# Patient Record
Sex: Female | Born: 1950
Health system: Southern US, Community
[De-identification: ages and names within clinical notes are randomized; demographics above are authoritative.]

## PROBLEM LIST (undated history)

## (undated) DIAGNOSIS — F32A Depression, unspecified: Secondary | ICD-10-CM

## (undated) DIAGNOSIS — Z87898 Personal history of other specified conditions: Secondary | ICD-10-CM

## (undated) DIAGNOSIS — T7840XA Allergy, unspecified, initial encounter: Secondary | ICD-10-CM

## (undated) DIAGNOSIS — D693 Immune thrombocytopenic purpura: Secondary | ICD-10-CM

## (undated) DIAGNOSIS — L508 Other urticaria: Secondary | ICD-10-CM

## (undated) DIAGNOSIS — I10 Essential (primary) hypertension: Secondary | ICD-10-CM

## (undated) DIAGNOSIS — F329 Major depressive disorder, single episode, unspecified: Secondary | ICD-10-CM

## (undated) DIAGNOSIS — S0990XA Unspecified injury of head, initial encounter: Secondary | ICD-10-CM

## (undated) HISTORY — DX: Immune thrombocytopenic purpura: D69.3

## (undated) HISTORY — PX: CATARACT EXTRACTION, BILATERAL: SHX1313

## (undated) HISTORY — DX: Unspecified injury of head, initial encounter: S09.90XA

## (undated) HISTORY — DX: Personal history of other specified conditions: Z87.898

## (undated) HISTORY — DX: Essential (primary) hypertension: I10

## (undated) HISTORY — DX: Allergy, unspecified, initial encounter: T78.40XA

---
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
## 1994-01-15 HISTORY — PX: CYST EXCISION: SHX5701

## 2002-01-15 DIAGNOSIS — F39 Unspecified mood [affective] disorder: Secondary | ICD-10-CM | POA: Insufficient documentation

## 2003-01-16 HISTORY — PX: CHOLECYSTECTOMY: SHX55

## 2003-05-20 ENCOUNTER — Other Ambulatory Visit: Payer: Self-pay

## 2003-11-23 ENCOUNTER — Ambulatory Visit: Payer: Self-pay | Admitting: Internal Medicine

## 2003-12-16 ENCOUNTER — Ambulatory Visit: Payer: Self-pay | Admitting: Internal Medicine

## 2004-01-16 DIAGNOSIS — I10 Essential (primary) hypertension: Secondary | ICD-10-CM | POA: Insufficient documentation

## 2004-10-16 ENCOUNTER — Ambulatory Visit: Payer: Self-pay | Admitting: Family Medicine

## 2004-10-24 DIAGNOSIS — G4733 Obstructive sleep apnea (adult) (pediatric): Secondary | ICD-10-CM | POA: Insufficient documentation

## 2005-01-15 DIAGNOSIS — Z87898 Personal history of other specified conditions: Secondary | ICD-10-CM

## 2005-01-15 HISTORY — DX: Personal history of other specified conditions: Z87.898

## 2005-02-23 ENCOUNTER — Ambulatory Visit: Payer: Self-pay | Admitting: Internal Medicine

## 2005-03-15 ENCOUNTER — Ambulatory Visit: Payer: Self-pay | Admitting: Internal Medicine

## 2005-10-08 DIAGNOSIS — D693 Immune thrombocytopenic purpura: Secondary | ICD-10-CM | POA: Insufficient documentation

## 2005-10-08 DIAGNOSIS — D696 Thrombocytopenia, unspecified: Secondary | ICD-10-CM | POA: Insufficient documentation

## 2005-10-08 DIAGNOSIS — N959 Unspecified menopausal and perimenopausal disorder: Secondary | ICD-10-CM | POA: Insufficient documentation

## 2005-10-08 DIAGNOSIS — R000 Tachycardia, unspecified: Secondary | ICD-10-CM | POA: Insufficient documentation

## 2007-01-21 DIAGNOSIS — Z8249 Family history of ischemic heart disease and other diseases of the circulatory system: Secondary | ICD-10-CM | POA: Insufficient documentation

## 2007-01-25 DIAGNOSIS — M19049 Primary osteoarthritis, unspecified hand: Secondary | ICD-10-CM | POA: Insufficient documentation

## 2007-03-14 ENCOUNTER — Ambulatory Visit: Payer: Self-pay | Admitting: Internal Medicine

## 2007-06-11 DIAGNOSIS — E785 Hyperlipidemia, unspecified: Secondary | ICD-10-CM | POA: Insufficient documentation

## 2007-06-11 DIAGNOSIS — E881 Lipodystrophy, not elsewhere classified: Secondary | ICD-10-CM | POA: Insufficient documentation

## 2007-07-08 DIAGNOSIS — K120 Recurrent oral aphthae: Secondary | ICD-10-CM | POA: Insufficient documentation

## 2007-12-26 ENCOUNTER — Ambulatory Visit: Payer: Self-pay | Admitting: Family Medicine

## 2014-06-18 ENCOUNTER — Other Ambulatory Visit: Payer: Self-pay | Admitting: Family Medicine

## 2014-06-18 ENCOUNTER — Telehealth: Payer: Self-pay | Admitting: Family Medicine

## 2014-06-19 NOTE — Telephone Encounter (Signed)
Simvastatin ordered.

## 2014-06-29 ENCOUNTER — Encounter: Payer: Self-pay | Admitting: Family Medicine

## 2014-06-29 ENCOUNTER — Ambulatory Visit (INDEPENDENT_AMBULATORY_CARE_PROVIDER_SITE_OTHER): Payer: Commercial Managed Care - PPO | Admitting: Family Medicine

## 2014-06-29 VITALS — BP 120/70 | HR 78 | Temp 98.5°F | Resp 16 | Wt 163.0 lb

## 2014-06-29 DIAGNOSIS — G4700 Insomnia, unspecified: Secondary | ICD-10-CM | POA: Insufficient documentation

## 2014-06-29 DIAGNOSIS — M26629 Arthralgia of temporomandibular joint, unspecified side: Secondary | ICD-10-CM

## 2014-06-29 DIAGNOSIS — M2662 Arthralgia of temporomandibular joint: Secondary | ICD-10-CM

## 2014-06-29 DIAGNOSIS — J309 Allergic rhinitis, unspecified: Secondary | ICD-10-CM | POA: Insufficient documentation

## 2014-06-29 DIAGNOSIS — R609 Edema, unspecified: Secondary | ICD-10-CM | POA: Diagnosis not present

## 2014-06-29 DIAGNOSIS — D696 Thrombocytopenia, unspecified: Secondary | ICD-10-CM | POA: Diagnosis not present

## 2014-06-29 DIAGNOSIS — J302 Other seasonal allergic rhinitis: Secondary | ICD-10-CM | POA: Insufficient documentation

## 2014-06-29 NOTE — Progress Notes (Signed)
Patient: Julia Garner Female    DOB: [birth year]   64 y.o.   MRN: [id number] Visit Date: 06/29/2014  Today's Provider: Mila Merry, MD   Chief Complaint  Patient presents with  . Jaw Pain    x 5 months   Subjective:    HPI    Jaw Pain  Patient comes in today for an evaluation of Left side jaw pain. Patient states it has been occuring persistently for the past 5 months. Patient has been using Naproxen 500mg   2 pills twice daily to help with pain with little relief. Patient states she was previously seen by Anola Gurney PA- C in the past for the same jaw pain. She was also prescribed muscle relaxants which she states provide minimal relief and just make her sleepy. She has been to see her dentist and had Xrays with no abnormal findings by her report.    Swelling She reports her left lower leg has been swollen and discolored for several weeks, and has been very itchy.      Previous Medications   BIOFLAVONOID PRODUCTS (VITAMIN C PLUS) 500 MG TABS    Take 1 tablet by mouth daily.   GLUCOSAMINE HCL 500 MG TABS    Take 1 tablet by mouth daily.   IRBESARTAN-HYDROCHLOROTHIAZIDE (AVALIDE) 150-12.5 MG PER TABLET    Take 1 tablet by mouth daily.   LORATADINE-PSEUDOEPHEDRINE (CLARITIN-D 24-HOUR) 10-240 MG PER 24 HR TABLET    Take 1 tablet by mouth daily.   NAPROXEN (NAPROSYN) 500 MG TABLET    Take 1 tablet by mouth 2 (two) times daily.   OMEGA 3 1200 MG CAPS    Take 2 capsules by mouth daily.   SERTRALINE (ZOLOFT) 50 MG TABLET    Take 1.5 tablets by mouth every morning.   SIMVASTATIN (ZOCOR) 40 MG TABLET    TAKE ONE TABLET AT BEDTIME   TIZANIDINE (ZANAFLEX) 4 MG TABLET    Take 1 tablet by mouth every 6 (six) hours as needed.   TRAZODONE (DESYREL) 50 MG TABLET    Take 1-2 tablets by mouth at bedtime.    Review of Systems  Constitutional: Negative for fever and chills.  Respiratory: Negative for apnea, cough and shortness of breath.   Cardiovascular: Positive for leg swelling (right  leg).  Skin: Positive for color change (redness on the lower left leg).    History  Substance Use Topics  . Smoking status: Never Smoker   . Smokeless tobacco: Not on file  . Alcohol Use: No   Objective:   BP 120/70 mmHg  Pulse 78  Temp(Src) 98.5 F (36.9 C) (Oral)  Resp 16  Wt 163 lb (73.936 kg)  SpO2 98%  Physical Exam   General Appearance:    Alert, cooperative, no distress  ENT   Tender left TMJ pre-aurical   Eyes:    PERRL, conjunctiva/corneas clear, EOM's intact       Lungs:     Clear to auscultation bilaterally, respirations unlabored  Heart:    Regular rate and rhythm  Neurologic:   Awake, alert, oriented x 3. No apparent focal neurological           defect.   Skin   About 5cm by 12cm area of left anterior left with redish blue discoloration, fairly well demarcated with 1+ edema. No red streaks. Minimal tenderness. A few excoriations.        Assessment & Plan:     1. Thrombocytopenia She reports having had more bruising than  usual the last few months.  - CBC  2. Arthralgia of temporomandibular joint No significant relief from NSAIDs and muscle relaxers. She is interested in getting injections. Will refer to ENT for further evaluation.  - Ambulatory referral to ENT  3. Edema Unclear is this is a purely cutaneous finding, or a sign of a systemic condition. Check labs. Consider steroid cream if labs normal.  - TSH - Comprehensive metabolic panel

## 2014-06-30 LAB — CBC
HEMATOCRIT: 38.8 % (ref 34.0–46.6)
HEMOGLOBIN: 12.9 g/dL (ref 11.1–15.9)
MCH: 28.2 pg (ref 26.6–33.0)
MCHC: 33.2 g/dL (ref 31.5–35.7)
MCV: 85 fL (ref 79–97)
Platelets: 47 10*3/uL — CL (ref 150–379)
RBC: 4.58 x10E6/uL (ref 3.77–5.28)
RDW: 13.2 % (ref 12.3–15.4)
WBC: 8 10*3/uL (ref 3.4–10.8)

## 2014-06-30 LAB — COMPREHENSIVE METABOLIC PANEL
ALK PHOS: 59 IU/L (ref 39–117)
ALT: 21 IU/L (ref 0–32)
AST: 22 IU/L (ref 0–40)
Albumin/Globulin Ratio: 1.2 (ref 1.1–2.5)
Albumin: 4.1 g/dL (ref 3.6–4.8)
BUN/Creatinine Ratio: 14 (ref 11–26)
BUN: 11 mg/dL (ref 8–27)
Bilirubin Total: 0.3 mg/dL (ref 0.0–1.2)
CO2: 25 mmol/L (ref 18–29)
CREATININE: 0.76 mg/dL (ref 0.57–1.00)
Calcium: 9.1 mg/dL (ref 8.7–10.3)
Chloride: 100 mmol/L (ref 97–108)
GFR, EST AFRICAN AMERICAN: 98 mL/min/{1.73_m2} (ref >59)
GFR, EST NON AFRICAN AMERICAN: 85 mL/min/{1.73_m2} (ref >59)
GLOBULIN, TOTAL: 3.5 g/dL (ref 1.5–4.5)
Glucose: 106 mg/dL — ABNORMAL HIGH (ref 65–99)
POTASSIUM: 3.9 mmol/L (ref 3.5–5.2)
SODIUM: 139 mmol/L (ref 134–144)
Total Protein: 7.6 g/dL (ref 6.0–8.5)

## 2014-06-30 LAB — TSH: TSH: 1.66 u[IU]/mL (ref 0.450–4.500)

## 2014-07-01 ENCOUNTER — Telehealth: Payer: Self-pay | Admitting: *Deleted

## 2014-07-01 MED ORDER — PREDNISONE 10 MG PO TABS: ORAL_TABLET | ORAL | Status: DC

## 2014-07-01 NOTE — Telephone Encounter (Signed)
-----   Message from Malva Limes, MD sent at 06/30/2014  7:52 AM EDT ----- Platelets are pretty low at 47. Recommend she start back on 12 day prednisone taper. (10mg  tablets, 6 for 2 days, 5 for 2 days, 4 for 2 days, 3 for 2days, 2 for 2 days, and 1 for 2 days. #42) Other labs are not yet complete.

## 2014-07-01 NOTE — Telephone Encounter (Signed)
Rx sent to pharmacy. Patient wanted to know if you could give her a cream for her leg also?

## 2014-07-04 NOTE — Telephone Encounter (Signed)
i don't know of any creams that would help her leg. It should clear up on its own in a week or two. If not then she will need referral to dermatology.

## 2014-07-05 NOTE — Telephone Encounter (Signed)
Patient notified. Patient stated that the ENT she was referred to told her that they can't see her for jaw pain. ENT would have to refer her to someone else for the jaw pain. Patient stated that she would rather [facility] refer her to someone for her jaw instead of ENT.

## 2014-07-05 NOTE — Telephone Encounter (Signed)
LMOVM for pt to return call 

## 2014-07-06 NOTE — Telephone Encounter (Signed)
i don't know anyone else to refer to. She needs to find out from ENT best course of action. They will need to arrange referral if that is what is necessary.

## 2014-07-06 NOTE — Telephone Encounter (Signed)
Pt advised as directed below.   Thanks,   -Gracey Tolle  

## 2014-12-21 ENCOUNTER — Other Ambulatory Visit: Payer: Self-pay | Admitting: Family Medicine

## 2014-12-22 ENCOUNTER — Ambulatory Visit (INDEPENDENT_AMBULATORY_CARE_PROVIDER_SITE_OTHER): Payer: Commercial Managed Care - PPO

## 2014-12-22 DIAGNOSIS — Z23 Encounter for immunization: Secondary | ICD-10-CM

## 2015-03-08 ENCOUNTER — Other Ambulatory Visit: Payer: Self-pay | Admitting: Family Medicine

## 2015-03-21 ENCOUNTER — Other Ambulatory Visit: Payer: Self-pay | Admitting: Family Medicine

## 2015-09-24 ENCOUNTER — Other Ambulatory Visit: Payer: Self-pay | Admitting: Family Medicine

## 2015-12-16 ENCOUNTER — Encounter: Payer: Commercial Managed Care - PPO | Admitting: Family Medicine

## 2015-12-21 ENCOUNTER — Encounter: Payer: Self-pay | Admitting: Family Medicine

## 2015-12-21 ENCOUNTER — Ambulatory Visit (INDEPENDENT_AMBULATORY_CARE_PROVIDER_SITE_OTHER): Payer: BLUE CROSS/BLUE SHIELD | Admitting: Family Medicine

## 2015-12-21 ENCOUNTER — Other Ambulatory Visit: Payer: Self-pay | Admitting: Family Medicine

## 2015-12-21 VITALS — BP 118/68 | HR 72 | Temp 98.4°F | Resp 16 | Ht 62.0 in | Wt 163.0 lb

## 2015-12-21 DIAGNOSIS — I10 Essential (primary) hypertension: Secondary | ICD-10-CM

## 2015-12-21 DIAGNOSIS — E785 Hyperlipidemia, unspecified: Secondary | ICD-10-CM | POA: Diagnosis not present

## 2015-12-21 DIAGNOSIS — Z0000 Encounter for general adult medical examination without abnormal findings: Secondary | ICD-10-CM

## 2015-12-21 DIAGNOSIS — Z23 Encounter for immunization: Secondary | ICD-10-CM | POA: Diagnosis not present

## 2015-12-21 DIAGNOSIS — D696 Thrombocytopenia, unspecified: Secondary | ICD-10-CM | POA: Diagnosis not present

## 2015-12-21 DIAGNOSIS — F32A Depression, unspecified: Secondary | ICD-10-CM

## 2015-12-21 DIAGNOSIS — F329 Major depressive disorder, single episode, unspecified: Secondary | ICD-10-CM

## 2015-12-21 MED ORDER — LORATADINE-PSEUDOEPHEDRINE ER 10-240 MG PO TB24: 1.0000 | ORAL_TABLET | Freq: Every day | ORAL | 5 refills | Status: DC | PRN

## 2015-12-21 NOTE — Progress Notes (Signed)
Patient: Julia Garner, Female    DOB: 09/13/1950, 65 y.o.   MRN: [id number] Visit Date: 12/21/2015  Today's Provider: Mila Merryonald Meyer Dockery, MD   Chief Complaint  Patient presents with  . Annual Exam   Subjective:    Annual physical exam Julia Garner is a 65 y.o. female who presents today for health maintenance and complete physical. She feels well. She reports exercising not regularly. She reports she is sleeping fairly well. She is a previous patient of Dr. Christen BameValen, but is due for breast exam and mammogram.   She feels well. 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     Hypertension, follow-up:  BP Readings from Last 3 Encounters:  12/21/15 118/68  06/29/14 120/70    She was last seen for hypertension 2 years ago.  BP at that visit was 120/70. Management since that visit includes no changes. She reports good compliance with treatment. She is not having side effects.  She is not exercising. She is adherent to low salt diet.   Outside blood pressures are not being checked. Patient denies chest pain, chest pressure/discomfort, irregular heart beat, lower extremity edema and tachypnea.   Cardiovascular risk factors include dyslipidemia.     Weight trend: stable Wt Readings from Last 3 Encounters:  12/21/15 163 lb (73.9 kg)  06/29/14 163 lb (73.9 kg)    Current diet: well balanced    Lipid/Cholesterol, Follow-up:   Last seen for this2 years ago.  Management changes since that visit include no changes. . Last Lipid Panel: No results found for: CHOL, TRIG, HDL, CHOLHDL, VLDL, LDLCALC, LDLDIRECT  Risk factors for vascular disease include hypertension  She reports good compliance with treatment. She is not having side effects.  Current symptoms include none and have been stable. Weight trend: stable Prior visit with dietician: no Current diet: well balanced Current exercise: none  Wt Readings from Last 3  Encounters:  12/21/15 163 lb (73.9 kg)  06/29/14 163 lb (73.9 kg)      Review of Systems  Constitutional: Negative.   HENT: Negative.   Eyes: Negative.   Respiratory: Negative.   Cardiovascular: Negative.   Gastrointestinal: Negative.   Endocrine: Negative.   Genitourinary: Negative.   Musculoskeletal: Negative.   Skin: Negative.   Allergic/Immunologic: Positive for environmental allergies.  Neurological: Negative.   Hematological: Negative.   Psychiatric/Behavioral: Negative.     Social History      She  reports that she has never smoked. She does not have any smokeless tobacco history on file. She reports that she does not drink alcohol or use drugs.       Social History   Social History  . Marital status: Married    Spouse name: N/A  . Number of children: 3  . Years of education: N/A   Occupational History  . Employed     Physical Therapist   Social History Main Topics  . Smoking status: Never Smoker  . Smokeless tobacco: None  . Alcohol use No  . Drug use: No  . Sexual activity: Not Asked   Other Topics Concern  . None   Social History Narrative  . None    No past medical history on file.   Patient Active Problem List   Diagnosis Date Noted  . Allergic rhinitis 06/29/2014  . Insomnia 06/29/2014  . Arthralgia of temporomandibular joint 06/29/2014  . Edema 06/29/2014  . Oral aphthae 07/08/2007  . Cutaneous lipodystrophy  06/11/2007  . Arthropathy of hand 01/25/2007  . Fam hx-ischem heart disease 01/21/2007  . Menopausal and postmenopausal disorder 10/08/2005  . Tachycardia 10/08/2005  . Thrombocytopenia (HCC) 10/08/2005  . Obstructive sleep apnea of adult 10/24/2004  . Essential (primary) hypertension 01/16/2004  . Depression 01/15/2002    Past Surgical History:  Procedure Laterality Date  . CESAREAN SECTION  1990  . CHOLECYSTECTOMY  2005  . CYST EXCISION  1996   Tracheal Cyst    Family History        Family Status  Relation Status   . Mother Deceased  . Father Deceased at age 63   Cause of death: MI        Her family history includes Heart attack in her father.     Allergies  Allergen Reactions  . Penicillins Itching    Hot flashes.  . Simcor  [Niacin-Simvastatin Er]     Flushing  . Tylenol  [Acetaminophen]      Current Outpatient Prescriptions:  .  Bioflavonoid Products (VITAMIN C PLUS) 500 MG TABS, Take 1 tablet by mouth daily., Disp: , Rfl:  .  Glucosamine HCl 500 MG TABS, Take 1 tablet by mouth daily., Disp: , Rfl:  .  irbesartan-hydrochlorothiazide (AVALIDE) 150-12.5 MG tablet, TAKE 1 TABLET EVERY DAY, Disp: 90 tablet, Rfl: 5 .  loratadine-pseudoephedrine (CLARITIN-D 24-HOUR) 10-240 MG per 24 hr tablet, Take 1 tablet by mouth daily., Disp: , Rfl:  .  Omega 3 1200 MG CAPS, Take 2 capsules by mouth daily., Disp: , Rfl:  .  sertraline (ZOLOFT) 50 MG tablet, TAKE ONE AND ONE-HALF TABLETS EVERY MORNING, Disp: 135 tablet, Rfl: 6 .  simvastatin (ZOCOR) 40 MG tablet, TAKE ONE TABLET AT BEDTIME, Disp: 90 tablet, Rfl: 4 .  naproxen (NAPROSYN) 500 MG tablet, Take 1 tablet by mouth 2 (two) times daily., Disp: , Rfl:  .  traZODone (DESYREL) 50 MG tablet, TAKE ONE OR TWO TABLETS AT BEDTIME, Disp: 180 tablet, Rfl: 4   Patient Care Team: Malva Limesonald E Tersea Aulds, MD as PCP - General (Family Medicine)      Objective:   Vitals: BP 118/68 (BP Location: Left Arm, Patient Position: Sitting, Cuff Size: Normal)   Pulse 72   Temp 98.4 F (36.9 C)   Resp 16   Ht 5\' 2"  (1.575 m)   Wt 163 lb (73.9 kg)   BMI 29.81 kg/m    Physical Exam   General Appearance:    Alert, cooperative, no distress, appears stated age  Head:    Normocephalic, without obvious abnormality, atraumatic  Eyes:    PERRL, conjunctiva/corneas clear, EOM's intact, fundi    benign, both eyes  Ears:    Normal TM's and external ear canals, both ears  Nose:   Nares normal, septum midline, mucosa normal, no drainage    or sinus tenderness  Throat:   Lips,  mucosa, and tongue normal; teeth and gums normal  Neck:   Supple, symmetrical, trachea midline, no adenopathy;    thyroid:  no enlargement/tenderness/nodules; no carotid   bruit or JVD  Back:     Symmetric, no curvature, ROM normal, no CVA tenderness  Lungs:     Clear to auscultation bilaterally, respirations unlabored  Chest Wall:    No tenderness or deformity   Heart:    Regular rate and rhythm, S1 and S2 normal, no murmur, rub   or gallop  Breast Exam:    refused  Abdomen:     Soft, non-tender, bowel sounds active all  four quadrants,    no masses, no organomegaly  Pelvic:    deferred  Extremities:   Extremities normal, atraumatic, no cyanosis or edema  Pulses:   2+ and symmetric all extremities  Skin:   Skin color, texture, turgor normal, no rashes or lesions  Lymph nodes:   Cervical, supraclavicular, and axillary nodes normal  Neurologic:   CNII-XII intact, normal strength, sensation and reflexes    throughout    Depression Screen No flowsheet data found.    Assessment & Plan:     Routine Health Maintenance and Physical Exam  Exercise Activities and Dietary recommendations Goals    None      Immunization History  Administered Date(s) Administered  . Influenza,inj,Quad PF,36+ Mos 12/22/2014  . Tdap 05/08/2011    Health Maintenance  Topic Date Due  . Hepatitis C Screening  1950-04-12  . HIV Screening  07/18/1967  . PAP SMEAR  07/17/1973  . MAMMOGRAM  07/18/2002  . COLONOSCOPY  07/18/2002  . ZOSTAVAX  07/17/2012  . INFLUENZA VACCINE  08/16/2015  . TETANUS/TDAP  05/07/2021     Discussed health benefits of physical activity, and encouraged her to engage in regular exercise appropriate for her age and condition.    1. Annual physical exam She is generally doing well with no complaints. Detailed discussion on benefits of routine screenings. She refused breast exam, mammogram, colonoscopy and fecal occult blood testing today.  - Comprehensive metabolic panel -  Lipid panel - CBC  2. Need for influenza vaccination  - Flu Vaccine QUAD 36+ mos PF IM (Fluarix & Fluzone Quad PF)  3. Essential (primary) hypertension Well controlled.  Continue current medications.      4. Thrombocytopenia (HCC)  - CBC  5. Depression, unspecified depression type Doing well on sertraline.   6. Hyperlipidemia, unspecified hyperlipidemia type She is tolerating simvastatin well with no adverse effects.   - Lipid panel   Mila Merry, MD  [facility]

## 2015-12-22 ENCOUNTER — Encounter: Payer: Self-pay | Admitting: Family Medicine

## 2015-12-22 LAB — COMPREHENSIVE METABOLIC PANEL
ALBUMIN: 4.1 g/dL (ref 3.6–4.8)
ALT: 28 IU/L (ref 0–32)
AST: 24 IU/L (ref 0–40)
Albumin/Globulin Ratio: 1 — ABNORMAL LOW (ref 1.2–2.2)
Alkaline Phosphatase: 77 IU/L (ref 39–117)
BUN / CREAT RATIO: 11 — AB (ref 12–28)
BUN: 10 mg/dL (ref 8–27)
Bilirubin Total: 0.4 mg/dL (ref 0.0–1.2)
CALCIUM: 9.4 mg/dL (ref 8.7–10.3)
CO2: 28 mmol/L (ref 18–29)
Chloride: 100 mmol/L (ref 96–106)
Creatinine, Ser: 0.9 mg/dL (ref 0.57–1.00)
GFR, EST AFRICAN AMERICAN: 79 mL/min/{1.73_m2} (ref >59)
GFR, EST NON AFRICAN AMERICAN: 68 mL/min/{1.73_m2} (ref >59)
Globulin, Total: 4.1 g/dL (ref 1.5–4.5)
Glucose: 82 mg/dL (ref 65–99)
POTASSIUM: 3.9 mmol/L (ref 3.5–5.2)
Sodium: 144 mmol/L (ref 134–144)
TOTAL PROTEIN: 8.2 g/dL (ref 6.0–8.5)

## 2015-12-22 LAB — CBC
Hematocrit: 41.8 % (ref 34.0–46.6)
Hemoglobin: 14.2 g/dL (ref 11.1–15.9)
MCH: 28.5 pg (ref 26.6–33.0)
MCHC: 34 g/dL (ref 31.5–35.7)
MCV: 84 fL (ref 79–97)
PLATELETS: 86 10*3/uL — AB (ref 150–379)
RBC: 4.99 x10E6/uL (ref 3.77–5.28)
RDW: 13.7 % (ref 12.3–15.4)
WBC: 9 10*3/uL (ref 3.4–10.8)

## 2015-12-22 LAB — LIPID PANEL
CHOL/HDL RATIO: 3.4 ratio (ref 0.0–4.4)
Cholesterol, Total: 131 mg/dL (ref 100–199)
HDL: 38 mg/dL — AB (ref >39)
LDL Calculated: 64 mg/dL (ref 0–99)
Triglycerides: 144 mg/dL (ref 0–149)
VLDL CHOLESTEROL CAL: 29 mg/dL (ref 5–40)

## 2016-01-17 ENCOUNTER — Telehealth: Payer: Self-pay | Admitting: Family Medicine

## 2016-03-20 ENCOUNTER — Other Ambulatory Visit: Payer: Self-pay | Admitting: Family Medicine

## 2016-04-04 ENCOUNTER — Encounter: Payer: Self-pay | Admitting: Family Medicine

## 2016-04-04 ENCOUNTER — Ambulatory Visit (INDEPENDENT_AMBULATORY_CARE_PROVIDER_SITE_OTHER): Payer: BLUE CROSS/BLUE SHIELD | Admitting: Family Medicine

## 2016-04-04 VITALS — BP 112/68 | HR 98 | Temp 98.2°F | Resp 16 | Wt 170.0 lb

## 2016-04-04 DIAGNOSIS — J301 Allergic rhinitis due to pollen: Secondary | ICD-10-CM

## 2016-04-04 MED ORDER — AZELASTINE HCL 0.1 % NA SOLN: 2.0000 | Freq: Two times a day (BID) | NASAL | 5 refills | Status: DC

## 2016-04-04 MED ORDER — MONTELUKAST SODIUM 10 MG PO TABS: 10.0000 mg | ORAL_TABLET | Freq: Every day | ORAL | 5 refills | Status: DC

## 2016-04-04 NOTE — Progress Notes (Signed)
Patient: Julia Garner Female    DOB: 1950-01-26   66 y.o.   MRN: [id number] Visit Date: 04/04/2016  Today's Provider: Mila Merry, MD   Chief Complaint  Patient presents with  . Sinus Problem   Subjective:    HPI Sinus problem: Patient comes in today reporting that her allergies have flared up. She states that her left eye has been itchy and watery. She has also had runny nose. She has been taking Claritin and Claritin -D which has helped to keep symptoms controlled. Patient states that she would like something else to help alleviate symptoms since the seasons are starting to change.   Allergies  Allergen Reactions  . Penicillins Itching    Hot flashes.  . Simcor  [Niacin-Simvastatin Er]     Flushing  . Tylenol  [Acetaminophen]      Current Outpatient Prescriptions:  .  Bioflavonoid Products (VITAMIN C PLUS) 500 MG TABS, Take 1 tablet by mouth daily., Disp: , Rfl:  .  Calcium-Magnesium-Vitamin D (CALCIUM 500 PO), Take 1 tablet by mouth daily., Disp: , Rfl:  .  irbesartan-hydrochlorothiazide (AVALIDE) 150-12.5 MG tablet, TAKE 1 TABLET EVERY DAY, Disp: 90 tablet, Rfl: 5 .  loratadine-pseudoephedrine (CLARITIN-D 24-HOUR) 10-240 MG 24 hr tablet, Take 1 tablet by mouth daily as needed for allergies., Disp: 30 tablet, Rfl: 5 .  naproxen (NAPROSYN) 500 MG tablet, Take 1 tablet by mouth 2 (two) times daily., Disp: , Rfl:  .  Omega 3 1200 MG CAPS, Take 2 capsules by mouth daily., Disp: , Rfl:  .  sertraline (ZOLOFT) 50 MG tablet, TAKE 1 AND 1/2 TABLETS BY MOUTH EVERY MORNING, Disp: 135 tablet, Rfl: 2 .  simvastatin (ZOCOR) 40 MG tablet, TAKE ONE TABLET AT BEDTIME, Disp: 90 tablet, Rfl: 4 .  traZODone (DESYREL) 50 MG tablet, TAKE ONE OR TWO TABLETS AT BEDTIME, Disp: 180 tablet, Rfl: 4 .  Glucosamine HCl 500 MG TABS, Take 1 tablet by mouth daily., Disp: , Rfl:   Review of Systems  Constitutional: Negative for appetite change, chills, fatigue and fever.  HENT: Positive for  rhinorrhea. Negative for congestion, sinus pain, sinus pressure, sneezing and sore throat.   Eyes: Positive for discharge, redness and itching. Negative for photophobia, pain and visual disturbance.  Respiratory: Negative for chest tightness and shortness of breath.   Cardiovascular: Negative for chest pain and palpitations.  Gastrointestinal: Negative for abdominal pain, nausea and vomiting.  Neurological: Negative for dizziness and weakness.    Social History  Substance Use Topics  . Smoking status: Never Smoker  . Smokeless tobacco: Never Used  . Alcohol use No   Objective:   BP 112/68 (BP Location: Left Arm, Patient Position: Sitting, Cuff Size: Large)   Pulse 98   Temp 98.2 F (36.8 C) (Oral)   Resp 16   Wt 170 lb (77.1 kg)   SpO2 95% Comment: room air  BMI 31.09 kg/m  Vitals:   04/04/16 1539  Weight: 170 lb (77.1 kg)     Physical Exam  General Appearance:    Alert, cooperative, no distress  HENT:   bilateral TM normal without fluid or infection, neck without nodes, sinuses nontender, post nasal drip noted and nasal mucosa pale and congested  Eyes:    PERRL, conjunctiva/corneas clear, EOM's intact       Lungs:     Clear to auscultation bilaterally, respirations unlabored  Heart:    Regular rate and rhythm  Neurologic:   Awake, alert, oriented  x 3. No apparent focal neurological           defect.            Assessment & Plan:     1. Acute seasonal allergic rhinitis due to pollen Add azelastine prn to Singular and OTC antihistamin.  - montelukast (SINGULAIR) 10 MG tablet; Take 1 tablet (10 mg total) by mouth at bedtime.  Dispense: 30 tablet; Refill: 5 - azelastine (ASTELIN) 0.1 % nasal spray; Place 2 sprays into both nostrils 2 (two) times daily. Use in each nostril as directed  Dispense: 30 mL; Refill: 5       Mila Merry, MD  [facility]

## 2016-05-14 ENCOUNTER — Encounter: Payer: Self-pay | Admitting: Family Medicine

## 2016-05-14 ENCOUNTER — Ambulatory Visit (INDEPENDENT_AMBULATORY_CARE_PROVIDER_SITE_OTHER): Payer: BLUE CROSS/BLUE SHIELD | Admitting: Family Medicine

## 2016-05-14 VITALS — BP 124/74 | HR 83 | Temp 98.0°F | Resp 16 | Ht 62.0 in | Wt 171.0 lb

## 2016-05-14 DIAGNOSIS — F39 Unspecified mood [affective] disorder: Secondary | ICD-10-CM | POA: Diagnosis not present

## 2016-05-14 MED ORDER — LORAZEPAM 0.5 MG PO TABS: 0.2500 mg | ORAL_TABLET | Freq: Two times a day (BID) | ORAL | 0 refills | Status: DC | PRN

## 2016-05-14 NOTE — Progress Notes (Signed)
Patient: Julia Garner Female    DOB: 23-Jul-1950   66 y.o.   MRN: [id number] Visit Date: 05/14/2016  Today's Provider: Mila Merry, MD   Chief Complaint  Patient presents with  . Medication Management   Subjective:       Patient states that over the years she will occasionally get very irritable and angry for no apparent reason. She states that she was prescribed lorazepam many years ago, but episodes went away for a long time. However she had one a few weeks ago and had a hard time getting calmed down. She would like to get  Prescription of lorazepam to have in case she has another similar episode.      Allergies  Allergen Reactions  . Penicillins Itching    Hot flashes.  . Simcor  [Niacin-Simvastatin Er]     Flushing  . Tylenol  [Acetaminophen]      Current Outpatient Prescriptions:  .  azelastine (ASTELIN) 0.1 % nasal spray, Place 2 sprays into both nostrils 2 (two) times daily. Use in each nostril as directed, Disp: 30 mL, Rfl: 5 .  Bioflavonoid Products (VITAMIN C PLUS) 500 MG TABS, Take 1 tablet by mouth daily., Disp: , Rfl:  .  Calcium-Magnesium-Vitamin D (CALCIUM 500 PO), Take 1 tablet by mouth daily., Disp: , Rfl:  .  Glucosamine HCl 500 MG TABS, Take 1 tablet by mouth daily., Disp: , Rfl:  .  irbesartan-hydrochlorothiazide (AVALIDE) 150-12.5 MG tablet, TAKE 1 TABLET EVERY DAY, Disp: 90 tablet, Rfl: 5 .  loratadine-pseudoephedrine (CLARITIN-D 24-HOUR) 10-240 MG 24 hr tablet, Take 1 tablet by mouth daily as needed for allergies., Disp: 30 tablet, Rfl: 5 .  montelukast (SINGULAIR) 10 MG tablet, Take 1 tablet (10 mg total) by mouth at bedtime., Disp: 30 tablet, Rfl: 5 .  naproxen (NAPROSYN) 500 MG tablet, Take 1 tablet by mouth 2 (two) times daily., Disp: , Rfl:  .  naproxen sodium (ANAPROX) 220 MG tablet, Take by mouth as needed., Disp: , Rfl:  .  Omega 3 1200 MG CAPS, Take 2 capsules by mouth daily., Disp: , Rfl:  .  sertraline (ZOLOFT) 50 MG tablet, TAKE 1  AND 1/2 TABLETS BY MOUTH EVERY MORNING, Disp: 135 tablet, Rfl: 2 .  simvastatin (ZOCOR) 40 MG tablet, TAKE ONE TABLET AT BEDTIME, Disp: 90 tablet, Rfl: 4 .  traZODone (DESYREL) 50 MG tablet, TAKE ONE OR TWO TABLETS AT BEDTIME, Disp: 180 tablet, Rfl: 4  Review of Systems  Constitutional: Negative for appetite change, chills, fatigue and fever.  Respiratory: Negative for chest tightness and shortness of breath.   Cardiovascular: Negative for chest pain and palpitations.  Gastrointestinal: Negative for abdominal pain, nausea and vomiting.  Neurological: Negative for dizziness and weakness.    Social History  Substance Use Topics  . Smoking status: Never Smoker  . Smokeless tobacco: Never Used  . Alcohol use No   Objective:   BP 124/74 (BP Location: Right Arm, Patient Position: Sitting, Cuff Size: Large)   Pulse 83   Temp 98 F (36.7 C) (Oral)   Resp 16   Ht  (1.575 m)   Wt 171 lb (77.6 kg)   SpO2 99%   BMI 31.28 kg/m  Vitals:   05/14/16 1614  BP: 124/74  Pulse: 83  Resp: 16  Temp: 98 F (36.7 C)  TempSrc: Oral  SpO2: 99%  Weight: 171 lb (77.6 kg)  Height:  (1.575 m)     Physical Exam  General appearance: alert, well developed, well nourished, cooperative and in no distress Head: Normocephalic, without obvious abnormality, atraumatic Respiratory: Respirations even and unlabored, normal respiratory rate Extremities: No gross deformities Skin: Skin color, texture, turgor normal. No rashes seen  Psych: Appropriate mood and affect. Neurologic: Mental status: Alert, oriented to person, place, and time, thought content appropriate.     Assessment & Plan:     1. Mood disorder (HCC) Infrequent episodes, but did well with lorazepam in the past.  - LORazepam (ATIVAN) 0.5 MG tablet; Take 0.5-1 tablets (0.25-0.5 mg total) by mouth 2 (two) times daily as needed.  Dispense: 12 tablet; Refill: 0       Mila Merry, MD  [facility]

## 2016-06-18 ENCOUNTER — Other Ambulatory Visit: Payer: Self-pay | Admitting: Family Medicine

## 2016-06-18 DIAGNOSIS — J301 Allergic rhinitis due to pollen: Secondary | ICD-10-CM

## 2016-06-18 MED ORDER — MONTELUKAST SODIUM 10 MG PO TABS: 10.0000 mg | ORAL_TABLET | Freq: Every day | ORAL | 3 refills | Status: DC

## 2016-06-18 NOTE — Telephone Encounter (Signed)
Misty with [facility] contacted office for refill request on the following medications: montelukast (SINGULAIR) 10 MG tablet 90 day supply Last Rx: 04/04/16 30 day supply with 3 refills Misty stated that pt would like to start getting a 90 day supply. Please advise. Thanks TNP

## 2016-06-26 ENCOUNTER — Other Ambulatory Visit: Payer: Self-pay | Admitting: Family Medicine

## 2016-08-20 ENCOUNTER — Telehealth: Payer: Self-pay | Admitting: Family Medicine

## 2016-08-20 ENCOUNTER — Other Ambulatory Visit: Payer: Self-pay | Admitting: Family Medicine

## 2016-08-20 DIAGNOSIS — D696 Thrombocytopenia, unspecified: Secondary | ICD-10-CM

## 2016-08-20 NOTE — Telephone Encounter (Signed)
Pt needs order to have her platelets checked.  please call patient when the order is ready  Her call back [phone]   Thanks teri

## 2016-08-20 NOTE — Telephone Encounter (Signed)
Pease advise.

## 2016-08-21 NOTE — Telephone Encounter (Signed)
Patient was notified lap slip is ready to pick-up.

## 2016-08-23 ENCOUNTER — Other Ambulatory Visit: Payer: Self-pay | Admitting: Family Medicine

## 2016-08-23 ENCOUNTER — Telehealth: Payer: Self-pay | Admitting: Family Medicine

## 2016-08-23 DIAGNOSIS — D696 Thrombocytopenia, unspecified: Secondary | ICD-10-CM

## 2016-08-23 DIAGNOSIS — D691 Qualitative platelet defects: Secondary | ICD-10-CM

## 2016-08-23 LAB — PLATELET COUNT: PLATELETS: 15 10*3/uL — AB (ref 150–379)

## 2016-08-23 MED ORDER — PREDNISONE 10 MG PO TABS: ORAL_TABLET | ORAL | 0 refills | Status: AC

## 2016-08-23 NOTE — Progress Notes (Unsigned)
Please schedule ASAP, she has critically low platelet count of 15 and may need platelet trasfusion

## 2016-08-23 NOTE — Telephone Encounter (Signed)
Tia wanted you to know that Ms. Julia Garner has an [facility] with Dr. Cathie HoopsYu on Aug. 22 at 2:30.

## 2016-08-23 NOTE — Telephone Encounter (Signed)
Patient needs to have CBC done on Monday to make sure platelets are responding to prednisone.

## 2016-08-23 NOTE — Telephone Encounter (Signed)
I received call from after hours nurse; platelet count of fifteen We tried patient several times; finally got through to her around 7:30 am I advised platelet count very low; could spontaneously bleed; advised her to go to ER She says this is nothing new; has seen heme; they put her on steroids for this; did not want to go to the ER I will notify Dr. Sherrie MustacheFisher when I get to the office

## 2016-08-24 NOTE — Telephone Encounter (Signed)
Patient was notified.

## 2016-08-24 NOTE — Telephone Encounter (Signed)
Yes, her platelets are much lower than usually and she can't stay on prednisone indefinites. A platelet count below 20 is considered critically low. She needs evaluation by hematologist to figure how to keep it from getting so low again.

## 2016-08-24 NOTE — Telephone Encounter (Signed)
I see note is still open I went upstairs at lunch yesterday and talked with [name] about case, explained there was a note for Dr. Sherrie MustacheFisher and asked that someone please address this issue with patient due to platelet count of 15 He agrees Staff came downstairs yesterday afternoon, told Dr. Carlynn PurlSowles that issue had been addressed I will sign off on this note and forward to Dr. Theodis AguasFisher's box

## 2016-08-24 NOTE — Telephone Encounter (Signed)
Unable to reach pt at this time, will try again later.

## 2016-08-24 NOTE — Telephone Encounter (Signed)
Patient was notified. Lab slip printed. Patient wants to know if Dr. Sherrie MustacheFisher still wants her to see Hematologist?

## 2016-08-27 ENCOUNTER — Telehealth: Payer: Self-pay | Admitting: Emergency Medicine

## 2016-08-27 ENCOUNTER — Telehealth: Payer: Self-pay | Admitting: Family Medicine

## 2016-08-27 DIAGNOSIS — D696 Thrombocytopenia, unspecified: Secondary | ICD-10-CM

## 2016-08-27 LAB — CBC
Hematocrit: 43.1 % (ref 34.0–46.6)
Hemoglobin: 14.6 g/dL (ref 11.1–15.9)
MCH: 28.7 pg (ref 26.6–33.0)
MCHC: 33.9 g/dL (ref 31.5–35.7)
MCV: 85 fL (ref 79–97)
Platelets: 120 10*3/uL — ABNORMAL LOW (ref 150–379)
RBC: 5.08 x10E6/uL (ref 3.77–5.28)
RDW: 13 % (ref 12.3–15.4)
WBC: 14.5 10*3/uL — ABNORMAL HIGH (ref 3.4–10.8)

## 2016-08-27 NOTE — Telephone Encounter (Signed)
Tried calling patient, no answer. Will try again later.  

## 2016-08-27 NOTE — Telephone Encounter (Signed)
[facility] called back with stat lab results on pt. Everything was within normal range except for WBC which was 14.5 and Platelets at 120. Thanks.

## 2016-08-27 NOTE — Telephone Encounter (Signed)
Good, please advise patient platelets are much better. Finish prednisone and be sure to follow up with hematology as scheduled.

## 2016-08-27 NOTE — Telephone Encounter (Signed)
Lab order only 

## 2016-08-28 NOTE — Telephone Encounter (Signed)
Patient was notified of results.  

## 2016-09-05 ENCOUNTER — Ambulatory Visit: Payer: Self-pay | Admitting: Oncology

## 2016-09-07 ENCOUNTER — Telehealth: Payer: Self-pay | Admitting: Oncology

## 2016-09-07 NOTE — Telephone Encounter (Signed)
Move appt time from 10:00 am to 8:30 am for 30 mins, per Dr Cathie Hoops. (verbal)  New appt time conf with pt. MF 09/07/16

## 2016-09-10 ENCOUNTER — Inpatient Hospital Stay: Payer: BLUE CROSS/BLUE SHIELD | Attending: Oncology | Admitting: Oncology

## 2016-09-10 ENCOUNTER — Inpatient Hospital Stay: Payer: BLUE CROSS/BLUE SHIELD

## 2016-09-10 ENCOUNTER — Encounter: Payer: Self-pay | Admitting: Oncology

## 2016-09-10 ENCOUNTER — Inpatient Hospital Stay: Payer: BLUE CROSS/BLUE SHIELD | Admitting: Oncology

## 2016-09-10 VITALS — BP 107/71 | HR 83 | Temp 97.8°F | Resp 16 | Ht 61.25 in | Wt 166.1 lb

## 2016-09-10 DIAGNOSIS — Z862 Personal history of diseases of the blood and blood-forming organs and certain disorders involving the immune mechanism: Secondary | ICD-10-CM | POA: Diagnosis not present

## 2016-09-10 DIAGNOSIS — D72829 Elevated white blood cell count, unspecified: Secondary | ICD-10-CM | POA: Diagnosis not present

## 2016-09-10 DIAGNOSIS — D696 Thrombocytopenia, unspecified: Secondary | ICD-10-CM | POA: Diagnosis not present

## 2016-09-10 DIAGNOSIS — Z79899 Other long term (current) drug therapy: Secondary | ICD-10-CM

## 2016-09-10 DIAGNOSIS — Z880 Allergy status to penicillin: Secondary | ICD-10-CM | POA: Diagnosis not present

## 2016-09-10 DIAGNOSIS — Z7952 Long term (current) use of systemic steroids: Secondary | ICD-10-CM | POA: Diagnosis not present

## 2016-09-10 DIAGNOSIS — I10 Essential (primary) hypertension: Secondary | ICD-10-CM | POA: Diagnosis not present

## 2016-09-10 LAB — CBC WITH DIFFERENTIAL/PLATELET
Basophils Absolute: 0.1 10*3/uL (ref 0–0.1)
Basophils Relative: 1 %
EOS ABS: 0.4 10*3/uL (ref 0–0.7)
EOS PCT: 5 %
HCT: 39.4 % (ref 35.0–47.0)
Hemoglobin: 13.6 g/dL (ref 12.0–16.0)
LYMPHS ABS: 2.8 10*3/uL (ref 1.0–3.6)
LYMPHS PCT: 32 %
MCH: 28.8 pg (ref 26.0–34.0)
MCHC: 34.5 g/dL (ref 32.0–36.0)
MCV: 83.5 fL (ref 80.0–100.0)
MONO ABS: 0.6 10*3/uL (ref 0.2–0.9)
MONOS PCT: 7 %
Neutro Abs: 4.9 10*3/uL (ref 1.4–6.5)
Neutrophils Relative %: 55 %
PLATELETS: 11 10*3/uL — AB (ref 150–400)
RBC: 4.72 MIL/uL (ref 3.80–5.20)
RDW: 13.5 % (ref 11.5–14.5)
WBC: 8.8 10*3/uL (ref 3.6–11.0)

## 2016-09-10 LAB — TECHNOLOGIST SMEAR REVIEW: TECH REVIEW: DECREASED

## 2016-09-10 LAB — COMPREHENSIVE METABOLIC PANEL
ALT: 28 U/L (ref 14–54)
ANION GAP: 8 (ref 5–15)
AST: 28 U/L (ref 15–41)
Albumin: 3.7 g/dL (ref 3.5–5.0)
Alkaline Phosphatase: 60 U/L (ref 38–126)
BUN: 13 mg/dL (ref 6–20)
CALCIUM: 8.7 mg/dL — AB (ref 8.9–10.3)
CHLORIDE: 99 mmol/L — AB (ref 101–111)
CO2: 26 mmol/L (ref 22–32)
CREATININE: 0.73 mg/dL (ref 0.44–1.00)
Glucose, Bld: 138 mg/dL — ABNORMAL HIGH (ref 65–99)
Potassium: 3.3 mmol/L — ABNORMAL LOW (ref 3.5–5.1)
SODIUM: 133 mmol/L — AB (ref 135–145)
Total Bilirubin: 0.6 mg/dL (ref 0.3–1.2)
Total Protein: 7.5 g/dL (ref 6.5–8.1)

## 2016-09-10 LAB — FOLATE: FOLATE: 22 ng/mL (ref >5.9)

## 2016-09-10 LAB — IRON AND TIBC
Iron: 50 ug/dL (ref 28–170)
Saturation Ratios: 17 % (ref 10.4–31.8)
TIBC: 299 ug/dL (ref 250–450)
UIBC: 249 ug/dL

## 2016-09-10 LAB — FERRITIN: Ferritin: 59 ng/mL (ref 11–307)

## 2016-09-10 LAB — VITAMIN B12: Vitamin B-12: 306 pg/mL (ref 180–914)

## 2016-09-10 LAB — LACTATE DEHYDROGENASE: LDH: 164 U/L (ref 98–192)

## 2016-09-10 MED ORDER — PREDNISONE 20 MG PO TABS
20.0000 mg | ORAL_TABLET | Freq: Every day | ORAL | 0 refills | Status: DC
Start: 2016-09-10 — End: 2016-09-18

## 2016-09-10 MED ORDER — PREDNISONE 50 MG PO TABS: ORAL_TABLET | ORAL | 0 refills | Status: DC

## 2016-09-10 MED ORDER — OMEPRAZOLE 20 MG PO CPDR: 20.0000 mg | DELAYED_RELEASE_CAPSULE | Freq: Every day | ORAL | 0 refills | Status: DC

## 2016-09-10 NOTE — Progress Notes (Signed)
Hematology/Oncology Consult note [facility] [phone] [phone]  CONSULT NOTE Patient Care Team: Malva Limes, MD as PCP - General (Family Medicine)  CHIEF COMPLAINTS/PURPOSE OF CONSULTATION:   I have low platelet count  HISTORY OF PRESENTING ILLNESS:  Julia Garner 66 y.o.  female with past medical history listed as below was referred by primary care provider Dr. Sherrie Mustache to me for evaluation of thrombocytopenia. Patient had regular check up with primary care provider and had labs done on 08/22/2016. Platelet counts were slow at 15,000. Patient has a history of chronic ITP. Her last ITP attack prior than this one was 7 or 8 months ago per patient. Patient was called back, and started on a course of prednisone tapering. She have another CBC done on 08/27/2016. Platelet count recovers to 120,000. Patient reports that she was seen by Dr. Sherrlyn Hock many many years ago but she stopped follow-up. She has a few ITP attacks per year. And when she noticed more bruising, she will call to primary care provider Dr. Sherrie Mustache and got prednisone treatment. Denies any prior transfusion, alcohol use.  Today she feels well. Denies shortness of breath, chest pain, abdominal pain, fatigue or weakness, night sweats. She reports having mouth sores quite often and wondering what kind of physician she should see.   ROS:  Review of Systems  Constitutional: Negative.   HENT:  Negative.   Eyes: Negative.   Respiratory: Negative.   Cardiovascular: Negative.   Gastrointestinal: Negative.   Endocrine: Negative.   Genitourinary: Negative.    Musculoskeletal: Negative.   Skin: Negative.   Neurological: Negative.   Hematological: Negative.   Psychiatric/Behavioral: Negative.     MEDICAL HISTORY:  Past Medical History:  Diagnosis Date  . Allergy   . Chronic ITP (idiopathic thrombocytopenia) (HCC)   . Head injuries   . History of seizure 2007   one seizure     . Hypertension     SURGICAL HISTORY: Past Surgical History:  Procedure Laterality Date  . CESAREAN SECTION  1990  . CHOLECYSTECTOMY  2005  . CYST EXCISION  1996   Tracheal Cyst    SOCIAL HISTORY: Social History   Social History  . Marital status: Married    Spouse name: N/A  . Number of children: 3  . Years of education: N/A   Occupational History  . retired     Adult nurse   Social History Main Topics  . Smoking status: Never Smoker  . Smokeless tobacco: Never Used  . Alcohol use No  . Drug use: No  . Sexual activity: Not on file   Other Topics Concern  . Not on file   Social History Narrative  . No narrative on file    FAMILY HISTORY: Family History  Problem Relation Age of Onset  . Heart attack Father        1st MI at age 66    ALLERGIES:  is allergic to penicillins; simcor  [niacin-simvastatin er]; and tylenol  [acetaminophen].  MEDICATIONS:  Current Outpatient Prescriptions  Medication Sig Dispense Refill  . azelastine (ASTELIN) 0.1 % nasal spray Place 2 sprays into both nostrils 2 (two) times daily. Use in each nostril as directed 30 mL 5  . Bioflavonoid Products (VITAMIN C PLUS) 500 MG TABS Take 1 tablet by mouth daily.    . Calcium-Magnesium-Vitamin D (CALCIUM 500 PO) Take 1 tablet by mouth daily.    . Glucosamine HCl 500 MG TABS Take 1 tablet by mouth daily.    Marland Kitchen  irbesartan-hydrochlorothiazide (AVALIDE) 150-12.5 MG tablet TAKE 1 TABLET EVERY DAY 90 tablet 4  . LORATADINE-D 24HR 10-240 MG 24 hr tablet TAKE 1 TABLET BY MOUTH DAILY AS NEEDED FOR ALLERGIES 30 tablet 12  . LORazepam (ATIVAN) 0.5 MG tablet Take 0.5-1 tablets (0.25-0.5 mg total) by mouth 2 (two) times daily as needed. 12 tablet 0  . montelukast (SINGULAIR) 10 MG tablet Take 1 tablet (10 mg total) by mouth at bedtime. 90 tablet 3  . naproxen (NAPROSYN) 500 MG tablet Take 1 tablet by mouth 2 (two) times daily.    . naproxen sodium (ANAPROX) 220 MG tablet Take by mouth as needed.     . Omega 3 1200 MG CAPS Take 2 capsules by mouth daily.    . sertraline (ZOLOFT) 50 MG tablet TAKE 1 AND 1/2 TABLETS BY MOUTH EVERY MORNING 135 tablet 2  . simvastatin (ZOCOR) 40 MG tablet TAKE ONE TABLET AT BEDTIME 90 tablet 4  . traZODone (DESYREL) 50 MG tablet TAKE ONE OR TWO TABLETS AT BEDTIME 180 tablet 4   No current facility-administered medications for this visit.       Marland Kitchen  PHYSICAL EXAMINATION: ECOG PERFORMANCE STATUS: 0 - Asymptomatic Vitals:   09/10/16 0850  BP: 107/71  Pulse: 83  Resp: 16  Temp: 97.8 F (36.6 C)   Filed Weights   09/10/16 0850  Weight: 166 lb 2 oz (75.4 kg)    GENERAL: Well-nourished well-developed; Alert, no distress and comfortable.  EYES: no pallor or icterus OROPHARYNX: no thrush or ulceration; good dentition  NECK: supple, no masses felt LYMPH:  no palpable lymphadenopathy in the cervical, axillary or inguinal regions LUNGS: clear to auscultation and  No wheeze or crackles HEART/CVS: regular rate & rhythm and no murmurs; No lower extremity edema ABDOMEN: abdomen soft, non-tender and normal bowel sounds Musculoskeletal:no cyanosis of digits and no clubbing  PSYCH: alert & oriented x 3  NEURO: no focal motor/sensory deficits SKIN:  no rashes or significant lesions. No bruises.   LABORATORY DATA:  I have reviewed the data as listed Lab Results  Component Value Date   WBC 14.5 (H) 08/27/2016   HGB 14.6 08/27/2016   HCT 43.1 08/27/2016   MCV 85 08/27/2016   PLT 120 (L) 08/27/2016    Recent Labs  12/21/15 1555  NA 144  K 3.9  CL 100  CO2 28  GLUCOSE 82  BUN 10  CREATININE 0.90  CALCIUM 9.4  GFRNONAA 68  GFRAA 79  PROT 8.2  ALBUMIN 4.1  AST 24  ALT 28  ALKPHOS 77  BILITOT 0.4    RADIOGRAPHIC STUDIES: I have personally reviewed the radiological images as listed and agreed with the findings in the report. No results found.  ASSESSMENT & PLAN:  1. Thrombocytopenia (HCC)   2. History of ITP   3. Leukocytosis,  unspecified type    With her clinical history, and improvement with steroids, this is likely another ITP flare. To complete work up of patient's thrombocytopenia, I recommend checking CBC;CMP, LDH; pathology smear review, folate, Vitamin B12, hepatitis, HIV and monoclonal gammopathy workup. Will also check ultrasound of the abdomen. I discussed with patient that, given her recurrent ITP history, options can be to start within the rituximab weekly for 4 doses to consolidate to reduce chance of ITP recurrence. For now let's complete basic workup and discuss cat next visit I offer the patient to come back in a week to discuss results. Patient reports that she will be out of town next  week for wedding. Therefore I will see her in about 2-4 weeks of range unless urgently needed. All questions were answered. The patient knows to call the clinic with any problems questions or concerns.  Addendum: labs came back her platelet is 11,000. Will repeat.  I sent Prednisone 70mg  daily starting today, also GI prophylaxis with omeprazole. Called patient and informed her about labs results and prescription. Repeat labs tomorrow morning.  Patient voices understanding and will pick up medication today.   Thank you for this kind referral and the opportunity to participate in the care of this patient. A copy of today's note is routed to referring provider Dr.Fisher  Lab w cbc tomorrow, see me on 09/18/2016.  Rickard Patience, MD, PhD Hematology Oncology [facility] at [facility] Pager- [phone] 09/10/2016

## 2016-09-10 NOTE — Progress Notes (Signed)
Patient here today as a new patient  

## 2016-09-11 ENCOUNTER — Inpatient Hospital Stay: Payer: BLUE CROSS/BLUE SHIELD

## 2016-09-11 DIAGNOSIS — D696 Thrombocytopenia, unspecified: Secondary | ICD-10-CM

## 2016-09-11 DIAGNOSIS — Z862 Personal history of diseases of the blood and blood-forming organs and certain disorders involving the immune mechanism: Secondary | ICD-10-CM

## 2016-09-11 DIAGNOSIS — D72829 Elevated white blood cell count, unspecified: Secondary | ICD-10-CM

## 2016-09-11 LAB — HEPATITIS PANEL, ACUTE
HEP A IGM: NEGATIVE
HEP B S AG: NEGATIVE
Hep B C IgM: NEGATIVE

## 2016-09-11 LAB — FANA STAINING PATTERNS

## 2016-09-11 LAB — CBC WITH DIFFERENTIAL/PLATELET
BASOS ABS: 0.1 10*3/uL (ref 0–0.1)
Basophils Relative: 0 %
EOS ABS: 0 10*3/uL (ref 0–0.7)
EOS PCT: 0 %
HCT: 39 % (ref 35.0–47.0)
Hemoglobin: 13.3 g/dL (ref 12.0–16.0)
Lymphocytes Relative: 22 %
Lymphs Abs: 3.7 10*3/uL — ABNORMAL HIGH (ref 1.0–3.6)
MCH: 28.6 pg (ref 26.0–34.0)
MCHC: 34.1 g/dL (ref 32.0–36.0)
MCV: 83.7 fL (ref 80.0–100.0)
MONO ABS: 1.6 10*3/uL — AB (ref 0.2–0.9)
Monocytes Relative: 10 %
Neutro Abs: 11.6 10*3/uL — ABNORMAL HIGH (ref 1.4–6.5)
Neutrophils Relative %: 68 %
PLATELETS: 25 10*3/uL — AB (ref 150–400)
RBC: 4.65 MIL/uL (ref 3.80–5.20)
RDW: 13.5 % (ref 11.5–14.5)
WBC: 17 10*3/uL — AB (ref 3.6–11.0)

## 2016-09-11 LAB — ANTINUCLEAR ANTIBODIES, IFA: ANTINUCLEAR ANTIBODIES, IFA: POSITIVE — AB

## 2016-09-11 LAB — HIV ANTIBODY (ROUTINE TESTING W REFLEX): HIV Screen 4th Generation wRfx: NONREACTIVE

## 2016-09-16 DIAGNOSIS — D693 Immune thrombocytopenic purpura: Secondary | ICD-10-CM | POA: Insufficient documentation

## 2016-09-18 ENCOUNTER — Inpatient Hospital Stay: Payer: BLUE CROSS/BLUE SHIELD | Admitting: Oncology

## 2016-09-18 ENCOUNTER — Inpatient Hospital Stay: Payer: BLUE CROSS/BLUE SHIELD | Attending: Oncology | Admitting: Oncology

## 2016-09-18 ENCOUNTER — Other Ambulatory Visit: Payer: Self-pay | Admitting: Oncology

## 2016-09-18 ENCOUNTER — Encounter: Payer: Self-pay | Admitting: Oncology

## 2016-09-18 ENCOUNTER — Ambulatory Visit
Admission: RE | Admit: 2016-09-18 | Discharge: 2016-09-18 | Disposition: A | Discharge status: Home / Self Care | Payer: BLUE CROSS/BLUE SHIELD | Source: Ambulatory Visit | Attending: Oncology | Admitting: Oncology

## 2016-09-18 VITALS — BP 126/71 | HR 82 | Temp 97.5°F | Resp 18 | Wt 165.0 lb

## 2016-09-18 DIAGNOSIS — D696 Thrombocytopenia, unspecified: Secondary | ICD-10-CM

## 2016-09-18 DIAGNOSIS — Z880 Allergy status to penicillin: Secondary | ICD-10-CM

## 2016-09-18 DIAGNOSIS — D72829 Elevated white blood cell count, unspecified: Secondary | ICD-10-CM | POA: Diagnosis not present

## 2016-09-18 DIAGNOSIS — Z7952 Long term (current) use of systemic steroids: Secondary | ICD-10-CM | POA: Insufficient documentation

## 2016-09-18 DIAGNOSIS — D693 Immune thrombocytopenic purpura: Secondary | ICD-10-CM | POA: Diagnosis not present

## 2016-09-18 DIAGNOSIS — Z79899 Other long term (current) drug therapy: Secondary | ICD-10-CM | POA: Diagnosis not present

## 2016-09-18 DIAGNOSIS — K760 Fatty (change of) liver, not elsewhere classified: Secondary | ICD-10-CM | POA: Insufficient documentation

## 2016-09-18 DIAGNOSIS — E876 Hypokalemia: Secondary | ICD-10-CM | POA: Diagnosis not present

## 2016-09-18 DIAGNOSIS — I10 Essential (primary) hypertension: Secondary | ICD-10-CM | POA: Diagnosis not present

## 2016-09-18 LAB — COMPREHENSIVE METABOLIC PANEL
ALBUMIN: 3.4 g/dL — AB (ref 3.5–5.0)
ALK PHOS: 52 U/L (ref 38–126)
ALT: 27 U/L (ref 14–54)
ANION GAP: 9 (ref 5–15)
AST: 27 U/L (ref 15–41)
BILIRUBIN TOTAL: 0.7 mg/dL (ref 0.3–1.2)
BUN: 16 mg/dL (ref 6–20)
CALCIUM: 8.5 mg/dL — AB (ref 8.9–10.3)
CO2: 30 mmol/L (ref 22–32)
Chloride: 98 mmol/L — ABNORMAL LOW (ref 101–111)
Creatinine, Ser: 0.75 mg/dL (ref 0.44–1.00)
GFR calc Af Amer: >60 mL/min (ref >60)
GLUCOSE: 105 mg/dL — AB (ref 65–99)
Potassium: 3.1 mmol/L — ABNORMAL LOW (ref 3.5–5.1)
Sodium: 137 mmol/L (ref 135–145)
TOTAL PROTEIN: 7 g/dL (ref 6.5–8.1)

## 2016-09-18 LAB — CBC WITH DIFFERENTIAL/PLATELET
BASOS PCT: 1 %
Basophils Absolute: 0.1 10*3/uL (ref 0–0.1)
Eosinophils Absolute: 0.5 10*3/uL (ref 0–0.7)
Eosinophils Relative: 4 %
HEMATOCRIT: 39.8 % (ref 35.0–47.0)
HEMOGLOBIN: 13.8 g/dL (ref 12.0–16.0)
LYMPHS PCT: 40 %
Lymphs Abs: 4.7 10*3/uL — ABNORMAL HIGH (ref 1.0–3.6)
MCH: 28.7 pg (ref 26.0–34.0)
MCHC: 34.6 g/dL (ref 32.0–36.0)
MCV: 82.8 fL (ref 80.0–100.0)
MONOS PCT: 7 %
Monocytes Absolute: 0.8 10*3/uL (ref 0.2–0.9)
NEUTROS ABS: 5.7 10*3/uL (ref 1.4–6.5)
NEUTROS PCT: 48 %
Platelets: 204 10*3/uL (ref 150–440)
RBC: 4.8 MIL/uL (ref 3.80–5.20)
RDW: 13.9 % (ref 11.5–14.5)
WBC: 11.8 10*3/uL — ABNORMAL HIGH (ref 3.6–11.0)

## 2016-09-18 MED ORDER — POTASSIUM CHLORIDE ER 20 MEQ PO TBCR: 1.0000 | EXTENDED_RELEASE_TABLET | Freq: Every day | ORAL | 0 refills | Status: DC

## 2016-09-18 MED ORDER — PREDNISONE 10 MG PO TABS: 10.0000 mg | ORAL_TABLET | Freq: Every day | ORAL | 0 refills | Status: DC

## 2016-09-18 NOTE — Progress Notes (Signed)
Here for follow up visit.

## 2016-09-18 NOTE — Progress Notes (Addendum)
Hematology/Oncology Follow up visit [facility] [phone] Fax:(336) [phone]  CONSULT NOTE Patient Care Team: Malva Limes, MD as PCP - General (Family Medicine)  CHIEF COMPLAINTS/PURPOSE OF CONSULTATION:   I have low platelet count  HISTORY OF PRESENTING ILLNESS:  Julia Garner 66 y.o.  female with past medical history listed as below was referred by primary care provider Dr. Sherrie Mustache to me for evaluation of thrombocytopenia. Patient had regular check up with primary care provider and had labs done on 08/22/2016. Platelet counts were slow at 15,000. Patient has a history of chronic ITP. Her last ITP attack prior than this one was 7 or 8 months ago per patient. Patient was called back, and started on a course of prednisone tapering. She have another CBC done on 08/27/2016. Platelet count recovers to 120,000. Patient reports that she was seen by Dr. Sherrlyn Hock many many years ago but she stopped follow-up. She has a few ITP attacks per year. And when she noticed more bruising, she will call to primary care provider Dr. Sherrie Mustache and got prednisone treatment. Denies any prior transfusion, alcohol use.  Today she feels well. Denies shortness of breath, chest pain, abdominal pain, fatigue or weakness, night sweats. She reports having mouth sores quite often and wondering what kind of physician she should see.   INTERVAL HISTORY Patient presents to discuss about the results. No new complaints. She is taking 70mg  prednisone daily as instructed.   ROS:  Review of Systems  Constitutional: Negative.   HENT:  Negative.   Eyes: Negative.   Respiratory: Negative.   Cardiovascular: Negative.   Gastrointestinal: Negative.   Endocrine: Negative.   Genitourinary: Negative.    Musculoskeletal: Negative.   Skin: Negative.   Neurological: Negative.   Hematological: Negative.   Psychiatric/Behavioral: Negative.     MEDICAL HISTORY:  Past Medical History:    Diagnosis Date  . Allergy   . Chronic ITP (idiopathic thrombocytopenia) (HCC)   . Head injuries   . History of seizure 2007   one seizure   . Hypertension     SURGICAL HISTORY: Past Surgical History:  Procedure Laterality Date  . CESAREAN SECTION  1990  . CHOLECYSTECTOMY  2005  . CYST EXCISION  1996   Tracheal Cyst    SOCIAL HISTORY: Social History   Social History  . Marital status: Married    Spouse name: N/A  . Number of children: 3  . Years of education: N/A   Occupational History  . retired     Adult nurse   Social History Main Topics  . Smoking status: Never Smoker  . Smokeless tobacco: Never Used  . Alcohol use No  . Drug use: No  . Sexual activity: Not on file   Other Topics Concern  . Not on file   Social History Narrative  . No narrative on file    FAMILY HISTORY: Family History  Problem Relation Age of Onset  . Heart attack Father        1st MI at age 41    ALLERGIES:  is allergic to penicillins; simcor  [niacin-simvastatin er]; and tylenol  [acetaminophen].  MEDICATIONS:  Current Outpatient Prescriptions  Medication Sig Dispense Refill  . irbesartan-hydrochlorothiazide (AVALIDE) 150-12.5 MG tablet TAKE 1 TABLET EVERY DAY 90 tablet 4  . LORATADINE-D 24HR 10-240 MG 24 hr tablet TAKE 1 TABLET BY MOUTH DAILY AS NEEDED FOR ALLERGIES 30 tablet 12  . naproxen (NAPROSYN) 500 MG tablet Take 1 tablet by mouth 2 (two) times  daily.    . omeprazole (PRILOSEC) 20 MG capsule Take 1 capsule (20 mg total) by mouth daily. 30 capsule 0  . sertraline (ZOLOFT) 50 MG tablet TAKE 1 AND 1/2 TABLETS BY MOUTH EVERY MORNING 135 tablet 2  . simvastatin (ZOCOR) 40 MG tablet TAKE ONE TABLET AT BEDTIME 90 tablet 4  . traZODone (DESYREL) 50 MG tablet TAKE ONE OR TWO TABLETS AT BEDTIME 180 tablet 4  . azelastine (ASTELIN) 0.1 % nasal spray Place 2 sprays into both nostrils 2 (two) times daily. Use in each nostril as directed (Patient not taking: Reported on  09/10/2016) 30 mL 5  . Bioflavonoid Products (VITAMIN C PLUS) 500 MG TABS Take 1 tablet by mouth daily.    . Calcium-Magnesium-Vitamin D (CALCIUM 500 PO) Take 1 tablet by mouth daily.    . Glucosamine HCl 500 MG TABS Take 1 tablet by mouth daily.    Marland Kitchen. LORazepam (ATIVAN) 0.5 MG tablet Take 0.5-1 tablets (0.25-0.5 mg total) by mouth 2 (two) times daily as needed. (Patient not taking: Reported on 09/18/2016) 12 tablet 0  . montelukast (SINGULAIR) 10 MG tablet Take 1 tablet (10 mg total) by mouth at bedtime. (Patient not taking: Reported on 09/18/2016) 90 tablet 3  . Omega 3 1200 MG CAPS Take 2 capsules by mouth daily.    . predniSONE (DELTASONE) 10 MG tablet Take 1 tablet (10 mg total) by mouth daily with breakfast. Following the instruction.  Take 60mg  daily from today x 7 days.  Then 50mg  daily x 7 days.  Then 40 mg daily x7 days.  Then 30mg  daily x 7 days.  Then 20mg  daily x 7 days Then 10mg  daily x 7 days. Continue on 10mg  daily until instructed by me. 200 tablet 0   No current facility-administered medications for this visit.       Marland Kitchen.  PHYSICAL EXAMINATION: ECOG PERFORMANCE STATUS: 0 - Asymptomatic Vitals:   09/18/16 0921  BP: 126/71  Pulse: 82  Resp: 18  Temp: (!) 97.5 F (36.4 C)   Filed Weights   09/18/16 0921  Weight: 165 lb (74.8 kg)    GENERAL: Well-nourished well-developed; Alert, no distress and comfortable.  EYES: no pallor or icterus OROPHARYNX: no thrush or ulceration; good dentition  NECK: supple, no masses felt LYMPH:  no palpable lymphadenopathy in the cervical, axillary or inguinal regions LUNGS: clear to auscultation and  No wheeze or crackles HEART/CVS: regular rate & rhythm and no murmurs; No lower extremity edema ABDOMEN: abdomen soft, non-tender and normal bowel sounds Musculoskeletal:no cyanosis of digits and no clubbing  PSYCH: alert & oriented x 3  NEURO: no focal motor/sensory deficits SKIN:  no rashes or significant lesions. No bruises.    LABORATORY DATA:  I have reviewed the data as listed Lab Results  Component Value Date   WBC 11.8 (H) 09/18/2016   HGB 13.8 09/18/2016   HCT 39.8 09/18/2016   MCV 82.8 09/18/2016   PLT 204 09/18/2016    Recent Labs  12/21/15 1555 09/10/16 0908 09/18/16 0902  NA 144 133* 137  K 3.9 3.3* 3.1*  CL 100 99* 98*  CO2 28 26 30   GLUCOSE 82 138* 105*  BUN 10 13 16   CREATININE 0.90 0.73 0.75  CALCIUM 9.4 8.7* 8.5*  GFRNONAA 68 >60 >60  GFRAA 79 >60 >60  PROT 8.2 7.5 7.0  ALBUMIN 4.1 3.7 3.4*  AST 24 28 27   ALT 28 28 27   ALKPHOS 77 60 52  BILITOT 0.4 0.6 0.7  RADIOGRAPHIC STUDIES: I have personally reviewed the radiological images as listed and agreed with the findings in the report. US Abdomen Complete  Result Date: 09/18/2016 CLINICAL DATA:  Thrombocytopenia EXAM: ABDOMEN ULTRASOUND COMPLETE COMPARISON:  None. FINDINGS: Gallbladder: Status post cholecystectomy. Common bile duct: Diameter: 7.4 mm Liver: Increased parenchymal echogenicity compatible with hepatic steatosis. No focal lesion. Portal vein is patent on color Doppler imaging with normal direction of blood flow towards the liver. IVC: No abnormality visualized. Pancreas: Visualized portion unremarkable. Spleen: Size and appearance within normal limits. Right Kidney: Length: 11.0 cm. Echogenicity within normal limits. No mass or hydronephrosis visualized. Left Kidney: Length: 10.5 cm. Echogenicity within normal limits. No mass or hydronephrosis visualized. Abdominal aorta: No aneurysm visualized. Other findings: None. IMPRESSION: 1. No acute findings. 2. Hepatic steatosis. Electronically Signed   By: Signa Kell M.D.   On: 09/18/2016 09:06    ASSESSMENT & PLAN:  1. Acute ITP (HCC)   2. Thrombocytopenia (HCC)   3. Leukocytosis, unspecified type   4. Hypokalemia     # Work up shows positive ANA. Refer to Rheumatology. Patient tells me that she has a friend who is Rheumatologist who she will be seeking advice form.   #  ultrasound of the abdomen shoed no spleenmegaly. Fatty liver disease.   # I discussed with patient that the steroids need to be slowly tapered down. Plan 60mg  prednisone daily starting today and decrease 10mg  every week.  I ordered 10mg  tablets of prednisone Rx sent to her pharmacy.  Once she is tapered down to 10mg  daily, will further go down to 5mg  and then to 2.5mg  and finally tapered off. If her ITP is prednisone refractory, will explore other options at that time.  GI prophylaxis with omeprazole # leukocytosis is due to steroid.   # hypokalemia, send potassium supplement daily for 5 days. Add magnesium level.  All questions were answered. The patient knows to call the clinic with any problems questions or concerns. .Thank you for this kind referral and the opportunity to participate in the care of this patient. A copy of today's note is routed to referring provider Dr.Fisher  Lab w cbc tomorrow, see me in 4 weeks.    Rickard Patience, MD, PhD Hematology Oncology [facility] at [facility] Pager- [phone] 09/18/2016

## 2016-09-19 ENCOUNTER — Other Ambulatory Visit: Payer: Self-pay

## 2016-09-19 DIAGNOSIS — D72829 Elevated white blood cell count, unspecified: Secondary | ICD-10-CM

## 2016-09-19 DIAGNOSIS — D693 Immune thrombocytopenic purpura: Secondary | ICD-10-CM

## 2016-09-19 DIAGNOSIS — D696 Thrombocytopenia, unspecified: Secondary | ICD-10-CM

## 2016-09-19 LAB — MAGNESIUM: Magnesium: 2 mg/dL (ref 1.7–2.4)

## 2016-10-08 ENCOUNTER — Ambulatory Visit: Payer: BLUE CROSS/BLUE SHIELD | Admitting: Oncology

## 2016-10-08 ENCOUNTER — Other Ambulatory Visit: Payer: BLUE CROSS/BLUE SHIELD

## 2016-10-10 LAB — HM DEXA SCAN: HM Dexa Scan: NORMAL

## 2016-10-16 ENCOUNTER — Encounter: Payer: Self-pay | Admitting: Oncology

## 2016-10-16 ENCOUNTER — Inpatient Hospital Stay: Payer: BLUE CROSS/BLUE SHIELD | Attending: Oncology | Admitting: Oncology

## 2016-10-16 ENCOUNTER — Inpatient Hospital Stay: Payer: BLUE CROSS/BLUE SHIELD

## 2016-10-16 VITALS — BP 98/65 | HR 80 | Temp 98.1°F | Wt 167.4 lb

## 2016-10-16 DIAGNOSIS — E876 Hypokalemia: Secondary | ICD-10-CM

## 2016-10-16 DIAGNOSIS — D696 Thrombocytopenia, unspecified: Secondary | ICD-10-CM

## 2016-10-16 DIAGNOSIS — I10 Essential (primary) hypertension: Secondary | ICD-10-CM | POA: Diagnosis not present

## 2016-10-16 DIAGNOSIS — Z79899 Other long term (current) drug therapy: Secondary | ICD-10-CM | POA: Insufficient documentation

## 2016-10-16 DIAGNOSIS — D72829 Elevated white blood cell count, unspecified: Secondary | ICD-10-CM

## 2016-10-16 DIAGNOSIS — D72828 Other elevated white blood cell count: Secondary | ICD-10-CM | POA: Diagnosis not present

## 2016-10-16 DIAGNOSIS — Z7952 Long term (current) use of systemic steroids: Secondary | ICD-10-CM

## 2016-10-16 DIAGNOSIS — D693 Immune thrombocytopenic purpura: Secondary | ICD-10-CM | POA: Insufficient documentation

## 2016-10-16 DIAGNOSIS — R768 Other specified abnormal immunological findings in serum: Secondary | ICD-10-CM

## 2016-10-16 DIAGNOSIS — R760 Raised antibody titer: Secondary | ICD-10-CM | POA: Diagnosis not present

## 2016-10-16 DIAGNOSIS — Z880 Allergy status to penicillin: Secondary | ICD-10-CM | POA: Insufficient documentation

## 2016-10-16 LAB — CBC WITH DIFFERENTIAL/PLATELET
BASOS ABS: 0 10*3/uL (ref 0–0.1)
BASOS PCT: 0 %
EOS ABS: 0.2 10*3/uL (ref 0–0.7)
EOS PCT: 2 %
HEMATOCRIT: 39.9 % (ref 35.0–47.0)
Hemoglobin: 13.7 g/dL (ref 12.0–16.0)
Lymphocytes Relative: 44 %
Lymphs Abs: 5.2 10*3/uL — ABNORMAL HIGH (ref 1.0–3.6)
MCH: 28.7 pg (ref 26.0–34.0)
MCHC: 34.3 g/dL (ref 32.0–36.0)
MCV: 83.6 fL (ref 80.0–100.0)
MONO ABS: 1.2 10*3/uL — AB (ref 0.2–0.9)
Monocytes Relative: 10 %
NEUTROS ABS: 5.2 10*3/uL (ref 1.4–6.5)
Neutrophils Relative %: 44 %
PLATELETS: 183 10*3/uL (ref 150–440)
RBC: 4.77 MIL/uL (ref 3.80–5.20)
RDW: 14.5 % (ref 11.5–14.5)
WBC: 11.8 10*3/uL — ABNORMAL HIGH (ref 3.6–11.0)

## 2016-10-16 LAB — BASIC METABOLIC PANEL
ANION GAP: 10 (ref 5–15)
BUN: 17 mg/dL (ref 6–20)
CO2: 26 mmol/L (ref 22–32)
Calcium: 9 mg/dL (ref 8.9–10.3)
Chloride: 97 mmol/L — ABNORMAL LOW (ref 101–111)
Creatinine, Ser: 0.72 mg/dL (ref 0.44–1.00)
Glucose, Bld: 127 mg/dL — ABNORMAL HIGH (ref 65–99)
POTASSIUM: 3.3 mmol/L — AB (ref 3.5–5.1)
SODIUM: 133 mmol/L — AB (ref 135–145)

## 2016-10-16 MED ORDER — POTASSIUM CHLORIDE ER 20 MEQ PO TBCR: 1.0000 | EXTENDED_RELEASE_TABLET | Freq: Every day | ORAL | 0 refills | Status: DC

## 2016-10-16 MED ORDER — PREDNISONE 1 MG PO TABS: 1.0000 mg | ORAL_TABLET | ORAL | 0 refills | Status: DC

## 2016-10-16 NOTE — Progress Notes (Signed)
Hematology/Oncology Follow up visit [facility] [phone] [phone] Patient Care Team: Malva Limes, MD as PCP - General (Family Medicine)  REASON FOR VISIT Follow up for treatment of ITP  PERTINENT HEMATOLOGY HISTORY Julia Garner 66 y.o.  female with past medical history listed as below was referred by primary care provider Dr. Sherrie Mustache to me for evaluation of thrombocytopenia. Patient had regular check up with primary care provider and had labs done on 08/22/2016. Platelet counts were slow at 15,000. Patient has a history of chronic ITP. Her last ITP attack prior than this one was 7 or 8 months ago per patient. Patient was called back, and started on a course of prednisone tapering. She have another CBC done on 08/27/2016. Platelet count recovers to 120,000. Patient reports that she was seen by Dr. Sherrlyn Hock many many years ago but she stopped follow-up. She has a few ITP attacks per year. And when she noticed more bruising, she will call to primary care provider Dr. Sherrie Mustache and got prednisone treatment. Denies any prior transfusion, alcohol use. She has another episode of ITP which was treated by PCP with a short course of prednisone. Platelet improved but decreased again. She was restarted on prednisone and platelet counts improved.  INTERVAL HISTORY Julia Garner is a 66 y.o. female who has above hematology history reviewed and edited by me today presents for follow up visit for management of ITP.  Problems and complaints are listed below: ITP: she is on tapering course of her steroids, going to start on  daily of prednisone today.  Today she feels well. Denies shortness of breath, chest pain, abdominal pain, fatigue or weakness, night sweats. She reports having mouth sores quite often and wondering what kind of physician she should see.    ROS:  Review of Systems  Constitutional: Negative.   HENT:  Negative.   Eyes: Negative.     Respiratory: Negative.   Cardiovascular: Negative.   Gastrointestinal: Negative.   Endocrine: Negative.   Genitourinary: Negative.    Musculoskeletal: Negative.   Skin: Negative.   Neurological: Negative.   Hematological: Negative.   Psychiatric/Behavioral: Negative.     MEDICAL HISTORY:  Past Medical History:  Diagnosis Date  . Allergy   . Chronic ITP (idiopathic thrombocytopenia) (HCC)   . Head injuries   . History of seizure 2007   one seizure   . Hypertension     SURGICAL HISTORY: Past Surgical History:  Procedure Laterality Date  . CESAREAN SECTION  1990  . CHOLECYSTECTOMY  2005  . CYST EXCISION  1996   Tracheal Cyst    SOCIAL HISTORY: Social History   Social History  . Marital status: Married    Spouse name: N/A  . Number of children: 3  . Years of education: N/A   Occupational History  . retired     Adult nurse   Social History Main Topics  . Smoking status: Never Smoker  . Smokeless tobacco: Never Used  . Alcohol use No  . Drug use: No  . Sexual activity: Not on file   Other Topics Concern  . Not on file   Social History Narrative  . No narrative on file    FAMILY HISTORY: Family History  Problem Relation Age of Onset  . Heart attack Father        1st MI at age 59    ALLERGIES:  is allergic to penicillins; simcor  [niacin-simvastatin er]; and tylenol  [acetaminophen].  MEDICATIONS:  Current Outpatient  Prescriptions  Medication Sig Dispense Refill  . azelastine (ASTELIN) 0.1 % nasal spray Place 2 sprays into both nostrils 2 (two) times daily. Use in each nostril as directed (Patient not taking: Reported on 09/10/2016) 30 mL 5  . Bioflavonoid Products (VITAMIN C PLUS) 500 MG TABS Take 1 tablet by mouth daily.    . Calcium-Magnesium-Vitamin D (CALCIUM 500 PO) Take 1 tablet by mouth daily.    . Glucosamine HCl 500 MG TABS Take 1 tablet by mouth daily.    . irbesartan-hydrochlorothiazide (AVALIDE) 150-12.5 MG tablet TAKE 1 TABLET  EVERY DAY 90 tablet 4  . LORATADINE-D 24HR 10-240 MG 24 hr tablet TAKE 1 TABLET BY MOUTH DAILY AS NEEDED FOR ALLERGIES 30 tablet 12  . LORazepam (ATIVAN) 0.5 MG tablet Take 0.5-1 tablets (0.25-0.5 mg total) by mouth 2 (two) times daily as needed. (Patient not taking: Reported on 09/18/2016) 12 tablet 0  . montelukast (SINGULAIR) 10 MG tablet Take 1 tablet (10 mg total) by mouth at bedtime. (Patient not taking: Reported on 09/18/2016) 90 tablet 3  . naproxen (NAPROSYN) 500 MG tablet Take 1 tablet by mouth 2 (two) times daily.    . Omega 3 1200 MG CAPS Take 2 capsules by mouth daily.    Marland Kitchen omeprazole (PRILOSEC) 20 MG capsule Take 1 capsule (20 mg total) by mouth daily. 30 capsule 0  . Potassium Chloride ER 20 MEQ TBCR Take 1 tablet by mouth daily. 10 tablet 0  . predniSONE (DELTASONE) 1 MG tablet Take 1 tablet (1 mg total) by mouth See admin instructions. Take  daily for 7 days, Then  daily for 7 days Then  daily for 7 days.  Take  every other day for 7 days.  Then stop. 73 tablet 0  . predniSONE (DELTASONE) 10 MG tablet Take 1 tablet (10 mg total) by mouth daily with breakfast. Following the instruction.  Take  daily from today x 7 days.  Then  daily x 7 days.  Then 40 mg daily x7 days.  Then  daily x 7 days.  Then  daily x 7 days Then  daily x 7 days. Continue on  daily until instructed by me. 200 tablet 0  . sertraline (ZOLOFT) 50 MG tablet TAKE 1 AND 1/2 TABLETS BY MOUTH EVERY MORNING 135 tablet 2  . simvastatin (ZOCOR) 40 MG tablet TAKE ONE TABLET AT BEDTIME 90 tablet 4  . traZODone (DESYREL) 50 MG tablet TAKE ONE OR TWO TABLETS AT BEDTIME 180 tablet 4   No current facility-administered medications for this visit.       Marland Kitchen  PHYSICAL EXAMINATION: ECOG PERFORMANCE STATUS: 0 - Asymptomatic Vitals:   10/16/16 1113  BP: 98/65  Pulse: 80  Temp: 98.1 F (36.7 C)   Filed Weights   10/16/16 1113  Weight: 167 lb 6 oz (75.9 kg)    GENERAL:  Well-nourished well-developed; Alert, no distress and comfortable.  EYES: no pallor or icterus OROPHARYNX: no thrush or ulceration; good dentition  NECK: supple, no masses felt LYMPH:  no palpable lymphadenopathy in the cervical, axillary or inguinal regions LUNGS: clear to auscultation and  No wheeze or crackles HEART/CVS: regular rate & rhythm and no murmurs; No lower extremity edema ABDOMEN: abdomen soft, non-tender and normal bowel sounds Musculoskeletal:no cyanosis of digits and no clubbing  PSYCH: alert & oriented x 3  NEURO: no focal motor/sensory deficits SKIN:  no rashes or significant lesions. No bruises.   LABORATORY DATA:  I have reviewed the data as listed Lab  Results  Component Value Date   WBC 11.8 (H) 10/16/2016   HGB 13.7 10/16/2016   HCT 39.9 10/16/2016   MCV 83.6 10/16/2016   PLT 183 10/16/2016    Recent Labs  12/21/15 1555 09/10/16 0908 09/18/16 0902 10/16/16 1057  NA 144 133* 137 133*  K 3.9 3.3* 3.1* 3.3*  CL 100 99* 98* 97*  CO2 GLUCOSE 82 138* 105* 127*  BUN CREATININE 0.90 0.73 0.75 0.72  CALCIUM 9.4 8.7* 8.5* 9.0  GFRNONAA 68 >60 >60 >60  GFRAA 79 >60 >60 >60  PROT 8.2 7.5 7.0  --   ALBUMIN 4.1 3.7 3.4*  --   AST --   ALT --   ALKPHOS 77 60 52  --   BILITOT 0.4 0.6 0.7  --    positive ANA  RADIOGRAPHIC STUDIES: I have personally reviewed the radiological images as listed and agreed with the findings in the report. US Abdomen Complete  Result Date: 09/18/2016 CLINICAL DATA:  Thrombocytopenia EXAM: ABDOMEN ULTRASOUND COMPLETE COMPARISON:  None. FINDINGS: Gallbladder: Status post cholecystectomy. Common bile duct: Diameter: 7.4 mm Liver: Increased parenchymal echogenicity compatible with hepatic steatosis. No focal lesion. Portal vein is patent on color Doppler imaging with normal direction of blood flow towards the liver. IVC: No abnormality visualized. Pancreas: Visualized portion unremarkable.  Spleen: Size and appearance within normal limits. Right Kidney: Length: 11.0 cm. Echogenicity within normal limits. No mass or hydronephrosis visualized. Left Kidney: Length: 10.5 cm. Echogenicity within normal limits. No mass or hydronephrosis visualized. Abdominal aorta: No aneurysm visualized. Other findings: None. IMPRESSION: 1. No acute findings. 2. Hepatic steatosis. Electronically Signed   By: Signa Kell M.D.   On: 09/18/2016 09:06  #  ultrasound of the abdomen shoed no spleenmegaly. Fatty liver disease.   ASSESSMENT & PLAN:  1. Acute ITP (HCC)   2. Hypokalemia   3. Positive ANA (antinuclear antibody)    1 continue finish her tapering course. Continue PPI.  Detailed instruction was discussed.  She will start  daily x 7 days, followed by  daily x 7 days, followed by  daily for 7 days, followed by  daily for 7 days, followed by  daily x 7 days and then  every other day for 7 days and then stop.  Today cbc was reviewed and platelet counts are stable.  Patient reports that she plans to travel to [place] at the end of this months and plan to return during the beginning of December.  She will likely finish her tapering course during her trip. Advise her to recheck CBC in 2 weeks, if stable, she can go for her trip while continuing her tapering course. When she finishes her course, she needs to recheck her platelet counts in [place]. If platelet counts drops below 75,000, she should resume taking  prednisone until she sees me in the clinic and we will need to discuss about Rituximab.   2 Hypokalemia: better than last CMP. Prescribed another course of potassium oral supplements x 10 days.  3 Work up shows positive ANA. Refer to Rheumatology. Patient tells me that she has a friend who is Rheumatologist who she will be seeking advice form.    # I discussed with patient that the steroids need to be slowly tapered down. Plan  prednisone daily starting today and  decrease  every week.  I ordered  tablets of prednisone Rx sent to  her pharmacy.  Once she is tapered down to  daily, will further go down to  and then to 2.5mg  and finally tapered off. If her ITP is prednisone refractory, will explore other options at that time.  GI prophylaxis with omeprazole # leukocytosis is due to steroid.     All questions were answered. The patient knows to call the clinic with any problems questions or concerns. Lab w cbc tomorrow, see me in December after she comes back.   Rickard Patience, MD, PhD Hematology Oncology [facility] Pager- [phone] 10/16/2016

## 2016-10-16 NOTE — Progress Notes (Signed)
Patient here today for follow up.  Patient states no new concerns today  

## 2016-10-25 ENCOUNTER — Encounter: Payer: Self-pay | Admitting: Family Medicine

## 2016-10-25 ENCOUNTER — Ambulatory Visit
Admission: RE | Admit: 2016-10-25 | Discharge: 2016-10-25 | Disposition: A | Discharge status: Home / Self Care | Payer: BLUE CROSS/BLUE SHIELD | Source: Ambulatory Visit | Attending: Family Medicine | Admitting: Family Medicine

## 2016-10-25 ENCOUNTER — Ambulatory Visit (INDEPENDENT_AMBULATORY_CARE_PROVIDER_SITE_OTHER): Payer: BLUE CROSS/BLUE SHIELD | Admitting: Family Medicine

## 2016-10-25 ENCOUNTER — Telehealth: Payer: Self-pay

## 2016-10-25 VITALS — BP 124/72 | HR 99 | Temp 98.7°F | Resp 20 | Wt 168.0 lb

## 2016-10-25 DIAGNOSIS — R05 Cough: Secondary | ICD-10-CM | POA: Diagnosis not present

## 2016-10-25 DIAGNOSIS — R059 Cough, unspecified: Secondary | ICD-10-CM

## 2016-10-25 DIAGNOSIS — R062 Wheezing: Secondary | ICD-10-CM | POA: Diagnosis not present

## 2016-10-25 MED ORDER — ALBUTEROL SULFATE HFA 108 (90 BASE) MCG/ACT IN AERS: 2.0000 | INHALATION_SPRAY | Freq: Four times a day (QID) | RESPIRATORY_TRACT | 0 refills | Status: DC | PRN

## 2016-10-25 NOTE — Telephone Encounter (Signed)
-----   Message from Erasmo Downer, MD sent at 10/25/2016  2:30 PM EDT ----- No pneumonia. Likely viral bronchitis. Continue prednisone as prescribed by hematology. Use the albuterol inhaler as needed. Continue to use Mucinex or other cough medicines as needed.  Erasmo Downer, MD, MPH [facility] 10/25/2016 2:30 PM

## 2016-10-25 NOTE — Progress Notes (Signed)
Patient: Julia Garner Female    DOB: 1950-05-18   66 y.o.   MRN: [id number] Visit Date: 10/25/2016  Today's Provider: Shirlee Latch, MD   Chief Complaint  Patient presents with  . Cough   Subjective:    Cough  This is a new problem. Episode onset: x 1 week. The problem has been gradually worsening. The cough is productive of sputum. Associated symptoms include headaches, shortness of breath and wheezing (started yesterday). Pertinent negatives include no chest pain, chills, ear congestion, ear pain, fever, heartburn, hemoptysis, myalgias, nasal congestion, postnasal drip, rhinorrhea, sore throat, sweats or weight loss. Nothing aggravates the symptoms. Treatments tried: Mucinex, Claritin D, Delsym. The treatment provided no relief. Her past medical history is significant for environmental allergies. There is no history of asthma, COPD or pneumonia.   On chronic prednisone for >2 months.  Has worked down to  daily.  Followed by Hematology for ITP.  No known sick contacts.  States that she has prompted to come in today because her symptoms have not been getting better over the last week and she wants to be well before she leaves the country on 11/14/16. Currently taking clindamycin for a dental infection    Allergies  Allergen Reactions  . Penicillins Itching    Hot flashes.  . Simcor  [Niacin-Simvastatin Er]     Flushing  . Tylenol  [Acetaminophen]      Current Outpatient Prescriptions:  .  Bioflavonoid Products (VITAMIN C PLUS) 500 MG TABS, Take 1 tablet by mouth daily., Disp: , Rfl:  .  Calcium-Magnesium-Vitamin D (CALCIUM 500 PO), Take 1 tablet by mouth daily., Disp: , Rfl:  .  clindamycin (CLEOCIN) 150 MG capsule, Take 1 capsule by mouth daily., Disp: , Rfl: 0 .  Glucosamine HCl 500 MG TABS, Take 1 tablet by mouth daily., Disp: , Rfl:  .  irbesartan-hydrochlorothiazide (AVALIDE) 150-12.5 MG tablet, TAKE 1 TABLET EVERY DAY, Disp: 90 tablet, Rfl: 4 .   LORATADINE-D 24HR 10-240 MG 24 hr tablet, TAKE 1 TABLET BY MOUTH DAILY AS NEEDED FOR ALLERGIES, Disp: 30 tablet, Rfl: 12 .  naproxen (NAPROSYN) 500 MG tablet, Take 1 tablet by mouth 2 (two) times daily., Disp: , Rfl:  .  Omega 3 1200 MG CAPS, Take 2 capsules by mouth daily., Disp: , Rfl:  .  omeprazole (PRILOSEC) 20 MG capsule, Take 1 capsule (20 mg total) by mouth daily., Disp: 30 capsule, Rfl: 0 .  Potassium Chloride ER 20 MEQ TBCR, Take 1 tablet by mouth daily., Disp: 10 tablet, Rfl: 0 .  predniSONE (DELTASONE) 10 MG tablet, Take 1 tablet by mouth 2 (two) times daily., Disp: , Rfl: 0 .  sertraline (ZOLOFT) 50 MG tablet, TAKE 1 AND 1/2 TABLETS BY MOUTH EVERY MORNING, Disp: 135 tablet, Rfl: 2 .  simvastatin (ZOCOR) 40 MG tablet, TAKE ONE TABLET AT BEDTIME, Disp: 90 tablet, Rfl: 4 .  traZODone (DESYREL) 50 MG tablet, TAKE ONE OR TWO TABLETS AT BEDTIME, Disp: 180 tablet, Rfl: 4 .  azelastine (ASTELIN) 0.1 % nasal spray, Place 2 sprays into both nostrils 2 (two) times daily. Use in each nostril as directed (Patient not taking: Reported on 09/10/2016), Disp: 30 mL, Rfl: 5 .  LORazepam (ATIVAN) 0.5 MG tablet, Take 0.5-1 tablets (0.25-0.5 mg total) by mouth 2 (two) times daily as needed. (Patient not taking: Reported on 09/18/2016), Disp: 12 tablet, Rfl: 0 .  montelukast (SINGULAIR) 10 MG tablet, Take 1 tablet (10 mg total) by mouth  at bedtime. (Patient not taking: Reported on 09/18/2016), Disp: 90 tablet, Rfl: 3  Review of Systems  Constitutional: Negative for activity change, appetite change, chills, fever and weight loss.  HENT: Negative for congestion, ear pain, postnasal drip, rhinorrhea and sore throat.   Respiratory: Positive for cough, shortness of breath and wheezing (started yesterday). Negative for apnea, hemoptysis and chest tightness.   Cardiovascular: Negative for chest pain, palpitations and leg swelling.  Gastrointestinal: Negative for heartburn.  Musculoskeletal: Negative for myalgias.    Skin: Negative.   Allergic/Immunologic: Positive for environmental allergies.  Neurological: Positive for headaches. Negative for syncope and light-headedness.  Psychiatric/Behavioral: Negative.     Social History  Substance Use Topics  . Smoking status: Never Smoker  . Smokeless tobacco: Never Used  . Alcohol use No   Objective:   BP 124/72 (BP Location: Left Arm, Patient Position: Sitting, Cuff Size: Large)   Pulse 99   Temp 98.7 F (37.1 C) (Oral)   Resp 20   Wt 168 lb (76.2 kg)   SpO2 95%   BMI 31.48 kg/m  Vitals:   10/25/16 1101  BP: 124/72  Pulse: 99  Resp: 20  Temp: 98.7 F (37.1 C)  TempSrc: Oral  SpO2: 95%  Weight: 168 lb (76.2 kg)     Physical Exam  Constitutional: She is oriented to person, place, and time. She appears well-developed and well-nourished. No distress.  HENT:  Head: Normocephalic and atraumatic.  Right Ear: External ear normal.  Left Ear: External ear normal.  Nose: Nose normal.  Mouth/Throat: Oropharynx is clear and moist.  Eyes: Pupils are equal, round, and reactive to light. Conjunctivae are normal. No scleral icterus.  Neck: Neck supple.  Cardiovascular: Normal rate, regular rhythm, normal heart sounds and intact distal pulses.   No murmur heard. Pulmonary/Chest: Effort normal. No accessory muscle usage. No tachypnea. No respiratory distress. She has decreased breath sounds in the right lower field. She has wheezes in the left lower field. She has no rhonchi. She has no rales.  Abdominal: Soft. Bowel sounds are normal. She exhibits no distension. There is no tenderness. There is no rebound and no guarding.  Musculoskeletal: She exhibits no edema or deformity.  Lymphadenopathy:    She has no cervical adenopathy.  Neurological: She is alert and oriented to person, place, and time.  Skin: Skin is warm and dry. No rash noted.  Psychiatric: She has a normal mood and affect. Her behavior is normal.  Vitals reviewed.      Assessment &  Plan:     1. Cough - Persistent cough for greater than a week, could be bronchitis versus pneumonia -Even abnormal breath sounds on examination today, we will get chest x-ray to rule out pneumonia -If this is bronchitis, and is likely viral -Rx for albuterol inhaler given -Patient is already taking chronic prednisone -If there is pneumonia present on chest x-ray, we will switch clindamycin to an antibiotic that provides better pulmonary coverage - DG Chest 2 View; Future  2. Wheeze - See plan above for cough including chest x-ray, albuterol, possible change in antibiotics - DG Chest 2 View; Future      The entirety of the information documented in the History of Present Illness, Review of Systems and Physical Exam were personally obtained by me. Portions of this information were initially documented by Irving Burton Ratchford, CMA and reviewed by me for thoroughness and accuracy.     Shirlee Latch, MD  [facility]

## 2016-10-25 NOTE — Telephone Encounter (Signed)
Pt advised and agrees with treatment plan. 

## 2016-10-25 NOTE — Patient Instructions (Signed)
Cough, Adult Coughing is a reflex that clears your throat and your airways. Coughing helps to heal and protect your lungs. It is normal to cough occasionally, but a cough that happens with other symptoms or lasts a long time may be a sign of a condition that needs treatment. A cough may last only 2-3 weeks (acute), or it may last longer than 8 weeks (chronic). What are the causes? Coughing is commonly caused by:  Breathing in substances that irritate your lungs.  A viral or bacterial respiratory infection.  Allergies.  Asthma.  Postnasal drip.  Smoking.  Acid backing up from the stomach into the esophagus (gastroesophageal reflux).  Certain medicines.  Chronic lung problems, including COPD (or rarely, lung cancer).  Other medical conditions such as heart failure.  Follow these instructions at home: Pay attention to any changes in your symptoms. Take these actions to help with your discomfort:  Take medicines only as told by your health care provider. ? If you were prescribed an antibiotic medicine, take it as told by your health care provider. Do not stop taking the antibiotic even if you start to feel better. ? Talk with your health care provider before you take a cough suppressant medicine.  Drink enough fluid to keep your urine clear or pale yellow.  If the air is dry, use a cold steam vaporizer or humidifier in your bedroom or your home to help loosen secretions.  Avoid anything that causes you to cough at work or at home.  If your cough is worse at night, try sleeping in a semi-upright position.  Avoid cigarette smoke. If you smoke, quit smoking. If you need help quitting, ask your health care provider.  Avoid caffeine.  Avoid alcohol.  Rest as needed.  Contact a health care provider if:  You have new symptoms.  You cough up pus.  Your cough does not get better after 2-3 weeks, or your cough gets worse.  You cannot control your cough with suppressant  medicines and you are losing sleep.  You develop pain that is getting worse or pain that is not controlled with pain medicines.  You have a fever.  You have unexplained weight loss.  You have night sweats. Get help right away if:  You cough up blood.  You have difficulty breathing.  Your heartbeat is very fast. This information is not intended to replace advice given to you by your health care provider. Make sure you discuss any questions you have with your health care provider. Document Released: 06/30/2010 Document Revised: 06/09/2015 Document Reviewed: 03/10/2014 Elsevier Interactive Patient Education  2017 [employer]  

## 2016-10-30 ENCOUNTER — Inpatient Hospital Stay: Payer: BLUE CROSS/BLUE SHIELD

## 2016-11-01 ENCOUNTER — Inpatient Hospital Stay: Payer: BLUE CROSS/BLUE SHIELD

## 2016-11-01 ENCOUNTER — Other Ambulatory Visit: Payer: Self-pay

## 2016-11-01 DIAGNOSIS — D693 Immune thrombocytopenic purpura: Secondary | ICD-10-CM

## 2016-11-01 LAB — CBC
HEMATOCRIT: 43.2 % (ref 35.0–47.0)
Hemoglobin: 14.5 g/dL (ref 12.0–16.0)
MCH: 28.3 pg (ref 26.0–34.0)
MCHC: 33.5 g/dL (ref 32.0–36.0)
MCV: 84.6 fL (ref 80.0–100.0)
Platelets: 266 10*3/uL (ref 150–440)
RBC: 5.11 MIL/uL (ref 3.80–5.20)
RDW: 14.4 % (ref 11.5–14.5)
WBC: 15.6 10*3/uL — ABNORMAL HIGH (ref 3.6–11.0)

## 2016-11-02 ENCOUNTER — Telehealth: Payer: Self-pay | Admitting: *Deleted

## 2016-11-02 MED ORDER — PREDNISONE 1 MG PO TABS: ORAL_TABLET | ORAL | 0 refills | Status: DC

## 2016-11-02 NOTE — Telephone Encounter (Signed)
Patient called and wants to ask about her prednisone. She had labs yesterday.   Dx:  Acute ITP (HCC)   Ref Range & Units 1d ago  WBC 3.6 - 11.0 K/uL 15.6    RBC 3.80 - 5.20 MIL/uL 5.11   Hemoglobin 12.0 - 16.0 g/dL 81.114.5   HCT 91.435.0 - 78.247.0 % 43.2   MCV 80.0 - 100.0 fL 84.6   MCH 26.0 - 34.0 pg 28.3   MCHC 32.0 - 36.0 g/dL 95.633.5   RDW 21.311.5 - 08.614.5 % 14.4   Platelets 150 - 440 K/uL 266

## 2016-11-02 NOTE — Telephone Encounter (Signed)
Patient called back and states she needs a refill on her Prednisone

## 2016-12-20 ENCOUNTER — Ambulatory Visit (INDEPENDENT_AMBULATORY_CARE_PROVIDER_SITE_OTHER): Payer: BLUE CROSS/BLUE SHIELD | Admitting: Family Medicine

## 2016-12-20 ENCOUNTER — Encounter: Payer: Self-pay | Admitting: Family Medicine

## 2016-12-20 VITALS — BP 130/84 | HR 80 | Temp 98.6°F | Resp 16 | Wt 169.0 lb

## 2016-12-20 DIAGNOSIS — D848 Other specified immunodeficiencies: Secondary | ICD-10-CM

## 2016-12-20 DIAGNOSIS — R059 Cough, unspecified: Secondary | ICD-10-CM

## 2016-12-20 DIAGNOSIS — T380X5A Adverse effect of glucocorticoids and synthetic analogues, initial encounter: Secondary | ICD-10-CM | POA: Diagnosis not present

## 2016-12-20 DIAGNOSIS — Z7952 Long term (current) use of systemic steroids: Secondary | ICD-10-CM | POA: Insufficient documentation

## 2016-12-20 DIAGNOSIS — J069 Acute upper respiratory infection, unspecified: Secondary | ICD-10-CM

## 2016-12-20 DIAGNOSIS — D84821 Immunodeficiency due to drugs: Secondary | ICD-10-CM

## 2016-12-20 DIAGNOSIS — R05 Cough: Secondary | ICD-10-CM | POA: Diagnosis not present

## 2016-12-20 MED ORDER — AZITHROMYCIN 250 MG PO TABS: ORAL_TABLET | ORAL | 0 refills | Status: AC

## 2016-12-20 NOTE — Progress Notes (Signed)
Patient: Julia Garner Female    DOB: 02/29/1950   66 y.o.   MRN: [id number] Visit Date: 12/20/2016  Today's Provider: Mila Merryonald Fisher, MD   Chief Complaint  Patient presents with  . Cough   Subjective:    Patient has had a cough for 6 weeks. Cough is non-productive. Patient saw Dr. Beryle FlockBacigalupo 10/25/2016 for cough and was given inhaler and chest x-ray was done showing normal. Patient states cough is no better. She has been in [place] for 5 weeks. Patient has some sob and wheezing. She has also been taking Symbicort and Claritin D with no relief. Also on prednisone.    Cough  This is a new problem. The current episode started more than 1 month ago (6 weeks). The problem has been unchanged. The cough is non-productive. Associated symptoms include headaches, myalgias and a sore throat. Pertinent negatives include no chest pain, chills, ear congestion, ear pain, fever, heartburn, hemoptysis, nasal congestion, postnasal drip, rash, rhinorrhea, shortness of breath, sweats, weight loss or wheezing.   States improved for about a week after last visit. States starting losing voice 5 days ago, followed by cough. Is on Claritin D and mucinex. Had two rounds of antibiotic (one was levofloxacin) and increased prednisone since then prescribed another MD, but symptoms only improved a few days. She is on chronic steroids for thrombocytopenia.    Patient Active Problem List   Diagnosis Date Noted  . Acute ITP (HCC) 09/16/2016  . Allergic rhinitis 06/29/2014  . Insomnia 06/29/2014  . Arthralgia of temporomandibular joint 06/29/2014  . Edema 06/29/2014  . Oral aphthae 07/08/2007  . 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 Arthropathy of hand 01/25/2007  . Fam hx-ischem heart disease 01/21/2007  . Menopausal and postmenopausal disorder 10/08/2005  . Tachycardia 10/08/2005  . Thrombocytopenia (HCC) 10/08/2005  . Obstructive sleep apnea of adult 10/24/2004  . Essential (primary) hypertension  01/16/2004  . Depression 01/15/2002       Allergies  Allergen Reactions  . Penicillins Itching    Hot flashes.  . Simcor  [Niacin-Simvastatin Er]     Flushing  . Tylenol  [Acetaminophen]      Current Outpatient Medications:  .  albuterol (PROVENTIL HFA;VENTOLIN HFA) 108 (90 Base) MCG/ACT inhaler, Inhale 2 puffs into the lungs every 6 (six) hours as needed for wheezing or shortness of breath., Disp: 1 Inhaler, Rfl: 0 .  azelastine (ASTELIN) 0.1 % nasal spray, Place 2 sprays into both nostrils 2 (two) times daily. Use in each nostril as directed, Disp: 30 mL, Rfl: 5 .  Bioflavonoid Products (VITAMIN C PLUS) 500 MG TABS, Take 1 tablet by mouth daily., Disp: , Rfl:  .  Calcium-Magnesium-Vitamin D (CALCIUM 500 PO), Take 1 tablet by mouth daily., Disp: , Rfl:  .  irbesartan-hydrochlorothiazide (AVALIDE) 150-12.5 MG tablet, TAKE 1 TABLET EVERY DAY, Disp: 90 tablet, Rfl: 4 .  LORATADINE-D 24HR 10-240 MG 24 hr tablet, TAKE 1 TABLET BY MOUTH DAILY AS NEEDED FOR ALLERGIES, Disp: 30 tablet, Rfl: 12 .  LORazepam (ATIVAN) 0.5 MG tablet, Take 0.5-1 tablets (0.25-0.5 mg total) by mouth 2 (two) times daily as needed., Disp: 12 tablet, Rfl: 0 .  montelukast (SINGULAIR) 10 MG tablet, Take 1 tablet (10 mg total) by mouth at bedtime., Disp: 90 tablet, Rfl: 3 .  naproxen (NAPROSYN) 500 MG tablet, Take 1 tablet by mouth 2 (two) times daily., Disp: , Rfl:  .  Omega 3 1200 MG CAPS, Take 2 capsules by mouth daily., Disp: ,  Rfl:  .  omeprazole (PRILOSEC) 20 MG capsule, Take 1 capsule (20 mg total) by mouth daily., Disp: 30 capsule, Rfl: 0 .  Potassium Chloride ER 20 MEQ TBCR, Take 1 tablet by mouth daily., Disp: 10 tablet, Rfl: 0 .  predniSONE (DELTASONE) 1 MG tablet, Take 10 tablets a day for 7 days then 5 tablets a day times 7 days, then 2 tabs a day times 7 days, then 1 tablet daily times 7 days then 0.5 tablet every other day times 7 days, then stop., Disp: 130 tablet, Rfl: 0 .  sertraline (ZOLOFT) 50 MG  tablet, TAKE 1 AND 1/2 TABLETS BY MOUTH EVERY MORNING, Disp: 135 tablet, Rfl: 2 .  simvastatin (ZOCOR) 40 MG tablet, TAKE ONE TABLET AT BEDTIME, Disp: 90 tablet, Rfl: 4 .  traZODone (DESYREL) 50 MG tablet, TAKE ONE OR TWO TABLETS AT BEDTIME, Disp: 180 tablet, Rfl: 4 .  clindamycin (CLEOCIN) 150 MG capsule, Take 1 capsule by mouth daily., Disp: , Rfl: 0 .  Glucosamine HCl 500 MG TABS, Take 1 tablet by mouth daily., Disp: , Rfl:   Review of Systems  Constitutional: Negative for appetite change, chills, fatigue, fever and weight loss.  HENT: Positive for congestion and sore throat. Negative for ear pain, postnasal drip and rhinorrhea.   Respiratory: Positive for cough and chest tightness. Negative for hemoptysis, shortness of breath and wheezing.   Cardiovascular: Negative for chest pain and palpitations.  Gastrointestinal: Negative for abdominal pain, heartburn, nausea and vomiting.  Musculoskeletal: Positive for myalgias.  Skin: Negative for rash.  Neurological: Positive for headaches. Negative for dizziness and weakness.    Social History   Tobacco Use  . Smoking status: Never Smoker  . Smokeless tobacco: Never Used  Substance Use Topics  . Alcohol use: No    Alcohol/week: 0.0 oz   Objective:   BP 130/84 (BP Location: Left Arm, Patient Position: Sitting, Cuff Size: Large)   Pulse 80   Temp 98.6 F (37 C) (Oral)   Resp 16   Wt 169 lb (76.7 kg)   SpO2 98%   BMI 31.67 kg/m  Vitals:   12/20/16 1127  BP: 130/84  Pulse: 80  Resp: 16  Temp: 98.6 F (37 C)  TempSrc: Oral  SpO2: 98%  Weight: 169 lb (76.7 kg)     Physical Exam  General Appearance:    Alert, cooperative, no distress  HENT:   bilateral TM normal without fluid or infection, neck without nodes, throat normal without erythema or exudate, sinuses nontender and nasal mucosa pale and congested  Eyes:    PERRL, conjunctiva/corneas clear, EOM's intact       Lungs:     Clear to auscultation bilaterally, respirations  unlabored  Heart:    Regular rate and rhythm  Neurologic:   Awake, alert, oriented x 3. No apparent focal neurological           defect.           Assessment & Plan:     1. Cough  - azithromycin (ZITHROMAX) 250 MG tablet; 2 by mouth today, then 1 daily for 4 days  Dispense: 6 tablet; Refill: 0  2. Upper respiratory tract infection, unspecified type   3. Immunocompromised due to corticosteroids (HCC)  - azithromycin (ZITHROMAX) 250 MG tablet; 2 by mouth today, then 1 daily for 4 days  Dispense: 6 tablet; Refill: 0        Mila Merryonald Fisher, MD  [facility]

## 2016-12-25 NOTE — Progress Notes (Signed)
Hematology/Oncology Follow up visit [facility] Telephone:(336) [phone] Fax:(336) [phone] Patient Care Team: Malva LimesFisher, Donald E, MD as PCP - General (Family Medicine)  REASON FOR VISIT Follow up for treatment of ITP  PERTINENT HEMATOLOGY HISTORY Julia Garner 66 y.o.  female with past medical history listed as below was referred by primary care provider Dr. Sherrie MustacheFisher to me for evaluation of thrombocytopenia. Patient had regular check up with primary care provider and had labs done on 08/22/2016. Platelet counts were slow at 15,000. Patient has a history of chronic ITP. Her last ITP attack prior than this one was 7 or 8 months ago per patient. Patient was called back, and started on a course of prednisone tapering. She have another CBC done on 08/27/2016. Platelet count recovers to 120,000. Patient reports that she was seen by Dr. Sherrlyn Hockpandit many many years ago but she stopped follow-up. She has a few ITP attacks per year. And when she noticed more bruising, she will call to primary care provider Dr. Sherrie MustacheFisher and got prednisone treatment. Denies any prior transfusion, alcohol use. She has another episode of ITP which was treated by PCP with a short course of prednisone. Platelet improved but decreased again. She was restarted on prednisone and platelet counts improved.  INTERVAL HISTORY Julia Garner is a 66 y.o. female who has above hematology history reviewed and edited by me today presents for follow up visit for management of ITP.  Problems and complaints are listed below: ITP: she finished steroid taper while in [place] and as instructed she took a blood test around November 7 and platelet count was 151,000. She then had a URI there and finished a short course of antibiotics and steroids. [place] to [place] on 12[place]5 and had another URI and just finished a course of Z pak.  Denies any bleeding events. Reports feeling tired and congested.   ROS:  Review of Systems    Constitutional: Positive for fatigue. Negative for appetite change.  HENT:   Negative for hearing loss.        Congested  Eyes: Negative for eye problems.  Respiratory: Negative for chest tightness.   Cardiovascular: Negative for chest pain.  Genitourinary: Negative for difficulty urinating and dysuria.   Musculoskeletal: Negative for arthralgias.  Skin: Negative for itching.  Neurological: Negative for dizziness.  Hematological: Negative for adenopathy.  Psychiatric/Behavioral: The patient is not nervous/anxious.     MEDICAL HISTORY:  Past Medical History:  Diagnosis Date  . Allergy   . Chronic ITP (idiopathic thrombocytopenia) (HCC)   . Head injuries   . History of seizure 2007   one seizure   . Hypertension     SURGICAL HISTORY: Past Surgical History:  Procedure Laterality Date  . CESAREAN SECTION  1990  . CHOLECYSTECTOMY  2005  . CYST EXCISION  1996   Tracheal Cyst    SOCIAL HISTORY: Social History   Socioeconomic History  . Marital status: Married    Spouse name: Not on file  . Number of children: 3  . Years of education: Not on file  . Highest education level: Not on file  Social Needs  . Financial resource strain: Not on file  . Food insecurity - worry: Not on file  . Food insecurity - inability: Not on file  . Transportation needs - medical: Not on file  . Transportation needs - non-medical: Not on file  Occupational History  . Occupation: retired    Comment: Adult nursehysical Therapist  Tobacco Use  . Smoking status: Never  Smoker  . Smokeless tobacco: Never Used  Substance and Sexual Activity  . Alcohol use: No    Alcohol/week: 0.0 oz  . Drug use: No  . Sexual activity: Not on file  Other Topics Concern  . Not on file  Social History Narrative  . Not on file    FAMILY HISTORY: Family History  Problem Relation Age of Onset  . Heart attack Father        1st MI at age 151    ALLERGIES:  is allergic to penicillins; simcor  [niacin-simvastatin er];  and tylenol  [acetaminophen].  MEDICATIONS:  Current Outpatient Medications  Medication Sig Dispense Refill  . albuterol (PROVENTIL HFA;VENTOLIN HFA) 108 (90 Base) MCG/ACT inhaler Inhale 2 puffs into the lungs every 6 (six) hours as needed for wheezing or shortness of breath. 1 Inhaler 0  . azelastine (ASTELIN) 0.1 % nasal spray Place 2 sprays into both nostrils 2 (two) times daily. Use in each nostril as directed 30 mL 5  . azithromycin (ZITHROMAX) 250 MG tablet 2 by mouth today, then 1 daily for 4 days 6 tablet 0  . Bioflavonoid Products (VITAMIN C PLUS) 500 MG TABS Take 1 tablet by mouth daily.    . Calcium-Magnesium-Vitamin D (CALCIUM 500 PO) Take 1 tablet by mouth daily.    . clindamycin (CLEOCIN) 150 MG capsule Take 1 capsule by mouth daily.  0  . Glucosamine HCl 500 MG TABS Take 1 tablet by mouth daily.    . irbesartan-hydrochlorothiazide (AVALIDE) 150-12.5 MG tablet TAKE 1 TABLET EVERY DAY 90 tablet 4  . LORATADINE-D 24HR 10-240 MG 24 hr tablet TAKE 1 TABLET BY MOUTH DAILY AS NEEDED FOR ALLERGIES 30 tablet 12  . LORazepam (ATIVAN) 0.5 MG tablet Take 0.5-1 tablets (0.25-0.5 mg total) by mouth 2 (two) times daily as needed. 12 tablet 0  . montelukast (SINGULAIR) 10 MG tablet Take 1 tablet (10 mg total) by mouth at bedtime. 90 tablet 3  . naproxen (NAPROSYN) 500 MG tablet Take 1 tablet by mouth 2 (two) times daily.    . Omega 3 1200 MG CAPS Take 2 capsules by mouth daily.    Marland Kitchen. omeprazole (PRILOSEC) 20 MG capsule Take 1 capsule (20 mg total) by mouth daily. 30 capsule 0  . Potassium Chloride ER 20 MEQ TBCR Take 1 tablet by mouth daily. 10 tablet 0  . predniSONE (DELTASONE) 1 MG tablet Take 10 tablets a day for 7 days then 5 tablets a day times 7 days, then 2 tabs a day times 7 days, then 1 tablet daily times 7 days then 0.5 tablet every other day times 7 days, then stop. 130 tablet 0  . sertraline (ZOLOFT) 50 MG tablet TAKE 1 AND 1/2 TABLETS BY MOUTH EVERY MORNING 135 tablet 2  .  simvastatin (ZOCOR) 40 MG tablet TAKE ONE TABLET AT BEDTIME 90 tablet 4  . traZODone (DESYREL) 50 MG tablet TAKE ONE OR TWO TABLETS AT BEDTIME 180 tablet 4   No current facility-administered medications for this visit.       Marland Kitchen.  PHYSICAL EXAMINATION: ECOG PERFORMANCE STATUS: 1 - Symptomatic but completely ambulatory Vitals:   12/26/16 1015  BP: 113/81  Pulse: (!) 109  Resp: 16  Temp: 98.5 F (36.9 C)   Filed Weights   12/26/16 1015 12/26/16 1019  Weight: 168 lb (76.2 kg) 166 lb (75.3 kg)    GENERAL: No distress, well nourished.  SKIN:  No rashes or significant lesions  HEAD: Normocephalic, No masses, lesions, tenderness  or abnormalities  EYES: Conjunctiva are pink, non icteric ENT: External ears normal ,lips , buccal mucosa, and tongue normal and mucous membranes are moist  LYMPH: No palpable cervical and axillary lymphadenopathy  LUNGS: Clear to auscultation, no crackles or wheezes HEART: Regular rate & rhythm, no murmurs, no gallops, S1 normal and S2 normal  ABDOMEN: Abdomen soft, non-tender, normal bowel sounds, I did not appreciate any  masses or organomegaly  MUSCULOSKELETAL: No CVA tenderness and no tenderness on percussion of the back or rib cage.  EXTREMITIES: No edema, no skin discoloration or tenderness NEURO: Alert & oriented, no focal motor/sensory deficits.    LABORATORY DATA:  I have reviewed the data as listed Lab Results  Component Value Date   WBC 15.6 (H) 11/01/2016   HGB 14.5 11/01/2016   HCT 43.2 11/01/2016   MCV 84.6 11/01/2016   PLT 266 11/01/2016   Recent Labs    09/10/16 0908 09/18/16 0902 10/16/16 1057  NA 133* 137 133*  K 3.3* 3.1* 3.3*  CL 99* 98* 97*  CO2 26 30 26   GLUCOSE 138* 105* 127*  BUN 13 16 17   CREATININE 0.73 0.75 0.72  CALCIUM 8.7* 8.5* 9.0  GFRNONAA >60 >60 >60  GFRAA >60 >60 >60  PROT 7.5 7.0  --   ALBUMIN 3.7 3.4*  --   AST 28 27  --   ALT 28 27  --   ALKPHOS 60 52  --   BILITOT 0.6 0.7  --    positive  ANA  RADIOGRAPHIC STUDIES: I have personally reviewed the radiological images as listed and agreed with the findings in the report.   ultrasound of the abdomen shoed no spleenmegaly. Fatty liver disease.   ASSESSMENT & PLAN:  1. Thrombocytopenia (HCC)    1 Recurrent ITP, likely secondary to autoimmune disorder. ANA was positive. Will need to contact her rheumatologist and see if possible treatment of her underlying autoimmune disease.  Platelet less than 30,000, will give Dexamethasone 40mg  x 4 day.  May need to consider Rituximab for consolidation.   2 Hypokalemia 3  positive ANA. Awaiting records from Rheumatology.    All questions were answered. The patient knows to call the clinic with any problems questions or concerns. Follow up in 1 week.Rickard Patience, MD, PhD Hematology Oncology [facility] at [facility] Pager- [phone] 12/25/2016

## 2016-12-26 ENCOUNTER — Inpatient Hospital Stay (HOSPITAL_BASED_OUTPATIENT_CLINIC_OR_DEPARTMENT_OTHER): Payer: BLUE CROSS/BLUE SHIELD | Admitting: Oncology

## 2016-12-26 ENCOUNTER — Encounter: Payer: Self-pay | Admitting: Oncology

## 2016-12-26 ENCOUNTER — Inpatient Hospital Stay: Payer: BLUE CROSS/BLUE SHIELD | Attending: Oncology

## 2016-12-26 VITALS — BP 113/81 | HR 109 | Temp 98.5°F | Resp 16 | Wt 166.0 lb

## 2016-12-26 DIAGNOSIS — D693 Immune thrombocytopenic purpura: Secondary | ICD-10-CM

## 2016-12-26 DIAGNOSIS — E876 Hypokalemia: Secondary | ICD-10-CM | POA: Insufficient documentation

## 2016-12-26 DIAGNOSIS — Z880 Allergy status to penicillin: Secondary | ICD-10-CM | POA: Diagnosis not present

## 2016-12-26 DIAGNOSIS — Z7952 Long term (current) use of systemic steroids: Secondary | ICD-10-CM | POA: Insufficient documentation

## 2016-12-26 DIAGNOSIS — I10 Essential (primary) hypertension: Secondary | ICD-10-CM

## 2016-12-26 DIAGNOSIS — R5383 Other fatigue: Secondary | ICD-10-CM | POA: Diagnosis not present

## 2016-12-26 DIAGNOSIS — R760 Raised antibody titer: Secondary | ICD-10-CM

## 2016-12-26 DIAGNOSIS — Z79899 Other long term (current) drug therapy: Secondary | ICD-10-CM | POA: Insufficient documentation

## 2016-12-26 DIAGNOSIS — K760 Fatty (change of) liver, not elsewhere classified: Secondary | ICD-10-CM | POA: Insufficient documentation

## 2016-12-26 DIAGNOSIS — R509 Fever, unspecified: Secondary | ICD-10-CM | POA: Insufficient documentation

## 2016-12-26 DIAGNOSIS — D696 Thrombocytopenia, unspecified: Secondary | ICD-10-CM

## 2016-12-26 LAB — COMPREHENSIVE METABOLIC PANEL
ALK PHOS: 67 U/L (ref 38–126)
ALT: 34 U/L (ref 14–54)
AST: 30 U/L (ref 15–41)
Albumin: 3.7 g/dL (ref 3.5–5.0)
Anion gap: 9 (ref 5–15)
BUN: 17 mg/dL (ref 6–20)
CALCIUM: 9.1 mg/dL (ref 8.9–10.3)
CO2: 28 mmol/L (ref 22–32)
CREATININE: 0.72 mg/dL (ref 0.44–1.00)
Chloride: 100 mmol/L — ABNORMAL LOW (ref 101–111)
Glucose, Bld: 118 mg/dL — ABNORMAL HIGH (ref 65–99)
Potassium: 3.3 mmol/L — ABNORMAL LOW (ref 3.5–5.1)
Sodium: 137 mmol/L (ref 135–145)
Total Bilirubin: 0.6 mg/dL (ref 0.3–1.2)
Total Protein: 8.4 g/dL — ABNORMAL HIGH (ref 6.5–8.1)

## 2016-12-26 LAB — LACTATE DEHYDROGENASE: LDH: 171 U/L (ref 98–192)

## 2016-12-26 LAB — DIFFERENTIAL
BASOS ABS: 0 10*3/uL (ref 0.0–0.1)
BASOS PCT: 1 %
EOS ABS: 0.5 10*3/uL (ref 0.0–0.7)
EOS PCT: 6 %
Lymphocytes Relative: 32 %
Lymphs Abs: 2.8 10*3/uL (ref 0.7–4.0)
MONOS PCT: 8 %
Monocytes Absolute: 0.7 10*3/uL (ref 0.1–1.0)
NEUTROS PCT: 53 %
Neutro Abs: 4.5 10*3/uL (ref 1.7–7.7)

## 2016-12-26 LAB — CBC
HEMATOCRIT: 40.7 % (ref 35.0–47.0)
Hemoglobin: 13.7 g/dL (ref 12.0–16.0)
MCH: 28.7 pg (ref 26.0–34.0)
MCHC: 33.8 g/dL (ref 32.0–36.0)
MCV: 84.9 fL (ref 80.0–100.0)
Platelets: 16 10*3/uL — CL (ref 150–440)
RBC: 4.8 MIL/uL (ref 3.80–5.20)
RDW: 13.6 % (ref 11.5–14.5)
WBC: 8.2 10*3/uL (ref 3.6–11.0)

## 2016-12-26 MED ORDER — DEXAMETHASONE 4 MG PO TABS: 4.0000 mg | ORAL_TABLET | Freq: Every day | ORAL | 0 refills | Status: DC

## 2016-12-26 NOTE — Progress Notes (Signed)
Patient here for follow up with labs today. She states that she just finished her third round of antibiotics for URI. She continues to have cough and congestion, and intermittent laryngitis.

## 2016-12-27 ENCOUNTER — Ambulatory Visit (INDEPENDENT_AMBULATORY_CARE_PROVIDER_SITE_OTHER): Payer: BLUE CROSS/BLUE SHIELD | Admitting: Family Medicine

## 2016-12-27 DIAGNOSIS — Z23 Encounter for immunization: Secondary | ICD-10-CM | POA: Diagnosis not present

## 2016-12-27 LAB — HEPATITIS B SURFACE ANTIGEN: Hepatitis B Surface Ag: NEGATIVE

## 2016-12-28 ENCOUNTER — Ambulatory Visit: Payer: Self-pay | Admitting: Family Medicine

## 2016-12-31 LAB — COMP PANEL: LEUKEMIA/LYMPHOMA

## 2016-12-31 LAB — IMMUNOGLOBULINS A/E/G/M, SERUM
IGA: 394 mg/dL — AB (ref 87–352)
IGM (IMMUNOGLOBULIN M), SRM: 84 mg/dL (ref 26–217)
IgE (Immunoglobulin E), Serum: 15 IU/mL (ref 0–100)
IgG (Immunoglobin G), Serum: 2163 mg/dL — ABNORMAL HIGH (ref 700–1600)

## 2017-01-01 NOTE — Progress Notes (Signed)
Hematology/Oncology Follow up visit Julia [facility]ner Telephone:(336) [phone] Fax:(336) [phone] Patient Care Team: Birdie Sons, MD as PCP - General (Family Medicine)  REASON FOR VISIT Follow up for treatment of thrombocytopenia  PERTINENT HEMATOLOGY HISTORY Julia Garner 66 y.o.  female with past medical history listed as below was referred by primary care provider Dr. Caryn Section to me for evaluation of thrombocytopenia. Patient had regular check up with primary care provider and had labs done on 08/22/2016. Platelet counts were slow at 15,000. Patient has a history of chronic ITP. Her last ITP attack prior than this one was 7 or 8 months ago per patient. Patient was called back, and started on a course of prednisone tapering. She have another CBC done on 08/27/2016. Platelet count recovers to 120,000. Patient reports that she was seen by Dr. Ma Hillock many many years ago but she stopped follow-up. She has a few ITP attacks per year. And when she noticed more bruising, she will call to primary care provider Dr. Caryn Section and got prednisone treatment. Denies any prior transfusion, alcohol use. She has another episode of ITP which was treated by PCP with a short course of prednisone. Platelet improved but decreased again. She was restarted on prednisone and platelet counts improved.   INTERVAL HISTORY Julia Garner is a 66 y.o. female who has above hematology history reviewed and edited by me today presents for follow up visit for management of ITP.  She finished a course of Z pak recently for upper respiratory infection. She also finished a course of high dose Dexamethasone '40mg'$  daily x 4.  She reports not feeling well recently however, today feels better. She has low grade fever in the clinic, 100.7. Denies any dyuria, cough, congestion. He son accompanied her to clinic today.   ROS:  Review of Systems  Constitutional: Positive for fatigue. Negative for appetite change.    HENT:   Negative for hearing loss and lump/mass.   Eyes: Negative for eye problems and icterus.  Respiratory: Negative for chest tightness.   Cardiovascular: Negative for chest pain and leg swelling.  Gastrointestinal: Negative for abdominal distention.  Genitourinary: Negative for difficulty urinating, dysuria and frequency.   Musculoskeletal: Negative for arthralgias.  Skin: Negative for itching.  Neurological: Negative for dizziness and headaches.  Hematological: Negative for adenopathy.  Psychiatric/Behavioral: The patient is not nervous/anxious.     MEDICAL HISTORY:  Past Medical History:  Diagnosis Date  . Allergy   . Chronic ITP (idiopathic thrombocytopenia) (HCC)   . Head injuries   . History of seizure 2007   one seizure   . Hypertension     SURGICAL HISTORY: Past Surgical History:  Procedure Laterality Date  . CESAREAN SECTION  1990  . CHOLECYSTECTOMY  2005  . CYST EXCISION  1996   Tracheal Cyst    SOCIAL HISTORY: Social History   Socioeconomic History  . Marital status: Married    Spouse name: Not on file  . Number of children: 3  . Years of education: Not on file  . Highest education level: Not on file  Social Needs  . Financial resource strain: Not on file  . Food insecurity - worry: Not on file  . Food insecurity - inability: Not on file  . Transportation needs - medical: Not on file  . Transportation needs - non-medical: Not on file  Occupational History  . Occupation: retired    Comment: Community education officer  Tobacco Use  . Smoking status: Never Smoker  . Smokeless tobacco: Never  Used  Substance and Sexual Activity  . Alcohol use: No    Alcohol/week: 0.0 oz  . Drug use: No  . Sexual activity: Not on file  Other Topics Concern  . Not on file  Social History Narrative  . Not on file    FAMILY HISTORY: Family History  Problem Relation Age of Onset  . Heart attack Father        1st MI at age 4    ALLERGIES:  is allergic to  penicillins; simcor  [niacin-simvastatin er]; and tylenol  [acetaminophen].  MEDICATIONS:  Current Outpatient Medications  Medication Sig Dispense Refill  . albuterol (PROVENTIL HFA;VENTOLIN HFA) 108 (90 Base) MCG/ACT inhaler Inhale 2 puffs into the lungs every 6 (six) hours as needed for wheezing or shortness of breath. 1 Inhaler 0  . azelastine (ASTELIN) 0.1 % nasal spray Place 2 sprays into both nostrils 2 (two) times daily. Use in each nostril as directed (Patient not taking: Reported on 12/26/2016) 30 mL 5  . Bioflavonoid Products (VITAMIN C PLUS) 500 MG TABS Take 1 tablet by mouth daily.    . budesonide-formoterol (SYMBICORT) 80-4.5 MCG/ACT inhaler Inhale 2 puffs into the lungs 2 (two) times daily.    . Calcium Carbonate-Vit D-Min (CALCIUM 1200 PO) Take by mouth.    . dexamethasone (DECADRON) 4 MG tablet Take 1 tablet (4 mg total) by mouth daily. 40 tablet 0  . irbesartan-hydrochlorothiazide (AVALIDE) 150-12.5 MG tablet TAKE 1 TABLET EVERY DAY 90 tablet 4  . LORATADINE-D 24HR 10-240 MG 24 hr tablet TAKE 1 TABLET BY MOUTH DAILY AS NEEDED FOR ALLERGIES 30 tablet 12  . LORazepam (ATIVAN) 0.5 MG tablet Take 0.5-1 tablets (0.25-0.5 mg total) by mouth 2 (two) times daily as needed. 12 tablet 0  . naproxen (NAPROSYN) 500 MG tablet Take 1 tablet by mouth 2 (two) times daily.    . Omega 3 1200 MG CAPS Take 2 capsules by mouth daily.    . sertraline (ZOLOFT) 50 MG tablet TAKE 1 AND 1/2 TABLETS BY MOUTH EVERY MORNING 135 tablet 2  . simvastatin (ZOCOR) 40 MG tablet TAKE ONE TABLET AT BEDTIME 90 tablet 4  . traZODone (DESYREL) 50 MG tablet TAKE ONE OR TWO TABLETS AT BEDTIME 180 tablet 4   No current facility-administered medications for this visit.       Marland Kitchen  PHYSICAL EXAMINATION: ECOG PERFORMANCE STATUS: 1 - Symptomatic but completely ambulatory Vitals:   01/02/17 1438  BP: 118/82  Pulse: (!) 104  Resp: 20  Temp: (!) 100.7 F (38.2 C)  SpO2: 96%   Filed Weights   01/02/17 1438    Weight: 164 lb (74.4 kg)    GENERAL: No distress, well nourished.  SKIN:  No rashes or significant lesions  HEAD: Normocephalic, No masses, lesions, tenderness or abnormalities  EYES: Conjunctiva are pink, non icteric ENT: External ears normal ,lips , buccal mucosa, and tongue normal and mucous membranes are moist  LYMPH: No palpable cervical and axillary lymphadenopathy  LUNGS: Clear to auscultation, no crackles or wheezes HEART: Regular rate & rhythm, no murmurs, no gallops, S1 normal and S2 normal  ABDOMEN: Abdomen soft, non-tender, normal bowel sounds, I did not appreciate any  masses or organomegaly  MUSCULOSKELETAL: No CVA tenderness and no tenderness on percussion of the back or rib cage.  EXTREMITIES: No edema, no skin discoloration or tenderness NEURO: Alert & oriented, no focal motor/sensory deficits.      LABORATORY DATA:  I have reviewed the data as listed Lab Results  Component Value Date   WBC 8.2 12/26/2016   HGB 13.7 12/26/2016   HCT 40.7 12/26/2016   MCV 84.9 12/26/2016   PLT 16 (LL) 12/26/2016   Recent Labs    09/10/16 0908 09/18/16 0902 10/16/16 1057 12/26/16 1129  NA 133* 137 133* 137  K 3.3* 3.1* 3.3* 3.3*  CL 99* 98* 97* 100*  CO2 _0 GLUCOSE 138* 105* 127* 118*  BUN _1 CREATININE 0.73 0.75 0.72 0.72  CALCIUM 8.7* 8.5* 9.0 9.1  GFRNONAA >60 >60 >60 >60  GFRAA >60 >60 >60 >60  PROT 7.5 7.0  --  8.4*  ALBUMIN 3.7 3.4*  --  3.7  AST 28 27  --  30  ALT 28 27  --  34  ALKPHOS 60 52  --  67  BILITOT 0.6 0.7  --  0.6   positive ANA Flowcytometry showed 1% clonal B cell population   RADIOGRAPHIC STUDIES: I have personally reviewed the radiological images as listed and agreed with the findings in the report.   ultrasound of the abdomen shoed no spleenmegaly. Fatty liver disease.   ASSESSMENT & PLAN:  1. Thrombocytopenia (HCC)    1 Recurrent thrombocytopenia. Responded to dexamethasone.  Flowcytometry results discussed  with patient, she may have underlying lymphoproliferative disease . Will order CT chest abdomen pelvis to evaluate if any lymphadenopathy. .  As she just finish a course of steroids, will hold on bone marrow now. Plan Bone marrow biopsy in 3 weeks  Check cbc weekly.  2 Febrile illness, etiology unclear. Just finish a course of antibiotics. Infectious (viral,etc) vs manifestation of lymphoma symptoms?   Tylenol PRN.    All questions were answered. The patient knows to call the clinic with any problems questions or concerns. Follow up in 2 week.Earlie Server, MD, PhD Hematology Oncology [facility] at [facility] Pager- [phone] 01/02/2017

## 2017-01-02 ENCOUNTER — Inpatient Hospital Stay (HOSPITAL_BASED_OUTPATIENT_CLINIC_OR_DEPARTMENT_OTHER): Payer: BLUE CROSS/BLUE SHIELD | Admitting: Oncology

## 2017-01-02 ENCOUNTER — Encounter: Payer: Self-pay | Admitting: Oncology

## 2017-01-02 ENCOUNTER — Inpatient Hospital Stay: Payer: BLUE CROSS/BLUE SHIELD

## 2017-01-02 VITALS — BP 118/82 | HR 104 | Temp 100.7°F | Resp 20 | Wt 164.0 lb

## 2017-01-02 DIAGNOSIS — R5383 Other fatigue: Secondary | ICD-10-CM | POA: Diagnosis not present

## 2017-01-02 DIAGNOSIS — D696 Thrombocytopenia, unspecified: Secondary | ICD-10-CM

## 2017-01-02 DIAGNOSIS — Z7952 Long term (current) use of systemic steroids: Secondary | ICD-10-CM | POA: Diagnosis not present

## 2017-01-02 DIAGNOSIS — R509 Fever, unspecified: Secondary | ICD-10-CM | POA: Diagnosis not present

## 2017-01-02 DIAGNOSIS — I10 Essential (primary) hypertension: Secondary | ICD-10-CM | POA: Diagnosis not present

## 2017-01-02 DIAGNOSIS — K760 Fatty (change of) liver, not elsewhere classified: Secondary | ICD-10-CM | POA: Diagnosis not present

## 2017-01-02 DIAGNOSIS — Z880 Allergy status to penicillin: Secondary | ICD-10-CM

## 2017-01-02 DIAGNOSIS — Z79899 Other long term (current) drug therapy: Secondary | ICD-10-CM

## 2017-01-02 DIAGNOSIS — D693 Immune thrombocytopenic purpura: Secondary | ICD-10-CM

## 2017-01-02 LAB — CBC WITH DIFFERENTIAL/PLATELET
Basophils Absolute: 0.1 10*3/uL (ref 0–0.1)
Basophils Relative: 0 %
EOS PCT: 3 %
Eosinophils Absolute: 0.4 10*3/uL (ref 0–0.7)
HCT: 44.3 % (ref 35.0–47.0)
HEMOGLOBIN: 14.9 g/dL (ref 12.0–16.0)
LYMPHS ABS: 3.8 10*3/uL — AB (ref 1.0–3.6)
Lymphocytes Relative: 29 %
MCH: 28.5 pg (ref 26.0–34.0)
MCHC: 33.5 g/dL (ref 32.0–36.0)
MCV: 84.9 fL (ref 80.0–100.0)
MONOS PCT: 9 %
Monocytes Absolute: 1.2 10*3/uL — ABNORMAL HIGH (ref 0.2–0.9)
NEUTROS PCT: 59 %
Neutro Abs: 7.5 10*3/uL — ABNORMAL HIGH (ref 1.4–6.5)
Platelets: 286 10*3/uL (ref 150–440)
RBC: 5.22 MIL/uL — ABNORMAL HIGH (ref 3.80–5.20)
RDW: 13.7 % (ref 11.5–14.5)
WBC: 13 10*3/uL — ABNORMAL HIGH (ref 3.6–11.0)

## 2017-01-04 ENCOUNTER — Other Ambulatory Visit: Payer: Self-pay | Admitting: Family Medicine

## 2017-01-09 ENCOUNTER — Inpatient Hospital Stay: Payer: BLUE CROSS/BLUE SHIELD

## 2017-01-09 ENCOUNTER — Other Ambulatory Visit: Payer: Self-pay | Admitting: Oncology

## 2017-01-09 DIAGNOSIS — D696 Thrombocytopenia, unspecified: Secondary | ICD-10-CM

## 2017-01-09 LAB — CBC WITH DIFFERENTIAL/PLATELET
BASOS PCT: 1 %
Basophils Absolute: 0.1 10*3/uL (ref 0–0.1)
Eosinophils Absolute: 0.4 10*3/uL (ref 0–0.7)
Eosinophils Relative: 5 %
HEMATOCRIT: 38.7 % (ref 35.0–47.0)
HEMOGLOBIN: 13 g/dL (ref 12.0–16.0)
LYMPHS ABS: 2.2 10*3/uL (ref 1.0–3.6)
LYMPHS PCT: 30 %
MCH: 28.7 pg (ref 26.0–34.0)
MCHC: 33.5 g/dL (ref 32.0–36.0)
MCV: 85.5 fL (ref 80.0–100.0)
MONOS PCT: 8 %
Monocytes Absolute: 0.6 10*3/uL (ref 0.2–0.9)
NEUTROS ABS: 4.1 10*3/uL (ref 1.4–6.5)
NEUTROS PCT: 56 %
Platelets: 28 10*3/uL — CL (ref 150–400)
RBC: 4.53 MIL/uL (ref 3.80–5.20)
RDW: 13.9 % (ref 11.5–14.5)
WBC: 7.4 10*3/uL (ref 3.6–11.0)

## 2017-01-10 ENCOUNTER — Other Ambulatory Visit: Payer: BLUE CROSS/BLUE SHIELD

## 2017-01-10 LAB — PROTEIN ELECTROPHORESIS, SERUM
A/G RATIO SPE: 0.9 (ref 0.7–1.7)
ALBUMIN ELP: 3.1 g/dL (ref 2.9–4.4)
ALPHA-1-GLOBULIN: 0.2 g/dL (ref 0.0–0.4)
ALPHA-2-GLOBULIN: 0.7 g/dL (ref 0.4–1.0)
Beta Globulin: 1.1 g/dL (ref 0.7–1.3)
GLOBULIN, TOTAL: 3.4 g/dL (ref 2.2–3.9)
Gamma Globulin: 1.4 g/dL (ref 0.4–1.8)
Total Protein ELP: 6.5 g/dL (ref 6.0–8.5)

## 2017-01-10 LAB — KAPPA/LAMBDA LIGHT CHAINS
KAPPA FREE LGHT CHN: 19.6 mg/L — AB (ref 3.3–19.4)
KAPPA, LAMDA LIGHT CHAIN RATIO: 1.63 (ref 0.26–1.65)
LAMDA FREE LIGHT CHAINS: 12 mg/L (ref 5.7–26.3)

## 2017-01-10 LAB — BETA 2 MICROGLOBULIN, SERUM: BETA 2 MICROGLOBULIN: 1.8 mg/L (ref 0.6–2.4)

## 2017-01-14 ENCOUNTER — Ambulatory Visit: Payer: BLUE CROSS/BLUE SHIELD

## 2017-01-16 ENCOUNTER — Inpatient Hospital Stay: Payer: BLUE CROSS/BLUE SHIELD

## 2017-01-16 ENCOUNTER — Inpatient Hospital Stay: Payer: BLUE CROSS/BLUE SHIELD | Admitting: Oncology

## 2017-01-17 ENCOUNTER — Ambulatory Visit: Payer: BLUE CROSS/BLUE SHIELD

## 2017-01-17 ENCOUNTER — Ambulatory Visit
Admission: RE | Admit: 2017-01-17 | Discharge: 2017-01-17 | Disposition: A | Discharge status: Home / Self Care | Payer: BLUE CROSS/BLUE SHIELD | Source: Ambulatory Visit | Attending: Oncology | Admitting: Oncology

## 2017-01-17 DIAGNOSIS — I700 Atherosclerosis of aorta: Secondary | ICD-10-CM | POA: Diagnosis not present

## 2017-01-17 DIAGNOSIS — N8189 Other female genital prolapse: Secondary | ICD-10-CM | POA: Insufficient documentation

## 2017-01-17 DIAGNOSIS — I2584 Coronary atherosclerosis due to calcified coronary lesion: Secondary | ICD-10-CM | POA: Insufficient documentation

## 2017-01-17 DIAGNOSIS — K5641 Fecal impaction: Secondary | ICD-10-CM | POA: Diagnosis not present

## 2017-01-17 DIAGNOSIS — Z9049 Acquired absence of other specified parts of digestive tract: Secondary | ICD-10-CM | POA: Diagnosis not present

## 2017-01-17 DIAGNOSIS — D696 Thrombocytopenia, unspecified: Secondary | ICD-10-CM | POA: Diagnosis present

## 2017-01-17 DIAGNOSIS — N8110 Cystocele, unspecified: Secondary | ICD-10-CM | POA: Insufficient documentation

## 2017-01-17 MED ORDER — IOPAMIDOL (ISOVUE-300) INJECTION 61%
100.0000 mL | Freq: Once | INTRAVENOUS | Status: AC | PRN
  Administered 2017-01-17: 100 mL via INTRAVENOUS

## 2017-01-17 NOTE — Progress Notes (Signed)
Hematology/Oncology Follow up visit [facility] [phone] [phone] Patient Care Team: Birdie Sons, MD as PCP - General (Family Medicine)  REASON FOR VISIT Follow up for treatment of thrombocytopenia  PERTINENT HEMATOLOGY HISTORY Julia Garner 67 y.o.  female with past medical history listed as below was referred by primary care provider Dr. Caryn Section to me for evaluation of thrombocytopenia. Patient had regular check up with primary care provider and had labs done on 08/22/2016. Platelet counts were slow at 15,000. Patient has a history of chronic ITP. Her last ITP attack prior than this one was 7 or 8 months ago per patient. Patient was called back, and started on a course of prednisone tapering. She have another CBC done on 08/27/2016. Platelet count recovers to 120,000. Patient reports that she was seen by Dr. Ma Hillock many many years ago but she stopped follow-up. She has a few ITP attacks per year. And when she noticed more bruising, she will call to primary care provider Dr. Caryn Section and got prednisone treatment. Denies any prior transfusion, alcohol use. She has another episode of ITP which was treated by PCP with a short course of prednisone. Platelet improved but decreased again. She was restarted on prednisone and platelet counts improved.   INTERVAL HISTORY Julia Garner is a 67 y.o. female who has above hematology history reviewed and edited by me today presents for follow up visit for management of ITP. Patient reports feeling better today, less fatigued. She feels that she is recovering from her most recent upper respiratory infection. Denies any active bleeding or easy bruising.   ROS:  Review of Systems  Constitutional: Negative for appetite change, chills and fatigue.  HENT:   Negative for hearing loss, lump/mass and nosebleeds.   Eyes: Negative for eye problems and icterus.  Respiratory: Negative for chest tightness and  cough.   Cardiovascular: Negative for chest pain and leg swelling.  Gastrointestinal: Negative for abdominal distention.  Genitourinary: Negative for difficulty urinating, dyspareunia, dysuria and frequency.   Musculoskeletal: Negative for arthralgias and gait problem.  Skin: Negative for itching.  Neurological: Negative for dizziness, gait problem and headaches.  Hematological: Negative for adenopathy. Does not bruise/bleed easily.  Psychiatric/Behavioral: Negative for confusion. The patient is not nervous/anxious.     MEDICAL HISTORY:  Past Medical History:  Diagnosis Date  . Allergy   . Chronic ITP (idiopathic thrombocytopenia) (HCC)   . Head injuries   . History of seizure 2007   one seizure   . Hypertension     SURGICAL HISTORY: Past Surgical History:  Procedure Laterality Date  . CESAREAN SECTION  1990  . CHOLECYSTECTOMY  2005  . CYST EXCISION  1996   Tracheal Cyst    SOCIAL HISTORY: Social History   Socioeconomic History  . Marital status: Married    Spouse name: Not on file  . Number of children: 3  . Years of education: Not on file  . Highest education level: Not on file  Social Needs  . Financial resource strain: Not on file  . Food insecurity - worry: Not on file  . Food insecurity - inability: Not on file  . Transportation needs - medical: Not on file  . Transportation needs - non-medical: Not on file  Occupational History  . Occupation: retired    Comment: Community education officer  Tobacco Use  . Smoking status: Never Smoker  . Smokeless tobacco: Never Used  Substance and Sexual Activity  . Alcohol use: No    Alcohol/week: 0.0  oz  . Drug use: No  . Sexual activity: Not on file  Other Topics Concern  . Not on file  Social History Narrative  . Not on file    FAMILY HISTORY: Family History  Problem Relation Age of Onset  . Heart attack Father        1st MI at age 65    ALLERGIES:  is allergic to penicillins; simcor  [niacin-simvastatin er];  and tylenol  [acetaminophen].  MEDICATIONS:  Current Outpatient Medications  Medication Sig Dispense Refill  . albuterol (PROVENTIL HFA;VENTOLIN HFA) 108 (90 Base) MCG/ACT inhaler Inhale 2 puffs into the lungs every 6 (six) hours as needed for wheezing or shortness of breath. 1 Inhaler 0  . Bioflavonoid Products (VITAMIN C PLUS) 500 MG TABS Take 2 tablets by mouth daily.     . budesonide-formoterol (SYMBICORT) 80-4.5 MCG/ACT inhaler Inhale 2 puffs into the lungs 2 (two) times daily.    . Calcium Carbonate-Vit D-Min (CALCIUM 1200 PO) Take by mouth.    . irbesartan-hydrochlorothiazide (AVALIDE) 150-12.5 MG tablet TAKE 1 TABLET EVERY DAY 90 tablet 4  . LORATADINE-D 24HR 10-240 MG 24 hr tablet TAKE 1 TABLET BY MOUTH DAILY AS NEEDED FOR ALLERGIES 30 tablet 12  . Naproxen Sodium (ALEVE) 220 MG CAPS Take by mouth daily as needed.    . Omega 3 1200 MG CAPS Take 2 capsules by mouth daily.    . sertraline (ZOLOFT) 50 MG tablet TAKE 1 AND 1/2 TABLETS BY MOUTH EVERY MORNING 135 tablet 2  . simvastatin (ZOCOR) 40 MG tablet TAKE 1 TABLET BY MOUTH AT BEDTIME 90 tablet 4  . traZODone (DESYREL) 50 MG tablet TAKE ONE OR TWO TABLETS AT BEDTIME 180 tablet 4   No current facility-administered medications for this visit.       Marland Kitchen  PHYSICAL EXAMINATION: ECOG PERFORMANCE STATUS: 1 - Symptomatic but completely ambulatory Vitals:   01/18/17 1436  BP: 116/76  Pulse: 95  Resp: 12  Temp: (!) 97.2 F (36.2 C)   Filed Weights   01/18/17 1436  Weight: 166 lb 8 oz (75.5 kg)  GENERAL: No distress, well nourished.  SKIN:  No rashes or significant lesions  HEAD: Normocephalic, No masses, lesions, tenderness or abnormalities  EYES: Conjunctiva are pink, non icteric ENT: External ears normal ,lips , buccal mucosa, and tongue normal and mucous membranes are moist  LYMPH: No palpable cervical and axillary lymphadenopathy  LUNGS: Clear to auscultation, no crackles or wheezes HEART: Regular rate & rhythm, no  murmurs, no gallops, S1 normal and S2 normal  ABDOMEN: Abdomen soft, non-tender, normal bowel sounds, MUSCULOSKELETAL: No CVA tenderness and no tenderness on percussion of the back or rib cage.  EXTREMITIES: No edema, no skin discoloration or tenderness NEURO: Alert & oriented, no focal motor/sensory deficits.       LABORATORY DATA:  I have reviewed the data as listed Lab Results  Component Value Date   WBC 7.4 01/18/2017   HGB 13.2 01/18/2017   HCT 39.2 01/18/2017   MCV 84.6 01/18/2017   PLT 74 (L) 01/18/2017   Recent Labs    09/10/16 0908 09/18/16 0902 10/16/16 1057 12/26/16 1129  NA 133* 137 133* 137  K 3.3* 3.1* 3.3* 3.3*  CL 99* 98* 97* 100*  CO2 26 30 26 28   GLUCOSE 138* 105* 127* 118*  BUN 13 16 17 17   CREATININE 0.73 0.75 0.72 0.72  CALCIUM 8.7* 8.5* 9.0 9.1  GFRNONAA >60 >60 >60 >60  GFRAA >60 >60 >60 >60  PROT 7.5 7.0  --  8.4*  ALBUMIN 3.7 3.4*  --  3.7  AST 28 27  --  30  ALT 28 27  --  34  ALKPHOS 60 52  --  67  BILITOT 0.6 0.7  --  0.6   positive ANA Flowcytometry showed 1% clonal B cell population   RADIOGRAPHIC STUDIES: I have personally reviewed the radiological images as listed and agreed with the findings in the report.   ultrasound of the abdomen shoed no spleenmegaly. Fatty liver disease.  01/17/2017 CT chest abdomen pelvis with contrast showed no pathological adenopathy was identified, aortic atherosclerosis, three-vessel coronary artery atherosclerotic calcification. Cholecystectomy, small bladder cystocele due to pelvic floor laxity. Prominent stool throughout the colon favors constipation.   ASSESSMENT & PLAN:  1. Thrombocytopenia ([place])   Patient has refractory thrombocytopenia, responded to steroids however recurrent episodes. Also flow cytometry results showed 1% monoclonal B cells. CT scan results discussed with patient which did not show any adenopathy. She has bone marrow biopsy scheduled next week. Her platelet has improved to  74,000, she is not on any steroids for now. Likely after she recovers from upper respiratory illness, platelet improves. No need for steroids or transfusion at this moment. We'll see what the bone marrow shows. Will see her in 2 weeks with repeat CBC and discussed about bone marrow biopsy results.  Rituximab can be considered in the future if she continued to have refractory thrombocytopenia/ITP.    All questions were answered. The patient knows to call the clinic with any problems questions or concerns. Follow up in 2 week.Earlie Server, MD, PhD Hematology Oncology [facility] Pager- [phone] 01/18/2017

## 2017-01-18 ENCOUNTER — Other Ambulatory Visit: Payer: Self-pay

## 2017-01-18 ENCOUNTER — Encounter: Payer: Self-pay | Admitting: Oncology

## 2017-01-18 ENCOUNTER — Inpatient Hospital Stay: Payer: BLUE CROSS/BLUE SHIELD | Attending: Oncology

## 2017-01-18 ENCOUNTER — Inpatient Hospital Stay (HOSPITAL_BASED_OUTPATIENT_CLINIC_OR_DEPARTMENT_OTHER): Payer: BLUE CROSS/BLUE SHIELD | Admitting: Oncology

## 2017-01-18 VITALS — BP 116/76 | HR 95 | Temp 97.2°F | Resp 12 | Ht 61.5 in | Wt 166.5 lb

## 2017-01-18 DIAGNOSIS — Z886 Allergy status to analgesic agent status: Secondary | ICD-10-CM | POA: Diagnosis not present

## 2017-01-18 DIAGNOSIS — R5383 Other fatigue: Secondary | ICD-10-CM | POA: Insufficient documentation

## 2017-01-18 DIAGNOSIS — D696 Thrombocytopenia, unspecified: Secondary | ICD-10-CM | POA: Insufficient documentation

## 2017-01-18 DIAGNOSIS — Z79899 Other long term (current) drug therapy: Secondary | ICD-10-CM | POA: Insufficient documentation

## 2017-01-18 DIAGNOSIS — D693 Immune thrombocytopenic purpura: Secondary | ICD-10-CM | POA: Diagnosis not present

## 2017-01-18 DIAGNOSIS — I10 Essential (primary) hypertension: Secondary | ICD-10-CM | POA: Insufficient documentation

## 2017-01-18 DIAGNOSIS — Z5112 Encounter for antineoplastic immunotherapy: Secondary | ICD-10-CM | POA: Insufficient documentation

## 2017-01-18 DIAGNOSIS — K760 Fatty (change of) liver, not elsewhere classified: Secondary | ICD-10-CM | POA: Diagnosis not present

## 2017-01-18 DIAGNOSIS — Z880 Allergy status to penicillin: Secondary | ICD-10-CM

## 2017-01-18 LAB — CBC WITH DIFFERENTIAL/PLATELET
Basophils Absolute: 0 10*3/uL (ref 0–0.1)
Basophils Relative: 0 %
EOS PCT: 7 %
Eosinophils Absolute: 0.5 10*3/uL (ref 0–0.7)
HCT: 39.2 % (ref 35.0–47.0)
Hemoglobin: 13.2 g/dL (ref 12.0–16.0)
LYMPHS ABS: 2.5 10*3/uL (ref 1.0–3.6)
LYMPHS PCT: 34 %
MCH: 28.5 pg (ref 26.0–34.0)
MCHC: 33.7 g/dL (ref 32.0–36.0)
MCV: 84.6 fL (ref 80.0–100.0)
Monocytes Absolute: 0.6 10*3/uL (ref 0.2–0.9)
Monocytes Relative: 9 %
Neutro Abs: 3.7 10*3/uL (ref 1.4–6.5)
Neutrophils Relative %: 50 %
PLATELETS: 74 10*3/uL — AB (ref 150–440)
RBC: 4.64 MIL/uL (ref 3.80–5.20)
RDW: 13.7 % (ref 11.5–14.5)
WBC: 7.4 10*3/uL (ref 3.6–11.0)

## 2017-01-18 NOTE — Progress Notes (Signed)
Patient here for follow up no changes since last apt. 

## 2017-01-22 ENCOUNTER — Other Ambulatory Visit: Payer: Self-pay | Admitting: General Surgery

## 2017-01-22 ENCOUNTER — Other Ambulatory Visit: Payer: Self-pay | Admitting: Family Medicine

## 2017-01-23 ENCOUNTER — Ambulatory Visit
Admission: RE | Admit: 2017-01-23 | Discharge: 2017-01-23 | Disposition: A | Discharge status: Home / Self Care | Payer: BLUE CROSS/BLUE SHIELD | Source: Ambulatory Visit | Attending: Oncology | Admitting: Oncology

## 2017-01-23 ENCOUNTER — Other Ambulatory Visit (HOSPITAL_COMMUNITY)
Admission: RE | Admit: 2017-01-23 | Disposition: A | Discharge status: Home / Self Care | Payer: BLUE CROSS/BLUE SHIELD | Source: Ambulatory Visit | Attending: Oncology | Admitting: Oncology

## 2017-01-23 DIAGNOSIS — Z888 Allergy status to other drugs, medicaments and biological substances status: Secondary | ICD-10-CM | POA: Insufficient documentation

## 2017-01-23 DIAGNOSIS — Z8249 Family history of ischemic heart disease and other diseases of the circulatory system: Secondary | ICD-10-CM | POA: Diagnosis not present

## 2017-01-23 DIAGNOSIS — D693 Immune thrombocytopenic purpura: Secondary | ICD-10-CM | POA: Insufficient documentation

## 2017-01-23 DIAGNOSIS — D696 Thrombocytopenia, unspecified: Secondary | ICD-10-CM

## 2017-01-23 DIAGNOSIS — Z79899 Other long term (current) drug therapy: Secondary | ICD-10-CM | POA: Insufficient documentation

## 2017-01-23 DIAGNOSIS — Z9889 Other specified postprocedural states: Secondary | ICD-10-CM | POA: Insufficient documentation

## 2017-01-23 DIAGNOSIS — Z9049 Acquired absence of other specified parts of digestive tract: Secondary | ICD-10-CM | POA: Insufficient documentation

## 2017-01-23 DIAGNOSIS — Z880 Allergy status to penicillin: Secondary | ICD-10-CM | POA: Insufficient documentation

## 2017-01-23 DIAGNOSIS — N8110 Cystocele, unspecified: Secondary | ICD-10-CM | POA: Insufficient documentation

## 2017-01-23 DIAGNOSIS — I700 Atherosclerosis of aorta: Secondary | ICD-10-CM | POA: Diagnosis not present

## 2017-01-23 DIAGNOSIS — Z791 Long term (current) use of non-steroidal anti-inflammatories (NSAID): Secondary | ICD-10-CM | POA: Insufficient documentation

## 2017-01-23 DIAGNOSIS — I10 Essential (primary) hypertension: Secondary | ICD-10-CM | POA: Insufficient documentation

## 2017-01-23 DIAGNOSIS — D7589 Other specified diseases of blood and blood-forming organs: Secondary | ICD-10-CM | POA: Diagnosis not present

## 2017-01-23 DIAGNOSIS — K5900 Constipation, unspecified: Secondary | ICD-10-CM | POA: Diagnosis not present

## 2017-01-23 DIAGNOSIS — F329 Major depressive disorder, single episode, unspecified: Secondary | ICD-10-CM | POA: Insufficient documentation

## 2017-01-23 HISTORY — DX: Major depressive disorder, single episode, unspecified: F32.9

## 2017-01-23 HISTORY — DX: Depression, unspecified: F32.A

## 2017-01-23 LAB — CBC WITH DIFFERENTIAL/PLATELET
BASOS ABS: 0 10*3/uL (ref 0–0.1)
BASOS PCT: 1 %
Eosinophils Absolute: 0.6 10*3/uL (ref 0–0.7)
Eosinophils Relative: 9 %
HEMATOCRIT: 41.1 % (ref 35.0–47.0)
HEMOGLOBIN: 13.8 g/dL (ref 12.0–16.0)
Lymphocytes Relative: 40 %
Lymphs Abs: 2.5 10*3/uL (ref 1.0–3.6)
MCH: 28.5 pg (ref 26.0–34.0)
MCHC: 33.6 g/dL (ref 32.0–36.0)
MCV: 84.7 fL (ref 80.0–100.0)
Monocytes Absolute: 0.6 10*3/uL (ref 0.2–0.9)
Monocytes Relative: 9 %
NEUTROS ABS: 2.6 10*3/uL (ref 1.4–6.5)
Neutrophils Relative %: 41 %
Platelets: 106 10*3/uL — ABNORMAL LOW (ref 150–440)
RBC: 4.85 MIL/uL (ref 3.80–5.20)
RDW: 14 % (ref 11.5–14.5)
WBC: 6.3 10*3/uL (ref 3.6–11.0)

## 2017-01-23 LAB — PROTIME-INR
INR: 0.94
PROTHROMBIN TIME: 12.5 s (ref 11.4–15.2)

## 2017-01-23 MED ORDER — LIDOCAINE HCL (PF) 1 % IJ SOLN
INTRAMUSCULAR | Status: AC | PRN
  Administered 2017-01-23: 9 mL

## 2017-01-23 MED ORDER — FENTANYL CITRATE (PF) 100 MCG/2ML IJ SOLN
INTRAMUSCULAR | Status: AC | PRN
  Administered 2017-01-23: 50 ug via INTRAVENOUS
  Administered 2017-01-23: 25 ug via INTRAVENOUS

## 2017-01-23 MED ORDER — SODIUM CHLORIDE 0.9 % IV SOLN
INTRAVENOUS | Status: DC
  Administered 2017-01-23: 08:00:00 via INTRAVENOUS

## 2017-01-23 MED ORDER — MIDAZOLAM HCL 5 MG/5ML IJ SOLN
INTRAMUSCULAR | Status: AC | PRN
  Administered 2017-01-23 (×2): 1 mg via INTRAVENOUS

## 2017-01-23 MED ORDER — FENTANYL CITRATE (PF) 100 MCG/2ML IJ SOLN
INTRAMUSCULAR | Status: AC
  Filled 2017-01-23: qty 4

## 2017-01-23 MED ORDER — HEPARIN SOD (PORK) LOCK FLUSH 100 UNIT/ML IV SOLN
INTRAVENOUS | Status: AC
  Filled 2017-01-23: qty 5

## 2017-01-23 MED ORDER — MIDAZOLAM HCL 5 MG/5ML IJ SOLN
INTRAMUSCULAR | Status: AC
  Filled 2017-01-23: qty 5

## 2017-01-23 NOTE — Progress Notes (Signed)
Patient remains clinically stable post procedure,denies complaints at this time. Sinus rhythm per monitor,husband at bedside. Vitals stable. Dressing to biopsy site dry/intact. Discharge instructions given earlier per Leonidas RombergJuanita Deitz RN with questions answered.

## 2017-01-23 NOTE — H&P (Signed)
Chief Complaint: Patient was seen in consultation today for bone marrow biopsy at the request of Yu,Zhou  Referring Physician(s): Yu,Zhou  Patient Status: [facility] - Out-pt  History of Present Illness: Julia Garner is a 67 y.o. female with a history of thrombocytopenia and ITP. She has had some refractory thrombocytopenia and requires bone marrow biopsy for further work up.  Past Medical History:  Diagnosis Date  . Allergy   . Chronic ITP (idiopathic thrombocytopenia) (HCC)   . Depression   . Head injuries   . History of seizure 2007   one seizure   . Hypertension     Past Surgical History:  Procedure Laterality Date  . CESAREAN SECTION  1990  . CHOLECYSTECTOMY  2005  . CYST EXCISION  1996   Tracheal Cyst    Allergies: Penicillins; Simcor  [niacin-simvastatin er]; and Tylenol  [acetaminophen]  Medications: Prior to Admission medications   Medication Sig Start Date End Date Taking? Authorizing Provider  Bioflavonoid Products (VITAMIN C PLUS) 500 MG TABS Take 2 tablets by mouth daily.    Yes [provider]  Calcium Carbonate-Vit D-Min (CALCIUM 1200 PO) Take by mouth.   Yes [provider]  irbesartan-hydrochlorothiazide (AVALIDE) 150-12.5 MG tablet TAKE 1 TABLET EVERY DAY 06/27/16  Yes Birdie Sons, MD  LORATADINE-D 24HR 10-240 MG 24 hr tablet TAKE 1 TABLET BY MOUTH DAILY AS NEEDED FOR ALLERGIES 08/20/16  Yes Birdie Sons, MD  Naproxen Sodium (ALEVE) 220 MG CAPS Take by mouth daily as needed.   Yes [provider]  Omega 3 1200 MG CAPS Take 2 capsules by mouth daily. 11/17/10  Yes [provider]  sertraline (ZOLOFT) 50 MG tablet TAKE 1 AND 1/2 TABLETS BY MOUTH EVERY MORNING 01/22/17  Yes Birdie Sons, MD  simvastatin (ZOCOR) 40 MG tablet TAKE 1 TABLET BY MOUTH AT BEDTIME 01/04/17  Yes Birdie Sons, MD  traZODone (DESYREL) 50 MG tablet TAKE ONE OR TWO TABLETS AT BEDTIME 01/04/17  Yes Birdie Sons, MD  albuterol  (PROVENTIL HFA;VENTOLIN HFA) 108 (90 Base) MCG/ACT inhaler Inhale 2 puffs into the lungs every 6 (six) hours as needed for wheezing or shortness of breath. Patient not taking: Reported on 01/23/2017 10/25/16   Virginia Crews, MD  budesonide-formoterol [facility]) 80-4.5 MCG/ACT inhaler Inhale 2 puffs into the lungs 2 (two) times daily.    [provider]     Family History  Problem Relation Age of Onset  . Heart attack Father        1st MI at age 57    Social History   Socioeconomic History  . Marital status: Married    Spouse name: None  . Number of children: 3  . Years of education: None  . Highest education level: None  Social Needs  . Financial resource strain: None  . Food insecurity - worry: None  . Food insecurity - inability: None  . Transportation needs - medical: None  . Transportation needs - non-medical: None  Occupational History  . Occupation: retired    Comment: Community education officer  Tobacco Use  . Smoking status: Never Smoker  . Smokeless tobacco: Never Used  Substance and Sexual Activity  . Alcohol use: No    Alcohol/week: 0.0 oz  . Drug use: No  . Sexual activity: None  Other Topics Concern  . None  Social History Narrative  . None     Review of Systems: A 12 point ROS discussed and pertinent positives are indicated in  the HPI above.  All other systems are negative.  Review of Systems  Constitutional: Negative.   HENT: Negative.   Respiratory: Negative.   Cardiovascular: Negative.   Gastrointestinal: Negative.   Genitourinary: Negative.   Musculoskeletal: Negative.   Neurological: Negative.   Hematological: Negative for adenopathy. Bruises/bleeds easily.    Vital Signs: BP (!) 105/93   Pulse 94   Temp 98 F (36.7 C) (Oral)   Resp 13   SpO2 99%   Physical Exam  Constitutional: She is oriented to person, place, and time. She appears well-developed and well-nourished. No distress.  HENT:  Head: Normocephalic and atraumatic.   Neck: Neck supple. No JVD present.  Cardiovascular: Normal rate, regular rhythm and normal heart sounds. Exam reveals no gallop and no friction rub.  No murmur heard. Pulmonary/Chest: Effort normal and breath sounds normal. No stridor. No respiratory distress. She has no wheezes. She has no rales.  Abdominal: Soft. Bowel sounds are normal. She exhibits no distension and no mass. There is no tenderness. There is no rebound and no guarding.  Musculoskeletal: She exhibits no edema.  Lymphadenopathy:    She has no cervical adenopathy.  Neurological: She is alert and oriented to person, place, and time.  Skin: Skin is warm and dry. No rash noted. She is not diaphoretic.  Vitals reviewed.   Imaging: Ct Chest W Contrast  Result Date: 01/18/2017 CLINICAL DATA:  Thrombocytopenia.  Fever and malaise. EXAM: CT CHEST, ABDOMEN, AND PELVIS WITH CONTRAST TECHNIQUE: Multidetector CT imaging of the chest, abdomen and pelvis was performed following the standard protocol during bolus administration of intravenous contrast. CONTRAST:  159m ISOVUE-300 IOPAMIDOL (ISOVUE-300) INJECTION 61% COMPARISON:  Multiple exams, including 10/25/2016 FINDINGS: CT CHEST FINDINGS Cardiovascular: Notable three-vessel coronary artery atherosclerotic calcification. Atherosclerotic calcification in the aortic arch. Mediastinum/Nodes: Scattered small axillary and mediastinal lymph nodes without overt pathologic enlargement. Lungs/Pleura: Mild biapical pleuroparenchymal scarring. Musculoskeletal: Mild thoracic spondylosis. Suspected partial interbody fusion at C6-7. CT ABDOMEN PELVIS FINDINGS Hepatobiliary: Cholecystectomy. Common bile duct measures up to 8 mm in diameter. No intrahepatic biliary dilatation. Pancreas: Unremarkable Spleen: Unremarkable Adrenals/Urinary Tract: Small cystocele due to pelvic floor laxity. The kidneys and adrenal glands appear normal. Stomach/Bowel: Prominent stool throughout the colon favors constipation.  Vascular/Lymphatic: Aortoiliac atherosclerotic vascular disease. Reproductive: Unremarkable Other: No supplemental non-categorized findings. Musculoskeletal: Disc bulge at the L4-5 level. IMPRESSION: 1. No pathologic adenopathy is identified. 2. Aortic Atherosclerosis (ICD10-I70.0). Three-vessel coronary artery atherosclerotic calcification. 3. Cholecystectomy. Mild extrahepatic biliary prominence may be a physiologic response to cholecystectomy. 4. Small bladder cystocele due to pelvic floor laxity. 5.  Prominent stool throughout the colon favors constipation. Electronically Signed   By: WVan ClinesM.D.   On: 01/18/2017 08:30   Ct Abdomen Pelvis W Contrast  Result Date: 01/18/2017 CLINICAL DATA:  Thrombocytopenia.  Fever and malaise. EXAM: CT CHEST, ABDOMEN, AND PELVIS WITH CONTRAST TECHNIQUE: Multidetector CT imaging of the chest, abdomen and pelvis was performed following the standard protocol during bolus administration of intravenous contrast. CONTRAST:  1031mISOVUE-300 IOPAMIDOL (ISOVUE-300) INJECTION 61% COMPARISON:  Multiple exams, including 10/25/2016 FINDINGS: CT CHEST FINDINGS Cardiovascular: Notable three-vessel coronary artery atherosclerotic calcification. Atherosclerotic calcification in the aortic arch. Mediastinum/Nodes: Scattered small axillary and mediastinal lymph nodes without overt pathologic enlargement. Lungs/Pleura: Mild biapical pleuroparenchymal scarring. Musculoskeletal: Mild thoracic spondylosis. Suspected partial interbody fusion at C6-7. CT ABDOMEN PELVIS FINDINGS Hepatobiliary: Cholecystectomy. Common bile duct measures up to 8 mm in diameter. No intrahepatic biliary dilatation. Pancreas: Unremarkable Spleen: Unremarkable Adrenals/Urinary Tract: Small cystocele due  to pelvic floor laxity. The kidneys and adrenal glands appear normal. Stomach/Bowel: Prominent stool throughout the colon favors constipation. Vascular/Lymphatic: Aortoiliac atherosclerotic vascular disease.  Reproductive: Unremarkable Other: No supplemental non-categorized findings. Musculoskeletal: Disc bulge at the L4-5 level. IMPRESSION: 1. No pathologic adenopathy is identified. 2. Aortic Atherosclerosis (ICD10-I70.0). Three-vessel coronary artery atherosclerotic calcification. 3. Cholecystectomy. Mild extrahepatic biliary prominence may be a physiologic response to cholecystectomy. 4. Small bladder cystocele due to pelvic floor laxity. 5.  Prominent stool throughout the colon favors constipation. Electronically Signed   By: Van Clines M.D.   On: 01/18/2017 08:30    Labs:  CBC: Recent Labs    01/02/17 1348 01/09/17 1150 01/18/17 1422 01/23/17 0725  WBC 13.0* 7.4 7.4 6.3  HGB 14.9 13.0 13.2 13.8  HCT 44.3 38.7 39.2 41.1  PLT 286 28* 74* 106*    COAGS: Recent Labs    01/23/17 0725  INR 0.94    BMP: Recent Labs    09/10/16 0908 09/18/16 0902 10/16/16 1057 12/26/16 1129  NA 133* 137 133* 137  K 3.3* 3.1* 3.3* 3.3*  CL 99* 98* 97* 100*  CO2 26 30 26 28   GLUCOSE 138* 105* 127* 118*  BUN 13 16 17 17   CALCIUM 8.7* 8.5* 9.0 9.1  CREATININE 0.73 0.75 0.72 0.72  GFRNONAA >60 >60 >60 >60  GFRAA >60 >60 >60 >60    LIVER FUNCTION TESTS: Recent Labs    09/10/16 0908 09/18/16 0902 12/26/16 1129  BILITOT 0.6 0.7 0.6  AST 28 27 30   ALT 28 27 34  ALKPHOS 60 52 67  PROT 7.5 7.0 8.4*  ALBUMIN 3.7 3.4* 3.7     Assessment and Plan:  For CT guided bone marrow aspiration and biopsy today. Risks and benefits discussed with the patient including, but not limited to bleeding, infection, damage to adjacent structures or low yield requiring additional tests. All of the patient's questions were answered, patient is agreeable to proceed. Consent signed and in chart.  Thank you for this interesting consult.  I greatly enjoyed meeting Kenedi Cilia and look forward to participating in their care.  A copy of this report was sent to the requesting provider on this  date.  Electronically Signed: Azzie Roup, MD 01/23/2017, 9:00 AM     I spent a total of 30 Minutes  in face to face in clinical consultation, greater than 50% of which was counseling/coordinating care for bone marrow biopsy.

## 2017-01-23 NOTE — Procedures (Signed)
Interventional Radiology Procedure Note  Procedure: CT guided bone marrow aspiration and biopsy  Complications: None  EBL: < 10 mL  Findings: Aspirate and core biopsy performed of bone marrow in right iliac bone.  Plan: Bedrest supine x 1 hrs  Mattias Walmsley T. Tawona Filsinger, M.D Pager:  [phone]   

## 2017-01-24 ENCOUNTER — Other Ambulatory Visit: Payer: Self-pay | Admitting: Oncology

## 2017-01-31 NOTE — Progress Notes (Signed)
Hematology/Oncology Follow up visit [facility] Telephone:([phone] [phone] Patient Care Team: Malva LimesFisher, Donald E, MD as PCP - General (Family Medicine)  REASON FOR VISIT Follow up for treatment of thrombocytopenia  PERTINENT HEMATOLOGY HISTORY Julia Garner 67 y.o.  female with past medical history listed as below was referred by primary care provider Dr. Sherrie MustacheFisher to me for evaluation of thrombocytopenia. Patient had regular check up with primary care provider and had labs done on 08/22/2016. Platelet counts were slow at 15,000. Patient has a history of chronic ITP. Her last ITP attack prior than this one was 7 or 8 months ago per patient. Patient was called back, and started on a course of prednisone tapering. She have another CBC done on 08/27/2016. Platelet count recovers to 120,000. Patient reports that she was seen by Dr. Sherrlyn Hockpandit many many years ago but she stopped follow-up. She has a Garner ITP attacks per year. And when she noticed more bruising, she will call to primary care provider Dr. Sherrie MustacheFisher and got prednisone treatment. Denies any prior transfusion, alcohol use. She has another episode of ITP which was treated by PCP with a short course of prednisone. Platelet improved but decreased again. She was restarted on prednisone and platelet counts improved.   INTERVAL HISTORY Julia Garner is a 67 y.o. female who has above hematology history reviewed and edited by me today presents for follow up visit for management of ITP. During the interval and had bone marrow biopsythe patient presents to discuss about results. He denies any new symptoms. Denies any bleeding events.   ROS:  Review of Systems  Constitutional: Negative for appetite change, chills and fatigue.  HENT:   Negative for hearing loss, lump/mass and nosebleeds.   Eyes: Negative for eye problems and icterus.  Respiratory: Negative for chest tightness and hemoptysis.   Cardiovascular:  Negative for chest pain and palpitations.  Gastrointestinal: Negative for abdominal distention and blood in stool.  Genitourinary: Negative for bladder incontinence, difficulty urinating and frequency.   Musculoskeletal: Negative for arthralgias and back pain.  Skin: Negative for itching.  Neurological: Negative for dizziness and headaches.  Hematological: Negative for adenopathy. Does not bruise/bleed easily.  Psychiatric/Behavioral: Negative for decreased concentration. The patient is not nervous/anxious.     MEDICAL HISTORY:  Past Medical History:  Diagnosis Date  . Allergy   . Chronic ITP (idiopathic thrombocytopenia) (HCC)   . Depression   . Head injuries   . History of seizure 2007   one seizure   . Hypertension     SURGICAL HISTORY: Past Surgical History:  Procedure Laterality Date  . CESAREAN SECTION  1990  . CHOLECYSTECTOMY  2005  . CYST EXCISION  1996   Tracheal Cyst    SOCIAL HISTORY: Social History   Socioeconomic History  . Marital status: Married    Spouse name: Not on file  . Number of children: 3  . Years of education: Not on file  . Highest education level: Not on file  Social Needs  . Financial resource strain: Not on file  . Food insecurity - worry: Not on file  . Food insecurity - inability: Not on file  . Transportation needs - medical: Not on file  . Transportation needs - non-medical: Not on file  Occupational History  . Occupation: retired    Comment: Adult nursehysical Therapist  Tobacco Use  . Smoking status: Never Smoker  . Smokeless tobacco: Never Used  Substance and Sexual Activity  . Alcohol use: No  Alcohol/week: 0.0 oz  . Drug use: No  . Sexual activity: Not on file  Other Topics Concern  . Not on file  Social History Narrative  . Not on file    FAMILY HISTORY: Family History  Problem Relation Age of Onset  . Heart attack Father        1st MI at age 52    ALLERGIES:  is allergic to penicillins; simcor  [niacin-simvastatin  er]; and tylenol  [acetaminophen].  MEDICATIONS:  Current Outpatient Medications  Medication Sig Dispense Refill  . albuterol (PROVENTIL HFA;VENTOLIN HFA) 108 (90 Base) MCG/ACT inhaler Inhale 2 puffs into the lungs every 6 (six) hours as needed for wheezing or shortness of breath. 1 Inhaler 0  . Bioflavonoid Products (VITAMIN C PLUS) 500 MG TABS Take 2 tablets by mouth daily.     . budesonide-formoterol (SYMBICORT) 80-4.5 MCG/ACT inhaler Inhale 2 puffs into the lungs 2 (two) times daily.    . Calcium Carbonate-Vit D-Min (CALCIUM 1200 PO) Take by mouth.    . irbesartan-hydrochlorothiazide (AVALIDE) 150-12.5 MG tablet TAKE 1 TABLET EVERY DAY 90 tablet 4  . LORATADINE-D 24HR 10-240 MG 24 hr tablet TAKE 1 TABLET BY MOUTH DAILY AS NEEDED FOR ALLERGIES 30 tablet 12  . Naproxen Sodium (ALEVE) 220 MG CAPS Take by mouth daily as needed.    . Omega 3 1200 MG CAPS Take 2 capsules by mouth daily.    . sertraline (ZOLOFT) 50 MG tablet TAKE 1 AND 1/2 TABLETS BY MOUTH EVERY MORNING 135 tablet 4  . simvastatin (ZOCOR) 40 MG tablet TAKE 1 TABLET BY MOUTH AT BEDTIME 90 tablet 4  . traZODone (DESYREL) 50 MG tablet TAKE ONE OR TWO TABLETS AT BEDTIME 180 tablet 4   No current facility-administered medications for this visit.       Marland Kitchen  PHYSICAL EXAMINATION: ECOG PERFORMANCE STATUS: 1 - Symptomatic but completely ambulatory Vitals:   02/01/17 1508 02/01/17 1514  BP:  116/82  Pulse:  (!) 101  Resp: 16   Temp:  98.4 F (36.9 C)   Filed Weights   02/01/17 1508  Weight: 167 lb 3.2 oz (75.8 kg)  GENERAL: No distress, well nourished.  SKIN:  No rashes or significant lesions  HEAD: Normocephalic, No masses, lesions, tenderness or abnormalities  EYES: Conjunctiva are pink, non icteric ENT: External ears normal ,lips , buccal mucosa, and tongue normal and mucous membranes are moist  LYMPH: No palpable cervical and axillary lymphadenopathy  LUNGS: Clear to auscultation, no crackles or wheezes HEART: Regular  rate & rhythm, no murmurs, no gallops, S1 normal and S2 normal  ABDOMEN: Abdomen soft, non-tender, normal bowel sounds, I did not appreciate any  masses or organomegaly  MUSCULOSKELETAL: No CVA tenderness and no tenderness on percussion of the back or rib cage.  EXTREMITIES: No edema, no skin discoloration or tenderness NEURO: Alert & oriented, no focal motor/sensory deficits.         LABORATORY DATA:  I have reviewed the data as listed Lab Results  Component Value Date   WBC 6.3 01/23/2017   HGB 13.8 01/23/2017   HCT 41.1 01/23/2017   MCV 84.7 01/23/2017   PLT 106 (L) 01/23/2017   Recent Labs    09/10/16 0908 09/18/16 0902 10/16/16 1057 12/26/16 1129  NA 133* 137 133* 137  K 3.3* 3.1* 3.3* 3.3*  CL 99* 98* 97* 100*  CO2 26 30 26 28   GLUCOSE 138* 105* 127* 118*  BUN 13 16 17 17   CREATININE 0.73 0.75  0.72 0.72  CALCIUM 8.7* 8.5* 9.0 9.1  GFRNONAA >60 >60 >60 >60  GFRAA >60 >60 >60 >60  PROT 7.5 7.0  --  8.4*  ALBUMIN 3.7 3.4*  --  3.7  AST 28 27  --  30  ALT 28 27  --  34  ALKPHOS 60 52  --  67  BILITOT 0.6 0.7  --  0.6   positive ANA Flowcytometry showed 1% clonal B cell population   RADIOGRAPHIC STUDIES: I have personally reviewed the radiological images as listed and agreed with the findings in the report.   ultrasound of the abdomen shoed no spleenmegaly. Fatty liver disease.  01/17/2017 CT chest abdomen pelvis with contrast showed no pathological adenopathy was identified, aortic atherosclerosis, three-vessel coronary artery atherosclerotic calcification. Cholecystectomy, small bladder cystocele due to pelvic floor laxity. Prominent stool throughout the colon favors constipation.  Bone marrow evaluation results # Bone Marrow Flow Cytometry - NO MONOCLONAL B-CELL POPULATION OR ABNORMAL T-CELL PHENOTYPE IDENTIFIED  #Bone Marrow, Aspirate,Biopsy, and Clot, right iliac - HYPERCELLULAR BONE MARROW WITH TRILINEAGE HEMATOPOIESIS INCLUDING INCREASED NUMBER  OF MEGAKARYOCYTES. The bone marrow is slightly hypercellular for age with trilineage hematopoiesis including increased number of megakaryocytes, most of which display normal morphology. In the presence of thrombocytopenia, the findings support peripheral consumption, destruction or sequestration of platelets. There is no evidence of a lymphoproliferative process in this material. Correlation with cytogenetic studies is recommended.  ASSESSMENT & PLAN:  1. Thrombocytopenia (HCC)   Patient has refractory thrombocytopenia, responded to steroids however recurrent episodes. Patient has had extensive workup so far. She does not have any splenomegaly or lymphadenopathy on CAT scan. Peripheral blood flowcytometry showed 1% monoclonal B cells.bone marrow evaluation revealed hypercellular marrow with trilineage hematopoiesis including increased number of megakaryocytes. This is consistent with peripheral platelet consumption, destruction or sequestration. No evidence of lymphoproliferative process of bone marrow.  Discussed with patient that given that her refractory thrombocytopenia responding to steroid however recurrent episodes with steroids being tapered off, I would recommend being treated with weekly rituximab for 4 weeks. Other options including splenomegaly, IVIG discussed with patient. The peripheral blood flow cytometry 1% monoclonal cells can reflect a very low grade lymphoma which has no detectable lymphadenopathy on CT scan and does not have bone marrow involvement. If that's the case rituximab most likely helpful in treating this condition as well. Side effects of rituximab including severe reactions progressive multifocal leukoencephalopathy, lymphocytopenia and neutropenia etc. Discussed with patient patient is in agreement. She would like to recall her son-in-law Dr.Kamron Tami Ribas was a nephrologist. I have called in and discussed with him with above information. Dr.Kamron agrees with the plan  and he is going to relay information to patient. Patient has been tested negative for hepatitis surface antigen.  All questions were answered. The patient knows to call the clinic with any problems questions or concerns. Once her rituximab is approved by insurance, she can proceed was first brought to the treatment. I will see her prior to her second rituximab treatment..  Total face to face encounter time for this patient visit was 40 min. >50% of the time was  spent in counseling and coordination of care.   Rickard Patience, MD, PhD Hematology Oncology [facility] Pager- [phone] 02/01/2017

## 2017-02-01 ENCOUNTER — Other Ambulatory Visit: Payer: Self-pay

## 2017-02-01 ENCOUNTER — Inpatient Hospital Stay: Payer: BLUE CROSS/BLUE SHIELD

## 2017-02-01 ENCOUNTER — Encounter: Payer: Self-pay | Admitting: Oncology

## 2017-02-01 ENCOUNTER — Inpatient Hospital Stay (HOSPITAL_BASED_OUTPATIENT_CLINIC_OR_DEPARTMENT_OTHER): Payer: BLUE CROSS/BLUE SHIELD | Admitting: Oncology

## 2017-02-01 VITALS — BP 116/82 | HR 101 | Temp 98.4°F | Resp 16 | Ht 61.5 in | Wt 167.2 lb

## 2017-02-01 DIAGNOSIS — D693 Immune thrombocytopenic purpura: Secondary | ICD-10-CM

## 2017-02-01 DIAGNOSIS — I10 Essential (primary) hypertension: Secondary | ICD-10-CM

## 2017-02-01 DIAGNOSIS — D696 Thrombocytopenia, unspecified: Secondary | ICD-10-CM

## 2017-02-01 DIAGNOSIS — Z880 Allergy status to penicillin: Secondary | ICD-10-CM

## 2017-02-01 DIAGNOSIS — Z79899 Other long term (current) drug therapy: Secondary | ICD-10-CM | POA: Diagnosis not present

## 2017-02-01 DIAGNOSIS — R5383 Other fatigue: Secondary | ICD-10-CM

## 2017-02-01 DIAGNOSIS — K760 Fatty (change of) liver, not elsewhere classified: Secondary | ICD-10-CM

## 2017-02-01 LAB — CBC WITH DIFFERENTIAL/PLATELET
Basophils Absolute: 0 10*3/uL (ref 0–0.1)
Basophils Relative: 1 %
Eosinophils Absolute: 0.3 10*3/uL (ref 0–0.7)
Eosinophils Relative: 4 %
HCT: 40 % (ref 35.0–47.0)
Hemoglobin: 13.4 g/dL (ref 12.0–16.0)
LYMPHS PCT: 39 %
Lymphs Abs: 3.1 10*3/uL (ref 1.0–3.6)
MCH: 28.3 pg (ref 26.0–34.0)
MCHC: 33.5 g/dL (ref 32.0–36.0)
MCV: 84.7 fL (ref 80.0–100.0)
MONO ABS: 0.7 10*3/uL (ref 0.2–0.9)
MONOS PCT: 9 %
NEUTROS ABS: 3.8 10*3/uL (ref 1.4–6.5)
Neutrophils Relative %: 47 %
Platelets: 55 10*3/uL — ABNORMAL LOW (ref 150–440)
RBC: 4.73 MIL/uL (ref 3.80–5.20)
RDW: 14.1 % (ref 11.5–14.5)
WBC: 7.9 10*3/uL (ref 3.6–11.0)

## 2017-02-01 NOTE — Progress Notes (Signed)
Patient here for results. She has had her bone marrow biopsy.

## 2017-02-01 NOTE — Progress Notes (Signed)
START OFF PATHWAY REGIMEN - Elnita Maxwell[Other Dx]   [id number]:Rituximab (Weekly):   Administer weekly:     Rituximab   **Always confirm dose/schedule in your pharmacy ordering system**    Administration Notes: Rituximab for ITP treatment.  Patient Characteristics: Intent of Therapy: Curative Intent, Discussed with Patient

## 2017-02-04 ENCOUNTER — Ambulatory Visit: Payer: BLUE CROSS/BLUE SHIELD

## 2017-02-05 ENCOUNTER — Inpatient Hospital Stay: Payer: BLUE CROSS/BLUE SHIELD

## 2017-02-05 VITALS — BP 119/72 | HR 110 | Resp 20

## 2017-02-05 DIAGNOSIS — D696 Thrombocytopenia, unspecified: Secondary | ICD-10-CM | POA: Diagnosis not present

## 2017-02-05 DIAGNOSIS — D693 Immune thrombocytopenic purpura: Secondary | ICD-10-CM

## 2017-02-05 MED ORDER — SODIUM CHLORIDE 0.9 % IV SOLN
Freq: Once | INTRAVENOUS | Status: AC
  Administered 2017-02-05: 09:00:00 via INTRAVENOUS
  Filled 2017-02-05: qty 1000

## 2017-02-05 MED ORDER — DIPHENHYDRAMINE HCL 25 MG PO CAPS
50.0000 mg | ORAL_CAPSULE | Freq: Once | ORAL | Status: AC
  Administered 2017-02-05: 50 mg via ORAL
  Filled 2017-02-05: qty 2

## 2017-02-05 MED ORDER — SODIUM CHLORIDE 0.9 % IV SOLN
375.0000 mg/m2 | Freq: Once | INTRAVENOUS | Status: AC
  Administered 2017-02-05: 700 mg via INTRAVENOUS
  Filled 2017-02-05: qty 20

## 2017-02-05 MED ORDER — DEXAMETHASONE SODIUM PHOSPHATE 10 MG/ML IJ SOLN
10.0000 mg | Freq: Once | INTRAMUSCULAR | Status: AC
  Administered 2017-02-05: 10 mg via INTRAVENOUS
  Filled 2017-02-05: qty 1

## 2017-02-05 MED ORDER — SODIUM CHLORIDE 0.9 % IV SOLN: 10.0000 mg | Freq: Once | INTRAVENOUS | Status: DC

## 2017-02-06 ENCOUNTER — Other Ambulatory Visit: Payer: BLUE CROSS/BLUE SHIELD

## 2017-02-11 ENCOUNTER — Encounter (HOSPITAL_COMMUNITY): Payer: Self-pay

## 2017-02-12 ENCOUNTER — Other Ambulatory Visit: Payer: Self-pay

## 2017-02-12 ENCOUNTER — Inpatient Hospital Stay: Payer: BLUE CROSS/BLUE SHIELD

## 2017-02-12 ENCOUNTER — Inpatient Hospital Stay (HOSPITAL_BASED_OUTPATIENT_CLINIC_OR_DEPARTMENT_OTHER): Payer: BLUE CROSS/BLUE SHIELD | Admitting: Oncology

## 2017-02-12 ENCOUNTER — Encounter: Payer: Self-pay | Admitting: Oncology

## 2017-02-12 VITALS — BP 109/75 | HR 102 | Temp 99.1°F | Wt 167.0 lb

## 2017-02-12 DIAGNOSIS — D696 Thrombocytopenia, unspecified: Secondary | ICD-10-CM

## 2017-02-12 DIAGNOSIS — K760 Fatty (change of) liver, not elsewhere classified: Secondary | ICD-10-CM | POA: Diagnosis not present

## 2017-02-12 DIAGNOSIS — Z880 Allergy status to penicillin: Secondary | ICD-10-CM | POA: Diagnosis not present

## 2017-02-12 DIAGNOSIS — I10 Essential (primary) hypertension: Secondary | ICD-10-CM

## 2017-02-12 DIAGNOSIS — D693 Immune thrombocytopenic purpura: Secondary | ICD-10-CM | POA: Diagnosis not present

## 2017-02-12 DIAGNOSIS — Z886 Allergy status to analgesic agent status: Secondary | ICD-10-CM | POA: Diagnosis not present

## 2017-02-12 DIAGNOSIS — Z79899 Other long term (current) drug therapy: Secondary | ICD-10-CM | POA: Diagnosis not present

## 2017-02-12 LAB — CBC WITH DIFFERENTIAL/PLATELET
Basophils Absolute: 0.1 10*3/uL (ref 0–0.1)
Basophils Relative: 1 %
EOS ABS: 0.6 10*3/uL (ref 0–0.7)
Eosinophils Relative: 8 %
HCT: 42 % (ref 35.0–47.0)
HEMOGLOBIN: 14.1 g/dL (ref 12.0–16.0)
LYMPHS ABS: 3 10*3/uL (ref 1.0–3.6)
LYMPHS PCT: 36 %
MCH: 28.6 pg (ref 26.0–34.0)
MCHC: 33.5 g/dL (ref 32.0–36.0)
MCV: 85.2 fL (ref 80.0–100.0)
Monocytes Absolute: 0.7 10*3/uL (ref 0.2–0.9)
Monocytes Relative: 8 %
NEUTROS PCT: 47 %
Neutro Abs: 4 10*3/uL (ref 1.4–6.5)
Platelets: 132 10*3/uL — ABNORMAL LOW (ref 150–440)
RBC: 4.93 MIL/uL (ref 3.80–5.20)
RDW: 14 % (ref 11.5–14.5)
WBC: 8.3 10*3/uL (ref 3.6–11.0)

## 2017-02-12 MED ORDER — SODIUM CHLORIDE 0.9 % IV SOLN
375.0000 mg/m2 | Freq: Once | INTRAVENOUS | Status: AC
  Administered 2017-02-12: 700 mg via INTRAVENOUS
  Filled 2017-02-12: qty 50

## 2017-02-12 MED ORDER — DEXAMETHASONE SODIUM PHOSPHATE 10 MG/ML IJ SOLN
10.0000 mg | Freq: Once | INTRAMUSCULAR | Status: AC
  Administered 2017-02-12: 10 mg via INTRAVENOUS
  Filled 2017-02-12: qty 1

## 2017-02-12 MED ORDER — SODIUM CHLORIDE 0.9 % IV SOLN: 375.0000 mg/m2 | Freq: Once | INTRAVENOUS | Status: DC

## 2017-02-12 MED ORDER — DIPHENHYDRAMINE HCL 25 MG PO CAPS
50.0000 mg | ORAL_CAPSULE | Freq: Once | ORAL | Status: AC
  Administered 2017-02-12: 50 mg via ORAL
  Filled 2017-02-12: qty 2

## 2017-02-12 MED ORDER — SODIUM CHLORIDE 0.9 % IV SOLN: 10.0000 mg | Freq: Once | INTRAVENOUS | Status: DC

## 2017-02-12 MED ORDER — SODIUM CHLORIDE 0.9 % IV SOLN
Freq: Once | INTRAVENOUS | Status: AC
  Administered 2017-02-12: 10:00:00 via INTRAVENOUS
  Filled 2017-02-12: qty 1000

## 2017-02-12 NOTE — Progress Notes (Signed)
Hematology/Oncology Follow up visit [facility] Telephone:(336) [phone] Fax:(336) [phone] Patient Care Team: Birdie Sons, MD as PCP - General (Family Medicine)  REASON FOR VISIT Follow up for treatment of thrombocytopenia  PERTINENT HEMATOLOGY HISTORY Julia Garner 67 y.o.  female who has a history of recurrent ITP. Before she was referred to me, she has a long history of ITP which were treated with sterids by primary care physician and she used to respond to steroids. She was seen by anther Hematologist Dr.Pandit many years ago. Dr.Pandit has left [place].  She had labs doneon 08/22/2016. Platelet counts were slow at 15,000. She was treated with a short course (5 days) of Steroids by Dr.Fisher and platelet recovered to 120,000, however later dropped again to 11,000 on 09/10/2016.  I treated her with a longer course of prednisone with slow tapering and her counts again responded to treatment. During the tapering the course,she visited home country [place] and had blood work done there after she finished the tapering course of Prednisone there. She reports the platelet counts were normal in [place].   Patient has had lab work up including negative hepatitis, HIV, normal LDH, normal B12 and folate level. ANA was positive and she was evaluated by rheumatologist. Based on my phone discussion with Rheumatologist, patient has had rheumatology work up and was not considered to have any autoimmune problems. She does not have hepatosplenomegaly.   Patient returned to clinic in December and repeat blood work on 12/26/2016 showed platelet count of 16,000. Treated with a course of Dexamethasone 70m x 4 days, platelet again responded and dropped again.  And she reports easy bruising, feeling tired.  Peripheral blood flowcytometry 1% of analyzed cells containing clonal B cell population which are CD5- and CD 10-.  There is mildly increased eosinophils 7%.   Patient does  not have any palpable lymphadenopathy on physical examination and we obtained CT which showed no pathological lymphadenopathy. Bone marrow biopsy showed hypercellular marrow with trilineage hematopoiesis including increased megakaryocytes. No evidence of lymphoproliferative process involvement. Normal cytogenetics.  At this point patient has had extensive work up for her thrombocytopenia, and we discussed in lengthy that she most likely have ITP, there maybe a low grade lymphoma too. Given that she has recurrent ITP and platelet counts cannot be maintained without steroids, I suggest Rituximab weekly x 4 for consolidation. Patient has requested me to talk to her son in law Dr.Kamron who is a [place]rolog[place] in [place] I have talked to Dr.Kamron about the rationale of using Rituximab and also the side effects profile. Dr.Kamron agrees the plan and has relayed information to patient. I have also discussed the side effects of Rituximab with patient. She agrees with treatment.   INTERVAL HISTORY QTemitope Flammeris a 67y.o. female who has above hematology history reviewed and edited by me today presents for follow up visit for management of ITP.  She tolerates 1 treatment of Rituximab last week. She also got 178mDexamethasone as premedication prior to Rituximab. She does not have any infusion reaction. She reports doing well today without any new complaints.  No easy bruising.  Today's platelet counts are 132,000.   ROS:  Review of Systems  Constitutional: Negative for appetite change, chills and fatigue.  HENT:   Negative for hearing loss, lump/mass and nosebleeds.   Eyes: Negative for eye problems and icterus.  Respiratory: Negative for chest tightness and cough.   Cardiovascular: Negative for chest pain and leg swelling.  Gastrointestinal: Negative for  abdominal distention.  Genitourinary: Negative for difficulty urinating and dysuria.   Musculoskeletal: Negative for gait problem.       Had joint pain  lately.   Skin: Negative for itching.  Neurological: Negative for dizziness, gait problem and headaches.  Hematological: Negative for adenopathy. Does not bruise/bleed easily.  Psychiatric/Behavioral: The patient is not nervous/anxious.     MEDICAL HISTORY:  Past Medical History:  Diagnosis Date  . Allergy   . Chronic ITP (idiopathic thrombocytopenia) (HCC)   . Depression   . Head injuries   . History of seizure 2007   one seizure   . Hypertension     SURGICAL HISTORY: Past Surgical History:  Procedure Laterality Date  . CESAREAN SECTION  1990  . CHOLECYSTECTOMY  2005  . CYST EXCISION  1996   Tracheal Cyst    SOCIAL HISTORY: Social History   Socioeconomic History  . Marital status: Married    Spouse name: Not on file  . Number of children: 3  . Years of education: Not on file  . Highest education level: Not on file  Social Needs  . Financial resource strain: Not on file  . Food insecurity - worry: Not on file  . Food insecurity - inability: Not on file  . Transportation needs - medical: Not on file  . Transportation needs - non-medical: Not on file  Occupational History  . Occupation: retired    Comment: Community education officer  Tobacco Use  . Smoking status: Never Smoker  . Smokeless tobacco: Never Used  Substance and Sexual Activity  . Alcohol use: No    Alcohol/week: 0.0 oz  . Drug use: No  . Sexual activity: Not on file  Other Topics Concern  . Not on file  Social History Narrative  . Not on file    FAMILY HISTORY: Family History  Problem Relation Age of Onset  . Heart attack Father        1st MI at age 37    ALLERGIES:  is allergic to penicillins; simcor  [niacin-simvastatin er]; and tylenol  [acetaminophen].  MEDICATIONS:  Current Outpatient Medications  Medication Sig Dispense Refill  . albuterol (PROVENTIL HFA;VENTOLIN HFA) 108 (90 Base) MCG/ACT inhaler Inhale 2 puffs into the lungs every 6 (six) hours as needed for wheezing or shortness of  breath. 1 Inhaler 0  . Bioflavonoid Products (VITAMIN C PLUS) 500 MG TABS Take 2 tablets by mouth daily.     . Calcium Carbonate-Vit D-Min (CALCIUM 1200 PO) Take by mouth.    . irbesartan-hydrochlorothiazide (AVALIDE) 150-12.5 MG tablet TAKE 1 TABLET EVERY DAY 90 tablet 4  . LORATADINE-D 24HR 10-240 MG 24 hr tablet TAKE 1 TABLET BY MOUTH DAILY AS NEEDED FOR ALLERGIES 30 tablet 12  . Naproxen Sodium (ALEVE) 220 MG CAPS Take by mouth daily as needed.    . Omega 3 1200 MG CAPS Take 2 capsules by mouth daily.    . Pseudoephedrine-Guaifenesin (MUCINEX D PO) Take by mouth.    . sertraline (ZOLOFT) 50 MG tablet TAKE 1 AND 1/2 TABLETS BY MOUTH EVERY MORNING 135 tablet 4  . simvastatin (ZOCOR) 40 MG tablet TAKE 1 TABLET BY MOUTH AT BEDTIME 90 tablet 4  . traZODone (DESYREL) 50 MG tablet TAKE ONE OR TWO TABLETS AT BEDTIME 180 tablet 4   No current facility-administered medications for this visit.       Marland Kitchen  PHYSICAL EXAMINATION: ECOG PERFORMANCE STATUS: 0 - Asymptomatic Vitals:   02/12/17 0919  BP: 109/75  Pulse: (!) 102  Temp: 99.1 F (37.3 C)   Filed Weights   02/12/17 0919  Weight: 167 lb (75.8 kg)  Physical Exam  Constitutional: She is oriented to person, place, and time and well-developed, well-nourished, and in no distress. No distress.  HENT:  Head: Normocephalic and atraumatic.  Mouth/Throat: No oropharyngeal exudate.  Eyes: Conjunctivae and EOM are normal. Pupils are equal, round, and reactive to light.  Neck: Normal range of motion. Neck supple.  Cardiovascular: Normal rate and regular rhythm.  No murmur heard. Pulmonary/Chest: Effort normal and breath sounds normal. No respiratory distress.  Abdominal: Soft. Bowel sounds are normal. She exhibits no distension.  Musculoskeletal: Normal range of motion. She exhibits no edema.  Lymphadenopathy:    She has no cervical adenopathy.  Neurological: She is alert and oriented to person, place, and time. No cranial nerve deficit.    Skin: Skin is warm and dry. No erythema.  Psychiatric: Affect and judgment normal.         LABORATORY DATA:  I have reviewed the data as listed Lab Results  Component Value Date   WBC 8.3 02/12/2017   HGB 14.1 02/12/2017   HCT 42.0 02/12/2017   MCV 85.2 02/12/2017   PLT 132 (L) 02/12/2017   Recent Labs    09/10/16 0908 09/18/16 0902 10/16/16 1057 12/26/16 1129  NA 133* 137 133* 137  K 3.3* 3.1* 3.3* 3.3*  CL 99* 98* 97* 100*  CO2 26 30 26 28   GLUCOSE 138* 105* 127* 118*  BUN 13 16 17 17   CREATININE 0.73 0.75 0.72 0.72  CALCIUM 8.7* 8.5* 9.0 9.1  GFRNONAA >60 >60 >60 >60  GFRAA >60 >60 >60 >60  PROT 7.5 7.0  --  8.4*  ALBUMIN 3.7 3.4*  --  3.7  AST 28 27  --  30  ALT 28 27  --  34  ALKPHOS 60 52  --  67  BILITOT 0.6 0.7  --  0.6     RADIOGRAPHIC STUDIES: I have personally reviewed the radiological images as listed and agreed with the findings in the report.   ultrasound of the abdomen shoed no spleenmegaly. Fatty liver disease.  01/17/2017 CT chest abdomen pelvis with contrast showed no pathological adenopathy was identified, aortic atherosclerosis, three-vessel coronary artery atherosclerotic calcification. Cholecystectomy, small bladder cystocele due to pelvic floor laxity. Prominent stool throughout the colon favors constipation.   ASSESSMENT & PLAN:  1. Thrombocytopenia ([place])    Patient has recurrent thrombocytopenia, responded to steroids however recurrent episodes. We have previously discussed about rituximab treatment and she agrees with treatment. Tolerates one cycle of treatment well.  Will proceed weekly treatment x 3 more session, total of 4 treatments.   Patient tells me that she feels comfortable about her diagnosis. She tells me that her son prefers patient to obtain a second opinion for reassurance.  Patient wants to be referred to [facility] to see Dr.Deventer. Will make the referral.   All questions were answered. The patient knows to call the  clinic with any problems questions or concerns. Follow up in 3 weeks.Earlie Server, MD, PhD Hematology Oncology [facility] Pager- [phone] 02/12/2017

## 2017-02-12 NOTE — Progress Notes (Signed)
Patient here today for follow up.   

## 2017-02-14 LAB — CHROMOSOME ANALYSIS, BONE MARROW

## 2017-02-19 ENCOUNTER — Inpatient Hospital Stay: Payer: BLUE CROSS/BLUE SHIELD | Attending: Oncology

## 2017-02-19 ENCOUNTER — Other Ambulatory Visit: Payer: Self-pay | Admitting: Oncology

## 2017-02-19 VITALS — BP 100/65 | HR 98 | Temp 95.9°F | Resp 20 | Wt 168.0 lb

## 2017-02-19 DIAGNOSIS — D693 Immune thrombocytopenic purpura: Secondary | ICD-10-CM | POA: Diagnosis not present

## 2017-02-19 MED ORDER — SODIUM CHLORIDE 0.9 % IV SOLN
375.0000 mg/m2 | Freq: Once | INTRAVENOUS | Status: AC
  Administered 2017-02-19: 700 mg via INTRAVENOUS
  Filled 2017-02-19: qty 50

## 2017-02-19 MED ORDER — SODIUM CHLORIDE 0.9 % IV SOLN
Freq: Once | INTRAVENOUS | Status: AC
  Administered 2017-02-19: 10:00:00 via INTRAVENOUS
  Filled 2017-02-19: qty 1000

## 2017-02-19 MED ORDER — DIPHENHYDRAMINE HCL 25 MG PO CAPS
50.0000 mg | ORAL_CAPSULE | Freq: Once | ORAL | Status: AC
  Administered 2017-02-19: 50 mg via ORAL
  Filled 2017-02-19: qty 2

## 2017-02-19 MED ORDER — SODIUM CHLORIDE 0.9 % IV SOLN: 375.0000 mg/m2 | Freq: Once | INTRAVENOUS | Status: DC

## 2017-02-19 NOTE — Progress Notes (Signed)
Pt reports intermittent right chest pain that has been ongoing for "a while, It is more than aching but only last for about ten minutes". Pt denies any chest pain at thsi time and denies any other symptoms. Per Dr. Cathie HoopsYu pt to follow up with PCP, per verbalizes understanding. Per Dr. Cathie HoopsYu okay to proceed with treatment with 02/12/17 labs.

## 2017-02-22 NOTE — Progress Notes (Signed)
Error

## 2017-02-26 ENCOUNTER — Inpatient Hospital Stay: Payer: BLUE CROSS/BLUE SHIELD

## 2017-02-26 VITALS — BP 112/75 | HR 87 | Temp 98.3°F | Resp 20

## 2017-02-26 DIAGNOSIS — D693 Immune thrombocytopenic purpura: Secondary | ICD-10-CM | POA: Diagnosis not present

## 2017-02-26 MED ORDER — SODIUM CHLORIDE 0.9 % IV SOLN
375.0000 mg/m2 | Freq: Once | INTRAVENOUS | Status: AC
  Administered 2017-02-26: 700 mg via INTRAVENOUS
  Filled 2017-02-26: qty 50

## 2017-02-26 MED ORDER — SODIUM CHLORIDE 0.9 % IV SOLN: 375.0000 mg/m2 | Freq: Once | INTRAVENOUS | Status: DC

## 2017-02-26 MED ORDER — SODIUM CHLORIDE 0.9 % IV SOLN
Freq: Once | INTRAVENOUS | Status: AC
  Administered 2017-02-26: 10:00:00 via INTRAVENOUS
  Filled 2017-02-26: qty 1000

## 2017-02-26 MED ORDER — DIPHENHYDRAMINE HCL 25 MG PO CAPS
50.0000 mg | ORAL_CAPSULE | Freq: Once | ORAL | Status: AC
  Administered 2017-02-26: 50 mg via ORAL
  Filled 2017-02-26: qty 2

## 2017-02-26 NOTE — Progress Notes (Signed)
Per Dr. Cathie HoopsYu.  No labs are needed for today.  Proceed with treatment.

## 2017-02-26 NOTE — Progress Notes (Signed)
Proceed with treatment today per Dr. Cathie HoopsYu

## 2017-03-04 NOTE — Progress Notes (Signed)
Hematology/Oncology Follow up visit [facility] [phone] [phone] Patient Care Team: Birdie Sons, MD as PCP - General (Family Medicine)  REASON FOR VISIT Follow up for treatment of thrombocytopenia  PERTINENT HEMATOLOGY HISTORY Julia Garner 67 y.o.  female who has a history of recurrent ITP. Before she was referred to me, she has a long history of ITP which were treated with sterids by primary care physician and she used to respond to steroids. She was seen by anther Hematologist Dr.Pandit many years ago. Dr.Pandit has left [place].  She had labs doneon 08/22/2016. Platelet counts were slow at 15,000. She was treated with a short course (5 days) of Steroids by Dr.Fisher and platelet recovered to 120,000, however later dropped again to 11,000 on 09/10/2016.  I treated her with a longer course of prednisone with slow tapering and her counts again responded to treatment. During the tapering the course,she visited home country [place] and had blood work done there after she finished the tapering course of Prednisone there. She reports the platelet counts were normal in [place].   Patient has had lab work up including negative hepatitis, HIV, normal LDH, normal B12 and folate level. ANA was positive and she was evaluated by rheumatologist. Based on my phone discussion with Rheumatologist, patient has had rheumatology work up and was not considered to have any autoimmune problems. She does not have hepatosplenomegaly.   Patient returned to clinic in December and repeat blood work on 12/26/2016 showed platelet count of 16,000. Treated with a course of Dexamethasone 74m x 4 days, platelet again responded and dropped again.  And she reports easy bruising, feeling tired.  Peripheral blood flowcytometry 1% of analyzed cells containing clonal B cell population which are CD5- and CD 10-.  There is mildly increased eosinophils 7%.   Patient does  not have any palpable lymphadenopathy on physical examination and we obtained CT which showed no pathological lymphadenopathy. Bone marrow biopsy showed hypercellular marrow with trilineage hematopoiesis including increased megakaryocytes. No evidence of lymphoproliferative process involvement. Normal cytogenetics.  At this point patient has had extensive work up for her thrombocytopenia, and we discussed in lengthy that she most likely have ITP, there maybe a low grade lymphoma too. Given that she has recurrent ITP and platelet counts cannot be maintained without steroids, I suggest Rituximab weekly x 4 for consolidation. Patient has requested me to talk to her son in law Dr.Kamron who is a nephrologist in [place] I have talked to Dr.Kamron about the rationale of using Rituximab and also the side effects profile. Dr.Kamron agrees the plan and has relayed information to patient. I have also discussed the side effects of Rituximab with patient. She agrees with treatment.   INTERVAL HISTORY QAustin Pongratzis a 67y.o. female who has above hematology history reviewed and edited by me today presents for follow up visit for management of thrombocytopenia.   She tolerates 4 treatments of Rituximab last week. She also got 16mDexamethasone as premedication prior to first Rituximab treatment. As she did well and she did not receive any further steroids as premedication for the remaining 3 treatments.  She reports feeling well. No bleeding or easy bruising.     Today's platelet counts are 70,000.   ROS:  Review of Systems  Constitutional: Negative for appetite change, chills and fatigue.  HENT:   Negative for hearing loss, lump/mass and nosebleeds.   Eyes: Negative for eye problems and icterus.  Respiratory: Negative for chest tightness and  cough.   Cardiovascular: Negative for chest pain and leg swelling.  Gastrointestinal: Negative for abdominal distention.  Genitourinary: Negative for difficulty  urinating and dysuria.   Musculoskeletal: Negative for gait problem and neck pain.       Had joint pain lately.   Skin: Negative for itching.  Neurological: Negative for dizziness, gait problem and headaches.  Hematological: Negative for adenopathy. Does not bruise/bleed easily.  Psychiatric/Behavioral: The patient is not nervous/anxious.     MEDICAL HISTORY:  Past Medical History:  Diagnosis Date  . Allergy   . Chronic ITP (idiopathic thrombocytopenia) (HCC)   . Depression   . Head injuries   . History of seizure 2007   one seizure   . Hypertension     SURGICAL HISTORY: Past Surgical History:  Procedure Laterality Date  . CESAREAN SECTION  1990  . CHOLECYSTECTOMY  2005  . CYST EXCISION  1996   Tracheal Cyst    SOCIAL HISTORY: Social History   Socioeconomic History  . Marital status: Married    Spouse name: Not on file  . Number of children: 3  . Years of education: Not on file  . Highest education level: Not on file  Social Needs  . Financial resource strain: Not on file  . Food insecurity - worry: Not on file  . Food insecurity - inability: Not on file  . Transportation needs - medical: Not on file  . Transportation needs - non-medical: Not on file  Occupational History  . Occupation: retired    Comment: Community education officer  Tobacco Use  . Smoking status: Never Smoker  . Smokeless tobacco: Never Used  Substance and Sexual Activity  . Alcohol use: No    Alcohol/week: 0.0 oz  . Drug use: No  . Sexual activity: Not on file  Other Topics Concern  . Not on file  Social History Narrative  . Not on file    FAMILY HISTORY: Family History  Problem Relation Age of Onset  . Heart attack Father        1st MI at age 28    ALLERGIES:  is allergic to penicillins; simcor  [niacin-simvastatin er]; and tylenol  [acetaminophen].  MEDICATIONS:  Current Outpatient Medications  Medication Sig Dispense Refill  . albuterol (PROVENTIL HFA;VENTOLIN HFA) 108 (90 Base)  MCG/ACT inhaler Inhale 2 puffs into the lungs every 6 (six) hours as needed for wheezing or shortness of breath. 1 Inhaler 0  . Bioflavonoid Products (VITAMIN C PLUS) 500 MG TABS Take 2 tablets by mouth daily.     . Calcium Carbonate-Vit D-Min (CALCIUM 1200 PO) Take by mouth.    . irbesartan-hydrochlorothiazide (AVALIDE) 150-12.5 MG tablet TAKE 1 TABLET EVERY DAY 90 tablet 4  . LORATADINE-D 24HR 10-240 MG 24 hr tablet TAKE 1 TABLET BY MOUTH DAILY AS NEEDED FOR ALLERGIES 30 tablet 12  . Naproxen Sodium (ALEVE) 220 MG CAPS Take by mouth daily as needed.    . Omega 3 1200 MG CAPS Take 2 capsules by mouth daily.    . Pseudoephedrine-Guaifenesin (MUCINEX D PO) Take by mouth.    . sertraline (ZOLOFT) 50 MG tablet TAKE 1 AND 1/2 TABLETS BY MOUTH EVERY MORNING 135 tablet 4  . simvastatin (ZOCOR) 40 MG tablet TAKE 1 TABLET BY MOUTH AT BEDTIME 90 tablet 4  . traZODone (DESYREL) 50 MG tablet TAKE ONE OR TWO TABLETS AT BEDTIME 180 tablet 4   No current facility-administered medications for this visit.       Marland Kitchen  PHYSICAL EXAMINATION:  ECOG PERFORMANCE STATUS: 0 - Asymptomatic Vitals:   03/05/17 1002  BP: 108/72  Pulse: 90  Resp: 18  Temp: 98.2 F (36.8 C)  SpO2: 97%   Filed Weights   03/05/17 1002  Weight: 170 lb 4.8 oz (77.2 kg)  Physical Exam  Constitutional: She is oriented to person, place, and time and well-developed, well-nourished, and in no distress. No distress.  HENT:  Head: Normocephalic and atraumatic.  Mouth/Throat: No oropharyngeal exudate.  Eyes: Conjunctivae and EOM are normal. Pupils are equal, round, and reactive to light.  Neck: Normal range of motion. Neck supple.  Cardiovascular: Normal rate and regular rhythm.  No murmur heard. Pulmonary/Chest: Effort normal and breath sounds normal. No respiratory distress.  Abdominal: Soft. Bowel sounds are normal. She exhibits no distension.  Musculoskeletal: Normal range of motion. She exhibits no edema.  Lymphadenopathy:     She has no cervical adenopathy.  Neurological: She is alert and oriented to person, place, and time. No cranial nerve deficit.  Skin: Skin is warm and dry. No erythema.  Psychiatric: Affect and judgment normal.         LABORATORY DATA:  I have reviewed the data as listed Lab Results  Component Value Date   WBC 7.1 03/05/2017   HGB 14.0 03/05/2017   HCT 41.4 03/05/2017   MCV 84.0 03/05/2017   PLT 70 (L) 03/05/2017   Recent Labs    09/10/16 0908 09/18/16 0902 10/16/16 1057 12/26/16 1129  NA 133* 137 133* 137  K 3.3* 3.1* 3.3* 3.3*  CL 99* 98* 97* 100*  CO2 26 30 26 28   GLUCOSE 138* 105* 127* 118*  BUN 13 16 17 17   CREATININE 0.73 0.75 0.72 0.72  CALCIUM 8.7* 8.5* 9.0 9.1  GFRNONAA >60 >60 >60 >60  GFRAA >60 >60 >60 >60  PROT 7.5 7.0  --  8.4*  ALBUMIN 3.7 3.4*  --  3.7  AST 28 27  --  30  ALT 28 27  --  34  ALKPHOS 60 52  --  67  BILITOT 0.6 0.7  --  0.6     RADIOGRAPHIC STUDIES: I have personally reviewed the radiological images as listed and agreed with the findings in the report.   ultrasound of the abdomen shoed no spleenmegaly. Fatty liver disease.  01/17/2017 CT chest abdomen pelvis with contrast showed no pathological adenopathy was identified, aortic atherosclerosis, three-vessel coronary artery atherosclerotic calcification. Cholecystectomy, small bladder cystocele due to pelvic floor laxity. Prominent stool throughout the colon favors constipation.   ASSESSMENT & PLAN:  1. Thrombocytopenia ([place])   2. Acute ITP ([place])    Patient has recurrent thrombocytopenia, responded to steroids however recurrent episodes.  The etiology of thrombocytopenia likely secondary to ITP +/- combination of very low-grade lymphoma. S/p 4 weekly treatment of Rituximab.  Today's platelet count was 70,000, which has decreased from the 132000 which was done after first rituximab treatment/dexamethasone combination. I explained to patient that rituximab takes time to exert for  effect.  70,000 still above the 55,000 baseline before she starts rituximab.  The previous platelet counts is secondary to the effect of dexamethasone. Given that white count is above 20,000, without any active bleeding or easy bruising, I will hold giving her steroids at this point. We are going to recheck her CBC count in 2 weeks and she will follow-up to be evaluated by me with repeat labs in 4 weeks.  All questions were answered. The patient knows to call the clinic with any problems  questions or concerns. Repeat CBC in 2 weeks, Follow up in 4 weeks.    Earlie Server, MD, PhD Hematology Oncology [facility] Pager- [phone] 03/05/2017

## 2017-03-05 ENCOUNTER — Inpatient Hospital Stay (HOSPITAL_BASED_OUTPATIENT_CLINIC_OR_DEPARTMENT_OTHER): Payer: BLUE CROSS/BLUE SHIELD | Admitting: Oncology

## 2017-03-05 ENCOUNTER — Inpatient Hospital Stay: Payer: BLUE CROSS/BLUE SHIELD

## 2017-03-05 ENCOUNTER — Encounter: Payer: Self-pay | Admitting: Oncology

## 2017-03-05 ENCOUNTER — Ambulatory Visit: Payer: BLUE CROSS/BLUE SHIELD

## 2017-03-05 VITALS — BP 108/72 | HR 90 | Temp 98.2°F | Resp 18 | Ht 61.5 in | Wt 170.3 lb

## 2017-03-05 DIAGNOSIS — D696 Thrombocytopenia, unspecified: Secondary | ICD-10-CM

## 2017-03-05 DIAGNOSIS — D693 Immune thrombocytopenic purpura: Secondary | ICD-10-CM

## 2017-03-05 LAB — CBC WITH DIFFERENTIAL/PLATELET
BASOS ABS: 0 10*3/uL (ref 0–0.1)
Basophils Relative: 1 %
EOS PCT: 8 %
Eosinophils Absolute: 0.6 10*3/uL (ref 0–0.7)
HCT: 41.4 % (ref 35.0–47.0)
HEMOGLOBIN: 14 g/dL (ref 12.0–16.0)
LYMPHS ABS: 2.6 10*3/uL (ref 1.0–3.6)
LYMPHS PCT: 36 %
MCH: 28.4 pg (ref 26.0–34.0)
MCHC: 33.8 g/dL (ref 32.0–36.0)
MCV: 84 fL (ref 80.0–100.0)
Monocytes Absolute: 0.7 10*3/uL (ref 0.2–0.9)
Monocytes Relative: 10 %
NEUTROS ABS: 3.2 10*3/uL (ref 1.4–6.5)
NEUTROS PCT: 45 %
Platelets: 70 10*3/uL — ABNORMAL LOW (ref 150–440)
RBC: 4.93 MIL/uL (ref 3.80–5.20)
RDW: 13.8 % (ref 11.5–14.5)
WBC: 7.1 10*3/uL (ref 3.6–11.0)

## 2017-03-05 NOTE — Progress Notes (Signed)
No new changes noted today 

## 2017-03-19 ENCOUNTER — Inpatient Hospital Stay: Payer: BLUE CROSS/BLUE SHIELD | Attending: Oncology

## 2017-03-19 DIAGNOSIS — I10 Essential (primary) hypertension: Secondary | ICD-10-CM | POA: Diagnosis not present

## 2017-03-19 DIAGNOSIS — K760 Fatty (change of) liver, not elsewhere classified: Secondary | ICD-10-CM | POA: Diagnosis not present

## 2017-03-19 DIAGNOSIS — Z886 Allergy status to analgesic agent status: Secondary | ICD-10-CM | POA: Diagnosis not present

## 2017-03-19 DIAGNOSIS — Z79899 Other long term (current) drug therapy: Secondary | ICD-10-CM | POA: Insufficient documentation

## 2017-03-19 DIAGNOSIS — D696 Thrombocytopenia, unspecified: Secondary | ICD-10-CM

## 2017-03-19 DIAGNOSIS — D693 Immune thrombocytopenic purpura: Secondary | ICD-10-CM | POA: Insufficient documentation

## 2017-03-19 DIAGNOSIS — Z880 Allergy status to penicillin: Secondary | ICD-10-CM | POA: Insufficient documentation

## 2017-03-19 LAB — CBC WITH DIFFERENTIAL/PLATELET
Basophils Absolute: 0.1 10*3/uL (ref 0–0.1)
Basophils Relative: 1 %
EOS PCT: 6 %
Eosinophils Absolute: 0.5 10*3/uL (ref 0–0.7)
HCT: 42.3 % (ref 35.0–47.0)
HEMOGLOBIN: 14.4 g/dL (ref 12.0–16.0)
LYMPHS ABS: 2.4 10*3/uL (ref 1.0–3.6)
LYMPHS PCT: 31 %
MCH: 28.8 pg (ref 26.0–34.0)
MCHC: 34.2 g/dL (ref 32.0–36.0)
MCV: 84.3 fL (ref 80.0–100.0)
MONOS PCT: 9 %
Monocytes Absolute: 0.7 10*3/uL (ref 0.2–0.9)
NEUTROS PCT: 53 %
Neutro Abs: 4.1 10*3/uL (ref 1.4–6.5)
Platelets: 98 10*3/uL — ABNORMAL LOW (ref 150–440)
RBC: 5.01 MIL/uL (ref 3.80–5.20)
RDW: 13.3 % (ref 11.5–14.5)
WBC: 7.7 10*3/uL (ref 3.6–11.0)

## 2017-04-02 ENCOUNTER — Telehealth: Payer: Self-pay | Admitting: Family Medicine

## 2017-04-02 ENCOUNTER — Other Ambulatory Visit: Payer: Self-pay

## 2017-04-02 ENCOUNTER — Inpatient Hospital Stay: Payer: BLUE CROSS/BLUE SHIELD

## 2017-04-02 ENCOUNTER — Encounter: Payer: Self-pay | Admitting: Oncology

## 2017-04-02 ENCOUNTER — Inpatient Hospital Stay (HOSPITAL_BASED_OUTPATIENT_CLINIC_OR_DEPARTMENT_OTHER): Payer: BLUE CROSS/BLUE SHIELD | Admitting: Oncology

## 2017-04-02 VITALS — BP 113/75 | HR 90 | Temp 97.9°F | Resp 18 | Wt 170.5 lb

## 2017-04-02 DIAGNOSIS — Z886 Allergy status to analgesic agent status: Secondary | ICD-10-CM | POA: Diagnosis not present

## 2017-04-02 DIAGNOSIS — Z79899 Other long term (current) drug therapy: Secondary | ICD-10-CM | POA: Diagnosis not present

## 2017-04-02 DIAGNOSIS — D693 Immune thrombocytopenic purpura: Secondary | ICD-10-CM | POA: Diagnosis not present

## 2017-04-02 DIAGNOSIS — K760 Fatty (change of) liver, not elsewhere classified: Secondary | ICD-10-CM | POA: Diagnosis not present

## 2017-04-02 DIAGNOSIS — D696 Thrombocytopenia, unspecified: Secondary | ICD-10-CM

## 2017-04-02 DIAGNOSIS — I10 Essential (primary) hypertension: Secondary | ICD-10-CM | POA: Diagnosis not present

## 2017-04-02 DIAGNOSIS — Z880 Allergy status to penicillin: Secondary | ICD-10-CM | POA: Diagnosis not present

## 2017-04-02 LAB — CBC WITH DIFFERENTIAL/PLATELET
BASOS PCT: 1 %
Basophils Absolute: 0 10*3/uL (ref 0–0.1)
EOS ABS: 0.6 10*3/uL (ref 0–0.7)
Eosinophils Relative: 9 %
HCT: 41.2 % (ref 35.0–47.0)
HEMOGLOBIN: 14 g/dL (ref 12.0–16.0)
LYMPHS ABS: 2.6 10*3/uL (ref 1.0–3.6)
Lymphocytes Relative: 36 %
MCH: 28.6 pg (ref 26.0–34.0)
MCHC: 34.1 g/dL (ref 32.0–36.0)
MCV: 84 fL (ref 80.0–100.0)
Monocytes Absolute: 0.7 10*3/uL (ref 0.2–0.9)
Monocytes Relative: 10 %
NEUTROS PCT: 44 %
Neutro Abs: 3.2 10*3/uL (ref 1.4–6.5)
Platelets: 97 10*3/uL — ABNORMAL LOW (ref 150–440)
RBC: 4.91 MIL/uL (ref 3.80–5.20)
RDW: 13.6 % (ref 11.5–14.5)
WBC: 7.1 10*3/uL (ref 3.6–11.0)

## 2017-04-02 NOTE — Progress Notes (Signed)
Here for follow up. Doing well per pt  

## 2017-04-02 NOTE — Telephone Encounter (Signed)
Patient is wanting to know if you do steroid shots in the wrist for carpal tunnel.  She states that she has gotten these at a doctor in [place] in the past but would like to come to you for this if possible.

## 2017-04-02 NOTE — Telephone Encounter (Signed)
Please advise 

## 2017-04-02 NOTE — Progress Notes (Signed)
Hematology/Oncology Follow up visit [facility] Telephone:(336) [phone] [phone]) [phone] Patient Care Team: Birdie Sons, MD as PCP - General (Family Medicine)  REASON FOR VISIT Follow up for treatment of thrombocytopenia  PERTINENT HEMATOLOGY HISTORY Tiziana Cislo 67 y.o.  female who has a history of recurrent ITP. Before she was referred to me, she has a long history of ITP which were treated with sterids by primary care physician and she used to respond to steroids. She was seen by anther Hematologist Dr.Pandit many years ago. Dr.Pandit has left [place].  She had labs doneon 08/22/2016. Platelet counts were slow at 15,000. She was treated with a short course (5 days) of Steroids by Dr.Fisher and platelet recovered to 120,000, however later dropped again to 11,000 on 09/10/2016.  I treated her with a longer course of prednisone with slow tapering and her counts again responded to treatment. During the tapering the course,she visited home country [place] and had blood work done there after she finished the tapering course of Prednisone there. She reports the platelet counts were normal in [place].   Patient has had lab work up including negative hepatitis, HIV, normal LDH, normal B12 and folate level. ANA was positive and she was evaluated by rheumatologist. Based on my phone discussion with Rheumatologist, patient has had rheumatology work up and was not considered to have any autoimmune problems. She does not have hepatosplenomegaly.   Patient returned to clinic in December and repeat blood work on 12/26/2016 showed platelet count of 16,000. Treated with a course of Dexamethasone 65m x 4 days, platelet again responded and dropped again.  And she reports easy bruising, feeling tired.  Peripheral blood flowcytometry 1% of analyzed cells containing clonal B cell population which are CD5- and CD 10-.  There is mildly increased eosinophils 7%.   Patient does  not have any palpable lymphadenopathy on physical examination and we obtained CT which showed no pathological lymphadenopathy. Bone marrow biopsy showed hypercellular marrow with trilineage hematopoiesis including increased megakaryocytes. No evidence of lymphoproliferative process involvement. Normal cytogenetics.  At this point patient has had extensive work up for her thrombocytopenia, and we discussed in lengthy that she most likely have ITP, there maybe a low grade lymphoma too. Given that she has recurrent ITP and platelet counts cannot be maintained without steroids, I suggest Rituximab weekly x 4 for consolidation. Patient has requested me to talk to her son in law Dr.Kamron who is a nephrologist in [place] I have talked to Dr.Kamron about the rationale of using Rituximab and also the side effects profile. Dr.Kamron agrees the plan and has relayed information to patient. I have also discussed the side effects of Rituximab with patient. She agrees with treatment.   INTERVAL HISTORY QRuberta Holckis a 67y.o. female who has above hematology history reviewed and edited by me today presents for follow up visit for management of thrombocytopenia.  Patient reports doing well.  Denies any acute bleeding or easy bruising.  She is status post for treatment of rituximab. She tells me that she started to use Aleve for her trigeminal neuritis as well as chronic, internal disease. Today's platelet counts are 97,000.   ROS:  Review of Systems  Constitutional: Negative for appetite change, chills and fatigue.  HENT:   Negative for hearing loss, lump/mass and nosebleeds.   Eyes: Negative for eye problems and icterus.  Respiratory: Negative for chest tightness and cough.   Cardiovascular: Negative for chest pain and leg swelling.  Gastrointestinal: Negative  for abdominal distention.  Genitourinary: Negative for difficulty urinating and dysuria.   Musculoskeletal: Negative for gait problem and neck pain.        Had joint pain lately.  Carpal tunnel syndrome.  Skin: Negative for itching.  Neurological: Negative for dizziness, gait problem and headaches.  Hematological: Negative for adenopathy. Does not bruise/bleed easily.  Psychiatric/Behavioral: The patient is not nervous/anxious.     MEDICAL HISTORY:  Past Medical History:  Diagnosis Date  . Allergy   . Chronic ITP (idiopathic thrombocytopenia) (HCC)   . Depression   . Head injuries   . History of seizure 2007   one seizure   . Hypertension     SURGICAL HISTORY: Past Surgical History:  Procedure Laterality Date  . CESAREAN SECTION  1990  . CHOLECYSTECTOMY  2005  . CYST EXCISION  1996   Tracheal Cyst    SOCIAL HISTORY: Social History   Socioeconomic History  . Marital status: Married    Spouse name: Not on file  . Number of children: 3  . Years of education: Not on file  . Highest education level: Not on file  Social Needs  . Financial resource strain: Not on file  . Food insecurity - worry: Not on file  . Food insecurity - inability: Not on file  . Transportation needs - medical: Not on file  . Transportation needs - non-medical: Not on file  Occupational History  . Occupation: retired    Comment: Community education officer  Tobacco Use  . Smoking status: Never Smoker  . Smokeless tobacco: Never Used  Substance and Sexual Activity  . Alcohol use: No    Alcohol/week: 0.0 oz  . Drug use: No  . Sexual activity: Not on file  Other Topics Concern  . Not on file  Social History Narrative  . Not on file    FAMILY HISTORY: Family History  Problem Relation Age of Onset  . Heart attack Father        1st MI at age 44    ALLERGIES:  is allergic to penicillins; simcor  [niacin-simvastatin er]; and tylenol  [acetaminophen].  MEDICATIONS:  Current Outpatient Medications  Medication Sig Dispense Refill  . b complex vitamins capsule Take 1 capsule by mouth daily.    Marland Kitchen Bioflavonoid Products (VITAMIN C PLUS) 500 MG TABS  Take 2 tablets by mouth daily.     . Calcium Carbonate-Vit D-Min (CALCIUM 1200 PO) Take by mouth.    Marland Kitchen glucosamine-chondroitin 500-400 MG tablet Take 1 tablet by mouth 3 (three) times daily.    . irbesartan-hydrochlorothiazide (AVALIDE) 150-12.5 MG tablet TAKE 1 TABLET EVERY DAY 90 tablet 4  . LORATADINE-D 24HR 10-240 MG 24 hr tablet TAKE 1 TABLET BY MOUTH DAILY AS NEEDED FOR ALLERGIES 30 tablet 12  . Naproxen Sodium (ALEVE) 220 MG CAPS Take by mouth daily as needed.    . Omega 3 1200 MG CAPS Take 2 capsules by mouth daily.    . Pseudoephedrine-Guaifenesin (MUCINEX D PO) Take by mouth.    . sertraline (ZOLOFT) 50 MG tablet TAKE 1 AND 1/2 TABLETS BY MOUTH EVERY MORNING 135 tablet 4  . simvastatin (ZOCOR) 40 MG tablet TAKE 1 TABLET BY MOUTH AT BEDTIME 90 tablet 4  . traZODone (DESYREL) 50 MG tablet TAKE ONE OR TWO TABLETS AT BEDTIME 180 tablet 4  . albuterol (PROVENTIL HFA;VENTOLIN HFA) 108 (90 Base) MCG/ACT inhaler Inhale 2 puffs into the lungs every 6 (six) hours as needed for wheezing or shortness of breath. (Patient not  taking: Reported on 03/05/2017) 1 Inhaler 0   No current facility-administered medications for this visit.       Marland Kitchen  PHYSICAL EXAMINATION: ECOG PERFORMANCE STATUS: 0 - Asymptomatic Vitals:   04/02/17 1006  BP: 113/75  Pulse: 90  Resp: 18  Temp: 97.9 F (36.6 C)   Filed Weights   04/02/17 1006  Weight: 170 lb 8 oz (77.3 kg)  Physical Exam  Constitutional: She is oriented to person, place, and time and well-developed, well-nourished, and in no distress. No distress.  HENT:  Head: Normocephalic and atraumatic.  Mouth/Throat: Oropharynx is clear and moist. No oropharyngeal exudate.  Eyes: Conjunctivae and EOM are normal. Pupils are equal, round, and reactive to light. No scleral icterus.  Neck: Normal range of motion. Neck supple. No JVD present.  Cardiovascular: Normal rate and regular rhythm.  No murmur heard. Pulmonary/Chest: Effort normal and breath sounds  normal. No respiratory distress. She has no rales.  Abdominal: Soft. Bowel sounds are normal. She exhibits no distension.  Musculoskeletal: Normal range of motion. She exhibits no edema.  Lymphadenopathy:    She has no cervical adenopathy.  Neurological: She is alert and oriented to person, place, and time. No cranial nerve deficit.  Skin: Skin is warm and dry. No rash noted. No erythema.  Psychiatric: Memory, affect and judgment normal.         LABORATORY DATA:  I have reviewed the data as listed Lab Results  Component Value Date   WBC 7.1 04/02/2017   HGB 14.0 04/02/2017   HCT 41.2 04/02/2017   MCV 84.0 04/02/2017   PLT 97 (L) 04/02/2017   Recent Labs    09/10/16 0908 09/18/16 0902 10/16/16 1057 12/26/16 1129  NA 133* 137 133* 137  K 3.3* 3.1* 3.3* 3.3*  CL 99* 98* 97* 100*  CO2 26 30 26 28   GLUCOSE 138* 105* 127* 118*  BUN 13 16 17 17   CREATININE 0.73 0.75 0.72 0.72  CALCIUM 8.7* 8.5* 9.0 9.1  GFRNONAA >60 >60 >60 >60  GFRAA >60 >60 >60 >60  PROT 7.5 7.0  --  8.4*  ALBUMIN 3.7 3.4*  --  3.7  AST 28 27  --  30  ALT 28 27  --  34  ALKPHOS 60 52  --  67  BILITOT 0.6 0.7  --  0.6     RADIOGRAPHIC STUDIES: I have personally reviewed the radiological images as listed and agreed with the findings in the report.   ultrasound of the abdomen shoed no spleenmegaly. Fatty liver disease.  01/17/2017 CT chest abdomen pelvis with contrast showed no pathological adenopathy was identified, aortic atherosclerosis, three-vessel coronary artery atherosclerotic calcification. Cholecystectomy, small bladder cystocele due to pelvic floor laxity. Prominent stool throughout the colon favors constipation.   ASSESSMENT & PLAN:  1. Thrombocytopenia ([place])   2. Acute ITP ([place])    Patient has recurrent thrombocytopenia, responded to steroids however recurrent episodes.  The etiology of thrombocytopenia likely secondary to ITP +/- combination of very low-grade lymphoma. S/p 4 weekly  treatment of Rituximab.  Today's platelet count was 97,000, which has been stable for the past few weeks.   We will continue monitor.  Anticipate  good duration of therapeutic effects from rituximab. Advised patient to avoid taking NSAIDs which may further decrease her platelet function.  I encouraged her to seek if there is any other options for treating her carpal tunnel syndrome, such as steroid injection.  She voices understanding All questions were answered. The patient knows  to call the clinic with any problems questions or concerns. Repeat CBC in 2 months.  Earlie Server, MD, PhD Hematology Oncology [facility] Pager- [phone] 04/02/2017

## 2017-04-02 NOTE — Telephone Encounter (Signed)
No answer. Will try again later. 

## 2017-04-02 NOTE — Telephone Encounter (Signed)
No, she would need to see an orthopedist for that.

## 2017-04-03 NOTE — Telephone Encounter (Signed)
Patient was advised. Patient stated that she saw an orthopedist in the past that she would like to see again. Patient stated she will contact their office and let us know if she needs a referral.

## 2017-04-08 ENCOUNTER — Encounter: Payer: Self-pay | Admitting: Family Medicine

## 2017-04-08 ENCOUNTER — Ambulatory Visit (INDEPENDENT_AMBULATORY_CARE_PROVIDER_SITE_OTHER): Payer: BLUE CROSS/BLUE SHIELD | Admitting: Family Medicine

## 2017-04-08 VITALS — BP 116/72 | HR 88 | Temp 98.4°F | Resp 16 | Wt 168.0 lb

## 2017-04-08 DIAGNOSIS — M7552 Bursitis of left shoulder: Secondary | ICD-10-CM | POA: Diagnosis not present

## 2017-04-08 DIAGNOSIS — D696 Thrombocytopenia, unspecified: Secondary | ICD-10-CM | POA: Diagnosis not present

## 2017-04-08 DIAGNOSIS — M7551 Bursitis of right shoulder: Secondary | ICD-10-CM | POA: Diagnosis not present

## 2017-04-08 MED ORDER — METHYLPREDNISOLONE ACETATE 80 MG/ML IJ SUSP: 80.0000 mg | Freq: Once | INTRAMUSCULAR | Status: DC

## 2017-04-08 NOTE — Progress Notes (Signed)
Patient: Julia Garner Female    DOB: [birth year]   67 y.o.   MRN: [id number] Visit Date: 04/08/2017  Today's Provider: Mila Merry, MD   Chief Complaint  Patient presents with  . Shoulder Pain   Subjective:    Shoulder Pain   The pain is present in the left shoulder and right shoulder. This is a new problem. Episode onset: 1 week ago. The problem occurs constantly. The problem has been gradually worsening. The quality of the pain is described as aching and sharp. Associated symptoms include a limited range of motion. Pertinent negatives include no fever. She has tried nothing for the symptoms.  Patient reports the pain in her left shoulder is worse than the right shoulder. Patient believes the pain is coming from bursitis. Has had steroid injections in the past which she reports responded well to steroid injections.  Sees orthopedics in [place] next week for carpal tunnel syndrome.  Previously on chronic prednisone for ITP, but d/c 2 months ago. Advised not to take Aleve by her hematologist      Allergies  Allergen Reactions  . Penicillins Itching    Hot flashes.  . Simcor  [Niacin-Simvastatin Er]     Flushing  . Tylenol  [Acetaminophen]      Current Outpatient Medications:  .  albuterol (PROVENTIL HFA;VENTOLIN HFA) 108 (90 Base) MCG/ACT inhaler, Inhale 2 puffs into the lungs every 6 (six) hours as needed for wheezing or shortness of breath., Disp: 1 Inhaler, Rfl: 0 .  b complex vitamins capsule, Take 1 capsule by mouth daily., Disp: , Rfl:  .  Bioflavonoid Products (VITAMIN C PLUS) 500 MG TABS, Take 2 tablets by mouth daily. , Disp: , Rfl:  .  Biotin 1000 MCG tablet, Take 1,000 mcg by mouth daily., Disp: , Rfl:  .  Calcium Carbonate-Vit D-Min (CALCIUM 1200 PO), Take by mouth., Disp: , Rfl:  .  glucosamine-chondroitin 500-400 MG tablet, Take 1 tablet by mouth 3 (three) times daily., Disp: , Rfl:  .  irbesartan-hydrochlorothiazide (AVALIDE) 150-12.5 MG tablet,  TAKE 1 TABLET EVERY DAY, Disp: 90 tablet, Rfl: 4 .  LORATADINE-D 24HR 10-240 MG 24 hr tablet, TAKE 1 TABLET BY MOUTH DAILY AS NEEDED FOR ALLERGIES, Disp: 30 tablet, Rfl: 12 .  Naproxen Sodium (ALEVE) 220 MG CAPS, Take by mouth daily as needed., Disp: , Rfl:  .  Omega 3 1200 MG CAPS, Take 2 capsules by mouth daily., Disp: , Rfl:  .  Pseudoephedrine-Guaifenesin (MUCINEX D PO), Take by mouth., Disp: , Rfl:  .  sertraline (ZOLOFT) 50 MG tablet, TAKE 1 AND 1/2 TABLETS BY MOUTH EVERY MORNING, Disp: 135 tablet, Rfl: 4 .  simvastatin (ZOCOR) 40 MG tablet, TAKE 1 TABLET BY MOUTH AT BEDTIME, Disp: 90 tablet, Rfl: 4 .  traZODone (DESYREL) 50 MG tablet, TAKE ONE OR TWO TABLETS AT BEDTIME, Disp: 180 tablet, Rfl: 4  Review of Systems  Constitutional: Negative for appetite change, chills, fatigue and fever.  Respiratory: Negative for chest tightness and shortness of breath.   Cardiovascular: Negative for chest pain and palpitations.  Gastrointestinal: Negative for abdominal pain, nausea and vomiting.  Musculoskeletal: Positive for arthralgias (bilateral shoulder pain).  Neurological: Negative for dizziness and weakness.    Social History   Tobacco Use  . Smoking status: Never Smoker  . Smokeless tobacco: Never Used  Substance Use Topics  . Alcohol use: No    Alcohol/week: 0.0 oz   Objective:   BP 116/72 (BP Location: Left  Arm, Patient Position: Sitting, Cuff Size: Large)   Pulse 88   Temp 98.4 F (36.9 C) (Oral)   Resp 16   Wt 168 lb (76.2 kg)   SpO2 97% Comment: room air  BMI 31.23 kg/m  Vitals:   04/08/17 1131  BP: 116/72  Pulse: 88  Resp: 16  Temp: 98.4 F (36.9 C)  TempSrc: Oral  SpO2: 97%  Weight: 168 lb (76.2 kg)     Physical Exam  General appearance: alert, well developed, well nourished, cooperative and in no distress Head: Normocephalic, without obvious abnormality, atraumatic Respiratory: Respirations even and unlabored, normal respiratory rate MS: Pain around  bilateral acromium, L>R. No swelling or other gross deformity.      Assessment & Plan:     1. Bursitis of both shoulders Prepped with isopropyl alcohol and iodine.  - methylPREDNISolone acetate (DEPO-MEDROL) injection 80 mg into subacromial space of left shoulder. Patient tolerated well.   2. Thrombocytopenia (HCC) Stable, currently off of prednisone and advised not to take NSAIDs per patient report.       Mila Merryonald Nicola Quesnell, MD  [facility]

## 2017-05-28 ENCOUNTER — Inpatient Hospital Stay: Payer: BLUE CROSS/BLUE SHIELD | Admitting: Oncology

## 2017-05-28 ENCOUNTER — Inpatient Hospital Stay: Payer: BLUE CROSS/BLUE SHIELD

## 2017-06-04 ENCOUNTER — Other Ambulatory Visit: Payer: BLUE CROSS/BLUE SHIELD

## 2017-06-04 ENCOUNTER — Encounter: Payer: Self-pay | Admitting: Oncology

## 2017-06-04 ENCOUNTER — Inpatient Hospital Stay (HOSPITAL_BASED_OUTPATIENT_CLINIC_OR_DEPARTMENT_OTHER): Payer: BLUE CROSS/BLUE SHIELD | Admitting: Oncology

## 2017-06-04 ENCOUNTER — Inpatient Hospital Stay: Payer: BLUE CROSS/BLUE SHIELD | Attending: Oncology

## 2017-06-04 ENCOUNTER — Ambulatory Visit: Payer: BLUE CROSS/BLUE SHIELD | Admitting: Oncology

## 2017-06-04 VITALS — BP 113/76 | HR 80 | Temp 98.0°F | Resp 18 | Ht 61.5 in | Wt 169.5 lb

## 2017-06-04 DIAGNOSIS — Z79899 Other long term (current) drug therapy: Secondary | ICD-10-CM

## 2017-06-04 DIAGNOSIS — D696 Thrombocytopenia, unspecified: Secondary | ICD-10-CM

## 2017-06-04 DIAGNOSIS — I2510 Atherosclerotic heart disease of native coronary artery without angina pectoris: Secondary | ICD-10-CM | POA: Diagnosis not present

## 2017-06-04 DIAGNOSIS — I10 Essential (primary) hypertension: Secondary | ICD-10-CM | POA: Diagnosis not present

## 2017-06-04 DIAGNOSIS — R5383 Other fatigue: Secondary | ICD-10-CM | POA: Insufficient documentation

## 2017-06-04 LAB — CBC WITH DIFFERENTIAL/PLATELET
BASOS PCT: 1 %
Basophils Absolute: 0 10*3/uL (ref 0–0.1)
EOS ABS: 0.5 10*3/uL (ref 0–0.7)
Eosinophils Relative: 7 %
HCT: 39.6 % (ref 35.0–47.0)
Hemoglobin: 13.6 g/dL (ref 12.0–16.0)
Lymphocytes Relative: 29 %
Lymphs Abs: 2.1 10*3/uL (ref 1.0–3.6)
MCH: 28.7 pg (ref 26.0–34.0)
MCHC: 34.4 g/dL (ref 32.0–36.0)
MCV: 83.5 fL (ref 80.0–100.0)
MONO ABS: 0.6 10*3/uL (ref 0.2–0.9)
MONOS PCT: 8 %
NEUTROS PCT: 55 %
Neutro Abs: 3.9 10*3/uL (ref 1.4–6.5)
PLATELETS: 121 10*3/uL — AB (ref 150–440)
RBC: 4.74 MIL/uL (ref 3.80–5.20)
RDW: 14 % (ref 11.5–14.5)
WBC: 7.1 10*3/uL (ref 3.6–11.0)

## 2017-06-04 NOTE — Progress Notes (Signed)
Hematology/Oncology Follow up visit [facility] [phone] [phone] Patient Care Team: Birdie Sons, MD as PCP - General (Family Medicine)  REASON FOR VISIT Follow up for treatment of thrombocytopenia  PERTINENT HEMATOLOGY HISTORY Julia Garner 67 y.o.  female who has a history of recurrent ITP. Before she was referred to me, she has a long history of ITP which were treated with sterids by primary care physician and she used to respond to steroids. She was seen by anther Hematologist Dr.Pandit many years ago. Dr.Pandit has left [place].  She had labs doneon 08/22/2016. Platelet counts were slow at 15,000. She was treated with a short course (5 days) of Steroids by Dr.Fisher and platelet recovered to 120,000, however later dropped again to 11,000 on 09/10/2016.  I treated her with a longer course of prednisone with slow tapering and her counts again responded to treatment. During the tapering the course,she visited home country [place] and had blood work done there after she finished the tapering course of Prednisone there. She reports the platelet counts were normal in [place].   Patient has had lab work up including negative hepatitis, HIV, normal LDH, normal B12 and folate level. ANA was positive and she was evaluated by rheumatologist. Based on my phone discussion with Rheumatologist, patient has had rheumatology work up and was not considered to have any autoimmune problems. She does not have hepatosplenomegaly.   Patient returned to clinic in December and repeat blood work on 12/26/2016 showed platelet count of 16,000. Treated with a course of Dexamethasone 86m x 4 days, platelet again responded and dropped again.  And she reports easy bruising, feeling tired.  Peripheral blood flowcytometry 1% of analyzed cells containing clonal B cell population which are CD5- and CD 10-.  There is mildly increased eosinophils 7%.   Patient does  not have any palpable lymphadenopathy on physical examination and we obtained CT which showed no pathological lymphadenopathy. Bone marrow biopsy showed hypercellular marrow with trilineage hematopoiesis including increased megakaryocytes. No evidence of lymphoproliferative process involvement. Normal cytogenetics.  At this point patient has had extensive work up for her thrombocytopenia, and we discussed in lengthy that she most likely have ITP, there maybe a low grade lymphoma too. Given that she has recurrent ITP and platelet counts cannot be maintained without steroids, I suggest Rituximab weekly x 4 for consolidation. Patient has requested me to talk to her son in law Dr.Kamron who is a nephrologist in [place] I have talked to Dr.Kamron about the rationale of using Rituximab and also the side effects profile. Dr.Kamron agrees the plan and has relayed information to patient. I have also discussed the side effects of Rituximab with patient. She agrees with treatment.   INTERVAL HISTORY Julia Jokerstis a 67y.o. female who has above hematology history reviewed and edited by me today presents for follow up visit for management of thrombocytopenia. S/p Rituximab weekly x 4.  Report doing well.  Denies hematochezia, hematuria, hematemesis, epistaxis, black tarry stool or easy bruising.    ROS:  Review of Systems  Constitutional: Negative for appetite change, chills and fatigue.  HENT:   Negative for hearing loss, lump/mass and nosebleeds.   Eyes: Negative for eye problems and icterus.  Respiratory: Negative for chest tightness and cough.   Cardiovascular: Negative for chest pain and leg swelling.  Gastrointestinal: Negative for abdominal distention.  Genitourinary: Negative for difficulty urinating and dysuria.   Musculoskeletal: Negative for gait problem and neck pain.  Had joint pain lately.  Carpal tunnel syndrome.  Skin: Negative for itching.  Neurological: Negative for dizziness, gait  problem and headaches.  Hematological: Negative for adenopathy. Does not bruise/bleed easily.  Psychiatric/Behavioral: The patient is not nervous/anxious.     MEDICAL HISTORY:  Past Medical History:  Diagnosis Date  . Allergy   . Chronic ITP (idiopathic thrombocytopenia) (HCC)   . Depression   . Head injuries   . History of seizure 2007   one seizure   . Hypertension     SURGICAL HISTORY: Past Surgical History:  Procedure Laterality Date  . CESAREAN SECTION  1990  . CHOLECYSTECTOMY  2005  . CYST EXCISION  1996   Tracheal Cyst    SOCIAL HISTORY: Social History   Socioeconomic History  . Marital status: Married    Spouse name: Not on file  . Number of children: 3  . Years of education: Not on file  . Highest education level: Not on file  Occupational History  . Occupation: retired    Comment: Community education officer  Social Needs  . Financial resource strain: Not on file  . Food insecurity:    Worry: Not on file    Inability: Not on file  . Transportation needs:    Medical: Not on file    Non-medical: Not on file  Tobacco Use  . Smoking status: Never Smoker  . Smokeless tobacco: Never Used  Substance and Sexual Activity  . Alcohol use: No    Alcohol/week: 0.0 oz  . Drug use: No  . Sexual activity: Not on file  Lifestyle  . Physical activity:    Days per week: Not on file    Minutes per session: Not on file  . Stress: Not on file  Relationships  . Social connections:    Talks on phone: Not on file    Gets together: Not on file    Attends religious service: Not on file    Active member of club or organization: Not on file    Attends meetings of clubs or organizations: Not on file    Relationship status: Not on file  . Intimate partner violence:    Fear of current or ex partner: Not on file    Emotionally abused: Not on file    Physically abused: Not on file    Forced sexual activity: Not on file  Other Topics Concern  . Not on file  Social History  Narrative  . Not on file    FAMILY HISTORY: Family History  Problem Relation Age of Onset  . Heart attack Father        1st MI at age 9    ALLERGIES:  is allergic to penicillins; simcor  [niacin-simvastatin er]; and tylenol  [acetaminophen].  MEDICATIONS:  Current Outpatient Medications  Medication Sig Dispense Refill  . b complex vitamins capsule Take 1 capsule by mouth daily.    Marland Kitchen Bioflavonoid Products (VITAMIN C PLUS) 500 MG TABS Take 2 tablets by mouth daily.     . Biotin 1000 MCG tablet Take 1,000 mcg by mouth daily.    . Calcium Carbonate-Vit D-Min (CALCIUM 1200 PO) Take by mouth.    Marland Kitchen glucosamine-chondroitin 500-400 MG tablet Take 1 tablet by mouth 3 (three) times daily.    . irbesartan-hydrochlorothiazide (AVALIDE) 150-12.5 MG tablet TAKE 1 TABLET EVERY DAY 90 tablet 4  . Omega 3 1200 MG CAPS Take 2 capsules by mouth daily.    . sertraline (ZOLOFT) 50 MG tablet TAKE 1 AND  1/2 TABLETS BY MOUTH EVERY MORNING 135 tablet 4  . simvastatin (ZOCOR) 40 MG tablet TAKE 1 TABLET BY MOUTH AT BEDTIME 90 tablet 4  . traZODone (DESYREL) 50 MG tablet TAKE ONE OR TWO TABLETS AT BEDTIME 180 tablet 4  . albuterol (PROVENTIL HFA;VENTOLIN HFA) 108 (90 Base) MCG/ACT inhaler Inhale 2 puffs into the lungs every 6 (six) hours as needed for wheezing or shortness of breath. (Patient not taking: Reported on 06/04/2017) 1 Inhaler 0  . LORATADINE-D 24HR 10-240 MG 24 hr tablet TAKE 1 TABLET BY MOUTH DAILY AS NEEDED FOR ALLERGIES (Patient not taking: Reported on 06/04/2017) 30 tablet 12  . Naproxen Sodium (ALEVE) 220 MG CAPS Take by mouth daily as needed.    . Pseudoephedrine-Guaifenesin (MUCINEX D PO) Take by mouth.     Current Facility-Administered Medications  Medication Dose Route Frequency Provider Last Rate Last Dose  . methylPREDNISolone acetate (DEPO-MEDROL) injection 80 mg  80 mg Intra-articular Once Birdie Sons, MD          .  PHYSICAL EXAMINATION: ECOG PERFORMANCE STATUS: 0 -  Asymptomatic Vitals:   06/04/17 0947  BP: 113/76  Pulse: 80  Resp: 18  Temp: 98 F (36.7 C)  SpO2: 98%   Filed Weights   06/04/17 0947  Weight: 169 lb 8 oz (76.9 kg)  Physical Exam  Constitutional: She is oriented to person, place, and time and well-developed, well-nourished, and in no distress. No distress.  HENT:  Head: Normocephalic and atraumatic.  Nose: Nose normal.  Mouth/Throat: Oropharynx is clear and moist. No oropharyngeal exudate.  Eyes: Pupils are equal, round, and reactive to light. Conjunctivae and EOM are normal. Left eye exhibits no discharge. No scleral icterus.  Neck: Normal range of motion. Neck supple. No JVD present.  Cardiovascular: Normal rate, regular rhythm and normal heart sounds.  No murmur heard. Pulmonary/Chest: Effort normal and breath sounds normal. No respiratory distress. She has no wheezes. She has no rales. She exhibits no tenderness.  Abdominal: Soft. Bowel sounds are normal. She exhibits no distension and no mass. There is no tenderness. There is no rebound.  Musculoskeletal: Normal range of motion. She exhibits no edema or tenderness.  Lymphadenopathy:    She has no cervical adenopathy.  Neurological: She is alert and oriented to person, place, and time. No cranial nerve deficit. She exhibits normal muscle tone. Coordination normal.  Skin: Skin is warm and dry. No rash noted. She is not diaphoretic. No erythema.  Psychiatric: Memory, affect and judgment normal.         LABORATORY DATA:  I have reviewed the data as listed Lab Results  Component Value Date   WBC 7.1 06/04/2017   HGB 13.6 06/04/2017   HCT 39.6 06/04/2017   MCV 83.5 06/04/2017   PLT 121 (L) 06/04/2017   Recent Labs    09/10/16 0908 09/18/16 0902 10/16/16 1057 12/26/16 1129  NA 133* 137 133* 137  K 3.3* 3.1* 3.3* 3.3*  CL 99* 98* 97* 100*  CO2 26 30 26 28   GLUCOSE 138* 105* 127* 118*  BUN 13 16 17 17   CREATININE 0.73 0.75 0.72 0.72  CALCIUM 8.7* 8.5* 9.0 9.1   GFRNONAA >60 >60 >60 >60  GFRAA >60 >60 >60 >60  PROT 7.5 7.0  --  8.4*  ALBUMIN 3.7 3.4*  --  3.7  AST 28 27  --  30  ALT 28 27  --  34  ALKPHOS 60 52  --  67  BILITOT 0.6 0.7  --  0.6     RADIOGRAPHIC STUDIES: I have personally reviewed the radiological images as listed and agreed with the findings in the report.   ultrasound of the abdomen shoed no spleenmegaly. Fatty liver disease.  01/17/2017 CT chest abdomen pelvis with contrast showed no pathological adenopathy was identified, aortic atherosclerosis, three-vessel coronary artery atherosclerotic calcification. Cholecystectomy, small bladder cystocele due to pelvic floor laxity. Prominent stool throughout the colon favors constipation.   ASSESSMENT & PLAN:  1. Thrombocytopenia ([place])    # Platelet count improved. Continue monitor counts every 3 months. The etiology of thrombocytopenia likely secondary to ITP +/- combination of very low-grade lymphoma. Anticipate  good duration of therapeutic effects from rituximab  All questions were answered. The patient knows to call the clinic with any problems questions or concerns. Follow up in 3 months and repeat labs.   Earlie Server, MD, PhD Hematology Oncology [facility] Pager- [phone] 06/04/2017

## 2017-06-04 NOTE — Progress Notes (Signed)
No new changes noted today 

## 2017-06-28 ENCOUNTER — Other Ambulatory Visit: Payer: Self-pay | Admitting: Family Medicine

## 2017-06-28 MED ORDER — LISINOPRIL-HYDROCHLOROTHIAZIDE 10-12.5 MG PO TABS: 1.0000 | ORAL_TABLET | Freq: Every day | ORAL | 3 refills | Status: DC

## 2017-08-28 DIAGNOSIS — H1045 Other chronic allergic conjunctivitis: Secondary | ICD-10-CM | POA: Diagnosis not present

## 2017-08-28 DIAGNOSIS — H2513 Age-related nuclear cataract, bilateral: Secondary | ICD-10-CM | POA: Diagnosis not present

## 2017-09-03 ENCOUNTER — Inpatient Hospital Stay (HOSPITAL_BASED_OUTPATIENT_CLINIC_OR_DEPARTMENT_OTHER): Payer: Medicare Other | Admitting: Oncology

## 2017-09-03 ENCOUNTER — Inpatient Hospital Stay: Payer: Medicare Other | Attending: Oncology

## 2017-09-03 ENCOUNTER — Encounter: Payer: Self-pay | Admitting: Oncology

## 2017-09-03 ENCOUNTER — Other Ambulatory Visit: Payer: Self-pay

## 2017-09-03 VITALS — BP 121/68 | HR 89 | Temp 97.2°F | Resp 18 | Wt 170.7 lb

## 2017-09-03 DIAGNOSIS — I700 Atherosclerosis of aorta: Secondary | ICD-10-CM

## 2017-09-03 DIAGNOSIS — Z79899 Other long term (current) drug therapy: Secondary | ICD-10-CM

## 2017-09-03 DIAGNOSIS — D693 Immune thrombocytopenic purpura: Secondary | ICD-10-CM

## 2017-09-03 DIAGNOSIS — K760 Fatty (change of) liver, not elsewhere classified: Secondary | ICD-10-CM | POA: Insufficient documentation

## 2017-09-03 DIAGNOSIS — Z9225 Personal history of immunosupression therapy: Secondary | ICD-10-CM | POA: Insufficient documentation

## 2017-09-03 DIAGNOSIS — I10 Essential (primary) hypertension: Secondary | ICD-10-CM | POA: Diagnosis not present

## 2017-09-03 DIAGNOSIS — D696 Thrombocytopenia, unspecified: Secondary | ICD-10-CM

## 2017-09-03 LAB — CBC WITH DIFFERENTIAL/PLATELET
Basophils Absolute: 0 10*3/uL (ref 0–0.1)
Basophils Relative: 1 %
EOS ABS: 0.6 10*3/uL (ref 0–0.7)
Eosinophils Relative: 8 %
HCT: 42.1 % (ref 35.0–47.0)
HEMOGLOBIN: 14.3 g/dL (ref 12.0–16.0)
LYMPHS ABS: 2.6 10*3/uL (ref 1.0–3.6)
Lymphocytes Relative: 35 %
MCH: 29 pg (ref 26.0–34.0)
MCHC: 33.9 g/dL (ref 32.0–36.0)
MCV: 85.6 fL (ref 80.0–100.0)
Monocytes Absolute: 0.7 10*3/uL (ref 0.2–0.9)
Monocytes Relative: 10 %
NEUTROS ABS: 3.5 10*3/uL (ref 1.4–6.5)
NEUTROS PCT: 46 %
Platelets: 133 10*3/uL — ABNORMAL LOW (ref 150–440)
RBC: 4.92 MIL/uL (ref 3.80–5.20)
RDW: 13.1 % (ref 11.5–14.5)
WBC: 7.4 10*3/uL (ref 3.6–11.0)

## 2017-09-03 NOTE — Progress Notes (Signed)
Patient here for follow up. No changes since last visit.   

## 2017-09-03 NOTE — Progress Notes (Signed)
Hematology/Oncology Follow up visit [facility] [phone] [phone] Patient Care Team: Birdie Sons, MD as PCP - General (Family Medicine)  REASON FOR VISIT Follow up for treatment of thrombocytopenia  PERTINENT HEMATOLOGY HISTORY Elisavet Buehrer 67 y.o.  female who has a history of recurrent ITP. Before she was referred to me, she has a long history of ITP which were treated with sterids by primary care physician and she used to respond to steroids. She was seen by anther Hematologist Dr.Pandit many years ago. Dr.Pandit has left [place].  She had labs doneon 08/22/2016. Platelet counts were slow at 15,000. She was treated with a short course (5 days) of Steroids by Dr.Fisher and platelet recovered to 120,000, however later dropped again to 11,000 on 09/10/2016.  I treated her with a longer course of prednisone with slow tapering and her counts again responded to treatment. During the tapering the course,she visited home country [place] and had blood work done there after she finished the tapering course of Prednisone there. She reports the platelet counts were normal in [place].   Patient has had lab work up including negative hepatitis, HIV, normal LDH, normal B12 and folate level. ANA was positive and she was evaluated by rheumatologist. Based on my phone discussion with Rheumatologist, patient has had rheumatology work up and was not considered to have any autoimmune problems. She does not have hepatosplenomegaly.   Patient returned to clinic in December and repeat blood work on 12/26/2016 showed platelet count of 16,000. Treated with a course of Dexamethasone 32m x 4 days, platelet again responded and dropped again.  And she reports easy bruising, feeling tired.  Peripheral blood flowcytometry 1% of analyzed cells containing clonal B cell population which are CD5- and CD 10-.  There is mildly increased eosinophils 7%.   Patient does  not have any palpable lymphadenopathy on physical examination and we obtained CT which showed no pathological lymphadenopathy. Bone marrow biopsy showed hypercellular marrow with trilineage hematopoiesis including increased megakaryocytes. No evidence of lymphoproliferative process involvement. Normal cytogenetics.  At this point patient has had extensive work up for her thrombocytopenia, and we discussed in lengthy that she most likely have ITP, there maybe a low grade lymphoma too. Given that she has recurrent ITP and platelet counts cannot be maintained without steroids, I suggest Rituximab weekly x 4 for consolidation. Patient has requested me to talk to her son in law Dr.Kamron who is a nephrologist in [place] I have talked to Dr.Kamron about the rationale of using Rituximab and also the side effects profile. Dr.Kamron agrees the plan and has relayed information to patient. I have also discussed the side effects of Rituximab with patient. She agrees with treatment.   INTERVAL HISTORY QPamalee Marcoeis a 67y.o. female who has above hematology history reviewed by me today present for follow-up visit for management of thrombocytopenia. In the past she has received rituximab weekly x4.  She reports doing well.  Denies any fatigue, weight loss, night sweating.  Denies any fever or chills.  Denies any easy bruisability or active bleeding events.    ROS:  Review of Systems  Constitutional: Negative for appetite change, chills and fatigue.  HENT:   Negative for hearing loss, lump/mass and nosebleeds.   Eyes: Negative for eye problems and icterus.  Respiratory: Negative for chest tightness and cough.   Cardiovascular: Negative for chest pain and leg swelling.  Gastrointestinal: Negative for abdominal distention.  Genitourinary: Negative for difficulty urinating and dysuria.  Musculoskeletal: Negative for gait problem and neck pain.  Skin: Negative for itching.  Neurological: Negative for dizziness,  gait problem and headaches.  Hematological: Negative for adenopathy. Does not bruise/bleed easily.  Psychiatric/Behavioral: The patient is not nervous/anxious.     MEDICAL HISTORY:  Past Medical History:  Diagnosis Date  . Allergy   . Chronic ITP (idiopathic thrombocytopenia) (HCC)   . Depression   . Head injuries   . History of seizure 2007   one seizure   . Hypertension     SURGICAL HISTORY: Past Surgical History:  Procedure Laterality Date  . CESAREAN SECTION  1990  . CHOLECYSTECTOMY  2005  . CYST EXCISION  1996   Tracheal Cyst    SOCIAL HISTORY: Social History   Socioeconomic History  . Marital status: Married    Spouse name: Not on file  . Number of children: 3  . Years of education: Not on file  . Highest education level: Not on file  Occupational History  . Occupation: retired    Comment: Community education officer  Social Needs  . Financial resource strain: Not on file  . Food insecurity:    Worry: Not on file    Inability: Not on file  . Transportation needs:    Medical: Not on file    Non-medical: Not on file  Tobacco Use  . Smoking status: Never Smoker  . Smokeless tobacco: Never Used  Substance and Sexual Activity  . Alcohol use: No    Alcohol/week: 0.0 standard drinks  . Drug use: No  . Sexual activity: Not on file  Lifestyle  . Physical activity:    Days per week: Not on file    Minutes per session: Not on file  . Stress: Not on file  Relationships  . Social connections:    Talks on phone: Not on file    Gets together: Not on file    Attends religious service: Not on file    Active member of club or organization: Not on file    Attends meetings of clubs or organizations: Not on file    Relationship status: Not on file  . Intimate partner violence:    Fear of current or ex partner: Not on file    Emotionally abused: Not on file    Physically abused: Not on file    Forced sexual activity: Not on file  Other Topics Concern  . Not on file    Social History Narrative  . Not on file    FAMILY HISTORY: Family History  Problem Relation Age of Onset  . Heart attack Father        1st MI at age 37    ALLERGIES:  is allergic to penicillins; simcor  [niacin-simvastatin er]; and tylenol  [acetaminophen].  MEDICATIONS:  Current Outpatient Medications  Medication Sig Dispense Refill  . b complex vitamins capsule Take 1 capsule by mouth daily.    Marland Kitchen Bioflavonoid Products (VITAMIN C PLUS) 500 MG TABS Take 2 tablets by mouth daily.     . Biotin 1000 MCG tablet Take 1,000 mcg by mouth daily.    . Calcium Carbonate-Vit D-Min (CALCIUM 1200 PO) Take by mouth.    Marland Kitchen glucosamine-chondroitin 500-400 MG tablet Take 1 tablet by mouth 3 (three) times daily.    . irbesartan-hydrochlorothiazide (AVALIDE) 150-12.5 MG tablet Take 1 tab by mouth daily    . LORATADINE-D 24HR 10-240 MG 24 hr tablet TAKE 1 TABLET BY MOUTH DAILY AS NEEDED FOR ALLERGIES 30 tablet 12  .  Naproxen Sodium (ALEVE) 220 MG CAPS Take by mouth daily as needed.    . Omega 3 1200 MG CAPS Take 2 capsules by mouth daily.    . Pseudoephedrine-Guaifenesin (MUCINEX D PO) Take by mouth.    . sertraline (ZOLOFT) 50 MG tablet TAKE 1 AND 1/2 TABLETS BY MOUTH EVERY MORNING 135 tablet 4  . simvastatin (ZOCOR) 40 MG tablet TAKE 1 TABLET BY MOUTH AT BEDTIME 90 tablet 4  . traZODone (DESYREL) 50 MG tablet TAKE ONE OR TWO TABLETS AT BEDTIME 180 tablet 4  . albuterol (PROVENTIL HFA;VENTOLIN HFA) 108 (90 Base) MCG/ACT inhaler Inhale 2 puffs into the lungs every 6 (six) hours as needed for wheezing or shortness of breath. (Patient not taking: Reported on 06/04/2017) 1 Inhaler 0  . lisinopril-hydrochlorothiazide (PRINZIDE,ZESTORETIC) 10-12.5 MG tablet Take 1 tablet by mouth daily. (Patient not taking: Reported on 09/03/2017) 90 tablet 3   Current Facility-Administered Medications  Medication Dose Route Frequency Provider Last Rate Last Dose  . methylPREDNISolone acetate (DEPO-MEDROL) injection 80 mg   80 mg Intra-articular Once Birdie Sons, MD          .  PHYSICAL EXAMINATION: ECOG PERFORMANCE STATUS: 0 - Asymptomatic Vitals:   09/03/17 0950  BP: 121/68  Pulse: 89  Resp: 18  Temp: (!) 97.2 F (36.2 C)   Filed Weights   09/03/17 0950  Weight: 170 lb 11.2 oz (77.4 kg)  Physical Exam  Constitutional: She is oriented to person, place, and time and well-developed, well-nourished, and in no distress. No distress.  HENT:  Head: Normocephalic and atraumatic.  Nose: Nose normal.  Mouth/Throat: Oropharynx is clear and moist. No oropharyngeal exudate.  Eyes: Pupils are equal, round, and reactive to light. Conjunctivae and EOM are normal. Left eye exhibits no discharge. No scleral icterus.  Neck: Normal range of motion. Neck supple. No JVD present.  Cardiovascular: Normal rate, regular rhythm and normal heart sounds.  No murmur heard. Pulmonary/Chest: Effort normal and breath sounds normal. No respiratory distress. She has no wheezes. She has no rales. She exhibits no tenderness.  Abdominal: Soft. Bowel sounds are normal. She exhibits no distension and no mass. There is no tenderness. There is no rebound.  Musculoskeletal: Normal range of motion. She exhibits no edema or tenderness.  Lymphadenopathy:    She has no cervical adenopathy.  Neurological: She is alert and oriented to person, place, and time. No cranial nerve deficit. She exhibits normal muscle tone. Coordination normal.  Skin: Skin is warm and dry. No rash noted. She is not diaphoretic. No erythema.  Psychiatric: Memory, affect and judgment normal.         LABORATORY DATA:  I have reviewed the data as listed Lab Results  Component Value Date   WBC 7.4 09/03/2017   HGB 14.3 09/03/2017   HCT 42.1 09/03/2017   MCV 85.6 09/03/2017   PLT 133 (L) 09/03/2017   Recent Labs    09/10/16 0908 09/18/16 0902 10/16/16 1057 12/26/16 1129  NA 133* 137 133* 137  K 3.3* 3.1* 3.3* 3.3*  CL 99* 98* 97* 100*  CO2 _0 GLUCOSE 138* 105* 127* 118*  BUN _1 CREATININE 0.73 0.75 0.72 0.72  CALCIUM 8.7* 8.5* 9.0 9.1  GFRNONAA >60 >60 >60 >60  GFRAA >60 >60 >60 >60  PROT 7.5 7.0  --  8.4*  ALBUMIN 3.7 3.4*  --  3.7  AST 28 27  --  30  ALT 28 27  --  34  ALKPHOS 60 52  --  67  BILITOT 0.6 0.7  --  0.6     RADIOGRAPHIC STUDIES: I have personally reviewed the radiological images as listed and agreed with the findings in the report.   ultrasound of the abdomen showed  no spleenmegaly. Fatty liver disease.  01/17/2017 CT chest abdomen pelvis with contrast showed no pathological adenopathy was identified, aortic atherosclerosis, three-vessel coronary artery atherosclerotic calcification. Cholecystectomy, small bladder cystocele due to pelvic floor laxity. Prominent stool throughout the colon favors constipation.   ASSESSMENT & PLAN:  1. Chronic ITP (idiopathic thrombocytopenia) (HCC)   2. Fatty liver disease, nonalcoholic   3. Aortic atherosclerosis ([place])    #Labs reviewed and discussed with patient.  Platelet counts have further improved to 133. Continue monitor every 3 months.  Discussed with patient.  # Fatty liver disease and aorta atherosclerosis, advise patient to follow up with primary care physician and also discussed life style modification including diet, exercise, etc. She voices understanding.   All questions were answered. The patient knows to call the clinic with any problems questions or concerns. Follow up in 3 months and repeat labs.  Total face to face encounter time for this patient visit was 15 min. >50% of the time was  spent in counseling and coordination of care.  Earlie Server, MD, PhD Hematology Oncology [facility] Pager- [phone] 09/03/2017

## 2017-09-11 DIAGNOSIS — H25813 Combined forms of age-related cataract, bilateral: Secondary | ICD-10-CM | POA: Diagnosis not present

## 2017-09-11 DIAGNOSIS — H43813 Vitreous degeneration, bilateral: Secondary | ICD-10-CM | POA: Diagnosis not present

## 2017-09-13 ENCOUNTER — Ambulatory Visit (INDEPENDENT_AMBULATORY_CARE_PROVIDER_SITE_OTHER): Payer: Medicare Other | Admitting: Family Medicine

## 2017-09-13 ENCOUNTER — Encounter: Payer: Self-pay | Admitting: Family Medicine

## 2017-09-13 VITALS — BP 116/74 | HR 87 | Temp 97.9°F | Resp 16 | Wt 169.0 lb

## 2017-09-13 DIAGNOSIS — E785 Hyperlipidemia, unspecified: Secondary | ICD-10-CM

## 2017-09-13 DIAGNOSIS — R55 Syncope and collapse: Secondary | ICD-10-CM | POA: Diagnosis not present

## 2017-09-13 DIAGNOSIS — I10 Essential (primary) hypertension: Secondary | ICD-10-CM | POA: Diagnosis not present

## 2017-09-13 DIAGNOSIS — K760 Fatty (change of) liver, not elsewhere classified: Secondary | ICD-10-CM | POA: Diagnosis not present

## 2017-09-13 DIAGNOSIS — R739 Hyperglycemia, unspecified: Secondary | ICD-10-CM

## 2017-09-13 NOTE — Progress Notes (Signed)
Patient: Julia Garner Female    DOB: 07/15/1950   67 y.o.   MRN: [id number] Visit Date: 09/13/2017  Today's Provider: Mila Merryonald Fisher, MD   No chief complaint on file.  Subjective:    HPI  Patient is here to discuss abdominal CT scan findings, ordered by Dr. Cathie HoopsYu. CT scan showed fatty liver disease and coronary artery atherosclerosis.   She also reports she has been having dizzy, light headed spells which had been occurring rarely for a few year, but lately have been occurring just about every day. Sometimes feels heart racing during episodes. No chest pains. No other neurological sx.   Allergies  Allergen Reactions  . Penicillins Itching    Hot flashes.  . Simcor  [Niacin-Simvastatin Er]     Flushing  . Tylenol  [Acetaminophen]      Current Outpatient Medications:  .  albuterol (PROVENTIL HFA;VENTOLIN HFA) 108 (90 Base) MCG/ACT inhaler, Inhale 2 puffs into the lungs every 6 (six) hours as needed for wheezing or shortness of breath., Disp: 1 Inhaler, Rfl: 0 .  b complex vitamins capsule, Take 1 capsule by mouth daily., Disp: , Rfl:  .  Bioflavonoid Products (VITAMIN C PLUS) 500 MG TABS, Take 2 tablets by mouth daily. , Disp: , Rfl:  .  Biotin 1000 MCG tablet, Take 1,000 mcg by mouth daily., Disp: , Rfl:  .  Calcium Carbonate-Vit D-Min (CALCIUM 1200 PO), Take by mouth., Disp: , Rfl:  .  glucosamine-chondroitin 500-400 MG tablet, Take 1 tablet by mouth 3 (three) times daily., Disp: , Rfl:  .  irbesartan-hydrochlorothiazide (AVALIDE) 150-12.5 MG tablet, Take 1 tab by mouth daily, Disp: , Rfl:  .  LORATADINE-D 24HR 10-240 MG 24 hr tablet, TAKE 1 TABLET BY MOUTH DAILY AS NEEDED FOR ALLERGIES, Disp: 30 tablet, Rfl: 12 .  Naproxen Sodium (ALEVE) 220 MG CAPS, Take by mouth daily as needed., Disp: , Rfl:  .  Omega 3 1200 MG CAPS, Take 2 capsules by mouth daily., Disp: , Rfl:  .  sertraline (ZOLOFT) 50 MG tablet, TAKE 1 AND 1/2 TABLETS BY MOUTH EVERY MORNING, Disp: 135 tablet, Rfl:  4 .  simvastatin (ZOCOR) 40 MG tablet, TAKE 1 TABLET BY MOUTH AT BEDTIME, Disp: 90 tablet, Rfl: 4 .  traZODone (DESYREL) 50 MG tablet, TAKE ONE OR TWO TABLETS AT BEDTIME, Disp: 180 tablet, Rfl: 4 .  lisinopril-hydrochlorothiazide (PRINZIDE,ZESTORETIC) 10-12.5 MG tablet, Take 1 tablet by mouth daily. (Patient not taking: Reported on 09/13/2017), Disp: 90 tablet, Rfl: 3 .  Pseudoephedrine-Guaifenesin (MUCINEX D PO), Take by mouth., Disp: , Rfl:   Current Facility-Administered Medications:  .  methylPREDNISolone acetate (DEPO-MEDROL) injection 80 mg, 80 mg, Intra-articular, Once, Fisher, Demetrios Isaacsonald E, MD  Review of Systems  Constitutional: Negative for appetite change, chills, fatigue and fever.  Respiratory: Negative for chest tightness and shortness of breath.   Cardiovascular: Negative for chest pain and palpitations.  Gastrointestinal: Negative for abdominal pain, nausea and vomiting.  Neurological: Negative for dizziness and weakness.    Social History   Tobacco Use  . Smoking status: Never Smoker  . Smokeless tobacco: Never Used  Substance Use Topics  . Alcohol use: No    Alcohol/week: 0.0 standard drinks   Objective:   BP 116/74 (BP Location: Right Arm, Patient Position: Sitting, Cuff Size: Large)   Pulse 87   Temp 97.9 F (36.6 C) (Oral)   Resp 16   Wt 169 lb (76.7 kg)   SpO2 98%   BMI  31.42 kg/m  Vitals:   09/13/17 1356  BP: 116/74  Pulse: 87  Resp: 16  Temp: 97.9 F (36.6 C)  TempSrc: Oral  SpO2: 98%  Weight: 169 lb (76.7 kg)     Physical Exam  General Appearance:    Alert, cooperative, no distress, mildly obese  Eyes:    PERRL, conjunctiva/corneas clear, EOM's intact       Lungs:     Clear to auscultation bilaterally, respirations unlabored  Heart:    Regular rate and rhythm  Neurologic:   Awake, alert, oriented x 3. No apparent focal neurological           defect.      EKG: Old inferior infarct, unchanged compared to EKG of 06-10-2013     Assessment &  Plan:     1. Fatty liver Counseled on avoiding sweets and starchy foods. Counseled on benefits of hepatitis vaccines. 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 Check titers for hep a and b.  - Hepatitis B surface antibody,qualitative - Hepatitis A Ab, Total  2. Hyperglycemia  - Hemoglobin A1c  3. Hyperlipidemia, unspecified hyperlipidemia type She is tolerating simvastatin well with no adverse effects.   - Lipid panel - Comprehensive metabolic panel - TSH  4. Syncope, unspecified syncope type  - Holter monitor - 48 hour; Future  5. Essential (primary) hypertension Well controlled.  Continue current medications.   - EKG 12-Lead       Mila Merry, MD  [facility]

## 2017-09-14 LAB — COMPREHENSIVE METABOLIC PANEL
ALBUMIN: 3.9 g/dL (ref 3.6–4.8)
ALK PHOS: 73 IU/L (ref 39–117)
ALT: 20 IU/L (ref 0–32)
AST: 17 IU/L (ref 0–40)
Albumin/Globulin Ratio: 1.2 (ref 1.2–2.2)
BUN/Creatinine Ratio: 17 (ref 12–28)
BUN: 12 mg/dL (ref 8–27)
Bilirubin Total: 0.3 mg/dL (ref 0.0–1.2)
CO2: 25 mmol/L (ref 20–29)
CREATININE: 0.71 mg/dL (ref 0.57–1.00)
Calcium: 9.5 mg/dL (ref 8.7–10.3)
Chloride: 100 mmol/L (ref 96–106)
GFR calc Af Amer: 103 mL/min/{1.73_m2} (ref >59)
GFR calc non Af Amer: 90 mL/min/{1.73_m2} (ref >59)
GLOBULIN, TOTAL: 3.3 g/dL (ref 1.5–4.5)
Glucose: 106 mg/dL — ABNORMAL HIGH (ref 65–99)
Potassium: 3.5 mmol/L (ref 3.5–5.2)
Sodium: 140 mmol/L (ref 134–144)
Total Protein: 7.2 g/dL (ref 6.0–8.5)

## 2017-09-14 LAB — LIPID PANEL
CHOL/HDL RATIO: 3.9 ratio (ref 0.0–4.4)
Cholesterol, Total: 120 mg/dL (ref 100–199)
HDL: 31 mg/dL — AB (ref >39)
LDL Calculated: 40 mg/dL (ref 0–99)
TRIGLYCERIDES: 247 mg/dL — AB (ref 0–149)
VLDL Cholesterol Cal: 49 mg/dL — ABNORMAL HIGH (ref 5–40)

## 2017-09-14 LAB — HEPATITIS B SURFACE ANTIBODY,QUALITATIVE: Hep B Surface Ab, Qual: NONREACTIVE

## 2017-09-14 LAB — HEMOGLOBIN A1C
Est. average glucose Bld gHb Est-mCnc: 140 mg/dL
HEMOGLOBIN A1C: 6.5 % — AB (ref 4.8–5.6)

## 2017-09-14 LAB — TSH: TSH: 2.87 u[IU]/mL (ref 0.450–4.500)

## 2017-09-14 LAB — HEPATITIS A ANTIBODY, TOTAL: Hep A Total Ab: POSITIVE — AB

## 2017-09-15 ENCOUNTER — Encounter: Payer: Self-pay | Admitting: Family Medicine

## 2017-09-15 DIAGNOSIS — E119 Type 2 diabetes mellitus without complications: Secondary | ICD-10-CM | POA: Insufficient documentation

## 2017-09-20 ENCOUNTER — Telehealth: Payer: Self-pay | Admitting: Family Medicine

## 2017-09-20 NOTE — Telephone Encounter (Signed)
Please advise. Thanks.  

## 2017-09-20 NOTE — Telephone Encounter (Signed)
The cardiovascular should call her. They are closed now. We need to call first thing Monday and find out what the delay is.

## 2017-09-20 NOTE — Telephone Encounter (Signed)
Called asking if we have set her up for a holter monitor. She said some one called her this week.   I do not see a phone message regarding this.  Pt's call back [phone]  Thanks teri

## 2017-09-24 ENCOUNTER — Ambulatory Visit (INDEPENDENT_AMBULATORY_CARE_PROVIDER_SITE_OTHER): Payer: Medicare Other | Admitting: Family Medicine

## 2017-09-24 DIAGNOSIS — Z23 Encounter for immunization: Secondary | ICD-10-CM

## 2017-09-24 NOTE — Telephone Encounter (Signed)
Per Maye Hides, patient is scheduled for Holter Monitor on 09/27/2017 @11 :00 am at [facility]. Maralyn Sago stated she will contact patient with information.

## 2017-09-26 ENCOUNTER — Other Ambulatory Visit: Payer: Self-pay | Admitting: Family Medicine

## 2017-09-27 ENCOUNTER — Ambulatory Visit
Admission: RE | Admit: 2017-09-27 | Discharge: 2017-09-27 | Disposition: A | Discharge status: Home / Self Care | Payer: Medicare Other | Source: Ambulatory Visit | Attending: Family Medicine | Admitting: Family Medicine

## 2017-09-27 ENCOUNTER — Other Ambulatory Visit: Payer: Self-pay | Admitting: Family Medicine

## 2017-09-27 DIAGNOSIS — R55 Syncope and collapse: Secondary | ICD-10-CM | POA: Diagnosis not present

## 2017-09-27 MED ORDER — LORATADINE-PSEUDOEPHEDRINE ER 10-240 MG PO TB24: 1.0000 | ORAL_TABLET | Freq: Every day | ORAL | 2 refills | Status: DC | PRN

## 2017-09-27 NOTE — Telephone Encounter (Signed)
Pt  Needs refill on   Loratadine d 10 mg  90 day supple  [facility] [address]  Thanks teri

## 2017-09-30 ENCOUNTER — Other Ambulatory Visit: Payer: Self-pay | Admitting: Family Medicine

## 2017-09-30 NOTE — Telephone Encounter (Signed)
[facility] faxed a refill request for the following medication. Thanks CC ° °loratadine-pseudoephedrine (LORATADINE-D 24HR) 10-240 MG 24 hr tablet  ° °

## 2017-10-02 ENCOUNTER — Ambulatory Visit
Admission: RE | Admit: 2017-10-02 | Discharge: 2017-10-02 | Disposition: A | Discharge status: Home / Self Care | Payer: Medicare Other | Source: Ambulatory Visit | Attending: Family Medicine | Admitting: Family Medicine

## 2017-10-02 ENCOUNTER — Other Ambulatory Visit: Payer: Self-pay | Admitting: Family Medicine

## 2017-10-02 DIAGNOSIS — R55 Syncope and collapse: Secondary | ICD-10-CM | POA: Diagnosis not present

## 2017-10-02 NOTE — Telephone Encounter (Signed)
[facility] faxed a refill request for the following medication. Thanks CC  loratadine-pseudoephedrine (LORATADINE-D 24HR) 10-240 MG 24 hr tablet

## 2017-10-04 MED ORDER — LORATADINE-PSEUDOEPHEDRINE ER 10-240 MG PO TB24: 1.0000 | ORAL_TABLET | Freq: Every day | ORAL | 2 refills | Status: DC | PRN

## 2017-10-07 ENCOUNTER — Telehealth: Payer: Self-pay

## 2017-10-07 DIAGNOSIS — H524 Presbyopia: Secondary | ICD-10-CM | POA: Diagnosis not present

## 2017-10-07 DIAGNOSIS — H25811 Combined forms of age-related cataract, right eye: Secondary | ICD-10-CM | POA: Diagnosis not present

## 2017-10-07 NOTE — Telephone Encounter (Signed)
Patient advised as below. Patient reports she will call back when she is ready for referral.

## 2017-10-07 NOTE — Telephone Encounter (Signed)
-----   Message from Malva Limesonald E Fisher, MD sent at 10/04/2017  2:26 PM EDT ----- Holter monitor is normal. No sign of any arrhthymias. If still having dizzy spells then recommend referral to neurology.

## 2017-10-10 ENCOUNTER — Other Ambulatory Visit: Payer: Self-pay | Admitting: Family Medicine

## 2017-10-18 ENCOUNTER — Other Ambulatory Visit: Payer: Self-pay | Admitting: Family Medicine

## 2017-10-18 MED ORDER — TRAZODONE HCL 50 MG PO TABS: ORAL_TABLET | ORAL | 4 refills | Status: DC

## 2017-10-18 MED ORDER — IRBESARTAN-HYDROCHLOROTHIAZIDE 150-12.5 MG PO TABS: 1.0000 | ORAL_TABLET | Freq: Every day | ORAL | 4 refills | Status: DC

## 2017-10-18 MED ORDER — SERTRALINE HCL 50 MG PO TABS: ORAL_TABLET | ORAL | 4 refills | Status: DC

## 2017-10-18 MED ORDER — SIMVASTATIN 40 MG PO TABS: 40.0000 mg | ORAL_TABLET | Freq: Every day | ORAL | 4 refills | Status: DC

## 2017-10-18 NOTE — Telephone Encounter (Signed)
[facility] faxed refill request for the following medications:  irbesartan-hydrochlorothiazide (AVALIDE) 150-12.5 MG tablet  Please advise.

## 2017-10-18 NOTE — Telephone Encounter (Signed)
[facility] faxed refill request for the following medications:  simvastatin (ZOCOR) 40 MG tablet  traZODone (DESYREL) 50 MG tablet  sertraline (ZOLOFT) 50 MG tablet  Please advise.

## 2017-10-22 ENCOUNTER — Other Ambulatory Visit: Payer: Self-pay | Admitting: Family Medicine

## 2017-10-24 DIAGNOSIS — H524 Presbyopia: Secondary | ICD-10-CM | POA: Diagnosis not present

## 2017-10-24 DIAGNOSIS — H25812 Combined forms of age-related cataract, left eye: Secondary | ICD-10-CM | POA: Diagnosis not present

## 2017-10-28 ENCOUNTER — Ambulatory Visit: Payer: Self-pay | Admitting: Family Medicine

## 2017-10-30 ENCOUNTER — Ambulatory Visit (INDEPENDENT_AMBULATORY_CARE_PROVIDER_SITE_OTHER): Payer: Medicare Other | Admitting: Family Medicine

## 2017-10-30 DIAGNOSIS — K760 Fatty (change of) liver, not elsewhere classified: Secondary | ICD-10-CM | POA: Diagnosis not present

## 2017-10-30 DIAGNOSIS — Z23 Encounter for immunization: Secondary | ICD-10-CM | POA: Diagnosis not present

## 2017-10-30 NOTE — Progress Notes (Signed)
Vaccine administration only. No E&M service today.   

## 2017-11-19 DIAGNOSIS — H2512 Age-related nuclear cataract, left eye: Secondary | ICD-10-CM | POA: Diagnosis not present

## 2017-11-19 DIAGNOSIS — H2511 Age-related nuclear cataract, right eye: Secondary | ICD-10-CM | POA: Diagnosis not present

## 2017-11-22 DIAGNOSIS — H26491 Other secondary cataract, right eye: Secondary | ICD-10-CM | POA: Diagnosis not present

## 2017-12-03 ENCOUNTER — Inpatient Hospital Stay: Payer: Medicare Other | Attending: Oncology

## 2017-12-03 ENCOUNTER — Other Ambulatory Visit: Payer: Self-pay

## 2017-12-03 ENCOUNTER — Encounter: Payer: Self-pay | Admitting: Oncology

## 2017-12-03 ENCOUNTER — Inpatient Hospital Stay (HOSPITAL_BASED_OUTPATIENT_CLINIC_OR_DEPARTMENT_OTHER): Payer: Medicare Other | Admitting: Oncology

## 2017-12-03 VITALS — BP 118/80 | HR 85 | Temp 99.0°F | Resp 18 | Wt 168.1 lb

## 2017-12-03 DIAGNOSIS — I10 Essential (primary) hypertension: Secondary | ICD-10-CM | POA: Insufficient documentation

## 2017-12-03 DIAGNOSIS — K760 Fatty (change of) liver, not elsewhere classified: Secondary | ICD-10-CM | POA: Insufficient documentation

## 2017-12-03 DIAGNOSIS — Z79899 Other long term (current) drug therapy: Secondary | ICD-10-CM

## 2017-12-03 DIAGNOSIS — R5383 Other fatigue: Secondary | ICD-10-CM

## 2017-12-03 DIAGNOSIS — D693 Immune thrombocytopenic purpura: Secondary | ICD-10-CM | POA: Diagnosis not present

## 2017-12-03 LAB — CBC WITH DIFFERENTIAL/PLATELET
Abs Immature Granulocytes: 0.02 10*3/uL (ref 0.00–0.07)
BASOS ABS: 0 10*3/uL (ref 0.0–0.1)
Basophils Relative: 0 %
EOS ABS: 0.5 10*3/uL (ref 0.0–0.5)
EOS PCT: 6 %
HEMATOCRIT: 43.2 % (ref 36.0–46.0)
Hemoglobin: 14 g/dL (ref 12.0–15.0)
Immature Granulocytes: 0 %
LYMPHS ABS: 2.7 10*3/uL (ref 0.7–4.0)
Lymphocytes Relative: 33 %
MCH: 28.1 pg (ref 26.0–34.0)
MCHC: 32.4 g/dL (ref 30.0–36.0)
MCV: 86.7 fL (ref 80.0–100.0)
Monocytes Absolute: 0.7 10*3/uL (ref 0.1–1.0)
Monocytes Relative: 8 %
NRBC: 0 % (ref 0.0–0.2)
Neutro Abs: 4.3 10*3/uL (ref 1.7–7.7)
Neutrophils Relative %: 53 %
Platelets: 159 10*3/uL (ref 150–400)
RBC: 4.98 MIL/uL (ref 3.87–5.11)
RDW: 13 % (ref 11.5–15.5)
WBC: 8.2 10*3/uL (ref 4.0–10.5)

## 2017-12-03 NOTE — Progress Notes (Signed)
Hematology/Oncology Follow up visit [facility] [phone] [phone] Patient Care Team: Birdie Sons, MD as PCP - General (Family Medicine)  REASON FOR VISIT Follow up for treatment of thrombocytopenia  PERTINENT HEMATOLOGY HISTORY Julia Garner 67 y.o.  female who has a history of recurrent ITP. Before she was referred to me, she has a long history of ITP which were treated with sterids by primary care physician and she used to respond to steroids. She was seen by anther Hematologist Dr.Pandit many years ago. Dr.Pandit has left [place].  She had labs doneon 08/22/2016. Platelet counts were slow at 15,000. She was treated with a short course (5 days) of Steroids by Dr.Fisher and platelet recovered to 120,000, however later dropped again to 11,000 on 09/10/2016.  I treated her with a longer course of prednisone with slow tapering and her counts again responded to treatment. During the tapering the course,she visited home country [place] and had blood work done there after she finished the tapering course of Prednisone there. She reports the platelet counts were normal in [place].   Patient has had lab work up including negative hepatitis, HIV, normal LDH, normal B12 and folate level. ANA was positive and she was evaluated by rheumatologist. Based on my phone discussion with Rheumatologist, patient has had rheumatology work up and was not considered to have any autoimmune problems. She does not have hepatosplenomegaly.   Patient returned to clinic in December and repeat blood work on 12/26/2016 showed platelet count of 16,000. Treated with a course of Dexamethasone 63m x 4 days, platelet again responded and dropped again.  And she reports easy bruising, feeling tired.  Peripheral blood flowcytometry 1% of analyzed cells containing clonal B cell population which are CD5- and CD 10-.  There is mildly increased eosinophils 7%.   Patient does  not have any palpable lymphadenopathy on physical examination and we obtained CT which showed no pathological lymphadenopathy. Bone marrow biopsy showed hypercellular marrow with trilineage hematopoiesis including increased megakaryocytes. No evidence of lymphoproliferative process involvement. Normal cytogenetics.  At this point patient has had extensive work up for her thrombocytopenia, and we discussed in lengthy that she most likely have ITP, there maybe a low grade lymphoma too. Given that she has recurrent ITP and platelet counts cannot be maintained without steroids, I suggest Rituximab weekly x 4 for consolidation. Patient has requested me to talk to her son in law Dr.Kamron who is a [place] in [place] I have talked to Dr.Kamron about the rationale of using Rituximab and also the side effects profile. Dr.Kamron agrees the plan and has relayed information to patient. I have also discussed the side effects of Rituximab with patient. She agrees with treatment.   INTERVAL HISTORY Julia Wintertonis a 67y.o. female who has above hematology history reviewed by me today presents for follow up visit for ITP.  S/p Rituximab weekly x 4.  Off steroids.  Doing well. Denies hematochezia, hematuria, hematemesis, epistaxis, black tarry stool or easy bruising.    ROS:  Review of Systems  Constitutional: Negative for appetite change, chills and fatigue.  HENT:   Negative for hearing loss, lump/mass and nosebleeds.   Eyes: Negative for eye problems and icterus.  Respiratory: Negative for chest tightness and cough.   Cardiovascular: Negative for chest pain and leg swelling.  Gastrointestinal: Negative for abdominal distention.  Genitourinary: Negative for difficulty urinating and dysuria.   Musculoskeletal: Negative for gait problem and neck pain.  Skin: Negative for itching.  Neurological: Negative for dizziness, gait problem and headaches.  Hematological: Negative for adenopathy. Does not  bruise/bleed easily.  Psychiatric/Behavioral: The patient is not nervous/anxious.     MEDICAL HISTORY:  Past Medical History:  Diagnosis Date  . Allergy   . Chronic ITP (idiopathic thrombocytopenia) (HCC)   . Depression   . Head injuries   . History of seizure 2007   one seizure   . Hypertension     SURGICAL HISTORY: Past Surgical History:  Procedure Laterality Date  . CESAREAN SECTION  1990  . CHOLECYSTECTOMY  2005  . CYST EXCISION  1996   Tracheal Cyst    SOCIAL HISTORY: Social History   Socioeconomic History  . Marital status: Married    Spouse name: Not on file  . Number of children: 3  . Years of education: Not on file  . Highest education level: Not on file  Occupational History  . Occupation: retired    Comment: Community education officer  Social Needs  . Financial resource strain: Not on file  . Food insecurity:    Worry: Not on file    Inability: Not on file  . Transportation needs:    Medical: Not on file    Non-medical: Not on file  Tobacco Use  . Smoking status: Never Smoker  . Smokeless tobacco: Never Used  Substance and Sexual Activity  . Alcohol use: No    Alcohol/week: 0.0 standard drinks  . Drug use: No  . Sexual activity: Not on file  Lifestyle  . Physical activity:    Days per week: Not on file    Minutes per session: Not on file  . Stress: Not on file  Relationships  . Social connections:    Talks on phone: Not on file    Gets together: Not on file    Attends religious service: Not on file    Active member of club or organization: Not on file    Attends meetings of clubs or organizations: Not on file    Relationship status: Not on file  . Intimate partner violence:    Fear of current or ex partner: Not on file    Emotionally abused: Not on file    Physically abused: Not on file    Forced sexual activity: Not on file  Other Topics Concern  . Not on file  Social History Narrative  . Not on file    FAMILY HISTORY: Family History    Problem Relation Age of Onset  . Heart attack Father        1st MI at age 28    ALLERGIES:  is allergic to penicillins; simcor  [niacin-simvastatin er]; and tylenol  [acetaminophen].  MEDICATIONS:  Current Outpatient Medications  Medication Sig Dispense Refill  . b complex vitamins capsule Take 1 capsule by mouth daily.    Marland Kitchen Bioflavonoid Products (VITAMIN C PLUS) 500 MG TABS Take 2 tablets by mouth daily.     . Biotin 1000 MCG tablet Take 1,000 mcg by mouth daily.    . Calcium Carbonate-Vit D-Min (CALCIUM 1200 PO) Take by mouth.    Marland Kitchen glucosamine-chondroitin 500-400 MG tablet Take 1 tablet by mouth 3 (three) times daily.    . irbesartan-hydrochlorothiazide (AVALIDE) 150-12.5 MG tablet Take 1 tablet by mouth daily. 90 tablet 4  . loratadine-pseudoephedrine (CLARITIN-D 24-HOUR) 10-240 MG 24 hr tablet TAKE 1 TABLET BY MOUTH ONCE DAILY AS NEEDED FOR ALLERGIES. 90 tablet 3  . Naproxen Sodium (ALEVE) 220 MG CAPS Take by mouth daily  as needed.    . Omega 3 1200 MG CAPS Take 2 capsules by mouth daily.    . prednisoLONE acetate (PRED FORTE) 1 % ophthalmic suspension INSTILL 1 DROP INTO RIGHT EYE 4 TIMES DAILY  0  . Pseudoephedrine-Guaifenesin (MUCINEX D PO) Take by mouth.    . sertraline (ZOLOFT) 50 MG tablet TAKE 1 AND 1/2 TABLETS BY MOUTH EVERY MORNING 135 tablet 4  . simvastatin (ZOCOR) 40 MG tablet Take 1 tablet (40 mg total) by mouth at bedtime. 90 tablet 4  . traZODone (DESYREL) 50 MG tablet TAKE ONE OR TWO TABLETS AT BEDTIME 180 tablet 4  . albuterol (PROVENTIL HFA;VENTOLIN HFA) 108 (90 Base) MCG/ACT inhaler Inhale 2 puffs into the lungs every 6 (six) hours as needed for wheezing or shortness of breath. (Patient not taking: Reported on 12/03/2017) 1 Inhaler 0   Current Facility-Administered Medications  Medication Dose Route Frequency Provider Last Rate Last Dose  . methylPREDNISolone acetate (DEPO-MEDROL) injection 80 mg  80 mg Intra-articular Once Birdie Sons, MD           .  PHYSICAL EXAMINATION: ECOG PERFORMANCE STATUS: 0 - Asymptomatic Vitals:   12/03/17 1033  BP: 118/80  Pulse: 85  Resp: 18  Temp: 99 F (37.2 C)   Filed Weights   12/03/17 1033  Weight: 168 lb 1.6 oz (76.2 kg)  Physical Exam  Constitutional: She is oriented to person, place, and time. No distress.  HENT:  Head: Normocephalic and atraumatic.  Nose: Nose normal.  Mouth/Throat: Oropharynx is clear and moist. No oropharyngeal exudate.  Eyes: Pupils are equal, round, and reactive to light. Conjunctivae and EOM are normal. Left eye exhibits no discharge. No scleral icterus.  Neck: Normal range of motion. Neck supple. No JVD present.  Cardiovascular: Normal rate, regular rhythm and normal heart sounds.  No murmur heard. Pulmonary/Chest: Effort normal and breath sounds normal. No respiratory distress. She has no wheezes. She has no rales. She exhibits no tenderness.  Abdominal: Soft. Bowel sounds are normal. She exhibits no distension and no mass. There is no tenderness. There is no rebound.  Musculoskeletal: Normal range of motion. She exhibits no edema or tenderness.  Lymphadenopathy:    She has no cervical adenopathy.  Neurological: She is alert and oriented to person, place, and time. No cranial nerve deficit. She exhibits normal muscle tone. Coordination normal.  Skin: Skin is warm and dry. No rash noted. She is not diaphoretic. No erythema.  Psychiatric: Memory, affect and judgment normal.         LABORATORY DATA:  I have reviewed the data as listed Lab Results  Component Value Date   WBC 8.2 12/03/2017   HGB 14.0 12/03/2017   HCT 43.2 12/03/2017   MCV 86.7 12/03/2017   PLT 159 12/03/2017   Recent Labs    12/26/16 1129 09/13/17 1446  NA 137 140  K 3.3* 3.5  CL 100* 100  CO2 28 25  GLUCOSE 118* 106*  BUN 17 12  CREATININE 0.72 0.71  CALCIUM 9.1 9.5  GFRNONAA >60 90  GFRAA >60 103  PROT 8.4* 7.2  ALBUMIN 3.7 3.9  AST 30 17  ALT 34 20  ALKPHOS 67 73   BILITOT 0.6 0.3     RADIOGRAPHIC STUDIES: I have personally reviewed the radiological images as listed and agreed with the findings in the report.   ultrasound of the abdomen showed  no spleenmegaly. Fatty liver disease.  01/17/2017 CT chest abdomen pelvis with contrast showed no  pathological adenopathy was identified, aortic atherosclerosis, three-vessel coronary artery atherosclerotic calcification. Cholecystectomy, small bladder cystocele due to pelvic floor laxity. Prominent stool throughout the colon favors constipation.   ASSESSMENT & PLAN:  1. Chronic ITP (idiopathic thrombocytopenia) (HCC)    # labs reviewed and discussed with patient.  Platelet count has completely normalized.  Recommend repeat cbc in 3 months,  Follow up in clinic in 6 months.   All questions were answered. The patient knows to call the clinic with any problems questions or concerns. Follow up in 6 months.  Total face to face encounter time for this patient visit was 15 min. >50% of the time was  spent in counseling and coordination of care.    Earlie Server, MD, PhD Hematology Oncology [facility] Pager- [phone] 12/03/2017

## 2017-12-03 NOTE — Progress Notes (Signed)
Patient here for follow up. No concerns voiced.  °

## 2017-12-25 ENCOUNTER — Encounter: Payer: Self-pay | Admitting: Family Medicine

## 2017-12-25 ENCOUNTER — Ambulatory Visit
Admission: RE | Admit: 2017-12-25 | Discharge: 2017-12-25 | Disposition: A | Discharge status: Home / Self Care | Payer: Medicare Other | Source: Ambulatory Visit | Attending: Family Medicine | Admitting: Family Medicine

## 2017-12-25 ENCOUNTER — Ambulatory Visit
Admission: RE | Admit: 2017-12-25 | Discharge: 2017-12-25 | Disposition: A | Discharge status: Home / Self Care | Payer: Medicare Other | Attending: Family Medicine | Admitting: Family Medicine

## 2017-12-25 ENCOUNTER — Ambulatory Visit: Payer: Medicare Other | Admitting: Family Medicine

## 2017-12-25 ENCOUNTER — Telehealth: Payer: Self-pay | Admitting: Family Medicine

## 2017-12-25 VITALS — BP 122/80 | HR 94 | Temp 98.5°F | Wt 168.0 lb

## 2017-12-25 DIAGNOSIS — R059 Cough, unspecified: Secondary | ICD-10-CM

## 2017-12-25 DIAGNOSIS — E119 Type 2 diabetes mellitus without complications: Secondary | ICD-10-CM

## 2017-12-25 DIAGNOSIS — R0602 Shortness of breath: Secondary | ICD-10-CM | POA: Diagnosis not present

## 2017-12-25 DIAGNOSIS — R05 Cough: Secondary | ICD-10-CM

## 2017-12-25 LAB — POCT GLYCOSYLATED HEMOGLOBIN (HGB A1C)
ESTIMATED AVERAGE GLUCOSE: 148
HEMOGLOBIN A1C: 6.8 % — AB (ref 4.0–5.6)

## 2017-12-25 MED ORDER — PREDNISONE 10 MG PO TABS: ORAL_TABLET | ORAL | 0 refills | Status: DC

## 2017-12-25 MED ORDER — DOXYCYCLINE HYCLATE 100 MG PO TABS: 100.0000 mg | ORAL_TABLET | Freq: Two times a day (BID) | ORAL | 0 refills | Status: DC

## 2017-12-25 NOTE — Telephone Encounter (Signed)
Pt advised.   Thanks,   -Keiron Iodice  

## 2017-12-25 NOTE — Telephone Encounter (Signed)
No sign of pneumonia on xr. Likely just has some bronchitis. Go ahead and start prednisone, have sent prescription [facility]

## 2017-12-25 NOTE — Patient Instructions (Signed)
Go to the [facility] on [address] for chest Xray  

## 2017-12-25 NOTE — Progress Notes (Signed)
Patient: Julia Garner Female    DOB: Apr 04, 1950   67 y.o.   MRN: [id number] Visit Date: 12/25/2017  Today's Provider: Mila Merry, MD   Chief Complaint  Patient presents with  . Cough   Subjective:    Cough  This is a new problem. Episode onset: 1 week ago. The problem has been unchanged. The cough is non-productive. Associated symptoms include ear pain, headaches, myalgias, postnasal drip and wheezing. Pertinent negatives include no chest pain, chills, fever, hemoptysis, rhinorrhea, sore throat or shortness of breath. Treatments tried: Z pack, OTC Cough syrup, cough drops and Ventolin inhaler. The treatment provided no relief.  Patient states her friend who is a Librarian, academic, prescribed her a Z pack last week while she was in [place]. Patient only has 1 dose left of the Z pack, and still doesn't feel any better. Has had progressive worsening cough.    Prediabetes, Follow-up:   Lab Results  Component Value Date   HGBA1C 6.5 (H) 09/13/2017   GLUCOSE 106 (H) 09/13/2017   GLUCOSE 118 (H) 12/26/2016   GLUCOSE 127 (H) 10/16/2016    Last seen for for this4 months ago.  Management since that visit includes working on losing weight with diet and exercise.  Weight trend: stable Prior visit with dietician: no Current diet: in general, a "healthy" diet     Pertinent Labs:    Component Value Date/Time   CHOL 120 09/13/2017 1446   TRIG 247 (H) 09/13/2017 1446   CHOLHDL 3.9 09/13/2017 1446   CREATININE 0.71 09/13/2017 1446    Wt Readings from Last 3 Encounters:  12/25/17 168 lb (76.2 kg)  12/03/17 168 lb 1.6 oz (76.2 kg)  09/13/17 169 lb (76.7 kg)       Allergies  Allergen Reactions  . Penicillins Itching    Hot flashes.  . Simcor  [Niacin-Simvastatin Er]     Flushing  . Tylenol  [Acetaminophen]      Current Outpatient Medications:  .  albuterol (PROVENTIL HFA;VENTOLIN HFA) 108 (90 Base) MCG/ACT inhaler, Inhale 2 puffs into the lungs every 6 (six) hours as  needed for wheezing or shortness of breath., Disp: 1 Inhaler, Rfl: 0 .  b complex vitamins capsule, Take 1 capsule by mouth daily., Disp: , Rfl:  .  Bioflavonoid Products (VITAMIN C PLUS) 500 MG TABS, Take 2 tablets by mouth daily. , Disp: , Rfl:  .  Biotin 1000 MCG tablet, Take 1,000 mcg by mouth daily., Disp: , Rfl:  .  Calcium Carbonate-Vit D-Min (CALCIUM 1200 PO), Take by mouth., Disp: , Rfl:  .  glucosamine-chondroitin 500-400 MG tablet, Take 1 tablet by mouth 3 (three) times daily., Disp: , Rfl:  .  irbesartan-hydrochlorothiazide (AVALIDE) 150-12.5 MG tablet, Take 1 tablet by mouth daily., Disp: 90 tablet, Rfl: 4 .  loratadine-pseudoephedrine (CLARITIN-D 24-HOUR) 10-240 MG 24 hr tablet, TAKE 1 TABLET BY MOUTH ONCE DAILY AS NEEDED FOR ALLERGIES., Disp: 90 tablet, Rfl: 3 .  Naproxen Sodium (ALEVE) 220 MG CAPS, Take by mouth daily as needed., Disp: , Rfl:  .  Omega 3 1200 MG CAPS, Take 2 capsules by mouth daily., Disp: , Rfl:  .  prednisoLONE acetate (PRED FORTE) 1 % ophthalmic suspension, INSTILL 1 DROP INTO RIGHT EYE 4 TIMES DAILY, Disp: , Rfl: 0 .  sertraline (ZOLOFT) 50 MG tablet, TAKE 1 AND 1/2 TABLETS BY MOUTH EVERY MORNING, Disp: 135 tablet, Rfl: 4 .  simvastatin (ZOCOR) 40 MG tablet, Take 1 tablet (40 mg total)  by mouth at bedtime., Disp: 90 tablet, Rfl: 4 .  traZODone (DESYREL) 50 MG tablet, TAKE ONE OR TWO TABLETS AT BEDTIME, Disp: 180 tablet, Rfl: 4 .  Pseudoephedrine-Guaifenesin (MUCINEX D PO), Take by mouth., Disp: , Rfl:   Review of Systems  Constitutional: Positive for fatigue. Negative for appetite change, chills, diaphoresis and fever.  HENT: Positive for congestion, ear pain and postnasal drip. Negative for ear discharge, rhinorrhea, sinus pressure, sinus pain, sneezing and sore throat.   Eyes: Positive for discharge. Negative for itching.  Respiratory: Positive for cough (non- productive) and wheezing. Negative for hemoptysis, chest tightness and shortness of breath.     Cardiovascular: Negative for chest pain and palpitations.  Gastrointestinal: Negative for abdominal pain, nausea and vomiting.  Musculoskeletal: Positive for myalgias.  Neurological: Positive for headaches. Negative for dizziness and weakness.    Social History   Tobacco Use  . Smoking status: Never Smoker  . Smokeless tobacco: Never Used  Substance Use Topics  . Alcohol use: No    Alcohol/week: 0.0 standard drinks   Objective:   BP 122/80 (BP Location: Left Arm, Patient Position: Sitting, Cuff Size: Large)   Pulse 94   Temp 98.5 F (36.9 C) (Oral)   Wt 168 lb (76.2 kg)   SpO2 95% Comment: room air  BMI 31.23 kg/m  Vitals:   12/25/17 1044  BP: 122/80  Pulse: 94  Temp: 98.5 F (36.9 C)  TempSrc: Oral  SpO2: 95%  Weight: 168 lb (76.2 kg)     Physical Exam  General Appearance:    Alert, cooperative, no distress  HENT:   ENT exam normal, no neck nodes or sinus tenderness  Eyes:    PERRL, conjunctiva/corneas clear, EOM's intact       Lungs:     Clear to auscultation bilaterally, respirations unlabored. Poor inspiratory effort.   Heart:    Regular rate and rhythm      Results for orders placed or performed in visit on 12/25/17  POCT HgB A1C  Result Value Ref Range   Hemoglobin A1C 6.8 (A) 4.0 - 5.6 %   HbA1c POC (<> result, manual entry)     HbA1c, POC (prediabetic range)     HbA1c, POC (controlled diabetic range)     Est. average glucose Bld gHb Est-mCnc 148       Assessment & Plan:     1. Type 2 diabetes mellitus without complication, without long-term current use of insulin (HCC) Newly diagnosed.  - POCT HgB A1C - Amb ref to Medical Nutrition Therapy-MNT  2. Cough  - DG Chest 2 View; Future - predniSONE (DELTASONE) 10 MG tablet; 6 tablets for 2 days, then 5 for 2 days, then 4 for 2 days, then 3 for 2 days, then 2 for 2 days, then 1 for 2 days.  Dispense: 42 tablet; Refill: 0 - doxycycline (VIBRA-TABS) 100 MG tablet; Take 1 tablet (100 mg total) by  mouth 2 (two) times daily.  Dispense: 20 tablet; Refill: 0       Mila Merryonald Rosco Harriott, MD  [facility]

## 2017-12-26 ENCOUNTER — Ambulatory Visit: Payer: Self-pay | Admitting: Family Medicine

## 2018-01-01 DIAGNOSIS — Z961 Presence of intraocular lens: Secondary | ICD-10-CM | POA: Diagnosis not present

## 2018-01-01 DIAGNOSIS — H5213 Myopia, bilateral: Secondary | ICD-10-CM | POA: Diagnosis not present

## 2018-01-01 DIAGNOSIS — H26492 Other secondary cataract, left eye: Secondary | ICD-10-CM | POA: Diagnosis not present

## 2018-01-17 ENCOUNTER — Telehealth: Payer: Self-pay | Admitting: Family Medicine

## 2018-01-17 DIAGNOSIS — R059 Cough, unspecified: Secondary | ICD-10-CM

## 2018-01-17 DIAGNOSIS — R05 Cough: Secondary | ICD-10-CM

## 2018-01-17 MED ORDER — DOXYCYCLINE HYCLATE 100 MG PO TABS: 100.0000 mg | ORAL_TABLET | Freq: Two times a day (BID) | ORAL | 0 refills | Status: DC

## 2018-01-17 MED ORDER — PREDNISONE 10 MG PO TABS: ORAL_TABLET | ORAL | 0 refills | Status: DC

## 2018-01-17 MED ORDER — PREDNISONE 10 MG PO TABS: ORAL_TABLET | ORAL | 0 refills | Status: AC

## 2018-01-17 NOTE — Telephone Encounter (Signed)
Pt was here on 12/25/2017 for a cough.  She was prescribed prednisone and doxycycline.  She states she finished both prescriptions and was feeling better until about three days ago.  She states her cough and wheezing is back.  She denies fevers, chills.  She has tried her inhaler but it has not helped.    Please advise.  Thanks,   -Vernona Rieger

## 2018-01-17 NOTE — Telephone Encounter (Signed)
Have sent refills for both to [facility]. O.v. if not better next week. Er if any fevers, shortness of breath, or chest pains.

## 2018-01-17 NOTE — Telephone Encounter (Signed)
Pt advised as directed below.   Thanks,   -Haydn Cush  

## 2018-01-17 NOTE — Addendum Note (Signed)
Addended by: Malva Limes on: 01/17/2018 02:50 PM   Modules accepted: Orders

## 2018-01-17 NOTE — Telephone Encounter (Signed)
Pt is now having the same coughing symptoms again.  Asking what Dr. Sherrie Mustache can suggest or call in for her?  Please advise.  Thanks, [employer]

## 2018-02-14 ENCOUNTER — Encounter: Payer: Self-pay | Admitting: Family Medicine

## 2018-02-14 ENCOUNTER — Ambulatory Visit (INDEPENDENT_AMBULATORY_CARE_PROVIDER_SITE_OTHER): Payer: Medicare Other | Admitting: Family Medicine

## 2018-02-14 VITALS — BP 120/72 | HR 78 | Temp 97.7°F | Resp 16 | Wt 170.0 lb

## 2018-02-14 DIAGNOSIS — L309 Dermatitis, unspecified: Secondary | ICD-10-CM

## 2018-02-14 MED ORDER — PREDNISONE 10 MG PO TABS: ORAL_TABLET | ORAL | 0 refills | Status: AC

## 2018-02-14 NOTE — Patient Instructions (Signed)
.   Please review the attached list of medications and notify my office if there are any errors.   . Please bring all of your medications to every appointment so we can make sure that our medication list is the same as yours.   

## 2018-02-14 NOTE — Progress Notes (Signed)
Marland Kitchenassess  Patient: Julia Garner Female    DOB: 07-07-1950   68 y.o.   MRN: [id number] Visit Date: 02/14/2018  Today's Provider: Mila Merry, MD   Chief Complaint  Patient presents with  . Rash    x 2 weeks   Subjective:     Rash  This is a new problem. Episode onset: 2 weeks ago. The problem has been gradually worsening since onset. The affected locations include the torso, back, neck, chest, head, scalp, left ear, left axilla, left arm, left elbow, left hip, left lower leg, right arm, right axilla, right elbow and right hip. The rash is characterized by redness and itchiness. Pertinent negatives include no fatigue, fever, shortness of breath or vomiting. Treatments tried: Benadryl, Cortisone-10, and Vaseline. The treatment provided mild relief.    Allergies  Allergen Reactions  . Penicillins Itching    Hot flashes.  . Simcor  [Niacin-Simvastatin Er]     Flushing  . Tylenol  [Acetaminophen]      Current Outpatient Medications:  .  albuterol (PROVENTIL HFA;VENTOLIN HFA) 108 (90 Base) MCG/ACT inhaler, Inhale 2 puffs into the lungs every 6 (six) hours as needed for wheezing or shortness of breath., Disp: 1 Inhaler, Rfl: 0 .  b complex vitamins capsule, Take 1 capsule by mouth daily., Disp: , Rfl:  .  Bioflavonoid Products (VITAMIN C PLUS) 500 MG TABS, Take 2 tablets by mouth daily. , Disp: , Rfl:  .  Biotin 1000 MCG tablet, Take 1,000 mcg by mouth daily., Disp: , Rfl:  .  Calcium Carbonate-Vit D-Min (CALCIUM 1200 PO), Take by mouth., Disp: , Rfl:  .  glucosamine-chondroitin 500-400 MG tablet, Take 1 tablet by mouth 3 (three) times daily., Disp: , Rfl:  .  irbesartan-hydrochlorothiazide (AVALIDE) 150-12.5 MG tablet, Take 1 tablet by mouth daily., Disp: 90 tablet, Rfl: 4 .  loratadine-pseudoephedrine (CLARITIN-D 24-HOUR) 10-240 MG 24 hr tablet, TAKE 1 TABLET BY MOUTH ONCE DAILY AS NEEDED FOR ALLERGIES., Disp: 90 tablet, Rfl: 3 .  Omega 3 1200 MG CAPS, Take 2 capsules by  mouth daily., Disp: , Rfl:  .  sertraline (ZOLOFT) 50 MG tablet, TAKE 1 AND 1/2 TABLETS BY MOUTH EVERY MORNING, Disp: 135 tablet, Rfl: 4 .  simvastatin (ZOCOR) 40 MG tablet, Take 1 tablet (40 mg total) by mouth at bedtime., Disp: 90 tablet, Rfl: 4 .  traZODone (DESYREL) 50 MG tablet, TAKE ONE OR TWO TABLETS AT BEDTIME, Disp: 180 tablet, Rfl: 4 .  Naproxen Sodium (ALEVE) 220 MG CAPS, Take by mouth daily as needed., Disp: , Rfl:   Review of Systems  Constitutional: Negative for appetite change, chills, fatigue and fever.  Respiratory: Negative for chest tightness and shortness of breath.   Cardiovascular: Negative for chest pain and palpitations.  Gastrointestinal: Negative for abdominal pain, nausea and vomiting.  Skin: Positive for rash.  Neurological: Negative for dizziness and weakness.    Social History   Tobacco Use  . Smoking status: Never Smoker  . Smokeless tobacco: Never Used  Substance Use Topics  . Alcohol use: No    Alcohol/week: 0.0 standard drinks      Objective:   BP 120/72 (BP Location: Left Arm, Patient Position: Sitting, Cuff Size: Large)   Pulse 78   Temp 97.7 F (36.5 C) (Oral)   Resp 16   Wt 170 lb (77.1 kg)   SpO2 97% Comment: room air  BMI 31.60 kg/m  Vitals:   02/14/18 1040  BP: 120/72  Pulse: 78  Resp: 16  Temp: 97.7 F (36.5 C)  TempSrc: Oral  SpO2: 97%  Weight: 170 lb (77.1 kg)      Physical Exam  General appearance: alert, well developed, well nourished, cooperative and in no distress Head: Normocephalic, without obvious abnormality, atraumatic Respiratory: Respirations even and unlabored, normal respiratory rate Extremities: Scattered patches of dry flaky erythema neck trunk and proximal extremities.     Assessment & Plan    1. Dermatitis Appears to be allergic start- predniSONE (DELTASONE) 10 MG tablet; 6 tablets for 1 day, then 5 for 1 day, then 4 for 1 day, then 3 for 1 day, then 2 for 1 day then 1 for 1 day.  Dispense: 21  tablet; Refill: 0  She already has appointment schedule with dermatology.     Mila Merryonald Jozee Hammer, MD  [facility]

## 2018-02-19 DIAGNOSIS — R21 Rash and other nonspecific skin eruption: Secondary | ICD-10-CM | POA: Diagnosis not present

## 2018-02-19 DIAGNOSIS — L80 Vitiligo: Secondary | ICD-10-CM | POA: Diagnosis not present

## 2018-02-19 DIAGNOSIS — T7840XA Allergy, unspecified, initial encounter: Secondary | ICD-10-CM | POA: Diagnosis not present

## 2018-02-26 ENCOUNTER — Telehealth: Payer: Self-pay

## 2018-02-26 NOTE — Telephone Encounter (Signed)
Spoke to patient regarding her upcoming lab appt next week to check platelet count. She states that she has taking prednisone every month ever since sheeing Dr. Cathie Hoops. She has taken steriods due to having respiatory infections in December and January. She feels that due to that, her platelet count will not be accurate and that is why she cancelled lab appointment. I told her that we can rescchedule lab for a later date, but she states that she will be going out of the country and she will call back to reschedule missed lab appt. Pt aware that she has MD appt in May.

## 2018-03-04 ENCOUNTER — Other Ambulatory Visit: Payer: Medicare Other

## 2018-03-05 DIAGNOSIS — R21 Rash and other nonspecific skin eruption: Secondary | ICD-10-CM | POA: Diagnosis not present

## 2018-03-21 ENCOUNTER — Other Ambulatory Visit: Payer: Self-pay | Admitting: Family Medicine

## 2018-03-31 ENCOUNTER — Telehealth: Payer: Self-pay | Admitting: *Deleted

## 2018-03-31 ENCOUNTER — Encounter: Payer: Self-pay | Admitting: *Deleted

## 2018-03-31 NOTE — Telephone Encounter (Signed)
Attempted to call pt, no answer, no vm.  Sent MyChart message notifying her that per dr Cathie Hoops no note will be required by the airline.

## 2018-03-31 NOTE — Telephone Encounter (Signed)
Patient called requesting a letter from Dr. Cathie Hoops stating that she should not travel at this time. She is supposed to fly to [place] tomorrow, but she is afraid to do so at this time, and needs a letter to give the airline. Please advise. [phone]

## 2018-04-02 DIAGNOSIS — L219 Seborrheic dermatitis, unspecified: Secondary | ICD-10-CM | POA: Diagnosis not present

## 2018-04-02 DIAGNOSIS — L578 Other skin changes due to chronic exposure to nonionizing radiation: Secondary | ICD-10-CM | POA: Diagnosis not present

## 2018-04-02 DIAGNOSIS — L300 Nummular dermatitis: Secondary | ICD-10-CM | POA: Diagnosis not present

## 2018-05-05 DIAGNOSIS — L300 Nummular dermatitis: Secondary | ICD-10-CM | POA: Diagnosis not present

## 2018-05-05 DIAGNOSIS — L219 Seborrheic dermatitis, unspecified: Secondary | ICD-10-CM | POA: Diagnosis not present

## 2018-05-20 ENCOUNTER — Other Ambulatory Visit: Payer: Self-pay | Admitting: Family Medicine

## 2018-06-03 ENCOUNTER — Other Ambulatory Visit: Payer: Medicare Other

## 2018-06-03 ENCOUNTER — Ambulatory Visit: Payer: Medicare Other | Admitting: Oncology

## 2018-07-03 ENCOUNTER — Other Ambulatory Visit: Payer: Self-pay

## 2018-07-03 ENCOUNTER — Encounter: Payer: Self-pay | Admitting: Allergy & Immunology

## 2018-07-03 ENCOUNTER — Ambulatory Visit: Payer: Medicare Other | Admitting: Allergy & Immunology

## 2018-07-03 VITALS — BP 126/70 | HR 80 | Temp 98.2°F | Resp 18 | Ht 61.0 in | Wt 168.0 lb

## 2018-07-03 DIAGNOSIS — B999 Unspecified infectious disease: Secondary | ICD-10-CM

## 2018-07-03 DIAGNOSIS — L508 Other urticaria: Secondary | ICD-10-CM

## 2018-07-03 NOTE — Patient Instructions (Addendum)
1. Chronic urticaria - Your history does not have any "red flags" such as fevers, joint pains, or permanent skin changes that would be concerning for a more serious cause of hives.  - We are going to get your Dermatology records to see what the biopsy showed.  - We will get some labs to rule out serious causes of hives: alpha gal panel, complete blood count, tryptase level, chronic urticaria panel, CMP, ESR, and CRP. - We will get labs to look at the most common causes of food allergies, including alpha gal sensitivity. - We are going to get an environmental allergy panel to look for indoor and outdoor environmental allergies.  - Chronic hives are often times a self limited process and will "burn themselves out" over 6-12 months, although this is not always the case.  - In the meantime, start suppressive dosing of antihistamines:   - Morning: Allegra (fexofenadine) 359m (two tablets)  - Evening: Zyrtec (cetirizine) 242m(two tablets)  - If the above is not working, try adding: Pepcid (famotidine) 201m You can change this dosing at home, decreasing the dose as needed or increasing the dosing as needed.  - If you are not tolerating the medications or are tired of taking them every day, we can start treatment with a monthly injectable medication called Xolair.   2. Recurrent infections - We will obtain some screening labs to evaluate your immune system.  - Labs to evaluate the quantitative ([facility]pects of your immune system: IgG/IgA/IgM, CBC with differential - Labs to evaluate the qualitative (HOWTimberwood Parkspects of your immune system: CH50, Pneumococcal titers, Tetanus titers, Diphtheria titers - We may consider immunizations with Pneumovax and Tdap to challenge your immune system, and then obtain repeat titers in 4-6 weeks.   3. Return in about 4 weeks (around 07/31/2018). This can be an in-person, a virtual Webex or a telephone follow up visit.   Please inform us [place] any  Emergency Department visits, hospitalizations, or changes in symptoms. Call us [place] going to the ED for breathing or allergy symptoms since we might be able to fit you in for a sick visit. Feel free to contact us [place] with any questions, problems, or concerns.  It was a pleasure to meet you today!  Websites that have reliable patient information: 1. American Academy of Asthma, Allergy, and Immunology: www.aaaai.org 2. Food Allergy Research and Education (FARE): foodallergy.org 3. Mothers of Asthmatics: http://www.asthmacommunitynetwork.org 4. American College of Allergy, Asthma, and Immunology: www.acaai.org  "Like" us [place] Facebook and Instagram for our latest updates!      Make sure you are registered to vote! If you have moved or changed any of your contact information, you will need to get this updated before voting!    Voter ID laws are NOT going into effect for the General Election in November 2020! DO NOT let this stop you from exercising your right to vote!   Absentee voting is the SAFEST way to vote during the coronavirus pandemic! Download and print an absentee ballot request form at https://[email or web address]  More information on absentee ballots can be found here: [email or web address].[email or web address]/[email or web address]-[email or web address]

## 2018-07-03 NOTE — Progress Notes (Signed)
NEW PATIENT  Date of Service/Encounter:  07/03/18  Referring provider: Birdie Sons, MD   Assessment:   Chronic urticaria  Recurrent infections - mostly sinopulmonary in nature  Chronic ITP  Plan/Recommendations:   1. Chronic urticaria - Your history does not have any "red flags" such as fevers, joint pains, or permanent skin changes that would be concerning for a more serious cause of hives.  - We are going to get your Dermatology records to see what the biopsy showed.  - We will get some labs to rule out serious causes of hives: alpha gal panel, complete blood count, tryptase level, chronic urticaria panel, CMP, ESR, and CRP. - We will get labs to look at the most common causes of food allergies, including alpha gal sensitivity. - We are going to get an environmental allergy panel to look for indoor and outdoor environmental allergies.  - Chronic hives are often times a self limited process and will "burn themselves out" over 6-12 months, although this is not always the case.  - In the meantime, start suppressive dosing of antihistamines:   - Morning: Allegra (fexofenadine) 324m (two tablets)  - Evening: Zyrtec (cetirizine) 262m(two tablets)  - If the above is not working, try adding: Pepcid (famotidine) 2028m You can change this dosing at home, decreasing the dose as needed or increasing the dosing as needed.  - If you are not tolerating the medications or are tired of taking them every day, we can start treatment with a monthly injectable medication called Xolair.   2. Recurrent infections - We will obtain some screening labs to evaluate your immune system.  - Labs to evaluate the quantitative ([facility]s of your immune system: IgG/IgA/IgM, CBC with differential - Labs to evaluate the qualitative (HOWPort Jeffersonspects of your immune system: CH50, Pneumococcal titers, Tetanus titers, Diphtheria titers - We may consider immunizations with Pneumovax and  Tdap to challenge your immune system, and then obtain repeat titers in 4-6 weeks.   3. Return in about 4 weeks (around 07/31/2018). This can be an in-person, a virtual Webex or a telephone follow up visit.  Subjective:   Julia Garner a 67 98o. female presenting today for evaluation of  Chief Complaint  Patient presents with  . Rash    ongoing since January. She has seen 2 different dermatologists. Steroid injections temporarily relieved symptoms but the rash returned quickly.     Julia Degrootes a history of the following: Patient Active Problem List   Diagnosis Date Noted  . Diabetes mellitus without complication (HCCAddieville9/30/07/6224 Acute ITP (HCCStraff[place]9/02/2016  . Allergic rhinitis 06/29/2014  . Insomnia 06/29/2014  . Arthralgia of temporomandibular joint 06/29/2014  . Edema 06/29/2014  . Oral aphthae 07/08/2007  . 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 Arthropathy of hand 01/25/2007  . Fam hx-ischem heart disease 01/21/2007  . Menopausal and postmenopausal disorder 10/08/2005  . Tachycardia 10/08/2005  . Thrombocytopenia (HCCLuther9/24/2007  . Obstructive sleep apnea of adult 10/24/2004  . Essential (primary) hypertension 01/16/2004  . Depression 01/15/2002    History obtained from: chart review and patient.  Julia Pilons referred by FisBirdie SonsD.     QurIrving a 67 28o. female presenting for an evaluation of chronic hives. She has had hives since January 2020. She has had hives over her entire body, including her thorax as well as her arms and legs. She denies new exposures. It does not seem to  be associated with any particular food. She has noted no other symptoms with the rash, including shortness of breath, anxiety, abdominal pain, diarrhea, or other symptoms of anaphylaxis. The rash does not ever become vesicular or crusty. She denies any oozing. She has never needed to be treated with an antibiotic at all.   She has been treated for scabies  without improvement in her symptoms. She has been on prednisone which did provide relief to her rash. She has been on clobetasol as well as triamcinolone cream, which did provide some relief as well. She has been treating it with Aveeno Eczema as well, which does help with moisturizing. She as been on Allegra BID without resolution of her urticaria. She has been evaluated by Dr. Alfonso Patten at [facility]. She did have a biopsy performed there, but she tells me that this was "inconclusive". She was also seen by [facility] as well, but she dd not find them very helpful at all.   She does have some environmental allergies, but she just uses Claritin-D as needed. She has never been allergy tested in the past. She has no history of asthma at all.   Interestingly, she has a history of ITP which has required the use of recurrent courses of prednisone. She has had this problem for a number of years. At one point, it did go down to 22K. She was treated with dexamethasone at one point. She also has a history of multiple antibiotic courses. She has a history of upper respiratory infections, but denies any other infections including serious bacterial illnesses. She has never needed any IV antibiotics or hospitalizations for infections.   Otherwise, there is no history of other atopic diseases, including asthma, drug allergies, stinging insect allergies, eczema, urticaria or contact dermatitis. There is no significant infectious history. Vaccinations are up to date.    Past Medical History: Patient Active Problem List   Diagnosis Date Noted  . Diabetes mellitus without complication ([place]) 69/45/0388  . Acute ITP ([place]) 09/16/2016  . Allergic rhinitis 06/29/2014  . Insomnia 06/29/2014  . Arthralgia of temporomandibular joint 06/29/2014  . Edema 06/29/2014  . Oral aphthae 07/08/2007  . 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 Arthropathy of hand 01/25/2007  . Fam hx-ischem heart disease  01/21/2007  . Menopausal and postmenopausal disorder 10/08/2005  . Tachycardia 10/08/2005  . Thrombocytopenia ([place]) 10/08/2005  . Obstructive sleep apnea of adult 10/24/2004  . Essential (primary) hypertension 01/16/2004  . Depression 01/15/2002    Medication List:  Allergies as of 07/03/2018      Reactions   Niacin And Related    As component of simcor (flushing)   Penicillins Itching   Hot flashes.   Tylenol  [acetaminophen]       Medication List       Accurate as of July 03, 2018  8:35 PM. If you have any questions, ask your nurse or doctor.        STOP taking these medications   albuterol 108 (90 Base) MCG/ACT inhaler Commonly known as: VENTOLIN HFA Stopped by: Valentina Shaggy, MD   loratadine-pseudoephedrine 10-240 MG 24 hr tablet Commonly known as: CLARITIN-D 24-hour Stopped by: Valentina Shaggy, MD     TAKE these medications   Aleve 220 MG Caps Generic drug: Naproxen Sodium Take by mouth daily as needed.   b complex vitamins capsule Take 1 capsule by mouth daily.   Biotin 1000 MCG tablet Take 1,000 mcg by mouth daily.   CALCIUM 1200 PO  Take by mouth.   clobetasol 0.05 % external solution Commonly known as: TEMOVATE Apply 1 application topically 2 (two) times daily.   Flaxseed Oil 1000 MG Caps Take by mouth.   glucosamine-chondroitin 500-400 MG tablet Take 1 tablet by mouth 3 (three) times daily.   irbesartan-hydrochlorothiazide 150-12.5 MG tablet Commonly known as: AVALIDE Take 1 tablet by mouth daily.   multivitamin tablet Take 1 tablet by mouth daily.   Omega 3 1200 MG Caps Take 2 capsules by mouth daily.   PREVAGEN PO Take by mouth.   sertraline 50 MG tablet Commonly known as: ZOLOFT TAKE 1 1/2 TABLETS BY MOUTH EVERY MORNING   simvastatin 40 MG tablet Commonly known as: ZOCOR TAKE 1 TABLET BY MOUTH EVERY NIGHT AT BEDTIME   traZODone 50 MG tablet Commonly known as: DESYREL TAKE ONE OR TWO TABLETS AT BEDTIME    triamcinolone cream 0.1 % Commonly known as: KENALOG Apply 1 application topically 2 (two) times daily.   Ubiquinol 200 MG Caps Take by mouth.   Vitamin C Plus 500 MG Tabs Take 2 tablets by mouth daily.       Birth History: non-contributory  Developmental History: non-contributory  Past Surgical History: Past Surgical History:  Procedure Laterality Date  . CESAREAN SECTION  1990  . CHOLECYSTECTOMY  2005  . CYST EXCISION  1996   Tracheal Cyst     Family History: Family History  Problem Relation Age of Onset  . Heart attack Father        1st MI at age 45     Social History: Ashima lives at home with her husband. She lives in a house that is 75 years old. There is hardwood throughout the home. They have gas heating and central cooling. There are dust mite coverings on the bedding. There is no tobacco exposure in the home. 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     Review of Systems  Constitutional: Negative.  Negative for chills, fever, malaise/fatigue and weight loss.  HENT: Negative.  Negative for congestion, ear discharge, ear pain and sore throat.   Eyes: Negative for pain, discharge and redness.  Respiratory: Negative for cough, sputum production, shortness of breath and wheezing.   Cardiovascular: Negative.  Negative for chest pain and palpitations.  Gastrointestinal: Negative for abdominal pain, constipation, diarrhea, heartburn, nausea and vomiting.  Skin: Positive for itching and rash.  Neurological: Negative for dizziness and headaches.  Endo/Heme/Allergies: Negative for environmental allergies. Does not bruise/bleed easily.       Objective:   Blood pressure 126/70, pulse 80, temperature 98.2 F (36.8 C), temperature source Oral, resp. rate 18, height _0  (1.549 m), weight 168 lb (76.2 kg), SpO2 96 %. Body mass index is 31.74 kg/m.   Physical Exam:   Physical Exam  Constitutional: She appears well-developed.  Very  pleasant female. Clearly irritable from all of the itching.   HENT:  Head: Normocephalic and atraumatic.  Right Ear: Tympanic membrane, external ear and ear canal normal. No drainage, swelling or tenderness. Tympanic membrane is not injected, not scarred, not erythematous, not retracted and not bulging.  Left Ear: Tympanic membrane, external ear and ear canal normal. No drainage, swelling or tenderness. Tympanic membrane is not injected, not scarred, not erythematous, not retracted and not bulging.  Nose: Mucosal edema and rhinorrhea present. No nasal deformity or septal deviation. No epistaxis. Right sinus exhibits no maxillary sinus tenderness and no frontal sinus tenderness. Left sinus exhibits no maxillary sinus tenderness  and no frontal sinus tenderness.  Mouth/Throat: Uvula is midline and oropharynx is clear and moist. Mucous membranes are not pale and not dry.  Minimal cobblestoning present in the posterior oropharynx.   Eyes: Pupils are equal, round, and reactive to light. Conjunctivae and EOM are normal. Right eye exhibits no chemosis and no discharge. Left eye exhibits no chemosis and no discharge. Right conjunctiva is not injected. Left conjunctiva is not injected.  Cardiovascular: Normal rate, regular rhythm and normal heart sounds.  Respiratory: Effort normal and breath sounds normal. No accessory muscle usage. No tachypnea. No respiratory distress. She has no wheezes. She has no rhonchi. She has no rales. She exhibits no tenderness.  Moving air well in all lung fields.   GI: There is no abdominal tenderness. There is no rebound and no guarding.  Lymphadenopathy:       Head (right side): No submandibular, no tonsillar and no occipital adenopathy present.       Head (left side): No submandibular, no tonsillar and no occipital adenopathy present.    She has no cervical adenopathy.  Neurological: She is alert.  Skin: No abrasion, no petechiae and no rash noted. Rash is not papular, not  vesicular and not urticarial. No erythema. No pallor.  There are scattered urticarial lesions over the entirety of the thorax as well as the back. There are also some lesions noted on the bilateral arms. There is some crusting but no discharge.   Psychiatric: She has a normal mood and affect.     Diagnostic studies: none (deferred since she was covered in hives)         Salvatore Marvel, MD [facility] [place] [facility] [place]

## 2018-07-11 ENCOUNTER — Telehealth: Payer: Self-pay | Admitting: Allergy & Immunology

## 2018-07-11 NOTE — Telephone Encounter (Signed)
PT called for lab results.  Ain [phone]

## 2018-07-14 ENCOUNTER — Inpatient Hospital Stay: Payer: Medicare Other | Attending: Oncology

## 2018-07-14 ENCOUNTER — Other Ambulatory Visit: Payer: Self-pay | Admitting: *Deleted

## 2018-07-14 ENCOUNTER — Other Ambulatory Visit: Payer: Self-pay

## 2018-07-14 ENCOUNTER — Inpatient Hospital Stay (HOSPITAL_BASED_OUTPATIENT_CLINIC_OR_DEPARTMENT_OTHER): Payer: Medicare Other | Admitting: Oncology

## 2018-07-14 ENCOUNTER — Encounter: Payer: Self-pay | Admitting: Oncology

## 2018-07-14 VITALS — BP 144/83 | HR 71 | Temp 97.9°F | Resp 18 | Wt 167.8 lb

## 2018-07-14 DIAGNOSIS — D693 Immune thrombocytopenic purpura: Secondary | ICD-10-CM

## 2018-07-14 DIAGNOSIS — Z79899 Other long term (current) drug therapy: Secondary | ICD-10-CM | POA: Insufficient documentation

## 2018-07-14 DIAGNOSIS — F329 Major depressive disorder, single episode, unspecified: Secondary | ICD-10-CM | POA: Insufficient documentation

## 2018-07-14 DIAGNOSIS — I10 Essential (primary) hypertension: Secondary | ICD-10-CM | POA: Insufficient documentation

## 2018-07-14 DIAGNOSIS — K760 Fatty (change of) liver, not elsewhere classified: Secondary | ICD-10-CM

## 2018-07-14 DIAGNOSIS — R21 Rash and other nonspecific skin eruption: Secondary | ICD-10-CM

## 2018-07-14 LAB — CBC WITH DIFFERENTIAL/PLATELET
Abs Immature Granulocytes: 0.04 10*3/uL (ref 0.00–0.07)
Basophils Absolute: 0.1 10*3/uL (ref 0.0–0.1)
Basophils Relative: 1 %
Eosinophils Absolute: 0.4 10*3/uL (ref 0.0–0.5)
Eosinophils Relative: 5 %
HCT: 43 % (ref 36.0–46.0)
Hemoglobin: 14.1 g/dL (ref 12.0–15.0)
Immature Granulocytes: 1 %
Lymphocytes Relative: 32 %
Lymphs Abs: 2.8 10*3/uL (ref 0.7–4.0)
MCH: 28.9 pg (ref 26.0–34.0)
MCHC: 32.8 g/dL (ref 30.0–36.0)
MCV: 88.1 fL (ref 80.0–100.0)
Monocytes Absolute: 0.8 10*3/uL (ref 0.1–1.0)
Monocytes Relative: 9 %
Neutro Abs: 4.6 10*3/uL (ref 1.7–7.7)
Neutrophils Relative %: 52 %
Platelets: 131 10*3/uL — ABNORMAL LOW (ref 150–400)
RBC: 4.88 MIL/uL (ref 3.87–5.11)
RDW: 13.2 % (ref 11.5–15.5)
WBC: 8.7 10*3/uL (ref 4.0–10.5)
nRBC: 0 % (ref 0.0–0.2)

## 2018-07-14 NOTE — Telephone Encounter (Signed)
Results sent via MyChart and routed to the pool as well.   Salvatore Marvel, MD All[facility] [place] [facility] [place]

## 2018-07-14 NOTE — Progress Notes (Signed)
Patient here for follow up. Pt states she has a rash to right anterior shoulder and low midback, which she has seen PCP and dermatologist for.

## 2018-07-14 NOTE — Telephone Encounter (Signed)
Pt called regarding lab results- informed her that not all labs are back yet and we will call her back when they result. She stated she is taking 2 allegra in the morning and 2 zyrtec at night and that the zyrtec is not helping with the itching. I instructed her to take pepcid 20 mg daily since she was not currently taking that.

## 2018-07-15 MED ORDER — MONTELUKAST SODIUM 10 MG PO TABS: 10.0000 mg | ORAL_TABLET | Freq: Every day | ORAL | 5 refills | Status: DC

## 2018-07-15 NOTE — Progress Notes (Signed)
Hematology/Oncology Follow up visit [facility] [phone] [phone] Patient Care Team: Birdie Sons, MD as PCP - General (Family Medicine)  REASON FOR VISIT Follow up for treatment of thrombocytopenia  PERTINENT HEMATOLOGY HISTORY Julia Garner 69 y.o.  female who has a history of recurrent ITP. Before she was referred to me, she has a long history of ITP which were treated with sterids by primary care physician and she used to respond to steroids. She was seen by anther Hematologist Dr.Pandit many years ago. Dr.Pandit has left [place].  She had labs doneon 08/22/2016. Platelet counts were slow at 15,000. She was treated with a short course (5 days) of Steroids by Dr.Fisher and platelet recovered to 120,000, however later dropped again to 11,000 on 09/10/2016.  I treated her with a longer course of prednisone with slow tapering and her counts again responded to treatment. During the tapering the course,she visited home country [place] and had blood work done there after she finished the tapering course of Prednisone there. She reports the platelet counts were normal in [place].   Patient has had lab work up including negative hepatitis, HIV, normal LDH, normal B12 and folate level. ANA was positive and she was evaluated by rheumatologist. Based on my phone discussion with Rheumatologist, patient has had rheumatology work up and was not considered to have any autoimmune problems. She does not have hepatosplenomegaly.   Patient returned to clinic in December and repeat blood work on 12/26/2016 showed platelet count of 16,000. Treated with a course of Dexamethasone 44m x 4 days, platelet again responded and dropped again.  And she reports easy bruising, feeling tired.  Peripheral blood flowcytometry 1% of analyzed cells containing clonal B cell population which are CD5- and CD 10-.  There is mildly increased eosinophils 7%.   Patient does  not have any palpable lymphadenopathy on physical examination and we obtained CT which showed no pathological lymphadenopathy. Bone marrow biopsy showed hypercellular marrow with trilineage hematopoiesis including increased megakaryocytes. No evidence of lymphoproliferative process involvement. Normal cytogenetics.  At this point patient has had extensive work up for her thrombocytopenia, and we discussed in lengthy that she most likely have ITP, there maybe a low grade lymphoma too. Given that she has recurrent ITP and platelet counts cannot be maintained without steroids, I suggest Rituximab weekly x 4 for consolidation. Patient has requested me to talk to her son in law Dr.Kamron who is a nephrologist in [place] I have talked to Dr.Kamron about the rationale of using Rituximab and also the side effects profile. Dr.Kamron agrees the plan and has relayed information to patient. I have also discussed the side effects of Rituximab with patient. She agrees with treatment.   INTERVAL HISTORY Julia Frateris a 68y.o. female who has above hematology history reviewed by me today presents for follow up visit for ITP.  Previously status post rituximab weekly x 4.  Denies any bleeding events. He developed on chest few months ago, rash is itchy, no exacerbating or alleviating factors.  Denies any change of laundry detergent, new medication, new supplements she has seen to dermatologist and allergist and even had a biopsy.  Per patient biopsy is nonconclusive.  Patient was instructed to try different creams and allergic medication.   Chart was reviewed working diagnosis is chronic urticaria patient was recommended to take Allegra 2 tablets in the morning, as I take 2 tablets in the evening.  Also Pepcid 20 mg daily. She is upset that  rash has not completely resolved yet. Patient also had blood work-up normal tryptase level,  ESR level elevated at 50, IgG elevated 1665, ANA negative, she also had allergen profile,  strep pneumonia, chronic urticaria panel, alpha gal panel.   Denies hematochezia, hematuria, hematemesis, epistaxis, black tarry stool or easy bruising.    ROS:  Review of Systems  Constitutional: Negative for appetite change, chills and fatigue.  HENT:   Negative for hearing loss, lump/mass and nosebleeds.   Eyes: Negative for eye problems and icterus.  Respiratory: Negative for chest tightness and cough.   Cardiovascular: Negative for chest pain and leg swelling.  Gastrointestinal: Negative for abdominal distention.  Genitourinary: Negative for difficulty urinating and dysuria.   Musculoskeletal: Negative for gait problem and neck pain.  Skin: Positive for rash. Negative for itching.  Neurological: Negative for dizziness, gait problem and headaches.  Hematological: Negative for adenopathy. Does not bruise/bleed easily.  Psychiatric/Behavioral: The patient is not nervous/anxious.     MEDICAL HISTORY:  Past Medical History:  Diagnosis Date  . Allergy   . Chronic ITP (idiopathic thrombocytopenia) (HCC)   . Depression   . Head injuries   . History of seizure 2007   one seizure   . Hypertension     SURGICAL HISTORY: Past Surgical History:  Procedure Laterality Date  . CESAREAN SECTION  1990  . CHOLECYSTECTOMY  2005  . CYST EXCISION  1996   Tracheal Cyst    SOCIAL HISTORY: Social History   Socioeconomic History  . Marital status: Married    Spouse name: Not on file  . Number of children: 3  . Years of education: Not on file  . Highest education level: Not on file  Occupational History  . Occupation: retired    Comment: Community education officer  Social Needs  . Financial resource strain: Not on file  . Food insecurity    Worry: Not on file    Inability: Not on file  . Transportation needs    Medical: Not on file    Non-medical: Not on file  Tobacco Use  . Smoking status: Never Smoker  . Smokeless tobacco: Never Used  Substance and Sexual Activity  . Alcohol use:  No    Alcohol/week: 0.0 standard drinks  . Drug use: No  . Sexual activity: Not on file  Lifestyle  . Physical activity    Days per week: Not on file    Minutes per session: Not on file  . Stress: Not on file  Relationships  . Social Herbalist on phone: Not on file    Gets together: Not on file    Attends religious service: Not on file    Active member of club or organization: Not on file    Attends meetings of clubs or organizations: Not on file    Relationship status: Not on file  . Intimate partner violence    Fear of current or ex partner: Not on file    Emotionally abused: Not on file    Physically abused: Not on file    Forced sexual activity: Not on file  Other Topics Concern  . Not on file  Social History Narrative  . Not on file    FAMILY HISTORY: Family History  Problem Relation Age of Onset  . Heart attack Father        1st MI at age 72    ALLERGIES:  is allergic to niacin and related; penicillins; and tylenol  [acetaminophen].  MEDICATIONS:  Current Outpatient Medications  Medication Sig Dispense Refill  . Apoaequorin (PREVAGEN PO) Take 1 tablet by mouth daily.     Marland Kitchen b complex vitamins capsule Take 1 capsule by mouth daily.    Marland Kitchen Bioflavonoid Products (VITAMIN C PLUS) 500 MG TABS Take 2 tablets by mouth daily.     . Biotin 1000 MCG tablet Take 1,000 mcg by mouth daily.    . Calcium Carbonate-Vit D-Min (CALCIUM 1200 PO) Take by mouth.    . cetirizine (ZYRTEC) 10 MG tablet Take 20 mg by mouth at bedtime.    . clobetasol (TEMOVATE) 0.05 % external solution Apply 1 application topically 2 (two) times daily.    . famotidine (PEPCID) 40 MG tablet Take 40 mg by mouth daily.    . fexofenadine (ALLEGRA) 180 MG tablet Take 180 mg by mouth daily.    . Flaxseed, Linseed, (FLAXSEED OIL) 1000 MG CAPS Take by mouth.    Marland Kitchen glucosamine-chondroitin 500-400 MG tablet Take 1 tablet by mouth 3 (three) times daily.    . irbesartan-hydrochlorothiazide (AVALIDE)  150-12.5 MG tablet Take 1 tablet by mouth daily. 90 tablet 4  . Multiple Vitamin (MULTIVITAMIN) tablet Take 1 tablet by mouth daily.    . Naproxen Sodium (ALEVE) 220 MG CAPS Take by mouth daily as needed.    . Omega 3 1200 MG CAPS Take 2 capsules by mouth daily.    . sertraline (ZOLOFT) 50 MG tablet TAKE 1 1/2 TABLETS BY MOUTH EVERY MORNING 135 tablet 4  . simvastatin (ZOCOR) 40 MG tablet TAKE 1 TABLET BY MOUTH EVERY NIGHT AT BEDTIME 90 tablet 4  . traZODone (DESYREL) 50 MG tablet TAKE ONE OR TWO TABLETS AT BEDTIME 180 tablet 4  . triamcinolone cream (KENALOG) 0.1 % Apply 1 application topically 2 (two) times daily.    Marland Kitchen Ubiquinol 200 MG CAPS Take by mouth.     No current facility-administered medications for this visit.       Marland Kitchen  PHYSICAL EXAMINATION: ECOG PERFORMANCE STATUS: 0 - Asymptomatic Vitals:   07/14/18 1418  BP: (!) 144/83  Pulse: 71  Resp: 18  Temp: 97.9 F (36.6 C)   Filed Weights   07/14/18 1418  Weight: 167 lb 12.8 oz (76.1 kg)  Physical Exam  Constitutional: She is oriented to person, place, and time. No distress.  HENT:  Head: Normocephalic and atraumatic.  Nose: Nose normal.  Mouth/Throat: Oropharynx is clear and moist. No oropharyngeal exudate.  Eyes: Pupils are equal, round, and reactive to light. Conjunctivae and EOM are normal. Left eye exhibits no discharge. No scleral icterus.  Neck: Normal range of motion. Neck supple. No JVD present.  Cardiovascular: Normal rate, regular rhythm and normal heart sounds.  No murmur heard. Pulmonary/Chest: Effort normal and breath sounds normal. No respiratory distress. She has no wheezes. She has no rales. She exhibits no tenderness.  Abdominal: Soft. Bowel sounds are normal. She exhibits no distension and no mass. There is no abdominal tenderness. There is no rebound.  Musculoskeletal: Normal range of motion.        General: No tenderness or edema.  Lymphadenopathy:    She has no cervical adenopathy.  Neurological:  She is alert and oriented to person, place, and time. No cranial nerve deficit. She exhibits normal muscle tone. Coordination normal.  Skin: Skin is dry. Rash noted. She is not diaphoretic.  scattered urticarial lesions over the thorax as well as lower back.  Psychiatric: Memory, affect and judgment normal.  LABORATORY DATA:  I have reviewed the data as listed Lab Results  Component Value Date   WBC 8.7 07/14/2018   HGB 14.1 07/14/2018   HCT 43.0 07/14/2018   MCV 88.1 07/14/2018   PLT 131 (L) 07/14/2018   Recent Labs    09/13/17 1446 07/03/18 1602  NA 140 143  K 3.5 3.5  CL 100 104  CO2 25 25  GLUCOSE 106* 87  BUN 12 14  CREATININE 0.71 0.76  CALCIUM 9.5 9.1  GFRNONAA 90 83  GFRAA 103 95  PROT 7.2 7.1  ALBUMIN 3.9 4.1  AST 17 15  ALT 20 25  ALKPHOS 73 65  BILITOT 0.3 0.4     RADIOGRAPHIC STUDIES: I have personally reviewed the radiological images as listed and agreed with the findings in the report.   ultrasound of the abdomen showed  no spleenmegaly. Fatty liver disease.  01/17/2017 CT chest abdomen pelvis with contrast showed no pathological adenopathy was identified, aortic atherosclerosis, three-vessel coronary artery atherosclerotic calcification. Cholecystectomy, small bladder cystocele due to pelvic floor laxity. Prominent stool throughout the colon favors constipation.   ASSESSMENT & PLAN:  1. Chronic ITP (idiopathic thrombocytopenia) (HCC)   2. Rash   3. Fatty liver disease, nonalcoholic   Chronic ITP  labs are reviewed and discussed with patient. Platelet counts at 1 31,000.  Slightly lower than last visit.  Still considered to be fairly stable at her baseline.  #Skin rash, continue to follow-up with allergist.  Likely urticaria lesions. #History of fatty liver disease, I will check a CMP at the next visit. All questions were answered. The patient knows to call the clinic with any problems questions or concerns. Follow up in 6 months.   Total face to face encounter time for this patient visit was 15 min. >50% of the time was  spent in counseling and coordination of care.    Earlie Server, MD, PhD Hematology Oncology [facility] Pager- [phone] 07/15/2018

## 2018-07-15 NOTE — Telephone Encounter (Signed)
That sounds good - we can add on Singulair as well?   If that does not work, we can change the night time Zyrtec to Xyzal.  Salvatore Marvel, MD Allergy and [place] [facility]

## 2018-07-15 NOTE — Telephone Encounter (Signed)
Pt informed. Pt will continue taking pepcid first for a few more days to see if that helps it and if not then she will pick up singulair.

## 2018-07-15 NOTE — Addendum Note (Signed)
Addended by: Katherina Right D on: 07/15/2018 09:12 AM   Modules accepted: Orders

## 2018-07-15 NOTE — Addendum Note (Signed)
Addended by: Katherina Right D on: 07/15/2018 01:44 PM   Modules accepted: Orders

## 2018-07-15 NOTE — Telephone Encounter (Signed)
Called and husband answered pt was still sleeping, she will call back when she wakes up.

## 2018-07-24 LAB — STREP PNEUMONIAE 23 SEROTYPES IGG
Pneumo Ab Type 1*: 1 ug/mL — ABNORMAL LOW (ref >1.3)
Pneumo Ab Type 12 (12F)*: 0.3 ug/mL — ABNORMAL LOW (ref >1.3)
Pneumo Ab Type 14*: 4.7 ug/mL (ref >1.3)
Pneumo Ab Type 17 (17F)*: 0.1 ug/mL — ABNORMAL LOW (ref >1.3)
Pneumo Ab Type 19 (19F)*: 8.9 ug/mL (ref >1.3)
Pneumo Ab Type 2*: <0.1 ug/mL — ABNORMAL LOW (ref >1.3)
Pneumo Ab Type 20*: 0.6 ug/mL — ABNORMAL LOW (ref >1.3)
Pneumo Ab Type 22 (22F)*: 0.2 ug/mL — ABNORMAL LOW (ref >1.3)
Pneumo Ab Type 23 (23F)*: 9 ug/mL (ref >1.3)
Pneumo Ab Type 26 (6B)*: 8.4 ug/mL (ref >1.3)
Pneumo Ab Type 3*: 4.2 ug/mL (ref >1.3)
Pneumo Ab Type 34 (10A)*: 0.2 ug/mL — ABNORMAL LOW (ref >1.3)
Pneumo Ab Type 4*: 0.9 ug/mL — ABNORMAL LOW (ref >1.3)
Pneumo Ab Type 43 (11A)*: 0.8 ug/mL — ABNORMAL LOW (ref >1.3)
Pneumo Ab Type 5*: 0.1 ug/mL — ABNORMAL LOW (ref >1.3)
Pneumo Ab Type 51 (7F)*: 13.2 ug/mL (ref >1.3)
Pneumo Ab Type 54 (15B)*: 0.2 ug/mL — ABNORMAL LOW (ref >1.3)
Pneumo Ab Type 56 (18C)*: 2 ug/mL (ref >1.3)
Pneumo Ab Type 57 (19A)*: 15.9 ug/mL (ref >1.3)
Pneumo Ab Type 68 (9V)*: 3.8 ug/mL (ref >1.3)
Pneumo Ab Type 70 (33F)*: 2.2 ug/mL (ref >1.3)
Pneumo Ab Type 8*: 1.2 ug/mL — ABNORMAL LOW (ref >1.3)
Pneumo Ab Type 9 (9N)*: 2.2 ug/mL (ref >1.3)

## 2018-07-24 LAB — IGE+ALLERGENS ZONE 2(30)
Alternaria Alternata IgE: <0.1 kU/L
Amer Sycamore IgE Qn: <0.1 kU/L
Aspergillus Fumigatus IgE: <0.1 kU/L
Bahia Grass IgE: <0.1 kU/L
Bermuda Grass IgE: <0.1 kU/L
Cat Dander IgE: <0.1 kU/L
Cedar, Mountain IgE: <0.1 kU/L
Cladosporium Herbarum IgE: <0.1 kU/L
Cockroach, American IgE: <0.1 kU/L
Common Silver Birch IgE: 2.24 kU/L — AB
D Farinae IgE: <0.1 kU/L
D Pteronyssinus IgE: <0.1 kU/L
Dog Dander IgE: <0.1 kU/L
Elm, American IgE: <0.1 kU/L
Hickory, White IgE: 0.2 kU/L — AB
IgE (Immunoglobulin E), Serum: 18 IU/mL (ref 6–495)
Johnson Grass IgE: <0.1 kU/L
Maple/Box Elder IgE: <0.1 kU/L
Mucor Racemosus IgE: <0.1 kU/L
Mugwort IgE Qn: <0.1 kU/L
Nettle IgE: <0.1 kU/L
Oak, White IgE: 1.29 kU/L — AB
Penicillium Chrysogen IgE: <0.1 kU/L
Pigweed, Rough IgE: <0.1 kU/L
Plantain, English IgE: <0.1 kU/L
Ragweed, Short IgE: <0.1 kU/L
Sheep Sorrel IgE Qn: <0.1 kU/L
Stemphylium Herbarum IgE: <0.1 kU/L
Sweet gum IgE RAST Ql: 0.15 kU/L — AB
Timothy Grass IgE: <0.1 kU/L
White Mulberry IgE: <0.1 kU/L

## 2018-07-24 LAB — CHRONIC URTICARIA: cu index: <2.3 (ref <10)

## 2018-07-24 LAB — CMP14+EGFR
ALT: 25 IU/L (ref 0–32)
AST: 15 IU/L (ref 0–40)
Albumin/Globulin Ratio: 1.4 (ref 1.2–2.2)
Albumin: 4.1 g/dL (ref 3.8–4.8)
Alkaline Phosphatase: 65 IU/L (ref 39–117)
BUN/Creatinine Ratio: 18 (ref 12–28)
BUN: 14 mg/dL (ref 8–27)
Bilirubin Total: 0.4 mg/dL (ref 0.0–1.2)
CO2: 25 mmol/L (ref 20–29)
Calcium: 9.1 mg/dL (ref 8.7–10.3)
Chloride: 104 mmol/L (ref 96–106)
Creatinine, Ser: 0.76 mg/dL (ref 0.57–1.00)
GFR calc Af Amer: 95 mL/min/{1.73_m2} (ref >59)
GFR calc non Af Amer: 83 mL/min/{1.73_m2} (ref >59)
Globulin, Total: 3 g/dL (ref 1.5–4.5)
Glucose: 87 mg/dL (ref 65–99)
Potassium: 3.5 mmol/L (ref 3.5–5.2)
Sodium: 143 mmol/L (ref 134–144)
Total Protein: 7.1 g/dL (ref 6.0–8.5)

## 2018-07-24 LAB — TRYPTASE: Tryptase: 6.3 ug/L (ref 2.2–13.2)

## 2018-07-24 LAB — ALLERGEN PROFILE, BASIC FOOD
Allergen Corn, IgE: <0.1 kU/L
Beef IgE: <0.1 kU/L
Chocolate/Cacao IgE: <0.1 kU/L
Egg, Whole IgE: <0.1 kU/L
Food Mix (Seafoods) IgE: NEGATIVE
Milk IgE: <0.1 kU/L
Peanut IgE: <0.1 kU/L
Pork IgE: <0.1 kU/L
Soybean IgE: <0.1 kU/L
Wheat IgE: <0.1 kU/L

## 2018-07-24 LAB — SEDIMENTATION RATE: Sed Rate: 50 mm/hr — ABNORMAL HIGH (ref 0–40)

## 2018-07-24 LAB — DIPHTHERIA / TETANUS ANTIBODY PANEL
Diphtheria Ab: >3 IU/mL (ref <0.10)
Tetanus Ab, IgG: 1.3 IU/mL (ref <0.10)

## 2018-07-24 LAB — THYROID ANTIBODIES
Thyroglobulin Antibody: <1 IU/mL (ref 0.0–0.9)
Thyroperoxidase Ab SerPl-aCnc: 8 IU/mL (ref 0–34)

## 2018-07-24 LAB — ALPHA-GAL PANEL
Alpha Gal IgE*: <0.1 kU/L (ref <0.10)
Beef (Bos spp) IgE: <0.1 kU/L (ref <0.35)
Class Interpretation: 0
Class Interpretation: 0
Class Interpretation: 0
Lamb/Mutton (Ovis spp) IgE: <0.1 kU/L (ref <0.35)
Pork (Sus spp) IgE: <0.1 kU/L (ref <0.35)

## 2018-07-24 LAB — COMPLEMENT, TOTAL: Compl, Total (CH50): >60 U/mL (ref >41)

## 2018-07-24 LAB — IGG, IGA, IGM
IgA/Immunoglobulin A, Serum: 315 mg/dL (ref 87–352)
IgG (Immunoglobin G), Serum: 1665 mg/dL — ABNORMAL HIGH (ref 586–1602)
IgM (Immunoglobulin M), Srm: 28 mg/dL (ref 26–217)

## 2018-07-24 LAB — C-REACTIVE PROTEIN: CRP: 3 mg/L (ref 0–10)

## 2018-07-24 LAB — ANA W/REFLEX IF POSITIVE: Anti Nuclear Antibody (ANA): NEGATIVE

## 2018-08-01 ENCOUNTER — Telehealth: Payer: Self-pay

## 2018-08-01 NOTE — Telephone Encounter (Signed)
I think we should just schedule her for a follow up visit to discuss labs. Discussed with Morey Hummingbird and apparently there is a language barrier which is unlikely to be helping things. Ideally an in-person visit with an interpreter would be best.  Salvatore Marvel, MD Allergy and [place] [facility]

## 2018-08-01 NOTE — Telephone Encounter (Signed)
Pt called looking for lab results from June informed pt that Dr Ernst Bowler would look over them and call her. She does not like using the Internet as she would not understand what it is stating.

## 2018-08-01 NOTE — Telephone Encounter (Signed)
Scheduled pt for a inperson visit so she can go over lab results with dr gallahger

## 2018-08-11 ENCOUNTER — Encounter: Payer: Self-pay | Admitting: Allergy & Immunology

## 2018-08-14 ENCOUNTER — Other Ambulatory Visit: Payer: Self-pay

## 2018-08-14 ENCOUNTER — Ambulatory Visit (INDEPENDENT_AMBULATORY_CARE_PROVIDER_SITE_OTHER): Payer: Medicare Other | Admitting: Allergy & Immunology

## 2018-08-14 ENCOUNTER — Encounter: Payer: Self-pay | Admitting: Allergy & Immunology

## 2018-08-14 VITALS — BP 134/76 | HR 72 | Temp 97.2°F | Resp 18

## 2018-08-14 DIAGNOSIS — D693 Immune thrombocytopenic purpura: Secondary | ICD-10-CM

## 2018-08-14 DIAGNOSIS — B999 Unspecified infectious disease: Secondary | ICD-10-CM | POA: Diagnosis not present

## 2018-08-14 DIAGNOSIS — L508 Other urticaria: Secondary | ICD-10-CM | POA: Diagnosis not present

## 2018-08-14 NOTE — Progress Notes (Signed)
FOLLOW UP  Date of Service/Encounter:  08/14/18   Assessment:   Chronic urticaria  Recurrent infections - mostly sinopulmonary in nature  Chronic ITP - with round of rituximab two years ago (weekly x 4 doses)   Julia Garner presents to discuss her lab results. Most of her workup is rather unremarkable, which is reassuring, but we are not much closer to figuring out a cause of her symptoms. We are going to treat with continued suppressive doses of antihistamines. We discussed the use of Xolair as an immunomodulatory agent and provided information for her to take home and peruse. This would be a good option for her.   Regarding her immunodeficiency workup, she needs a Pneumovax and repeat titers in 4-6 weeks. She did have a Prevnar in 2019, but this one does not cover all 23 types of Pneumococcus. We would give her the Pneumovax here in our office, but Medicare only reimburses if it is given in a PCP office. Therefore we recommended that she talk to her PCP about receiving this. It is reassuring that her immunoglobulins are as good as they are, given the fact that she has received rituximab in the past.     Plan/Recommendations:   1. Chronic urticaria - Continue with suppressive doses of antihistamines: Allegra two tablets in the morning and Zyrtec two tablets in the evening with Pepcid two tablets in the evening. - Consider starting Xolair in the future (information provided).  2. Immunodeficiency workup - We need you to get a Pneumovax and then repeat Streptococcal titers in 4-6 weeks. - I will send a note to your PCP.  3. Return in about 3 months (around 11/14/2018). This can be an in-person, a virtual Webex or a telephone follow up visit.   Subjective:   Julia Garner is a 68 y.o. female presenting today for follow up of  Chief Complaint  Patient presents with  . Results    Review labs    Julia Garner has a history of the following: Patient Active Problem List    Diagnosis Date Noted  . Diabetes mellitus without complication ([place]) 78/58/8502  . Acute ITP ([place]) 09/16/2016  . Allergic rhinitis 06/29/2014  . Insomnia 06/29/2014  . Arthralgia of temporomandibular joint 06/29/2014  . Edema 06/29/2014  . Oral aphthae 07/08/2007  . 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 Arthropathy of hand 01/25/2007  . Fam hx-ischem heart disease 01/21/2007  . Menopausal and postmenopausal disorder 10/08/2005  . Tachycardia 10/08/2005  . Thrombocytopenia ([place]) 10/08/2005  . Obstructive sleep apnea of adult 10/24/2004  . Essential (primary) hypertension 01/16/2004  . Depression 01/15/2002    History obtained from: chart review and patient.  Julia Garner is a 68 y.o. female presenting for a follow up visit.  She was last seen in June 2020 as a new patient.  At that time, she was endorsing a history of chronic urticaria.  She had already seen dermatology and had a biopsy performed and we requested these records.  We did obtain an alpha gal panel, CBC, tryptase, chronic urticaria panel, metabolic panel, ESR, and CRP.  We did look for the most common cause of food allergies and we obtained an environmental allergy panel.  We started her on Allegra 2 tablets in the morning and Zyrtec 2 tablets at night.  She has a history of recurrent infections and this in conjunction with history of chronic ITP may be concerned with CVID.  We did obtain screening labs to look for immune deficiency.  Her alpha gal  panel was negative.  Her chronic urticaria panel was negative.  The panel to look at the most common food allergies was negative.  Her environmental allergy panel was positive only to trees.  Her ANA was negative.  Her CRP was normal, but her ESR was ever so slightly elevated.  Her antithyroid antibodies were normal as was her serum tryptase.  Regarding her work-up, her immunoglobulin levels were normal.  She was protective to tetanus and diphtheria.  Her pneumococcal titers showed  detection to only 11 out of 23 types of Streptococcus pneumonia. Complement activity was normal.  Complete blood count was normal.  However, she called back and said she did not like "looking on the Internet" (meaning MyChart) and wanted to talk about these results more one-on-one, therefore brought her in for an appointment.  She has been using her Allegra two in the morning and the Zyrtec two tablets at night. She was on the Pepcid in the morning too. Overall these are controlling her symptoms somewhat. She is definitely better than how she was doing prior to this. She is wondering if there is something else that we could consider regarding treatment, as she takes a lot of medications aside from ours and would prefer to not take more pills.      Otherwise, there have been no changes to her past medical history, surgical history, family history, or social history.    Review of Systems  Constitutional: Negative.  Negative for fever, malaise/fatigue and weight loss.  HENT: Negative.  Negative for congestion, ear discharge and ear pain.   Eyes: Negative for pain, discharge and redness.  Respiratory: Negative for cough, sputum production, shortness of breath and wheezing.   Cardiovascular: Negative.  Negative for chest pain and palpitations.  Gastrointestinal: Negative for abdominal pain and heartburn.  Skin: Positive for itching and rash.  Neurological: Negative for dizziness and headaches.  Endo/Heme/Allergies: Negative for environmental allergies. Does not bruise/bleed easily.       Objective:   Blood pressure 134/76, pulse 72, temperature (!) 97.2 F (36.2 C), temperature source Temporal, resp. rate 18, SpO2 97 %. There is no height or weight on file to calculate BMI.   Physical Exam:  Physical Exam  Constitutional: She appears well-developed.  HENT:  Head: Normocephalic and atraumatic.  Right Ear: Tympanic membrane, external ear and ear canal normal.  Left Ear: Tympanic  membrane and ear canal normal.  Nose: No mucosal edema, rhinorrhea, nasal deformity or septal deviation. No epistaxis. Right sinus exhibits no maxillary sinus tenderness and no frontal sinus tenderness. Left sinus exhibits no maxillary sinus tenderness and no frontal sinus tenderness.  Mouth/Throat: Uvula is midline and oropharynx is clear and moist. Mucous membranes are not pale and not dry.  Eyes: Pupils are equal, round, and reactive to light. Conjunctivae and EOM are normal. Right eye exhibits no chemosis and no discharge. Left eye exhibits no chemosis and no discharge. Right conjunctiva is not injected. Left conjunctiva is not injected.  Cardiovascular: Normal rate, regular rhythm and normal heart sounds.  Respiratory: Effort normal and breath sounds normal. No accessory muscle usage. No tachypnea. No respiratory distress. She has no wheezes. She has no rhonchi. She has no rales. She exhibits no tenderness.  Lymphadenopathy:    She has no cervical adenopathy.  Neurological: She is alert.  Skin: No abrasion, no petechiae and no rash noted. Rash is not papular, not vesicular and not urticarial. No erythema. No pallor.  She does have some isolated urticaria on her  neck today, but overall it appears improved compared to when we saw her last time.   Psychiatric: She has a normal mood and affect.     Diagnostic studies: none      Salvatore Marvel, MD  Allergy and [place] [facility] [place]

## 2018-08-14 NOTE — Patient Instructions (Addendum)
1. Chronic urticaria - Continue with suppressive doses of antihistamines: Allegra two tablets in the morning and Zyrtec two tablets in the evening with Pepcid two tablets in the evening. - Consider starting Xolair in the future (information provided).  2. Immunodeficiency workup - We need you to get a Pneumovax and then repeat Streptococcal titers in 4-6 weeks. - I will send a note to your PCP.  3. Return in about 3 months (around 11/14/2018). This can be an in-person, a virtual Webex or a telephone follow up visit.   Please inform us of any Emergency Department visits, hospitalizations, or changes in symptoms. Call us before going to the ED for breathing or allergy symptoms since we might be able to fit you in for a sick visit. Feel free to contact us anytime with any questions, problems, or concerns.  It was a pleasure to talk to you today today!  Websites that have reliable patient information: 1. American Academy of Asthma, Allergy, and Immunology: www.aaaai.org 2. Food Allergy Research and Education (FARE): foodallergy.org 3. Mothers of Asthmatics: http://www.asthmacommunitynetwork.org 4. American College of Allergy, Asthma, and Immunology: www.acaai.org  "Like" [place] on Facebook and Instagram for our latest updates!      Make sure you are registered to vote! If you have moved or changed any of your contact information, you will need to get this updated before voting!  In some cases, you MAY be able to register to vote online: [email or web address]    Voter ID laws are NOT going into effect for the General Election in November 2020! DO NOT let this stop you from exercising your right to vote!   Absentee voting is the SAFEST way to vote during the coronavirus pandemic!   Download and print an absentee ballot request form at [email or web address]-[email or web address]-Request or you can scan the QR code below with your smart phone:      More information on absentee ballots  can be found here: [email or web address]

## 2018-08-15 ENCOUNTER — Encounter: Payer: Self-pay | Admitting: Allergy & Immunology

## 2018-08-15 DIAGNOSIS — D693 Immune thrombocytopenic purpura: Secondary | ICD-10-CM | POA: Insufficient documentation

## 2018-08-15 DIAGNOSIS — B999 Unspecified infectious disease: Secondary | ICD-10-CM | POA: Insufficient documentation

## 2018-08-15 DIAGNOSIS — L508 Other urticaria: Secondary | ICD-10-CM

## 2018-08-15 HISTORY — DX: Other urticaria: L50.8

## 2018-08-19 ENCOUNTER — Telehealth: Payer: Self-pay | Admitting: Family Medicine

## 2018-08-19 NOTE — Telephone Encounter (Signed)
Dr. Novella Rob saying that Dr. Caryn Section needs to give pt another pneumonia shot . A lot of blood work was done on pt due to itching and believes some antibodies were missing from last year's pneumonia shot from last year.   Please call Ain back at [phone] to discuss.  Thanks, [employer]

## 2018-08-20 NOTE — Telephone Encounter (Signed)
Pt received a Prevnar 13 10/30/2017, so she is not due for her Pneumo 23 until October 2020.  Please review Dr. Bernerd Pho office note 08/14/2018   Thanks,   -Mickel Baas

## 2018-08-20 NOTE — Telephone Encounter (Signed)
She can come in for pneumovax-23. The minimum interval is 8 weeks. The only reason we wait a year is because Medicare will not cover 2 pneumonia vaccines in the same year.

## 2018-08-20 NOTE — Telephone Encounter (Signed)
Pt advised.  Apt made for 11/04/2018 at 2pm.   Thanks,   -Mickel Baas

## 2018-09-08 ENCOUNTER — Telehealth: Payer: Self-pay

## 2018-09-08 NOTE — Telephone Encounter (Signed)
Called patient and advised since she has MCR advantage plan we should go thru the [facility] will mail her application till fill out and return to me

## 2018-09-08 NOTE — Telephone Encounter (Signed)
Patient called to schedule her new start xolair appt. She did schedule but is concerned with how much the injections will be. I told her I would send a message to our biologic coordinator.  Please Advise

## 2018-09-24 ENCOUNTER — Ambulatory Visit (INDEPENDENT_AMBULATORY_CARE_PROVIDER_SITE_OTHER): Payer: Medicare Other | Admitting: *Deleted

## 2018-09-24 DIAGNOSIS — L501 Idiopathic urticaria: Secondary | ICD-10-CM | POA: Diagnosis not present

## 2018-09-24 MED ORDER — OMALIZUMAB 150 MG ~~LOC~~ SOLR
300.0000 mg | SUBCUTANEOUS | Status: DC
  Administered 2018-09-24 – 2019-09-29 (×14): 300 mg via SUBCUTANEOUS

## 2018-10-22 ENCOUNTER — Ambulatory Visit: Payer: Medicare Other

## 2018-10-28 ENCOUNTER — Other Ambulatory Visit: Payer: Self-pay

## 2018-10-28 ENCOUNTER — Ambulatory Visit (INDEPENDENT_AMBULATORY_CARE_PROVIDER_SITE_OTHER): Payer: Medicare Other | Admitting: *Deleted

## 2018-10-28 ENCOUNTER — Ambulatory Visit: Payer: Self-pay

## 2018-10-28 DIAGNOSIS — L508 Other urticaria: Secondary | ICD-10-CM

## 2018-10-28 DIAGNOSIS — L501 Idiopathic urticaria: Secondary | ICD-10-CM | POA: Diagnosis not present

## 2018-11-02 ENCOUNTER — Other Ambulatory Visit: Payer: Self-pay | Admitting: Family Medicine

## 2018-11-03 ENCOUNTER — Other Ambulatory Visit: Payer: Self-pay | Admitting: Family Medicine

## 2018-11-03 MED ORDER — SIMVASTATIN 40 MG PO TABS: 40.0000 mg | ORAL_TABLET | Freq: Every day | ORAL | 4 refills | Status: DC

## 2018-11-03 MED ORDER — IRBESARTAN-HYDROCHLOROTHIAZIDE 150-12.5 MG PO TABS: 1.0000 | ORAL_TABLET | Freq: Every day | ORAL | 4 refills | Status: DC

## 2018-11-04 ENCOUNTER — Ambulatory Visit (INDEPENDENT_AMBULATORY_CARE_PROVIDER_SITE_OTHER): Payer: Medicare Other | Admitting: Family Medicine

## 2018-11-04 ENCOUNTER — Other Ambulatory Visit: Payer: Self-pay

## 2018-11-04 ENCOUNTER — Encounter: Payer: Self-pay | Admitting: Family Medicine

## 2018-11-04 VITALS — BP 112/62 | HR 73 | Temp 96.9°F | Resp 16 | Wt 168.0 lb

## 2018-11-04 DIAGNOSIS — D696 Thrombocytopenia, unspecified: Secondary | ICD-10-CM

## 2018-11-04 DIAGNOSIS — E119 Type 2 diabetes mellitus without complications: Secondary | ICD-10-CM

## 2018-11-04 DIAGNOSIS — Z23 Encounter for immunization: Secondary | ICD-10-CM | POA: Diagnosis not present

## 2018-11-04 DIAGNOSIS — D693 Immune thrombocytopenic purpura: Secondary | ICD-10-CM

## 2018-11-04 DIAGNOSIS — Z1239 Encounter for other screening for malignant neoplasm of breast: Secondary | ICD-10-CM | POA: Diagnosis not present

## 2018-11-04 DIAGNOSIS — Z0000 Encounter for general adult medical examination without abnormal findings: Secondary | ICD-10-CM | POA: Diagnosis not present

## 2018-11-04 DIAGNOSIS — F39 Unspecified mood [affective] disorder: Secondary | ICD-10-CM

## 2018-11-04 DIAGNOSIS — I10 Essential (primary) hypertension: Secondary | ICD-10-CM

## 2018-11-04 DIAGNOSIS — G4700 Insomnia, unspecified: Secondary | ICD-10-CM | POA: Diagnosis not present

## 2018-11-04 DIAGNOSIS — I700 Atherosclerosis of aorta: Secondary | ICD-10-CM

## 2018-11-04 DIAGNOSIS — E2839 Other primary ovarian failure: Secondary | ICD-10-CM

## 2018-11-04 DIAGNOSIS — Z1211 Encounter for screening for malignant neoplasm of colon: Secondary | ICD-10-CM | POA: Diagnosis not present

## 2018-11-04 DIAGNOSIS — K760 Fatty (change of) liver, not elsewhere classified: Secondary | ICD-10-CM

## 2018-11-04 MED ORDER — SERTRALINE HCL 50 MG PO TABS: 100.0000 mg | ORAL_TABLET | Freq: Every day | ORAL | 0 refills | Status: DC

## 2018-11-04 MED ORDER — TRAZODONE HCL 50 MG PO TABS: 100.0000 mg | ORAL_TABLET | Freq: Every day | ORAL | Status: DC

## 2018-11-04 NOTE — Progress Notes (Addendum)
Patient: Doloros Garner, Female    DOB: 1950/12/20, 68 y.o.   MRN: [id number] Visit Date: 11/04/2018  Today's Provider: Lelon Huh, MD   Chief Complaint  Patient presents with  . Medicare Wellness  . Diabetes  . Hypertension  . Hyperlipidemia  . Annual Exam   Subjective:     Annual Physical Julia Garner is a 68 y.o. female. She feels fairly well. She reports exercising 3-4 times weekly. She reports she is sleeping poorly.  -----------------------------------------------------------   Diabetes Mellitus Type II, Follow-up:   Lab Results  Component Value Date   HGBA1C 6.8 (A) 12/25/2017   HGBA1C 6.5 (H) 09/13/2017    Last seen for diabetes 10 months ago.  Management since then includes referring patient to nutritionist. She reports good compliance with treatment. She is not having side effects.  Current symptoms include none and have been stable. Home blood sugar records: blood sugars are not checked regularly  Episodes of hypoglycemia? no   Current insulin regiment: Is not on insulin Most Recent Eye Exam: >1 year ago Weight trend: stable Prior visit with dietician: No Current exercise: walking Current diet habits: well balanced  Pertinent Labs:    Component Value Date/Time   CHOL 120 09/13/2017 1446   TRIG 247 (H) 09/13/2017 1446   HDL 31 (L) 09/13/2017 1446   LDLCALC 40 09/13/2017 1446   CREATININE 0.76 07/03/2018 1602    Wt Readings from Last 3 Encounters:  11/04/18 168 lb (76.2 kg)  07/14/18 167 lb 12.8 oz (76.1 kg)  07/03/18 168 lb (76.2 kg)    ------------------------------------------------------------------------  Lipid/Cholesterol, Follow-up:   Last seen for this 1 years ago.  Management changes since that visit include none. . Last Lipid Panel:    Component Value Date/Time   CHOL 120 09/13/2017 1446   TRIG 247 (H) 09/13/2017 1446   HDL 31 (L) 09/13/2017 1446   CHOLHDL 3.9 09/13/2017 1446   LDLCALC 40 09/13/2017 1446     Risk factors for vascular disease include diabetes mellitus, hypercholesterolemia and hypertension  She reports good compliance with treatment. She is not having side effects.  Current symptoms include none and have been stable. Weight trend: stable Prior visit with dietician: yes  Current diet: well balanced Current exercise: walking  Wt Readings from Last 3 Encounters:  11/04/18 168 lb (76.2 kg)  07/14/18 167 lb 12.8 oz (76.1 kg)  07/03/18 168 lb (76.2 kg)    -------------------------------------------------------------------  Hypertension, follow-up:  BP Readings from Last 3 Encounters:  11/04/18 112/62  08/14/18 134/76  07/14/18 (!) 144/83    She was last seen for hypertension 1 years ago.  BP at that visit was 116/74. Management since that visit includes no changes. She reports good compliance with treatment. She is not having side effects.  She is exercising. She is adherent to low salt diet.   Outside blood pressures are checked occasionally. She is experiencing chest pain.  Patient denies chest pressure/discomfort, claudication, dyspnea, exertional chest pressure/discomfort, fatigue, irregular heart beat, lower extremity edema, near-syncope, palpitations, paroxysmal nocturnal dyspnea, syncope and tachypnea.   Cardiovascular risk factors include advanced age (older than 62 for men, 4 for women), diabetes mellitus, dyslipidemia and hypertension.  Use of agents associated with hypertension: none.     Weight trend: stable Wt Readings from Last 3 Encounters:  11/04/18 168 lb (76.2 kg)  07/14/18 167 lb 12.8 oz (76.1 kg)  07/03/18 168 lb (76.2 kg)    Current diet: well balanced  ------------------------------------------------------------------------  Review of Systems  Constitutional: Negative for chills, fatigue and fever.  HENT: Positive for mouth sores and sinus pressure. Negative for congestion, ear pain, rhinorrhea, sneezing and sore throat.   Eyes:  Negative.  Negative for pain and redness.  Respiratory: Negative for cough, shortness of breath and wheezing.   Cardiovascular: Positive for chest pain. Negative for leg swelling.  Gastrointestinal: Negative for abdominal pain, blood in stool, constipation, diarrhea and nausea.  Endocrine: Negative for polydipsia and polyphagia.  Genitourinary: Negative.  Negative for dysuria, flank pain, hematuria, pelvic pain, vaginal bleeding and vaginal discharge.  Musculoskeletal: Positive for arthralgias. Negative for back pain, gait problem and joint swelling.  Skin: Negative for rash.  Allergic/Immunologic: Positive for environmental allergies.  Neurological: Positive for light-headedness. Negative for dizziness, tremors, seizures, weakness, numbness and headaches.  Hematological: Negative for adenopathy.  Psychiatric/Behavioral: Positive for sleep disturbance. Negative for behavioral problems, confusion and dysphoric mood. The patient is not nervous/anxious and is not hyperactive.     Social History   Socioeconomic History  . Marital status: Married    Spouse name: Not on file  . Number of children: 3  . Years of education: Not on file  . Highest education level: Not on file  Occupational History  . Occupation: retired    Comment: Adult nursehysical Therapist  Social Needs  . Financial resource strain: Not on file  . Food insecurity    Worry: Not on file    Inability: Not on file  . Transportation needs    Medical: Not on file    Non-medical: Not on file  Tobacco Use  . Smoking status: Never Smoker  . Smokeless tobacco: Never Used  Substance and Sexual Activity  . Alcohol use: No    Alcohol/week: 0.0 standard drinks  . Drug use: No  . Sexual activity: Not on file  Lifestyle  . Physical activity    Days per week: Not on file    Minutes per session: Not on file  . Stress: Not on file  Relationships  . Social Musicianconnections    Talks on phone: Not on file    Gets together: Not on file     Attends religious service: Not on file    Active member of club or organization: Not on file    Attends meetings of clubs or organizations: Not on file    Relationship status: Not on file  . Intimate partner violence    Fear of current or ex partner: Not on file    Emotionally abused: Not on file    Physically abused: Not on file    Forced sexual activity: Not on file  Other Topics Concern  . Not on file  Social History Narrative  . Not on file    Past Medical History:  Diagnosis Date  . Allergy   . Chronic ITP (idiopathic thrombocytopenia) (HCC)   . Chronic urticaria 08/15/2018  . Depression   . Head injuries   . History of seizure 2007   one seizure   . Hypertension      Patient Active Problem List   Diagnosis Date Noted  . Chronic urticaria 08/15/2018  . Chronic ITP (idiopathic thrombocytopenia) (HCC) 08/15/2018  . Recurrent infections 08/15/2018  . Diabetes mellitus without complication (HCC) 09/15/2017  . Acute ITP (HCC) 09/16/2016  . Allergic rhinitis 06/29/2014  . Insomnia 06/29/2014  . Arthralgia of temporomandibular joint 06/29/2014  . Edema 06/29/2014  . Oral aphthae 07/08/2007  . 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 Arthropathy of hand  01/25/2007  . Fam hx-ischem heart disease 01/21/2007  . Menopausal and postmenopausal disorder 10/08/2005  . Tachycardia 10/08/2005  . Thrombocytopenia (HCC) 10/08/2005  . Obstructive sleep apnea of adult 10/24/2004  . Essential (primary) hypertension 01/16/2004  . Depression 01/15/2002    Past Surgical History:  Procedure Laterality Date  . CESAREAN SECTION  1990  . CHOLECYSTECTOMY  2005  . CYST EXCISION  1996   Tracheal Cyst    Her family history includes Heart attack in her father.   Current Outpatient Medications:  .  Apoaequorin (PREVAGEN PO), Take 1 tablet by mouth daily. , Disp: , Rfl:  .  b complex vitamins capsule, Take 1 capsule by mouth daily., Disp: , Rfl:  .  Bioflavonoid Products (VITAMIN C PLUS)  500 MG TABS, Take 2 tablets by mouth daily. , Disp: , Rfl:  .  Biotin 1000 MCG tablet, Take 1,000 mcg by mouth daily., Disp: , Rfl:  .  Calcium Carbonate-Vit D-Min (CALCIUM 1200 PO), Take by mouth., Disp: , Rfl:  .  cetirizine (ZYRTEC) 10 MG tablet, Take 20 mg by mouth at bedtime., Disp: , Rfl:  .  clobetasol (TEMOVATE) 0.05 % external solution, Apply 1 application topically 2 (two) times daily., Disp: , Rfl:  .  famotidine (PEPCID) 40 MG tablet, Take 40 mg by mouth daily., Disp: , Rfl:  .  fexofenadine (ALLEGRA) 180 MG tablet, Take 180 mg by mouth daily., Disp: , Rfl:  .  glucosamine-chondroitin 500-400 MG tablet, Take 1 tablet by mouth 3 (three) times daily., Disp: , Rfl:  .  irbesartan-hydrochlorothiazide (AVALIDE) 150-12.5 MG tablet, Take 1 tablet by mouth daily., Disp: 90 tablet, Rfl: 4 .  Multiple Vitamin (MULTIVITAMIN) tablet, Take 1 tablet by mouth daily., Disp: , Rfl:  .  Naproxen Sodium (ALEVE) 220 MG CAPS, Take by mouth daily as needed., Disp: , Rfl:  .  Omega 3 1200 MG CAPS, Take 2 capsules by mouth daily., Disp: , Rfl:  .  sertraline (ZOLOFT) 50 MG tablet, TAKE 1 1/2 TABLETS BY MOUTH EVERY MORNING, Disp: 135 tablet, Rfl: 4 .  simvastatin (ZOCOR) 40 MG tablet, Take 1 tablet (40 mg total) by mouth at bedtime., Disp: 90 tablet, Rfl: 4 .  traZODone (DESYREL) 50 MG tablet, TAKE ONE OR TWO TABLETS AT BEDTIME, Disp: 180 tablet, Rfl: 4 .  Ubiquinol 200 MG CAPS, Take by mouth., Disp: , Rfl:  .  montelukast (SINGULAIR) 10 MG tablet, Take 1 tablet (10 mg total) by mouth at bedtime. (Patient not taking: Reported on 11/04/2018), Disp: 30 tablet, Rfl: 5 .  triamcinolone cream (KENALOG) 0.1 %, Apply 1 application topically 2 (two) times daily., Disp: , Rfl:   Current Facility-Administered Medications:  .  omalizumab Geoffry Paradise) injection 300 mg, 300 mg, Subcutaneous, Q28 days, Alfonse Spruce, MD, 300 mg at 10/28/18 1537  Patient Care Team: Malva Limes, MD as PCP - General (Family  Medicine)    Objective:    Vitals: BP 112/62 (BP Location: Left Arm, Patient Position: Sitting, Cuff Size: Large)   Pulse 73   Temp (!) 96.9 F (36.1 C) (Temporal)   Resp 16   Wt 168 lb (76.2 kg)   SpO2 97% Comment: room air  BMI 31.74 kg/m   Physical Exam   General Appearance:    Mildly obese female. Alert, cooperative, in no acute distress, appears stated age   Head:    Normocephalic, without obvious abnormality, atraumatic  Eyes:    PERRL, conjunctiva/corneas clear, EOM's intact, fundi  benign, both eyes  Ears:    Normal TM's and external ear canals, both ears  Nose:   Nares normal, septum midline, mucosa normal, no drainage    or sinus tenderness  Throat:   Lips, mucosa, and tongue normal; teeth and gums normal  Neck:   Supple, symmetrical, trachea midline, no adenopathy;    thyroid:  no enlargement/tenderness/nodules; no carotid   bruit or JVD  Back:     Symmetric, no curvature, ROM normal, no CVA tenderness  Lungs:     Clear to auscultation bilaterally, respirations unlabored  Chest Wall:    No tenderness or deformity   Heart:    Normal heart rate. Normal rhythm. No murmurs, rubs, or gallops.   Breast Exam:    normal appearance, no masses or tenderness  Abdomen:     Soft, non-tender, bowel sounds active all four quadrants,    no masses, no organomegaly  Pelvic:    deferred and not indicated; post-menopausal, no abnormal Pap smears in past  Extremities:   All extremities are intact. No cyanosis or edema  Pulses:   2+ and symmetric all extremities  Skin:   Skin color, texture, turgor normal, no rashes or lesions  Lymph nodes:   Cervical, supraclavicular, and axillary nodes normal  Neurologic:   CNII-XII intact, normal strength, sensation and reflexes    throughout    Activities of Daily Living In your present state of health, do you have any difficulty performing the following activities: 11/04/2018  Hearing? N  Vision? N  Difficulty concentrating or making  decisions? N  Walking or climbing stairs? N  Dressing or bathing? N  Doing errands, shopping? N  Some recent data might be hidden    Fall Risk Assessment Fall Risk  11/04/2018 02/12/2017  Falls in the past year? 0 No  Number falls in past yr: 0 -  Injury with Fall? 0 -  Follow up Falls evaluation completed -     Depression Screen PHQ 2/9 Scores 11/04/2018  PHQ - 2 Score 1  PHQ- 9 Score 4        Assessment & Plan:     1. Annual physical exam   2. Diabetes mellitus without complication (HCC)  - Hemoglobin A1c  3. Insomnia States that 2 trazodone doesn't not always work well, can try taking a third, if not effective consider belsomra or mirtazapine.  - traZODone (DESYREL) 50 MG tablet; Take 2-3 tablets (100-150 mg total) by mouth at bedtime.  4. Encounter for screening for malignant neoplasm of breast, unspecified screening modality   5. Mood disorder (HCC) Still goes through periods of feeling depression. Is now taking 75mg  sertraline daily, can try going up to 2 tablets daily.  - sertraline (ZOLOFT) 50 MG tablet; Take 2 tablets (100 mg total) by mouth daily.; Refill: 0  6. Colon cancer screening  - Cologuard  7. Estrogen deficiency  - DG Bone Density; Future  8. Need for pneumococcal vaccination  - Pneumococcal polysaccharide vaccine 23-valent greater than or equal to 2yo subcutaneous/IM  9. Aortic atherosclerosis (HCC) Asymptomatic. Compliant with medication.  Continue aggressive risk factor modification.    10.  Essential (primary) hypertension Well controlled.  Continue current medications.   - Lipid panel  11. Thrombocytopenia (HCC)  - Hepatitis B vaccine adult IM - Platelet count  12. Chronic ITP (idiopathic thrombocytopenia) (HCC)  13. Need for hepatitis B vaccination  - Hepatitis B vaccine adult IM  14. Fatty liver - Hepatitis B vaccine adult IM  15. Need for influenza vaccination  - Flu Vaccine QUAD High Dose(Fluad)  The  entirety of the information documented in the History of Present Illness, Review of Systems and Physical Exam were personally obtained by me. Portions of this information were initially documented by Awilda Bill, CMA and reviewed by me for thoroughness and accuracy.    Mila Merry, MD  [facility]

## 2018-11-04 NOTE — Progress Notes (Signed)
Patient: Julia Garner, Female    DOB: 09-26-1950, 68 y.o.   MRN: [id number] Visit Date: 11/04/2018  Today's Provider: Lelon Huh, MD   Chief Complaint  Patient presents with  . Medicare Wellness  . Diabetes  . Hypertension  . Hyperlipidemia  . Annual Exam   Subjective:     Annual wellness visit Julia Garner is a 68 y.o. female. She feels fairly well. She reports exercising rarely. She reports she is sleeping poorly.  -----------------------------------------------------------    Social History   Socioeconomic History  . Marital status: Married    Spouse name: Not on file  . Number of children: 3  . Years of education: Not on file  . Highest education level: Not on file  Occupational History  . Occupation: retired    Comment: Community education officer  Social Needs  . Financial resource strain: Not on file  . Food insecurity    Worry: Not on file    Inability: Not on file  . Transportation needs    Medical: Not on file    Non-medical: Not on file  Tobacco Use  . Smoking status: Never Smoker  . Smokeless tobacco: Never Used  Substance and Sexual Activity  . Alcohol use: No    Alcohol/week: 0.0 standard drinks  . Drug use: No  . Sexual activity: Not on file  Lifestyle  . Physical activity    Days per week: Not on file    Minutes per session: Not on file  . Stress: Not on file  Relationships  . Social Herbalist on phone: Not on file    Gets together: Not on file    Attends religious service: Not on file    Active member of club or organization: Not on file    Attends meetings of clubs or organizations: Not on file    Relationship status: Not on file  . Intimate partner violence    Fear of current or ex partner: Not on file    Emotionally abused: Not on file    Physically abused: Not on file    Forced sexual activity: Not on file  Other Topics Concern  . Not on file  Social History Narrative  . Not on file    Past Medical  History:  Diagnosis Date  . Allergy   . Chronic ITP (idiopathic thrombocytopenia) (HCC)   . Chronic urticaria 08/15/2018  . Depression   . Head injuries   . History of seizure 2007   one seizure   . Hypertension      Patient Active Problem List   Diagnosis Date Noted  . Aortic atherosclerosis ([place]) 11/04/2018  . Fatty liver 11/04/2018  . Chronic urticaria 08/15/2018  . Chronic ITP (idiopathic thrombocytopenia) (HCC) 08/15/2018  . Recurrent infections 08/15/2018  . Diabetes mellitus without complication ([place]) 40/98/1191  . Acute ITP ([place]) 09/16/2016  . Allergic rhinitis 06/29/2014  . Insomnia 06/29/2014  . Arthralgia of temporomandibular joint 06/29/2014  . Edema 06/29/2014  . Oral aphthae 07/08/2007  . 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 Arthropathy of hand 01/25/2007  . Fam hx-ischem heart disease 01/21/2007  . Menopausal and postmenopausal disorder 10/08/2005  . Tachycardia 10/08/2005  . Thrombocytopenia ([place]) 10/08/2005  . Obstructive sleep apnea of adult 10/24/2004  . Essential (primary) hypertension 01/16/2004  . Mood disorder ([place]) 01/15/2002    Past Surgical History:  Procedure Laterality Date  . CESAREAN SECTION  1990  . CHOLECYSTECTOMY  2005  . CYST EXCISION  1996   Tracheal Cyst    Her family history includes Heart attack in her father.   Current Outpatient Medications:  .  Apoaequorin (PREVAGEN PO), Take 1 tablet by mouth daily. , Disp: , Rfl:  .  b complex vitamins capsule, Take 1 capsule by mouth daily., Disp: , Rfl:  .  Bioflavonoid Products (VITAMIN C PLUS) 500 MG TABS, Take 2 tablets by mouth daily. , Disp: , Rfl:  .  Biotin 1000 MCG tablet, Take 1,000 mcg by mouth daily., Disp: , Rfl:  .  Calcium Carbonate-Vit D-Min (CALCIUM 1200 PO), Take by mouth., Disp: , Rfl:  .  cetirizine (ZYRTEC) 10 MG tablet, Take 20 mg by mouth at bedtime., Disp: , Rfl:  .  clobetasol (TEMOVATE) 0.05 % external solution, Apply 1 application topically 2 (two) times  daily., Disp: , Rfl:  .  famotidine (PEPCID) 40 MG tablet, Take 40 mg by mouth daily., Disp: , Rfl:  .  fexofenadine (ALLEGRA) 180 MG tablet, Take 180 mg by mouth daily., Disp: , Rfl:  .  glucosamine-chondroitin 500-400 MG tablet, Take 1 tablet by mouth 3 (three) times daily., Disp: , Rfl:  .  irbesartan-hydrochlorothiazide (AVALIDE) 150-12.5 MG tablet, Take 1 tablet by mouth daily., Disp: 90 tablet, Rfl: 4 .  Multiple Vitamin (MULTIVITAMIN) tablet, Take 1 tablet by mouth daily., Disp: , Rfl:  .  Naproxen Sodium (ALEVE) 220 MG CAPS, Take by mouth daily as needed., Disp: , Rfl:  .  Omega 3 1200 MG CAPS, Take 2 capsules by mouth daily., Disp: , Rfl:  .  sertraline (ZOLOFT) 50 MG tablet, Take 2 tablets (100 mg total) by mouth daily., Disp: , Rfl: 0 .  simvastatin (ZOCOR) 40 MG tablet, Take 1 tablet (40 mg total) by mouth at bedtime., Disp: 90 tablet, Rfl: 4 .  traZODone (DESYREL) 50 MG tablet, Take 2-3 tablets (100-150 mg total) by mouth at bedtime., Disp: , Rfl:  .  Ubiquinol 200 MG CAPS, Take by mouth., Disp: , Rfl:  .  montelukast (SINGULAIR) 10 MG tablet, Take 1 tablet (10 mg total) by mouth at bedtime. (Patient not taking: Reported on 11/04/2018), Disp: 30 tablet, Rfl: 5 .  triamcinolone cream (KENALOG) 0.1 %, Apply 1 application topically 2 (two) times daily., Disp: , Rfl:   Current Facility-Administered Medications:  .  omalizumab Geoffry Paradise) injection 300 mg, 300 mg, Subcutaneous, Q28 days, Alfonse Spruce, MD, 300 mg at 10/28/18 1537  Patient Care Team: Malva Limes, MD as PCP - General (Family Medicine) Isla Pence, OD (Optometry)    Objective:    Vitals: BP 112/62 (BP Location: Left Arm, Patient Position: Sitting, Cuff Size: Large)   Pulse 73   Temp (!) 96.9 F (36.1 C) (Temporal)   Resp 16   Wt 168 lb (76.2 kg)   SpO2 97% Comment: room air  BMI 31.74 kg/m   Physical Exam  Activities of Daily Living In your present state of health, do you have any difficulty  performing the following activities: 11/04/2018  Hearing? N  Vision? N  Difficulty concentrating or making decisions? N  Walking or climbing stairs? N  Dressing or bathing? N  Doing errands, shopping? N  Some recent data might be hidden    Fall Risk Assessment Fall Risk  11/04/2018 02/12/2017  Falls in the past year? 0 No  Number falls in past yr: 0 -  Injury with Fall? 0 -  Follow up Falls evaluation completed -     Depression Screen PHQ  2/9 Scores 11/04/2018  PHQ - 2 Score 1  PHQ- 9 Score 4    No flowsheet data found.    Assessment & Plan:     Annual Wellness Visit  Reviewed patient's Family Medical History Reviewed and updated list of patient's medical providers Assessment of cognitive impairment was done Assessed patient's functional ability Established a written schedule for health screening services Health Risk Assessent Completed and Reviewed  Exercise Activities and Dietary recommendations Goals   None     Immunization History  Administered Date(s) Administered  . Fluad Quad(high Dose 65+) 11/04/2018  . Hepatitis B, adult 09/24/2017, 10/30/2017, 11/04/2018  . Influenza, High Dose Seasonal PF 10/30/2017  . Influenza,inj,Quad PF,6+ Mos 12/22/2014, 12/21/2015, 12/27/2016  . Pneumococcal Conjugate-13 10/30/2017  . Pneumococcal Polysaccharide-23 11/04/2018  . Td 05/26/1993  . Tdap 05/08/2011    Health Maintenance  Topic Date Due  . FOOT EXAM  07/18/1962  . OPHTHALMOLOGY EXAM  07/18/1962  . MAMMOGRAM  07/18/2002  . COLONOSCOPY  07/18/2002  . DEXA SCAN  07/17/2017  . HEMOGLOBIN A1C  06/26/2018  . INFLUENZA VACCINE  08/16/2018  . PNA vac Low Risk Adult (2 of 2 - PPSV23) 10/31/2018  . TETANUS/TDAP  05/07/2021  . Hepatitis C Screening  Completed     Discussed health benefits of physical activity, and encouraged her to engage in regular exercise appropriate for her age and condition.     ------------------------------------------------------------------------------------------------------------    Julia Merryonald Debborah Alonge, MD  [facility]

## 2018-11-04 NOTE — Patient Instructions (Addendum)
.   Please review the attached list of medications and notify my office if there are any errors.   . Please bring all of your medications to every appointment so we can make sure that our medication list is the same as yours.    The CDC recommends two doses of Shingrix (the shingles vaccine) separated by 2 to 6 months for adults age 66 years and older. I recommend checking with your insurance plan regarding coverage for this vaccine.   . .Please contact your eyecare professional to schedule a routine eye exam    

## 2018-11-06 ENCOUNTER — Telehealth: Payer: Self-pay | Admitting: Family Medicine

## 2018-11-06 NOTE — Telephone Encounter (Signed)
Pt does not want to do bone density this year.She states she had one in 2019 in [place] and the results were normal

## 2018-11-07 ENCOUNTER — Encounter: Payer: Self-pay | Admitting: Family Medicine

## 2018-11-07 ENCOUNTER — Telehealth (INDEPENDENT_AMBULATORY_CARE_PROVIDER_SITE_OTHER): Payer: Medicare Other

## 2018-11-07 DIAGNOSIS — Z1211 Encounter for screening for malignant neoplasm of colon: Secondary | ICD-10-CM | POA: Diagnosis not present

## 2018-11-07 LAB — IFOBT (OCCULT BLOOD): IFOBT: NEGATIVE

## 2018-11-07 NOTE — Telephone Encounter (Signed)
FYI. Thanks.

## 2018-11-07 NOTE — Telephone Encounter (Signed)
Patient brought in [place] and it was negative. FYI. Results were entered.

## 2018-11-09 ENCOUNTER — Other Ambulatory Visit: Payer: Self-pay | Admitting: Family Medicine

## 2018-11-11 ENCOUNTER — Other Ambulatory Visit: Payer: Self-pay | Admitting: Family Medicine

## 2018-11-11 DIAGNOSIS — I10 Essential (primary) hypertension: Secondary | ICD-10-CM | POA: Diagnosis not present

## 2018-11-11 DIAGNOSIS — E119 Type 2 diabetes mellitus without complications: Secondary | ICD-10-CM | POA: Diagnosis not present

## 2018-11-11 DIAGNOSIS — D699 Hemorrhagic condition, unspecified: Secondary | ICD-10-CM | POA: Diagnosis not present

## 2018-11-12 LAB — LIPID PANEL
Chol/HDL Ratio: 4.5 ratio — ABNORMAL HIGH (ref 0.0–4.4)
Cholesterol, Total: 141 mg/dL (ref 100–199)
HDL: 31 mg/dL — ABNORMAL LOW (ref >39)
LDL Chol Calc (NIH): 71 mg/dL (ref 0–99)
Triglycerides: 236 mg/dL — ABNORMAL HIGH (ref 0–149)
VLDL Cholesterol Cal: 39 mg/dL (ref 5–40)

## 2018-11-12 LAB — HEMOGLOBIN A1C
Est. average glucose Bld gHb Est-mCnc: 140 mg/dL
Hgb A1c MFr Bld: 6.5 % — ABNORMAL HIGH (ref 4.8–5.6)

## 2018-11-12 LAB — PLATELET COUNT: Platelets: 138 10*3/uL — ABNORMAL LOW (ref 150–450)

## 2018-11-25 ENCOUNTER — Other Ambulatory Visit: Payer: Self-pay

## 2018-11-25 ENCOUNTER — Ambulatory Visit (INDEPENDENT_AMBULATORY_CARE_PROVIDER_SITE_OTHER): Payer: Medicare Other | Admitting: *Deleted

## 2018-11-25 DIAGNOSIS — L508 Other urticaria: Secondary | ICD-10-CM

## 2018-11-25 DIAGNOSIS — L501 Idiopathic urticaria: Secondary | ICD-10-CM

## 2018-11-28 ENCOUNTER — Telehealth: Payer: Self-pay | Admitting: *Deleted

## 2018-11-28 NOTE — Telephone Encounter (Signed)
Per Angelica at [facility]. RX was shipped on 11/22/2018. Per [employer] tracking info, package is still in transit as of today it is in [place]. Left patient a message advising that RX is in transit and she should be receiving the medication within the next 1-2 days.

## 2018-11-28 NOTE — Telephone Encounter (Signed)
Patient called concerning rx for Zoloft that was sent 11/11/2018. Patient states [facility] is holding the prescription. There is some issue with the signature or dose, the pt was not clear on which. Patient is request we contact [facility] to see what the hold up is. Please advise?

## 2018-12-02 ENCOUNTER — Other Ambulatory Visit: Payer: Medicare Other

## 2018-12-23 ENCOUNTER — Ambulatory Visit (INDEPENDENT_AMBULATORY_CARE_PROVIDER_SITE_OTHER): Payer: Medicare Other

## 2018-12-23 ENCOUNTER — Other Ambulatory Visit: Payer: Self-pay

## 2018-12-23 DIAGNOSIS — L508 Other urticaria: Secondary | ICD-10-CM

## 2018-12-23 DIAGNOSIS — Z780 Asymptomatic menopausal state: Secondary | ICD-10-CM | POA: Diagnosis not present

## 2018-12-23 DIAGNOSIS — L501 Idiopathic urticaria: Secondary | ICD-10-CM | POA: Diagnosis not present

## 2018-12-23 LAB — HM DEXA SCAN: HM Dexa Scan: NORMAL

## 2019-01-07 ENCOUNTER — Encounter: Payer: Self-pay | Admitting: Family Medicine

## 2019-01-07 ENCOUNTER — Inpatient Hospital Stay: Payer: Medicare Other | Attending: Oncology | Admitting: *Deleted

## 2019-01-07 ENCOUNTER — Other Ambulatory Visit: Payer: Self-pay

## 2019-01-07 DIAGNOSIS — I10 Essential (primary) hypertension: Secondary | ICD-10-CM | POA: Insufficient documentation

## 2019-01-07 DIAGNOSIS — Z79899 Other long term (current) drug therapy: Secondary | ICD-10-CM | POA: Diagnosis not present

## 2019-01-07 DIAGNOSIS — F329 Major depressive disorder, single episode, unspecified: Secondary | ICD-10-CM | POA: Insufficient documentation

## 2019-01-07 DIAGNOSIS — D693 Immune thrombocytopenic purpura: Secondary | ICD-10-CM

## 2019-01-07 DIAGNOSIS — R634 Abnormal weight loss: Secondary | ICD-10-CM | POA: Diagnosis not present

## 2019-01-07 DIAGNOSIS — K760 Fatty (change of) liver, not elsewhere classified: Secondary | ICD-10-CM | POA: Diagnosis not present

## 2019-01-07 DIAGNOSIS — R21 Rash and other nonspecific skin eruption: Secondary | ICD-10-CM

## 2019-01-07 DIAGNOSIS — L508 Other urticaria: Secondary | ICD-10-CM | POA: Insufficient documentation

## 2019-01-07 LAB — CBC WITH DIFFERENTIAL/PLATELET
Abs Immature Granulocytes: 0.06 10*3/uL (ref 0.00–0.07)
Basophils Absolute: 0 10*3/uL (ref 0.0–0.1)
Basophils Relative: 0 %
Eosinophils Absolute: 2.1 10*3/uL — ABNORMAL HIGH (ref 0.0–0.5)
Eosinophils Relative: 18 %
HCT: 44.8 % (ref 36.0–46.0)
Hemoglobin: 14 g/dL (ref 12.0–15.0)
Immature Granulocytes: 1 %
Lymphocytes Relative: 31 %
Lymphs Abs: 3.6 10*3/uL (ref 0.7–4.0)
MCH: 28.1 pg (ref 26.0–34.0)
MCHC: 31.3 g/dL (ref 30.0–36.0)
MCV: 90 fL (ref 80.0–100.0)
Monocytes Absolute: 0.9 10*3/uL (ref 0.1–1.0)
Monocytes Relative: 8 %
Neutro Abs: 5 10*3/uL (ref 1.7–7.7)
Neutrophils Relative %: 42 %
Platelets: 112 10*3/uL — ABNORMAL LOW (ref 150–400)
RBC: 4.98 MIL/uL (ref 3.87–5.11)
RDW: 13.3 % (ref 11.5–15.5)
WBC: 11.8 10*3/uL — ABNORMAL HIGH (ref 4.0–10.5)
nRBC: 0 % (ref 0.0–0.2)

## 2019-01-07 LAB — COMPREHENSIVE METABOLIC PANEL
ALT: 24 U/L (ref 0–44)
AST: 23 U/L (ref 15–41)
Albumin: 3.8 g/dL (ref 3.5–5.0)
Alkaline Phosphatase: 69 U/L (ref 38–126)
Anion gap: 9 (ref 5–15)
BUN: 13 mg/dL (ref 8–23)
CO2: 28 mmol/L (ref 22–32)
Calcium: 9.1 mg/dL (ref 8.9–10.3)
Chloride: 103 mmol/L (ref 98–111)
Creatinine, Ser: 0.72 mg/dL (ref 0.44–1.00)
GFR calc Af Amer: >60 mL/min (ref >60)
GFR calc non Af Amer: >60 mL/min (ref >60)
Glucose, Bld: 130 mg/dL — ABNORMAL HIGH (ref 70–99)
Potassium: 3.4 mmol/L — ABNORMAL LOW (ref 3.5–5.1)
Sodium: 140 mmol/L (ref 135–145)
Total Bilirubin: 0.5 mg/dL (ref 0.3–1.2)
Total Protein: 7.6 g/dL (ref 6.5–8.1)

## 2019-01-12 ENCOUNTER — Encounter: Payer: Self-pay | Admitting: Oncology

## 2019-01-12 ENCOUNTER — Inpatient Hospital Stay: Payer: Medicare Other

## 2019-01-12 ENCOUNTER — Inpatient Hospital Stay (HOSPITAL_BASED_OUTPATIENT_CLINIC_OR_DEPARTMENT_OTHER): Payer: Medicare Other | Admitting: Oncology

## 2019-01-12 DIAGNOSIS — D693 Immune thrombocytopenic purpura: Secondary | ICD-10-CM

## 2019-01-12 DIAGNOSIS — L508 Other urticaria: Secondary | ICD-10-CM

## 2019-01-12 DIAGNOSIS — K760 Fatty (change of) liver, not elsewhere classified: Secondary | ICD-10-CM

## 2019-01-12 DIAGNOSIS — I10 Essential (primary) hypertension: Secondary | ICD-10-CM | POA: Diagnosis not present

## 2019-01-12 DIAGNOSIS — Z79899 Other long term (current) drug therapy: Secondary | ICD-10-CM | POA: Diagnosis not present

## 2019-01-12 NOTE — Progress Notes (Signed)
HEMATOLOGY-ONCOLOGY TeleHEALTH VISIT PROGRESS NOTE  I connected with Julia RoesQurratulain Petersen on 01/12/19 at  1:15 PM EST by video enabled telemedicine visit and verified that I am speaking with the correct person using two identifiers. I discussed the limitations, risks, security and privacy concerns of performing an evaluation and management service by telemedicine and the availability of in-person appointments. I also discussed with the patient that there may be a patient responsible charge related to this service. The patient expressed understanding and agreed to proceed.   Other persons participating in the visit and their role in the encounter:  None  Patient's location: Home  Provider's location: office Chief Complaint: Follow-up for ITP  INTERVAL HISTORY Julia Garner is a 68 y.o. female who has above history reviewed by me today presents for follow up visit for management of ITP Problems and complaints are listed below:  Patient reports doing well at baseline. Denies any bleeding events. Patient was seen by primary care provider Dr. Sherrie MustacheFisher in October 2020.  Note reviewed. She has chronic urticaria and takes suppressive dose of antihistamine.  Has received Xolair She has no new complaints today.   Review of Systems  Constitutional: Negative for appetite change, chills, fatigue and fever.  HENT:   Negative for hearing loss and voice change.   Eyes: Negative for eye problems.  Respiratory: Negative for chest tightness and cough.   Cardiovascular: Negative for chest pain.  Gastrointestinal: Negative for abdominal distention, abdominal pain and blood in stool.  Endocrine: Negative for hot flashes.  Genitourinary: Negative for difficulty urinating and frequency.   Musculoskeletal: Negative for arthralgias.  Skin: Negative for itching and rash.  Neurological: Negative for extremity weakness.  Hematological: Negative for adenopathy.  Psychiatric/Behavioral: Negative for confusion.     Past Medical History:  Diagnosis Date  . Allergy   . Chronic ITP (idiopathic thrombocytopenia) (HCC)   . Chronic urticaria 08/15/2018  . Depression   . Head injuries   . History of seizure 2007   one seizure   . Hypertension    Past Surgical History:  Procedure Laterality Date  . CESAREAN SECTION  1990  . CHOLECYSTECTOMY  2005  . CYST EXCISION  1996   Tracheal Cyst    Family History  Problem Relation Age of Onset  . Heart attack Father        1st MI at age 66    Social History   Socioeconomic History  . Marital status: Married    Spouse name: Not on file  . Number of children: 3  . Years of education: Not on file  . Highest education level: Not on file  Occupational History  . Occupation: retired    Comment: Adult nursehysical Therapist  Tobacco Use  . Smoking status: Never Smoker  . Smokeless tobacco: Never Used  Substance and Sexual Activity  . Alcohol use: No    Alcohol/week: 0.0 standard drinks  . Drug use: No  . Sexual activity: Not on file  Other Topics Concern  . Not on file  Social History Narrative  . Not on file   Social Determinants of Health   Financial Resource Strain:   . Difficulty of Paying Living Expenses: Not on file  Food Insecurity:   . Worried About Programme researcher, broadcasting/film/videounning Out of Food in the Last Year: Not on file  . Ran Out of Food in the Last Year: Not on file  Transportation Needs:   . Lack of Transportation (Medical): Not on file  . Lack of Transportation (Non-Medical): Not on  file  Physical Activity:   . Days of Exercise per Week: Not on file  . Minutes of Exercise per Session: Not on file  Stress:   . Feeling of Stress : Not on file  Social Connections:   . Frequency of Communication with Friends and Family: Not on file  . Frequency of Social Gatherings with Friends and Family: Not on file  . Attends Religious Services: Not on file  . Active Member of Clubs or Organizations: Not on file  . Attends Banker Meetings: Not on file  .  Marital Status: Not on file  Intimate Partner Violence:   . Fear of Current or Ex-Partner: Not on file  . Emotionally Abused: Not on file  . Physically Abused: Not on file  . Sexually Abused: Not on file    Current Outpatient Medications on File Prior to Visit  Medication Sig Dispense Refill  . Apoaequorin (PREVAGEN PO) Take 1 tablet by mouth daily.     Marland Kitchen b complex vitamins capsule Take 1 capsule by mouth daily.    Marland Kitchen Bioflavonoid Products (VITAMIN C PLUS) 500 MG TABS Take 2 tablets by mouth daily.     . Biotin 1000 MCG tablet Take 1,000 mcg by mouth daily.    . Calcium Carbonate-Vit D-Min (CALCIUM 1200 PO) Take by mouth.    . cetirizine (ZYRTEC) 10 MG tablet Take 20 mg by mouth at bedtime.    . clobetasol (TEMOVATE) 0.05 % external solution Apply 1 application topically 2 (two) times daily.    . fexofenadine (ALLEGRA) 180 MG tablet Take 180 mg by mouth daily.    Marland Kitchen glucosamine-chondroitin 500-400 MG tablet Take 1 tablet by mouth 3 (three) times daily.    . irbesartan-hydrochlorothiazide (AVALIDE) 150-12.5 MG tablet Take 1 tablet by mouth daily. 90 tablet 4  . Multiple Vitamin (MULTIVITAMIN) tablet Take 1 tablet by mouth daily.    . Naproxen Sodium (ALEVE) 220 MG CAPS Take by mouth daily as needed.    . Omega 3 1200 MG CAPS Take 2 capsules by mouth daily.    . sertraline (ZOLOFT) 50 MG tablet Take 2 tablets (100 mg total) by mouth daily. PLEASE NOTE CHANGE IN SIG 90 tablet 3  . simvastatin (ZOCOR) 40 MG tablet Take 1 tablet (40 mg total) by mouth at bedtime. 90 tablet 4  . traZODone (DESYREL) 50 MG tablet Take 2-3 tablets (100-150 mg total) by mouth at bedtime.    . triamcinolone cream (KENALOG) 0.1 % Apply 1 application topically 2 (two) times daily.    Marland Kitchen Ubiquinol 200 MG CAPS Take by mouth.    . famotidine (PEPCID) 40 MG tablet Take 40 mg by mouth daily.    . montelukast (SINGULAIR) 10 MG tablet Take 1 tablet (10 mg total) by mouth at bedtime. (Patient not taking: Reported on 11/04/2018)  30 tablet 5  . traZODone (DESYREL) 50 MG tablet TAKE 1 TO 2 TABLETS BY  MOUTH AT BEDTIME (Patient not taking: Reported on 01/12/2019) 180 tablet 3   Current Facility-Administered Medications on File Prior to Visit  Medication Dose Route Frequency Provider Last Rate Last Admin  . omalizumab Geoffry Paradise) injection 300 mg  300 mg Subcutaneous Q28 days Alfonse Spruce, MD   300 mg at 12/23/18 1451    Allergies  Allergen Reactions  . Niacin And Related     As component of simcor (flushing)  . Penicillins Itching    Hot flashes.  . Tylenol  [Acetaminophen]  Observations/Objective: Today's Vitals   01/12/19 1205  PainSc: 0-No pain   There is no height or weight on file to calculate BMI.  Physical Exam  Constitutional: No distress.  Neurological: She is alert.  Psychiatric: Mood normal.    CBC    Component Value Date/Time   WBC 11.8 (H) 01/07/2019 1355   RBC 4.98 01/07/2019 1355   HGB 14.0 01/07/2019 1355   HGB 14.6 08/27/2016 0827   HCT 44.8 01/07/2019 1355   HCT 43.1 08/27/2016 0827   PLT 112 (L) 01/07/2019 1355   PLT 138 (L) 11/11/2018 0838   MCV 90.0 01/07/2019 1355   MCV 85 08/27/2016 0827   MCH 28.1 01/07/2019 1355   MCHC 31.3 01/07/2019 1355   RDW 13.3 01/07/2019 1355   RDW 13.0 08/27/2016 0827   LYMPHSABS 3.6 01/07/2019 1355   MONOABS 0.9 01/07/2019 1355   EOSABS 2.1 (H) 01/07/2019 1355   BASOSABS 0.0 01/07/2019 1355    CMP     Component Value Date/Time   NA 140 01/07/2019 1355   NA 143 07/03/2018 1602   K 3.4 (L) 01/07/2019 1355   CL 103 01/07/2019 1355   CO2 28 01/07/2019 1355   GLUCOSE 130 (H) 01/07/2019 1355   BUN 13 01/07/2019 1355   BUN 14 07/03/2018 1602   CREATININE 0.72 01/07/2019 1355   CALCIUM 9.1 01/07/2019 1355   PROT 7.6 01/07/2019 1355   PROT 7.1 07/03/2018 1602   ALBUMIN 3.8 01/07/2019 1355   ALBUMIN 4.1 07/03/2018 1602   AST 23 01/07/2019 1355   ALT 24 01/07/2019 1355   ALKPHOS 69 01/07/2019 1355   BILITOT 0.5 01/07/2019  1355   BILITOT 0.4 07/03/2018 1602   GFRNONAA >60 01/07/2019 1355   GFRAA >60 01/07/2019 1355     Assessment and Plan: 1. Chronic ITP (idiopathic thrombocytopenia) (HCC)   2. Fatty liver disease, nonalcoholic   3. Chronic urticaria     # ITP, s/p Rituximab  Labs reviewed discussed with patient. Platelet count was 1 12,000, slightly decreased compared to last visit. She is clinically asymptomatic.  Continue to monitor.  Discussed with patient and she agrees with the plan.  #Fatty liver disease, continue follow-up with Dr. Reece Levy with gastroenterology.  Weight loss, healthy diet and exercise. #Chronic urticaria, follow-up with immunologist.  She has received her Xolair  Follow Up Instructions: 3 months with repeat   I discussed the assessment and treatment plan with the patient. The patient was provided an opportunity to ask questions and all were answered. The patient agreed with the plan and demonstrated an understanding of the instructions.  The patient was advised to call back or seek an in-person evaluation if the symptoms worsen or if the condition fails to improve as anticipated.    Earlie Server, MD 01/12/2019 5:38 PM

## 2019-01-20 ENCOUNTER — Ambulatory Visit: Payer: Self-pay

## 2019-01-20 ENCOUNTER — Ambulatory Visit (INDEPENDENT_AMBULATORY_CARE_PROVIDER_SITE_OTHER): Payer: Medicare Other

## 2019-01-20 ENCOUNTER — Other Ambulatory Visit: Payer: Self-pay

## 2019-01-20 DIAGNOSIS — L508 Other urticaria: Secondary | ICD-10-CM

## 2019-01-20 DIAGNOSIS — L501 Idiopathic urticaria: Secondary | ICD-10-CM

## 2019-02-16 LAB — HM DIABETES EYE EXAM

## 2019-02-17 ENCOUNTER — Ambulatory Visit (INDEPENDENT_AMBULATORY_CARE_PROVIDER_SITE_OTHER): Payer: Medicare Other

## 2019-02-17 DIAGNOSIS — L501 Idiopathic urticaria: Secondary | ICD-10-CM | POA: Diagnosis not present

## 2019-02-19 ENCOUNTER — Other Ambulatory Visit: Payer: Self-pay | Admitting: Family Medicine

## 2019-02-23 ENCOUNTER — Ambulatory Visit: Payer: Medicare Other

## 2019-03-17 ENCOUNTER — Ambulatory Visit (INDEPENDENT_AMBULATORY_CARE_PROVIDER_SITE_OTHER): Payer: Medicare Other

## 2019-03-17 ENCOUNTER — Other Ambulatory Visit: Payer: Self-pay

## 2019-03-17 DIAGNOSIS — L501 Idiopathic urticaria: Secondary | ICD-10-CM

## 2019-03-17 DIAGNOSIS — L508 Other urticaria: Secondary | ICD-10-CM

## 2019-04-13 ENCOUNTER — Inpatient Hospital Stay: Payer: Medicare Other | Attending: Oncology

## 2019-04-13 DIAGNOSIS — D693 Immune thrombocytopenic purpura: Secondary | ICD-10-CM

## 2019-04-13 LAB — CBC WITH DIFFERENTIAL/PLATELET
Abs Immature Granulocytes: 0.02 10*3/uL (ref 0.00–0.07)
Basophils Absolute: 0.1 10*3/uL (ref 0.0–0.1)
Basophils Relative: 1 %
Eosinophils Absolute: 0.7 10*3/uL — ABNORMAL HIGH (ref 0.0–0.5)
Eosinophils Relative: 8 %
HCT: 42.5 % (ref 36.0–46.0)
Hemoglobin: 14 g/dL (ref 12.0–15.0)
Immature Granulocytes: 0 %
Lymphocytes Relative: 37 %
Lymphs Abs: 3.2 10*3/uL (ref 0.7–4.0)
MCH: 28.4 pg (ref 26.0–34.0)
MCHC: 32.9 g/dL (ref 30.0–36.0)
MCV: 86.2 fL (ref 80.0–100.0)
Monocytes Absolute: 0.7 10*3/uL (ref 0.1–1.0)
Monocytes Relative: 8 %
Neutro Abs: 4.2 10*3/uL (ref 1.7–7.7)
Neutrophils Relative %: 46 %
Platelets: 111 10*3/uL — ABNORMAL LOW (ref 150–400)
RBC: 4.93 MIL/uL (ref 3.87–5.11)
RDW: 13.2 % (ref 11.5–15.5)
WBC: 8.8 10*3/uL (ref 4.0–10.5)
nRBC: 0 % (ref 0.0–0.2)

## 2019-04-13 LAB — COMPREHENSIVE METABOLIC PANEL
ALT: 33 U/L (ref 0–44)
AST: 26 U/L (ref 15–41)
Albumin: 3.7 g/dL (ref 3.5–5.0)
Alkaline Phosphatase: 55 U/L (ref 38–126)
Anion gap: 7 (ref 5–15)
BUN: 14 mg/dL (ref 8–23)
CO2: 27 mmol/L (ref 22–32)
Calcium: 9.1 mg/dL (ref 8.9–10.3)
Chloride: 102 mmol/L (ref 98–111)
Creatinine, Ser: 0.66 mg/dL (ref 0.44–1.00)
GFR calc Af Amer: >60 mL/min (ref >60)
GFR calc non Af Amer: >60 mL/min (ref >60)
Glucose, Bld: 111 mg/dL — ABNORMAL HIGH (ref 70–99)
Potassium: 3.4 mmol/L — ABNORMAL LOW (ref 3.5–5.1)
Sodium: 136 mmol/L (ref 135–145)
Total Bilirubin: 0.5 mg/dL (ref 0.3–1.2)
Total Protein: 7.8 g/dL (ref 6.5–8.1)

## 2019-04-14 ENCOUNTER — Encounter: Payer: Self-pay | Admitting: Oncology

## 2019-04-14 ENCOUNTER — Other Ambulatory Visit: Payer: Self-pay

## 2019-04-14 ENCOUNTER — Ambulatory Visit (INDEPENDENT_AMBULATORY_CARE_PROVIDER_SITE_OTHER): Payer: Medicare Other

## 2019-04-14 DIAGNOSIS — L501 Idiopathic urticaria: Secondary | ICD-10-CM

## 2019-04-14 DIAGNOSIS — L508 Other urticaria: Secondary | ICD-10-CM

## 2019-04-14 DIAGNOSIS — D693 Immune thrombocytopenic purpura: Secondary | ICD-10-CM

## 2019-04-14 NOTE — Telephone Encounter (Signed)
Done... Lab/MD in 3 mths per Dr.Yu Pt is aware of the scheduled 07/14/19 appt

## 2019-04-15 ENCOUNTER — Inpatient Hospital Stay: Payer: Medicare Other | Admitting: Oncology

## 2019-05-04 NOTE — Progress Notes (Signed)
I,Roshena L Chambers,acting as a scribe for Mila Merry, MD.,have documented all relevant documentation on the behalf of Mila Merry, MD,as directed by  Mila Merry, MD while in the presence of Mila Merry, MD.  Established patient visit    Patient: Julia Garner   DOB: [birth year]   69 y.o. Female  MRN: [id number] Visit Date: 05/05/2019  Today's healthcare provider: Mila Merry, MD   Chief Complaint  Patient presents with  . Diabetes  . Hypertension  . Hyperlipidemia   Subjective    HPI Diabetes Mellitus Type II, follow-up  Lab Results  Component Value Date   HGBA1C 6.5 (H) 11/11/2018   HGBA1C 6.8 (A) 12/25/2017   HGBA1C 6.5 (H) 09/13/2017   Last seen for diabetes 6 months ago.  Management since then includes continuing the same treatment. She reports good compliance with treatment. She is not having side effects.   Home blood sugar records: blood sugars are not checked  Episodes of hypoglycemia? No    Current insulin regiment: None Most Recent Eye Exam: 02/2019  ----------------------------------------------------------------------------------------- Hypertension, follow-up  BP Readings from Last 3 Encounters:  11/04/18 112/62  08/14/18 134/76  07/14/18 (!) 144/83   She was last seen for hypertension 6 months ago.  BP at that visit was 112/62. Management since that visit includes no change. She reports good compliance with treatment. She is not having side effects.  She is exercising. She is adherent to low salt diet.   Outside blood pressures are checked occasionally.  She does not smoke.  Use of agents associated with hypertension: none.   ----------------------------------------------------------------------------------------- Lipid/Cholesterol, follow-up  Last Lipid Panel: Lab Results  Component Value Date   CHOL 141 11/11/2018   LDLCALC 71 11/11/2018   HDL 31 (L) 11/11/2018   TRIG 236 (H) 11/11/2018   ALT 33 04/13/2019   AST 26 04/13/2019   PLT 111 (L) 04/13/2019    She was last seen for this 6 months ago.  Management since that visit includes no change.  She reports good compliance with treatment. She is not having side effects.   Symptoms: No appetite changes No foot ulcerations No chest pain No chest pressure/discomfort No dyspnea No fatigue No lower extremity edema No nausea No numbness or tingling of extremity No orthopnea No palpitations No paroxysmal nocturnal dyspnea No polydipsia No polyuria No speech difficulty No syncope No visual disturbances   She is following a Regular diet. Current exercise: walking  Wt Readings from Last 3 Encounters:  11/04/18 168 lb (76.2 kg)  07/14/18 167 lb 12.8 oz (76.1 kg)  07/03/18 168 lb (76.2 kg)   Last metabolic panel Lab Results  Component Value Date   GLUCOSE 111 (H) 04/13/2019   NA 136 04/13/2019   K 3.4 (L) 04/13/2019   CL 102 04/13/2019   CO2 27 04/13/2019   BUN 14 04/13/2019   CREATININE 0.66 04/13/2019   GFRNONAA >60 04/13/2019   GFRAA >60 04/13/2019   CALCIUM 9.1 04/13/2019   PROT 7.8 04/13/2019   ALBUMIN 3.7 04/13/2019   LABGLOB 3.0 07/03/2018   AGRATIO 1.4 07/03/2018   BILITOT 0.5 04/13/2019   ALKPHOS 55 04/13/2019   AST 26 04/13/2019   ALT 33 04/13/2019   ANIONGAP 7 04/13/2019   The 10-year ASCVD risk score Denman George DC Jr., et al., 2013) is: 12.6%  ----------------------------------------------------------------------------------------- Follow up for Mood Disorder:   The patient was last seen for this 6 months ago. Changes made at last visit include patient advised to try increasing  Sertraline to two 50 mg tablets daily.  She reports good compliance with treatment. She feels that condition is Unchanged. She is not having side effects.   ----------------------------------------------------------------------------------------- Follow up for Insomnia:  The patient was last seen for this 6 months ago. Changes  made at last visit include patient advised she can try taking 3 Trazodone 50 mg tablets at bedtime PRN.   She reports good compliance with treatment. She feels that condition is Improved. She is not having side effects.   -----------------------------------------------------------------------------------------      Medications: Outpatient Medications Prior to Visit  Medication Sig  . Apoaequorin (PREVAGEN PO) Take 1 tablet by mouth daily.   Marland Kitchen b complex vitamins capsule Take 1 capsule by mouth daily.  Marland Kitchen Bioflavonoid Products (VITAMIN C PLUS) 500 MG TABS Take 2 tablets by mouth daily.   . Biotin 1000 MCG tablet Take 1,000 mcg by mouth daily.  . Calcium Carbonate-Vit D-Min (CALCIUM 1200 PO) Take by mouth.  . cetirizine (ZYRTEC) 10 MG tablet Take 20 mg by mouth at bedtime.  . clobetasol (TEMOVATE) 0.05 % external solution Apply 1 application topically 2 (two) times daily.  . famotidine (PEPCID) 40 MG tablet Take 40 mg by mouth daily.  . fexofenadine (ALLEGRA) 180 MG tablet Take 180 mg by mouth daily.  Marland Kitchen glucosamine-chondroitin 500-400 MG tablet Take 1 tablet by mouth 3 (three) times daily.  . irbesartan-hydrochlorothiazide (AVALIDE) 150-12.5 MG tablet Take 1 tablet by mouth daily.  . montelukast (SINGULAIR) 10 MG tablet Take 1 tablet (10 mg total) by mouth at bedtime.  . Multiple Vitamin (MULTIVITAMIN) tablet Take 1 tablet by mouth daily.  . Naproxen Sodium (ALEVE) 220 MG CAPS Take by mouth daily as needed.  . Omega 3 1200 MG CAPS Take 2 capsules by mouth daily.  . sertraline (ZOLOFT) 50 MG tablet TAKE 2 TABLETS BY MOUTH  DAILY  . simvastatin (ZOCOR) 40 MG tablet Take 1 tablet (40 mg total) by mouth at bedtime.  . traZODone (DESYREL) 50 MG tablet Take 2-3 tablets (100-150 mg total) by mouth at bedtime.  . triamcinolone cream (KENALOG) 0.1 % Apply 1 application topically 2 (two) times daily.  Marland Kitchen Ubiquinol 200 MG CAPS Take by mouth.  . [DISCONTINUED] traZODone (DESYREL) 50 MG tablet TAKE 1  TO 2 TABLETS BY  MOUTH AT BEDTIME   Facility-Administered Medications Prior to Visit  Medication Dose Route Frequency Provider  . omalizumab Arvid Right) injection 300 mg  300 mg Subcutaneous Q28 days Valentina Shaggy, MD    Review of Systems  Constitutional: Negative.   Respiratory: Negative.   Cardiovascular: Negative.   Musculoskeletal: Negative.   Psychiatric/Behavioral: Positive for sleep disturbance.       Objective    There were no vitals taken for this visit.   Physical Exam   General: Appearance:    Well developed, well nourished female in no acute distress  Eyes:    PERRL, conjunctiva/corneas clear, EOM's intact       Lungs:     Clear to auscultation bilaterally, respirations unlabored  Heart:    Normal heart rate. Normal rhythm. No murmurs, rubs, or gallops.   MS:   All extremities are intact.   Neurologic:   Awake, alert, oriented x 3. No apparent focal neurological           defect.        Results for orders placed or performed in visit on 05/05/19  POCT HgB A1C  Result Value Ref Range   Hemoglobin A1C 6.5 (  A) 4.0 - 5.6 %   Est. average glucose Bld gHb Est-mCnc 140      Assessment & Plan    1. Diabetes mellitus without complication (HCC) Controlled. Continue current medication.  - POCT HgB A1C  2. Mood disorder (HCC) Doing well on current dose of sertraline and trazodone.   3. Aortic atherosclerosis (HCC) Asymptomatic. Compliant with medication.  Continue aggressive risk factor modification.  Continue statin  4. Thrombocytopenia (HCC) Managed by hematology.   Return in about 6 months (around 11/04/2019) for Diabetes Follow up.      The entirety of the information documented in the History of Present Illness, Review of Systems and Physical Exam were personally obtained by me. Portions of this information were initially documented by the CMA and reviewed by me for thoroughness and accuracy.      Mila Merry, MD  [facility] [phone] (phone) [phone] (fax)  [facility]

## 2019-05-04 NOTE — Progress Notes (Deleted)
Established patient visit    Patient: Julia Garner   DOB: March 14, 1950   69 y.o. Female  MRN: [id number] Visit Date: 05/04/2019  Today's healthcare provider: Mila Merry, MD   No chief complaint on file.  Subjective    HPI  Insomnia Follow up  She presents today for follow up of insomnia. She was last seen for this 6 months ago. Management changes included Trazodone 50 MG tablet changes to take 2-3 tablets (100-150 mg total) by mouth at bedtime. She {is/is not:21021397} having adverse reaction from treatment. *** Insomnia is getting {DESC; BETTER/WORSE:18575}. She {HAS/DOES NOT DGUY:40347} difficulty FALLING asleep. She {HAS/DOES NOT QQVZ:56387} difficulty STAYING asleep.  She {is/is not:21021397} taking stimulant medications. *** She {is/is not:21021397} taking new medications:***  She {is/is not:21021397} taking OTC sleeping aid. . She {is/is not:21021397} taking medications to help sleep. She {is/is not:21021397} drinking alcohol to help sleep. She {is/is not:21021397} using illicit drugs.  --------------------------------------------------------------------    Hypertension, follow-up  BP Readings from Last 3 Encounters:  11/04/18 112/62  08/14/18 134/76  07/14/18 (!) 144/83   She was last seen for hypertension 6 months ago.  BP at that visit was 112/62. Management since that visit includes no changes. She reports {excellent/good/fair/poor:19665} compliance with treatment. She {is/is not:9024} having side effects. {document side effects if present:1} She {is/is not:9024} exercising. She {is/is not:9024} adherent to low salt diet.   Outside blood pressures are {enter patient reported home BP, or 'not being checked':1}.  She {does/does not:200015} smoke.  Use of agents associated with hypertension: {bp agents assoc with hypertension:511::"none"}.   -----------------------------------------------------------------------------------------  Lipid/Cholesterol, follow-up  Last Lipid Panel: Lab Results  Component Value Date   CHOL 141 11/11/2018   LDLCALC 71 11/11/2018   HDL 31 (L) 11/11/2018   TRIG 236 (H) 11/11/2018   ALT 33 04/13/2019   AST 26 04/13/2019   PLT 111 (L) 04/13/2019    She was last seen for this 6 months ago.  Management since that visit includes no chnages.  She reports {excellent/good/fair/poor:19665} compliance with treatment. She {is/is not:9024} having side effects. {document side effects if present:1}  Symptoms: {Yes/No:20286} appetite changes {Yes/No:20286} foot ulcerations {Yes/No:20286} chest pain {Yes/No:20286} chest pressure/discomfort {Yes/No:20286} dyspnea {Yes/No:20286} fatigue {Yes/No:20286} lower extremity edema {Yes/No:20286} nausea {Yes/No:20286} numbness or tingling of extremity {Yes/No:20286} orthopnea {Yes/No:20286} palpitations {Yes/No:20286} paroxysmal nocturnal dyspnea {Yes/No:20286} polydipsia {Yes/No:20286} polyuria {Yes/No:20286} speech difficulty {Yes/No:20286} syncope {Yes/No:20286} visual disturbances   She is following a {diet:21022986} diet. Current exercise: {exercise types:16438}  Wt Readings from Last 3 Encounters:  11/04/18 168 lb (76.2 kg)  07/14/18 167 lb 12.8 oz (76.1 kg)  07/03/18 168 lb (76.2 kg)   Last metabolic panel Lab Results  Component Value Date   GLUCOSE 111 (H) 04/13/2019   NA 136 04/13/2019   K 3.4 (L) 04/13/2019   CL 102 04/13/2019   CO2 27 04/13/2019   BUN 14 04/13/2019   CREATININE 0.66 04/13/2019   GFRNONAA >60 04/13/2019   GFRAA >60 04/13/2019   CALCIUM 9.1 04/13/2019   PROT 7.8 04/13/2019   ALBUMIN 3.7 04/13/2019   LABGLOB 3.0 07/03/2018   AGRATIO 1.4 07/03/2018   BILITOT 0.5 04/13/2019   ALKPHOS 55 04/13/2019   AST 26 04/13/2019   ALT 33 04/13/2019   ANIONGAP 7 04/13/2019   The 10-year ASCVD risk score Denman Julia DC Jr., et al., 2013) is: 12.6%   -----------------------------------------------------------------------------------------    {Show patient history (optional):23778::" "}   Medications: Outpatient Medications Prior to Visit  Medication Sig  . Apoaequorin (PREVAGEN  PO) Take 1 tablet by mouth daily.   Marland Kitchen b complex vitamins capsule Take 1 capsule by mouth daily.  Marland Kitchen Bioflavonoid Products (VITAMIN C PLUS) 500 MG TABS Take 2 tablets by mouth daily.   . Biotin 1000 MCG tablet Take 1,000 mcg by mouth daily.  . Calcium Carbonate-Vit D-Min (CALCIUM 1200 PO) Take by mouth.  . cetirizine (ZYRTEC) 10 MG tablet Take 20 mg by mouth at bedtime.  . clobetasol (TEMOVATE) 0.05 % external solution Apply 1 application topically 2 (two) times daily.  . famotidine (PEPCID) 40 MG tablet Take 40 mg by mouth daily.  . fexofenadine (ALLEGRA) 180 MG tablet Take 180 mg by mouth daily.  Marland Kitchen glucosamine-chondroitin 500-400 MG tablet Take 1 tablet by mouth 3 (three) times daily.  . irbesartan-hydrochlorothiazide (AVALIDE) 150-12.5 MG tablet Take 1 tablet by mouth daily.  . montelukast (SINGULAIR) 10 MG tablet Take 1 tablet (10 mg total) by mouth at bedtime. (Patient not taking: Reported on 11/04/2018)  . Multiple Vitamin (MULTIVITAMIN) tablet Take 1 tablet by mouth daily.  . Naproxen Sodium (ALEVE) 220 MG CAPS Take by mouth daily as needed.  . Omega 3 1200 MG CAPS Take 2 capsules by mouth daily.  . sertraline (ZOLOFT) 50 MG tablet TAKE 2 TABLETS BY MOUTH  DAILY  . simvastatin (ZOCOR) 40 MG tablet Take 1 tablet (40 mg total) by mouth at bedtime.  . traZODone (DESYREL) 50 MG tablet TAKE 1 TO 2 TABLETS BY  MOUTH AT BEDTIME (Patient not taking: Reported on 01/12/2019)  . traZODone (DESYREL) 50 MG tablet Take 2-3 tablets (100-150 mg total) by mouth at bedtime.  . triamcinolone cream (KENALOG) 0.1 % Apply 1 application topically 2 (two) times daily.  Marland Kitchen Ubiquinol 200 MG CAPS Take by mouth.   Facility-Administered Medications Prior to Visit  Medication Dose  Route Frequency Provider  . omalizumab Arvid Right) injection 300 mg  300 mg Subcutaneous Q28 days Valentina Shaggy, MD    Review of Systems  Constitutional: Negative.   Respiratory: Negative.   Cardiovascular: Negative.   Hematological: Negative.   Psychiatric/Behavioral: Positive for sleep disturbance.    {Show previous labs (optional):23779::" "}   Objective    There were no vitals taken for this visit. {Show previous vital signs (optional):23777::" "}  Physical Exam  ***  No results found for any visits on 05/05/19.   Assessment & Plan    ***  No follow-ups on file.      {provider attestation***:1}   Lelon Huh, MD  [facility] [phone] (phone) [phone] (fax)  [place]

## 2019-05-05 ENCOUNTER — Other Ambulatory Visit: Payer: Self-pay

## 2019-05-05 ENCOUNTER — Ambulatory Visit (INDEPENDENT_AMBULATORY_CARE_PROVIDER_SITE_OTHER): Payer: Medicare Other | Admitting: Family Medicine

## 2019-05-05 ENCOUNTER — Encounter: Payer: Self-pay | Admitting: Family Medicine

## 2019-05-05 VITALS — BP 114/68 | HR 75 | Temp 96.9°F | Resp 16 | Wt 163.0 lb

## 2019-05-05 DIAGNOSIS — E119 Type 2 diabetes mellitus without complications: Secondary | ICD-10-CM

## 2019-05-05 DIAGNOSIS — D696 Thrombocytopenia, unspecified: Secondary | ICD-10-CM

## 2019-05-05 DIAGNOSIS — I700 Atherosclerosis of aorta: Secondary | ICD-10-CM

## 2019-05-05 DIAGNOSIS — F39 Unspecified mood [affective] disorder: Secondary | ICD-10-CM | POA: Diagnosis not present

## 2019-05-05 LAB — POCT GLYCOSYLATED HEMOGLOBIN (HGB A1C)
Est. average glucose Bld gHb Est-mCnc: 140
Hemoglobin A1C: 6.5 % — AB (ref 4.0–5.6)

## 2019-05-05 IMAGING — CT CT ABD-PELV W/ CM
3 of 5 series · 15 of 46 positions shown, 17 images · IV contrast (iopamidol)
Comparison: Multiple exams, including 10/25/2016

CLINICAL DATA: Thrombocytopenia.  Fever and malaise.

EXAM:
CT CHEST, ABDOMEN, AND PELVIS WITH CONTRAST
TECHNIQUE: Multidetector CT imaging of the chest, abdomen and pelvis was
performed following the standard protocol during bolus
administration of intravenous contrast.
CONTRAST:  100mL [id number] IOPAMIDOL ([id number]) INJECTION 61%

[Series 2: cap with · axial · 0.74mm/px · z∈[-811,-296]mm · 10 of 127 slices shown, 12 images]
[im 12/127  soft-tissue]
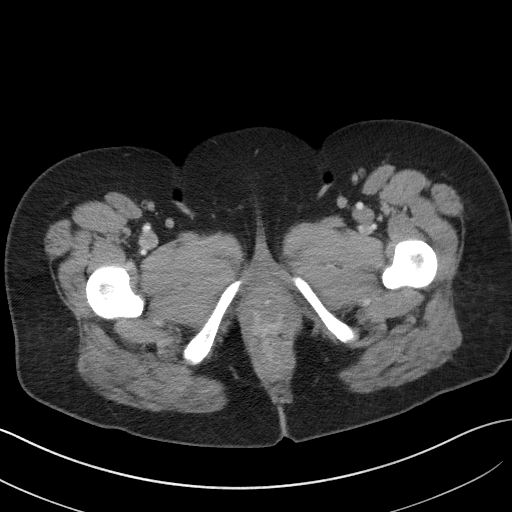
[im 12/127  bone]
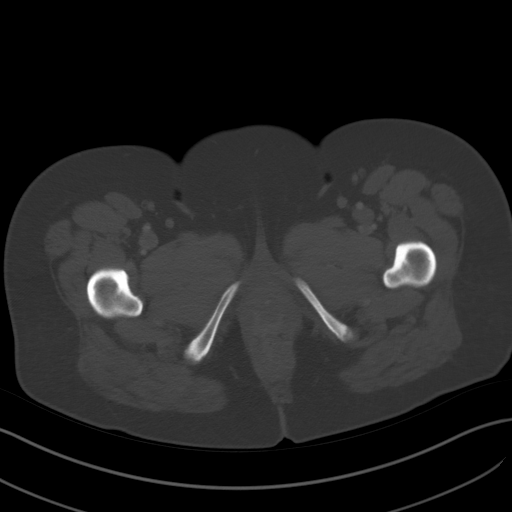
[im 23/127  soft-tissue]
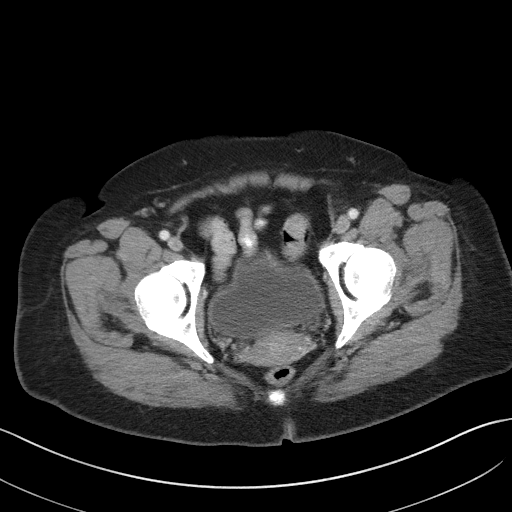
[im 35/127  soft-tissue]
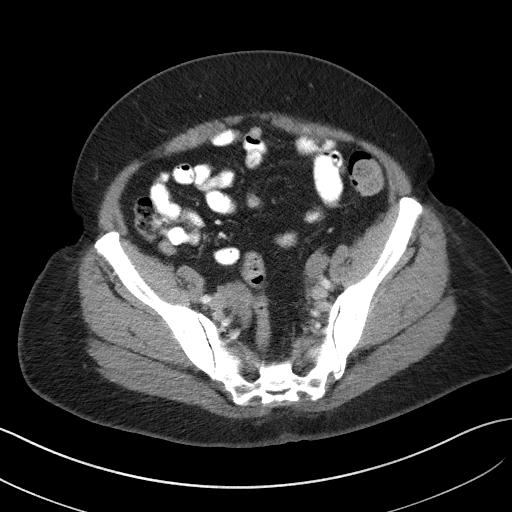
[im 46/127  soft-tissue]
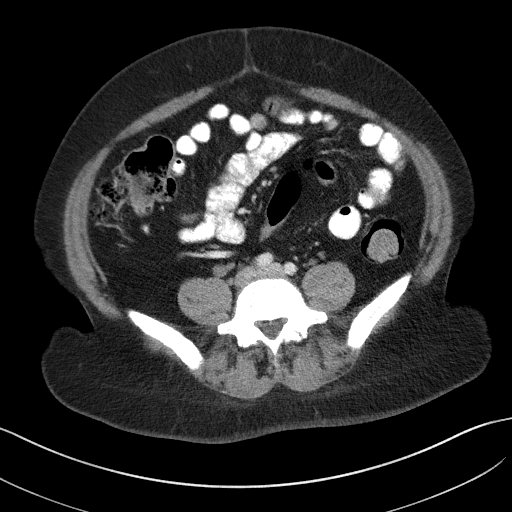
[im 58/127  soft-tissue]
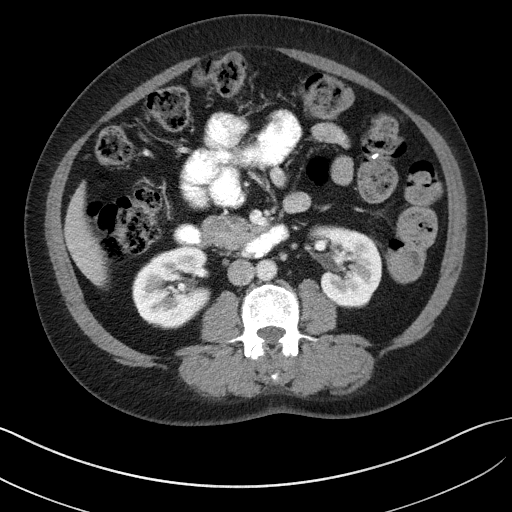
[im 69/127  soft-tissue]
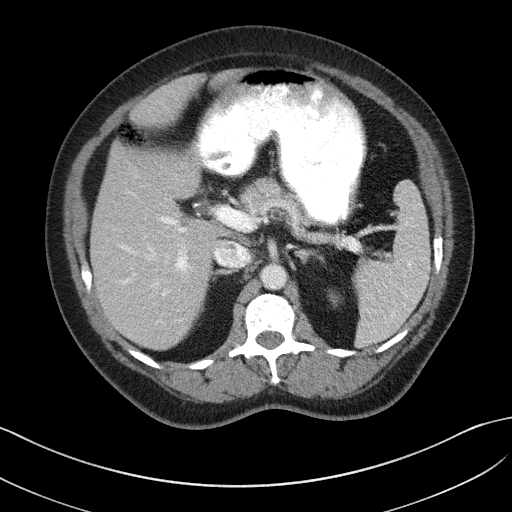
[im 81/127  soft-tissue]
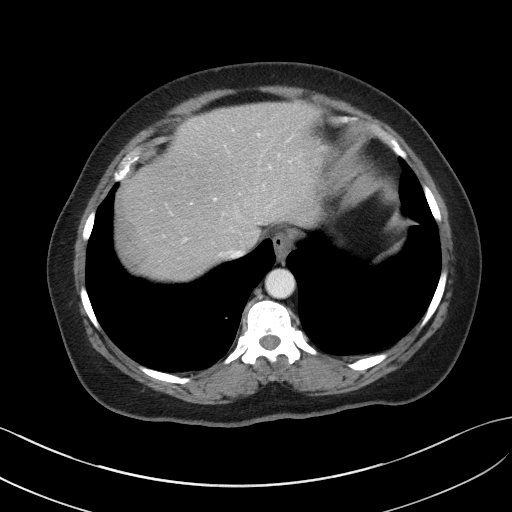
[im 92/127  soft-tissue]
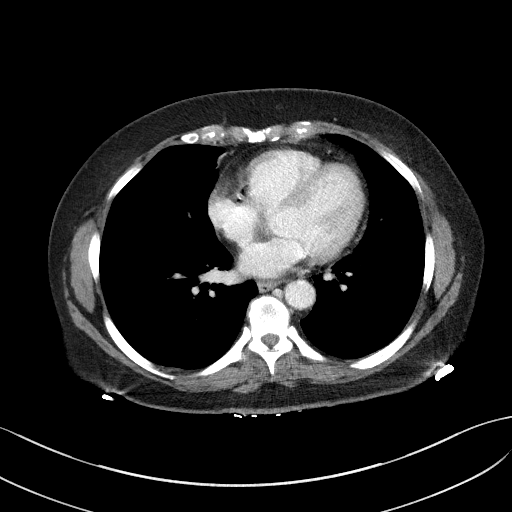
[im 104/127  soft-tissue]
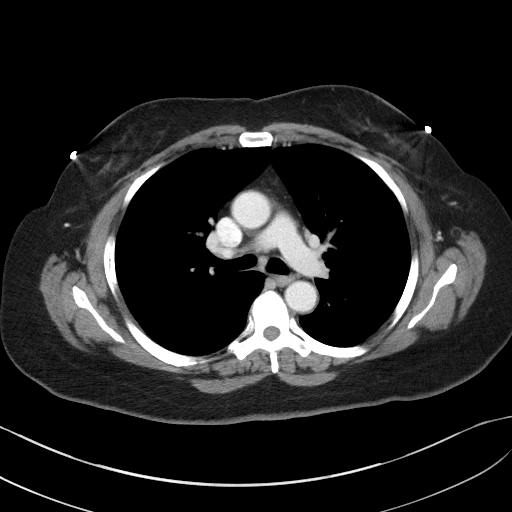
[im 104/127  bone]
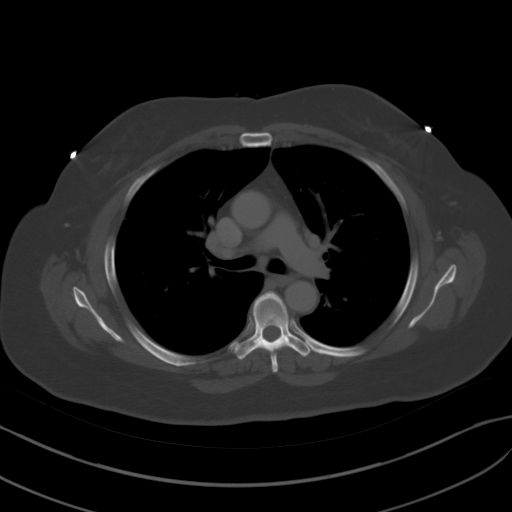
[im 115/127  soft-tissue]
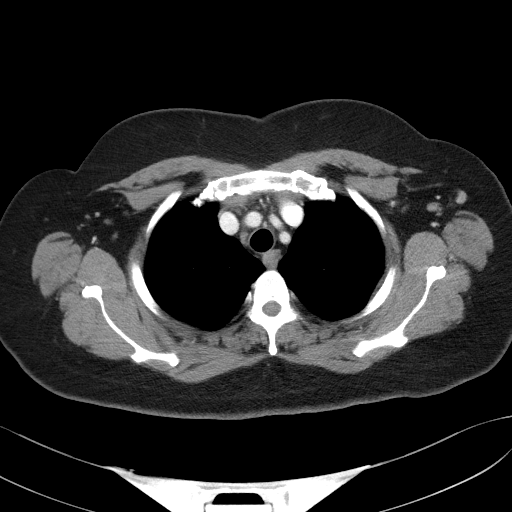

[Series 4: lung · axial · 0.74mm/px · z∈[-554,-512]mm · 2 of 170 slices shown]
[im 11/170  bone]
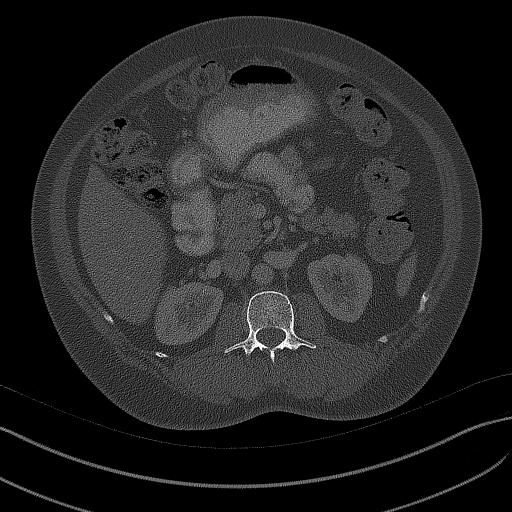
[im 32/170  bone]
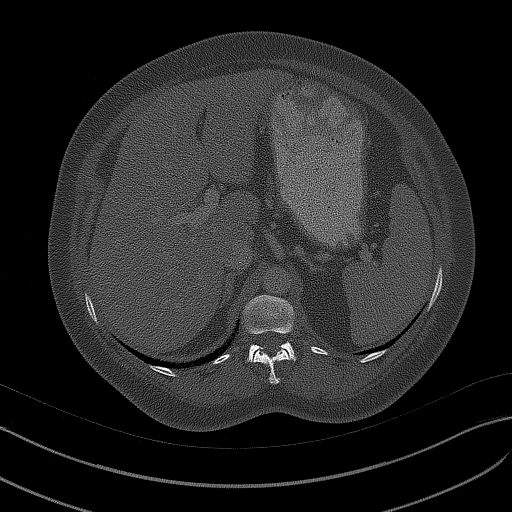

[Series 5: coronals · coronal · 0.76mm/px · 3 of 154 slices shown]
[im 52/154  soft-tissue]
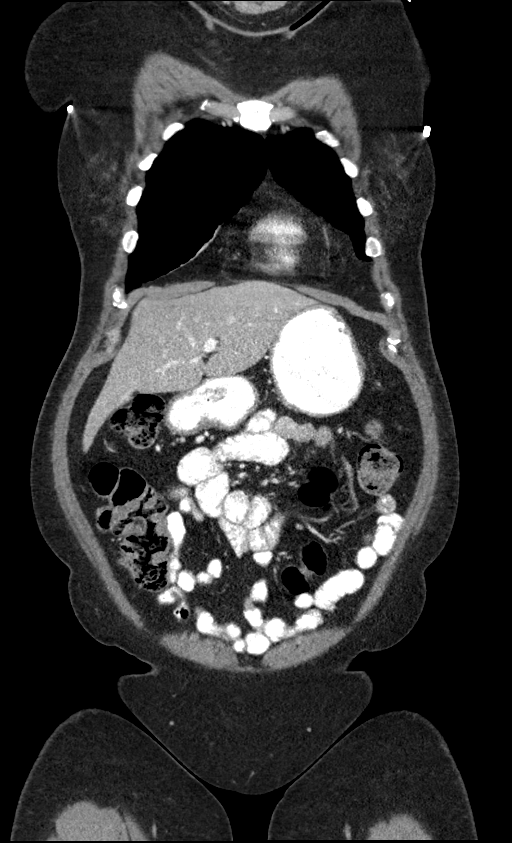
[im 69/154  soft-tissue]
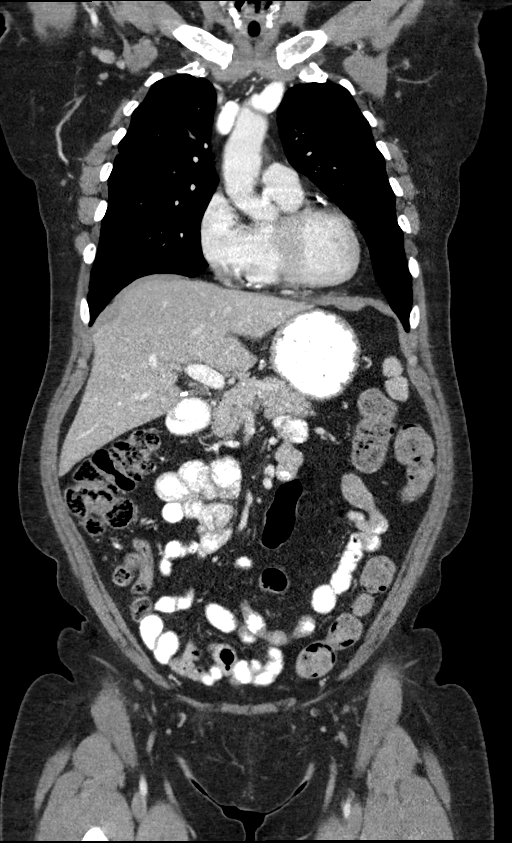
[im 86/154  soft-tissue]
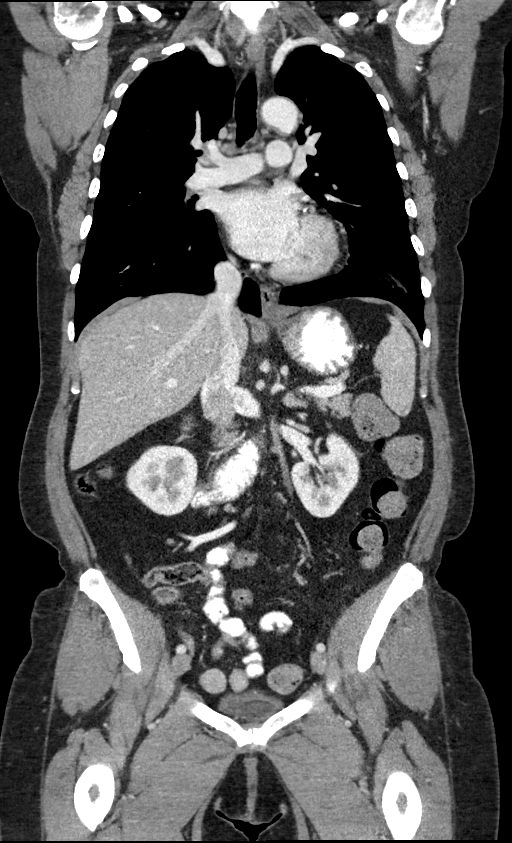

[15 of 46 positions shown; findings below may reference images not displayed]

FINDINGS: CT CHEST FINDINGS

Cardiovascular: Notable three-vessel coronary artery atherosclerotic
calcification. Atherosclerotic calcification in the aortic arch.

Mediastinum/Nodes: Scattered small axillary and mediastinal lymph
nodes without overt pathologic enlargement.

Lungs/Pleura: Mild biapical pleuroparenchymal scarring.

Musculoskeletal: Mild thoracic spondylosis. Suspected partial
interbody fusion at C6-7.

CT ABDOMEN PELVIS FINDINGS

Hepatobiliary: Cholecystectomy. Common bile duct measures up to 8 mm
in diameter. No intrahepatic biliary dilatation.

Pancreas: Unremarkable

Spleen: Unremarkable

Adrenals/Urinary Tract: Small cystocele due to pelvic floor laxity.
The kidneys and adrenal glands appear normal.

Stomach/Bowel: Prominent stool throughout the colon favors
constipation.

Vascular/Lymphatic: Aortoiliac atherosclerotic vascular disease.

Reproductive: Unremarkable

Other: No supplemental non-categorized findings.

Musculoskeletal: Disc bulge at the L4-5 level.
IMPRESSION: 1. No pathologic adenopathy is identified.
2. Aortic Atherosclerosis ([id number]). Three-vessel coronary
artery atherosclerotic calcification.
3. Cholecystectomy. Mild extrahepatic biliary prominence may be a
physiologic response to cholecystectomy.
4. Small bladder cystocele due to pelvic floor laxity.
5.  Prominent stool throughout the colon favors constipation.

## 2019-05-05 MED ORDER — CETIRIZINE-PSEUDOEPHEDRINE ER 5-120 MG PO TB12: 1.0000 | ORAL_TABLET | Freq: Two times a day (BID) | ORAL | 5 refills | Status: DC

## 2019-05-05 NOTE — Patient Instructions (Signed)
.   Please review the attached list of medications and notify my office if there are any errors.   . Please bring all of your medications to every appointment so we can make sure that our medication list is the same as yours.   

## 2019-05-11 IMAGING — CT CT BIOPSY
1 of 2 series · 10 of 14 positions shown, 13 images · non-contrast
Comparison: none

CLINICAL DATA: Refractory thrombocytopenia and ITP. Bone marrow
biopsy required for further workup.

[Series 2: i-spiral 5.0 b30f · axial · 0.45mm/px · z∈[+315,+441]mm · 10 of 46 slices shown, 13 images]
[im 5/46  soft-tissue]
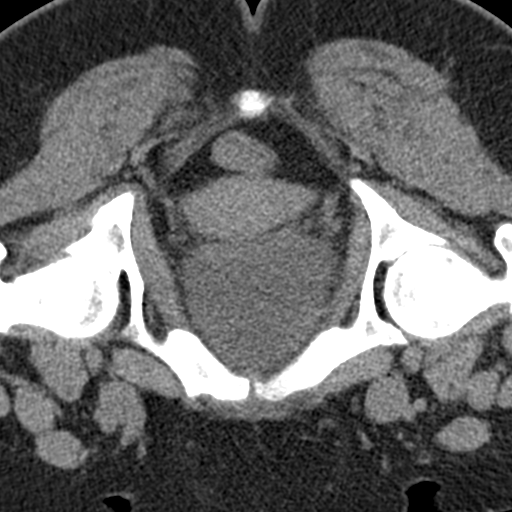
[im 5/46  bone]
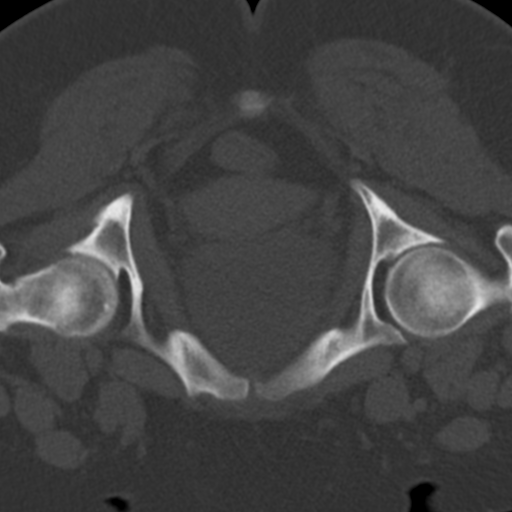
[im 9/46  bone]
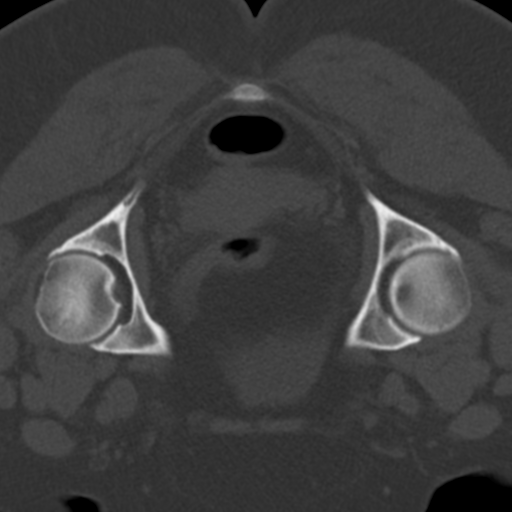
[im 13/46  bone]
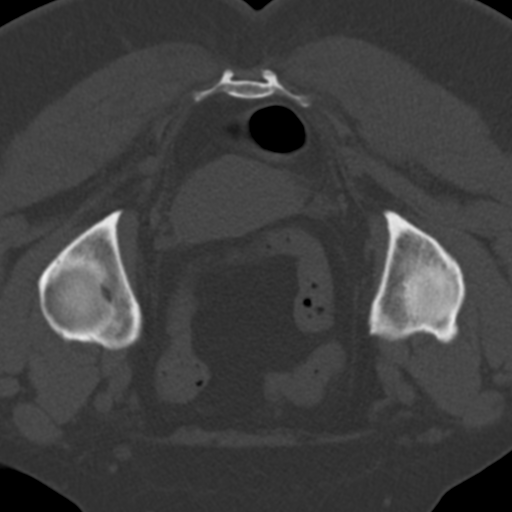
[im 17/46  bone]
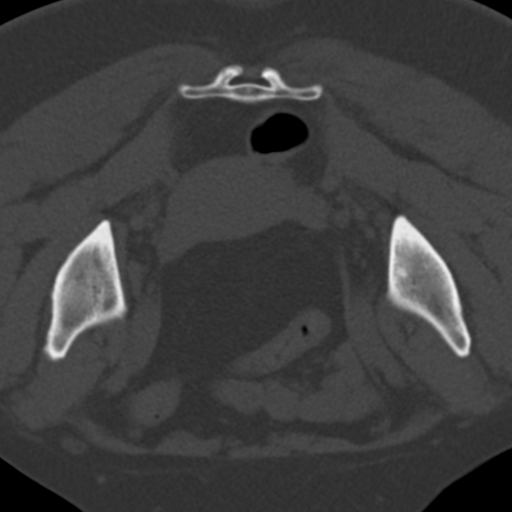
[im 21/46  soft-tissue]
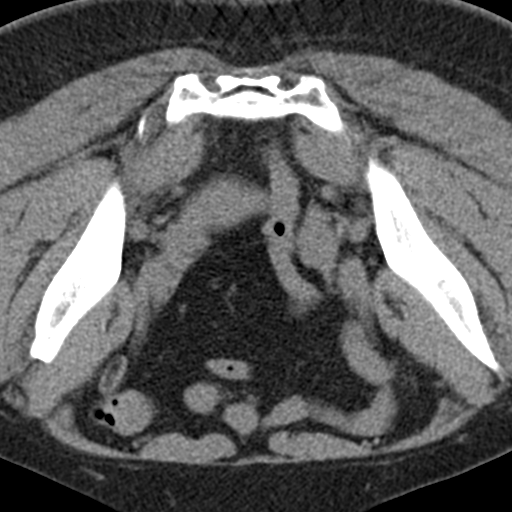
[im 21/46  bone]
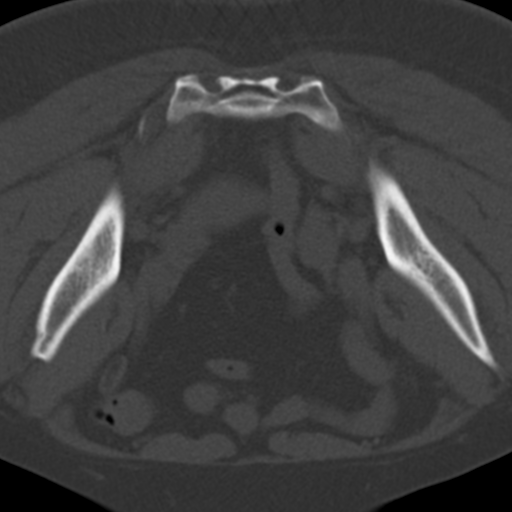
[im 25/46  bone]
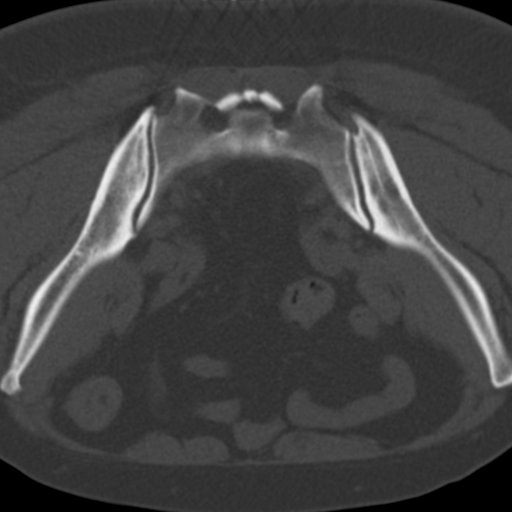
[im 29/46  bone]
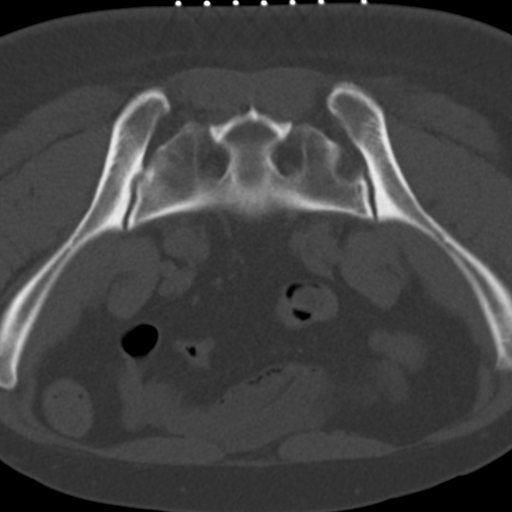
[im 33/46  bone]
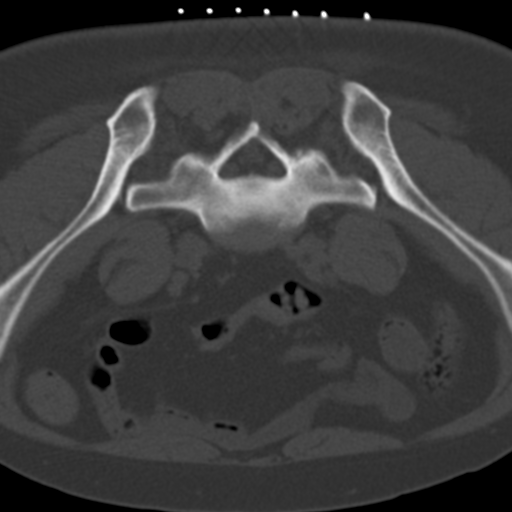
[im 37/46  soft-tissue]
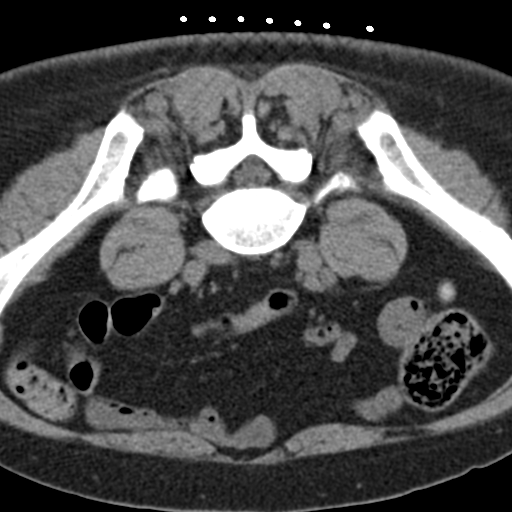
[im 37/46  bone]
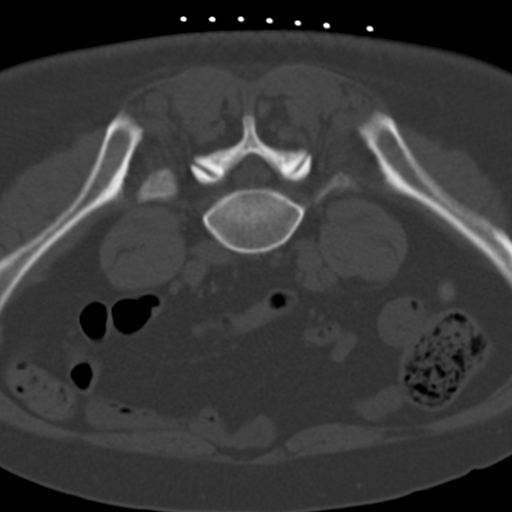
[im 41/46  bone]
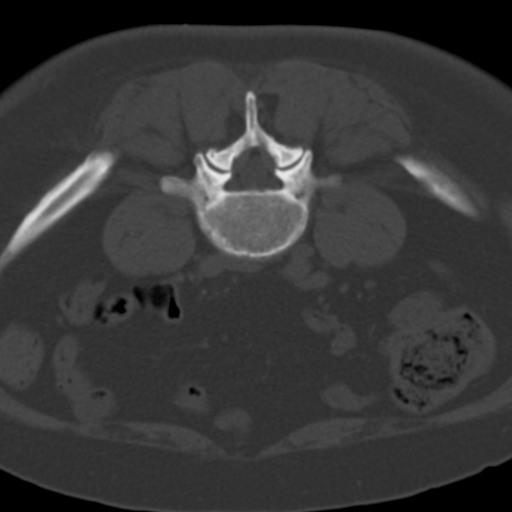

[10 of 14 positions shown; findings below may reference images not displayed]

EXAM:
CT GUIDED BONE MARROW ASPIRATION AND BIOPSY

ANESTHESIA/SEDATION:
Versed 2.0 mg IV, Fentanyl 75 mcg IV

Total Moderate Sedation Time:   22 minutes.

The patient's level of consciousness and physiologic status were
continuously monitored during the procedure by Radiology nursing.

PROCEDURE:
The procedure risks, benefits, and alternatives were explained to
the patient. Questions regarding the procedure were encouraged and
answered. The patient understands and consents to the procedure. A
time out was performed prior to initiating the procedure.

The right gluteal region was prepped with chlorhexidine. Sterile
gown and sterile gloves were used for the procedure. Local
anesthesia was provided with 1% Lidocaine.

Under CT guidance, an 11 gauge On Control bone cutting needle was
advanced from a posterior approach into the right iliac bone. Needle
positioning was confirmed with CT. Initial non heparinized and
heparinized aspirate samples were obtained of bone marrow. Core
biopsy was performed via the On Control drill needle.

COMPLICATIONS:
None
FINDINGS: Inspection of initial aspirate did reveal visible particles. Intact
core biopsy sample was obtained.
IMPRESSION: CT guided bone marrow biopsy of right posterior iliac bone with both
aspirate and core samples obtained.

## 2019-05-12 ENCOUNTER — Ambulatory Visit (INDEPENDENT_AMBULATORY_CARE_PROVIDER_SITE_OTHER): Payer: Medicare Other

## 2019-05-12 ENCOUNTER — Other Ambulatory Visit: Payer: Self-pay | Admitting: Family Medicine

## 2019-05-12 DIAGNOSIS — L501 Idiopathic urticaria: Secondary | ICD-10-CM

## 2019-05-12 NOTE — Telephone Encounter (Signed)
Requested Prescriptions  Pending Prescriptions Disp Refills  . sertraline (ZOLOFT) 50 MG tablet [Pharmacy Med Name: SERTRALINE HCL 50MG  TABLET] 180 tablet 1    Sig: TAKE 2 TABLETS BY MOUTH  DAILY     Psychiatry:  Antidepressants - SSRI Passed - 05/12/2019  4:33 AM      Passed - Valid encounter within last 6 months    Recent Outpatient Visits          1 week ago Diabetes mellitus without complication [facility])   [facility], [place]   6 months ago Annual physical exam   [facility], [place]   1 year ago Dermatitis   [facility], MD   1 year ago Type 2 diabetes mellitus without complication, without long-term current use of insulin [facility])   [facility], MD   1 year ago Need for influenza vaccination   [facility], MD      Future Appointments            In 5 months Fisher, Malva Limes, MD [facility]

## 2019-06-02 DIAGNOSIS — G5603 Carpal tunnel syndrome, bilateral upper limbs: Secondary | ICD-10-CM | POA: Diagnosis not present

## 2019-06-09 ENCOUNTER — Ambulatory Visit (INDEPENDENT_AMBULATORY_CARE_PROVIDER_SITE_OTHER): Payer: Medicare Other

## 2019-06-09 ENCOUNTER — Other Ambulatory Visit: Payer: Self-pay

## 2019-06-09 DIAGNOSIS — L501 Idiopathic urticaria: Secondary | ICD-10-CM

## 2019-06-09 DIAGNOSIS — L508 Other urticaria: Secondary | ICD-10-CM

## 2019-07-07 ENCOUNTER — Other Ambulatory Visit: Payer: Self-pay

## 2019-07-07 ENCOUNTER — Ambulatory Visit (INDEPENDENT_AMBULATORY_CARE_PROVIDER_SITE_OTHER): Payer: Medicare Other

## 2019-07-07 DIAGNOSIS — L508 Other urticaria: Secondary | ICD-10-CM

## 2019-07-07 DIAGNOSIS — L501 Idiopathic urticaria: Secondary | ICD-10-CM | POA: Diagnosis not present

## 2019-07-09 ENCOUNTER — Telehealth: Payer: Self-pay | Admitting: Family Medicine

## 2019-07-09 DIAGNOSIS — G5603 Carpal tunnel syndrome, bilateral upper limbs: Secondary | ICD-10-CM | POA: Diagnosis not present

## 2019-07-09 DIAGNOSIS — E119 Type 2 diabetes mellitus without complications: Secondary | ICD-10-CM

## 2019-07-09 NOTE — Telephone Encounter (Signed)
Called on behalf of patient to request a blood sugar monitor kit (Accuchek or One touch).  Stated that the insurance will cover either of these kits.  Please advise and call to confirm at [phone]

## 2019-07-10 MED ORDER — ONETOUCH ULTRASOFT LANCETS MISC: 4 refills | Status: DC

## 2019-07-10 MED ORDER — ONETOUCH ULTRA VI STRP: ORAL_STRIP | 4 refills | Status: DC

## 2019-07-10 MED ORDER — ONETOUCH ULTRA 2 W/DEVICE KIT: PACK | 0 refills | Status: AC

## 2019-07-10 NOTE — Telephone Encounter (Signed)
That's fine. Is she getting it from [facility]? Ok to send if so.

## 2019-07-10 NOTE — Telephone Encounter (Signed)
I called [place] [facility] and was advised that prescription for glucose meter kit needs to be sent to [facility] order pharmacy.

## 2019-07-14 ENCOUNTER — Other Ambulatory Visit: Payer: Self-pay

## 2019-07-14 ENCOUNTER — Encounter: Payer: Self-pay | Admitting: Oncology

## 2019-07-14 ENCOUNTER — Inpatient Hospital Stay: Payer: Medicare Other | Attending: Oncology

## 2019-07-14 ENCOUNTER — Inpatient Hospital Stay (HOSPITAL_BASED_OUTPATIENT_CLINIC_OR_DEPARTMENT_OTHER): Payer: Medicare Other | Admitting: Oncology

## 2019-07-14 VITALS — BP 108/73 | HR 76 | Temp 98.0°F | Resp 16 | Wt 163.7 lb

## 2019-07-14 DIAGNOSIS — Z79899 Other long term (current) drug therapy: Secondary | ICD-10-CM | POA: Insufficient documentation

## 2019-07-14 DIAGNOSIS — D693 Immune thrombocytopenic purpura: Secondary | ICD-10-CM | POA: Insufficient documentation

## 2019-07-14 DIAGNOSIS — K760 Fatty (change of) liver, not elsewhere classified: Secondary | ICD-10-CM | POA: Diagnosis not present

## 2019-07-14 DIAGNOSIS — D6959 Other secondary thrombocytopenia: Secondary | ICD-10-CM | POA: Diagnosis not present

## 2019-07-14 DIAGNOSIS — F329 Major depressive disorder, single episode, unspecified: Secondary | ICD-10-CM | POA: Diagnosis not present

## 2019-07-14 DIAGNOSIS — G5601 Carpal tunnel syndrome, right upper limb: Secondary | ICD-10-CM | POA: Diagnosis not present

## 2019-07-14 DIAGNOSIS — I10 Essential (primary) hypertension: Secondary | ICD-10-CM | POA: Insufficient documentation

## 2019-07-14 DIAGNOSIS — R5383 Other fatigue: Secondary | ICD-10-CM | POA: Diagnosis not present

## 2019-07-14 LAB — COMPREHENSIVE METABOLIC PANEL
ALT: 30 U/L (ref 0–44)
AST: 27 U/L (ref 15–41)
Albumin: 3.9 g/dL (ref 3.5–5.0)
Alkaline Phosphatase: 56 U/L (ref 38–126)
Anion gap: 9 (ref 5–15)
BUN: 14 mg/dL (ref 8–23)
CO2: 29 mmol/L (ref 22–32)
Calcium: 9.2 mg/dL (ref 8.9–10.3)
Chloride: 101 mmol/L (ref 98–111)
Creatinine, Ser: 0.77 mg/dL (ref 0.44–1.00)
GFR calc Af Amer: >60 mL/min (ref >60)
GFR calc non Af Amer: >60 mL/min (ref >60)
Glucose, Bld: 146 mg/dL — ABNORMAL HIGH (ref 70–99)
Potassium: 3.9 mmol/L (ref 3.5–5.1)
Sodium: 139 mmol/L (ref 135–145)
Total Bilirubin: 0.8 mg/dL (ref 0.3–1.2)
Total Protein: 7.8 g/dL (ref 6.5–8.1)

## 2019-07-14 LAB — CBC WITH DIFFERENTIAL/PLATELET
Abs Immature Granulocytes: 0.02 10*3/uL (ref 0.00–0.07)
Basophils Absolute: 0 10*3/uL (ref 0.0–0.1)
Basophils Relative: 0 %
Eosinophils Absolute: 0.5 10*3/uL (ref 0.0–0.5)
Eosinophils Relative: 6 %
HCT: 42.5 % (ref 36.0–46.0)
Hemoglobin: 14.3 g/dL (ref 12.0–15.0)
Immature Granulocytes: 0 %
Lymphocytes Relative: 45 %
Lymphs Abs: 3.8 10*3/uL (ref 0.7–4.0)
MCH: 29 pg (ref 26.0–34.0)
MCHC: 33.6 g/dL (ref 30.0–36.0)
MCV: 86.2 fL (ref 80.0–100.0)
Monocytes Absolute: 0.7 10*3/uL (ref 0.1–1.0)
Monocytes Relative: 8 %
Neutro Abs: 3.4 10*3/uL (ref 1.7–7.7)
Neutrophils Relative %: 41 %
Platelets: 145 10*3/uL — ABNORMAL LOW (ref 150–400)
RBC: 4.93 MIL/uL (ref 3.87–5.11)
RDW: 13.4 % (ref 11.5–15.5)
WBC: 8.5 10*3/uL (ref 4.0–10.5)
nRBC: 0 % (ref 0.0–0.2)

## 2019-07-14 NOTE — Progress Notes (Signed)
Hematology/Oncology Follow up visit [facility] [phone] [phone] Patient Care Team: Birdie Sons, MD as PCP - General (Family Medicine) Anell Barr, OD (Optometry)  REASON FOR VISIT Follow up for treatment of thrombocytopenia  PERTINENT HEMATOLOGY HISTORY Julia Garner 69 y.o.  female who has a history of recurrent ITP. Before she was referred to me, she has a long history of ITP which were treated with sterids by primary care physician and she used to respond to steroids. She was seen by anther Hematologist Dr.Pandit many years ago. Dr.Pandit has left [place].  She had labs doneon 08/22/2016. Platelet counts were slow at 15,000. She was treated with a short course (5 days) of Steroids by Dr.Fisher and platelet recovered to 120,000, however later dropped again to 11,000 on 09/10/2016.  I treated her with a longer course of prednisone with slow tapering and her counts again responded to treatment. During the tapering the course,she visited home country [place] and had blood work done there after she finished the tapering course of Prednisone there. She reports the platelet counts were normal in [place].   Patient has had lab work up including negative hepatitis, HIV, normal LDH, normal B12 and folate level. ANA was positive and she was evaluated by rheumatologist. Based on my phone discussion with Rheumatologist, patient has had rheumatology work up and was not considered to have any autoimmune problems. She does not have hepatosplenomegaly.   Patient returned to clinic in December and repeat blood work on 12/26/2016 showed platelet count of 16,000. Treated with a course of Dexamethasone 65m x 4 days, platelet again responded and dropped again.  And she reports easy bruising, feeling tired.  Peripheral blood flowcytometry 1% of analyzed cells containing clonal B cell population which are CD5- and CD 10-.  There is mildly increased  eosinophils 7%.   Patient does not have any palpable lymphadenopathy on physical examination and we obtained CT which showed no pathological lymphadenopathy. Bone marrow biopsy showed hypercellular marrow with trilineage hematopoiesis including increased megakaryocytes. No evidence of lymphoproliferative process involvement. Normal cytogenetics.  At this point patient has had extensive work up for her thrombocytopenia, and we discussed in lengthy that she most likely have ITP, there maybe a low grade lymphoma too. Given that she has recurrent ITP and platelet counts cannot be maintained without steroids, I suggest Rituximab weekly x 4 for consolidation. Patient has requested me to talk to her son in law Dr.Kamron who is a nephrologist in [place] I have talked to Dr.Kamron about the rationale of using Rituximab and also the side effects profile. Dr.Kamron agrees the plan and has relayed information to patient. I have also discussed the side effects of Rituximab with patient. She agrees with treatment.   INTERVAL HISTORY [name] a 69y.o. female who has above hematology history reviewed by me today presents for follow up visit for ITP.  Patient has been doing well clinically.  Except that she has had severe bilateral carpal tunnel syndrome and recently received joint steroid injections.  Denies any bleeding events.  Denies hematochezia, hematuria, hematemesis, epistaxis, black tarry stool or easy bruising.    ROS:  Review of Systems  Constitutional: Negative for appetite change, chills, fatigue and fever.  HENT:   Negative for hearing loss and voice change.   Eyes: Negative for eye problems.  Respiratory: Negative for chest tightness and cough.   Cardiovascular: Negative for chest pain.  Gastrointestinal: Negative for abdominal distention, abdominal pain and blood  in stool.  Endocrine: Negative for hot flashes.  Genitourinary: Negative for difficulty urinating and frequency.    Musculoskeletal: Negative for arthralgias.       Bilateral carpal tunnel syndrome  Skin: Negative for itching and rash.  Neurological: Negative for extremity weakness.  Hematological: Negative for adenopathy.  Psychiatric/Behavioral: Negative for confusion.    MEDICAL HISTORY:  Past Medical History:  Diagnosis Date  . Allergy   . Chronic ITP (idiopathic thrombocytopenia) (HCC)   . Chronic urticaria 08/15/2018  . Depression   . Head injuries   . History of seizure 2007   one seizure   . Hypertension     SURGICAL HISTORY: Past Surgical History:  Procedure Laterality Date  . CATARACT EXTRACTION, BILATERAL  around 2021   Dr. Ellin Mayhew  . CESAREAN SECTION  1990  . CHOLECYSTECTOMY  2005  . CYST EXCISION  1996   Tracheal Cyst    SOCIAL HISTORY: Social History   Socioeconomic History  . Marital status: Married    Spouse name: Not on file  . Number of children: 3  . Years of education: Not on file  . Highest education level: Not on file  Occupational History  . Occupation: retired    Comment: Community education officer  Tobacco Use  . Smoking status: Never Smoker  . Smokeless tobacco: Never Used  Vaping Use  . Vaping Use: Never used  Substance and Sexual Activity  . Alcohol use: No    Alcohol/week: 0.0 standard drinks  . Drug use: No  . Sexual activity: Not on file  Other Topics Concern  . Not on file  Social History Narrative  . Not on file   Social Determinants of Health   Financial Resource Strain:   . Difficulty of Paying Living Expenses:   Food Insecurity:   . Worried About Charity fundraiser in the Last Year:   . Arboriculturist in the Last Year:   Transportation Needs:   . Film/video editor (Medical):   Marland Kitchen Lack of Transportation (Non-Medical):   Physical Activity:   . Days of Exercise per Week:   . Minutes of Exercise per Session:   Stress:   . Feeling of Stress :   Social Connections:   . Frequency of Communication with Friends and Family:   .  Frequency of Social Gatherings with Friends and Family:   . Attends Religious Services:   . Active Member of Clubs or Organizations:   . Attends Archivist Meetings:   Marland Kitchen Marital Status:   Intimate Partner Violence:   . Fear of Current or Ex-Partner:   . Emotionally Abused:   Marland Kitchen Physically Abused:   . Sexually Abused:     FAMILY HISTORY: Family History  Problem Relation Age of Onset  . Heart attack Father        1st MI at age 64    ALLERGIES:  is allergic to niacin and related, penicillins, and tylenol  [acetaminophen].  MEDICATIONS:  Current Outpatient Medications  Medication Sig Dispense Refill  . Apoaequorin (PREVAGEN PO) Take 1 tablet by mouth daily.     Marland Kitchen b complex vitamins capsule Take 1 capsule by mouth daily.    Marland Kitchen Bioflavonoid Products (VITAMIN C PLUS) 500 MG TABS Take 2 tablets by mouth daily.     . Biotin 1000 MCG tablet Take 1,000 mcg by mouth daily.    . Blood Glucose Monitoring Suppl (ONE TOUCH ULTRA 2) w/Device KIT Use to check sugar daily for type 2  diabetes E11.9 1 kit 0  . Calcium Carbonate-Vit D-Min (CALCIUM 1200 PO) Take by mouth.    . cetirizine (ZYRTEC) 10 MG tablet Take 20 mg by mouth at bedtime.    . cetirizine-pseudoephedrine (ZYRTEC-D) 5-120 MG tablet Take 1 tablet by mouth 2 (two) times daily. 60 tablet 5  . clobetasol (TEMOVATE) 0.05 % external solution Apply 1 application topically 2 (two) times daily.    . famotidine (PEPCID) 40 MG tablet Take 40 mg by mouth daily.    . fexofenadine (ALLEGRA) 180 MG tablet Take 180 mg by mouth daily.    Marland Kitchen glucosamine-chondroitin 500-400 MG tablet Take 1 tablet by mouth 3 (three) times daily.    Marland Kitchen glucose blood (ONETOUCH ULTRA) test strip Use to check sugar daily for type 2 diabetes E11.9 100 each 4  . irbesartan-hydrochlorothiazide (AVALIDE) 150-12.5 MG tablet Take 1 tablet by mouth daily. 90 tablet 4  . Lancets (ONETOUCH ULTRASOFT) lancets Use to check sugar daily for type 2 diabetes E11.9 100 each 4  .  montelukast (SINGULAIR) 10 MG tablet Take 1 tablet (10 mg total) by mouth at bedtime. 30 tablet 5  . Multiple Vitamin (MULTIVITAMIN) tablet Take 1 tablet by mouth daily.    . Naproxen Sodium (ALEVE) 220 MG CAPS Take by mouth daily as needed.    . Omega 3 1200 MG CAPS Take 2 capsules by mouth daily.    . sertraline (ZOLOFT) 50 MG tablet TAKE 2 TABLETS BY MOUTH  DAILY 180 tablet 1  . simvastatin (ZOCOR) 40 MG tablet Take 1 tablet (40 mg total) by mouth at bedtime. 90 tablet 4  . traZODone (DESYREL) 50 MG tablet Take 2-3 tablets (100-150 mg total) by mouth at bedtime.    . triamcinolone cream (KENALOG) 0.1 % Apply 1 application topically 2 (two) times daily.    Marland Kitchen Ubiquinol 200 MG CAPS Take by mouth.     Current Facility-Administered Medications  Medication Dose Route Frequency Provider Last Rate Last Admin  . omalizumab Arvid Right) injection 300 mg  300 mg Subcutaneous Q28 days Valentina Shaggy, MD   300 mg at 07/07/19 1520      .  PHYSICAL EXAMINATION: ECOG PERFORMANCE STATUS: 0 - Asymptomatic Vitals:   07/14/19 1009  BP: 108/73  Pulse: 76  Resp: 16  Temp: 98 F (36.7 C)   Filed Weights   07/14/19 1009  Weight: 163 lb 11.2 oz (74.3 kg)  Physical Exam Constitutional:      General: She is not in acute distress.    Appearance: She is not diaphoretic.  HENT:     Head: Normocephalic and atraumatic.     Nose: Nose normal.     Mouth/Throat:     Pharynx: No oropharyngeal exudate.  Eyes:     General: No scleral icterus.       Left eye: No discharge.     Conjunctiva/sclera: Conjunctivae normal.     Pupils: Pupils are equal, round, and reactive to light.  Neck:     Vascular: No JVD.  Cardiovascular:     Rate and Rhythm: Normal rate and regular rhythm.     Heart sounds: Normal heart sounds. No murmur heard.   Pulmonary:     Effort: Pulmonary effort is normal. No respiratory distress.     Breath sounds: Normal breath sounds. No wheezing or rales.  Chest:     Chest wall: No  tenderness.  Abdominal:     General: Bowel sounds are normal. There is no distension.  Palpations: Abdomen is soft. There is no mass.     Tenderness: There is no abdominal tenderness. There is no rebound.  Musculoskeletal:        General: No tenderness. Normal range of motion.     Cervical back: Normal range of motion and neck supple.  Lymphadenopathy:     Cervical: No cervical adenopathy.  Skin:    General: Skin is dry.     Findings: No rash.  Neurological:     Mental Status: She is alert and oriented to person, place, and time.     Cranial Nerves: No cranial nerve deficit.     Motor: No abnormal muscle tone.     Coordination: Coordination normal.  Psychiatric:        Mood and Affect: Affect normal.        Cognition and Memory: Memory normal.        Judgment: Judgment normal.          LABORATORY DATA:  I have reviewed the data as listed Lab Results  Component Value Date   WBC 8.5 07/14/2019   HGB 14.3 07/14/2019   HCT 42.5 07/14/2019   MCV 86.2 07/14/2019   PLT 145 (L) 07/14/2019   Recent Labs    01/07/19 1355 04/13/19 1431  NA 140 136  K 3.4* 3.4*  CL 103 102  CO2 28 27  GLUCOSE 130* 111*  BUN 13 14  CREATININE 0.72 0.66  CALCIUM 9.1 9.1  GFRNONAA >60 >60  GFRAA >60 >60  PROT 7.6 7.8  ALBUMIN 3.8 3.7  AST 23 26  ALT 24 33  ALKPHOS 69 55  BILITOT 0.5 0.5     RADIOGRAPHIC STUDIES: I have personally reviewed the radiological images as listed and agreed with the findings in the report.   ultrasound of the abdomen showed  no spleenmegaly. Fatty liver disease.  01/17/2017 CT chest abdomen pelvis with contrast showed no pathological adenopathy was identified, aortic atherosclerosis, three-vessel coronary artery atherosclerotic calcification. Cholecystectomy, small bladder cystocele due to pelvic floor laxity. Prominent stool throughout the colon favors constipation.   ASSESSMENT & PLAN:  1. Chronic ITP (idiopathic thrombocytopenia) (HCC)   2. Fatty  liver disease, nonalcoholic    Thrombocytopenia, secondary to chronic ITP Labs reviewed and discussed with patient Her platelet counts are stable at 1 45,000 today.  This is slightly higher than her most recent baselines. Counts may be elevated due to recent steroid injections. Continue monitoring observation.  #Fatty liver disease, LFTs are stable.  Diet and exercise.  All questions were answered. The patient knows to call the clinic with an Earlie Server, MD, PhD Hematology Oncology [facility] Pager- [phone] 07/14/2019 y problems questions or concerns. Repeat CBC in 3 months and follow-up in 6 months.

## 2019-07-14 NOTE — Progress Notes (Signed)
Patient denies new problems/concerns today.   °

## 2019-07-23 DIAGNOSIS — G5603 Carpal tunnel syndrome, bilateral upper limbs: Secondary | ICD-10-CM | POA: Diagnosis not present

## 2019-07-31 ENCOUNTER — Ambulatory Visit (INDEPENDENT_AMBULATORY_CARE_PROVIDER_SITE_OTHER): Payer: Medicare Other | Admitting: Family Medicine

## 2019-07-31 ENCOUNTER — Other Ambulatory Visit: Payer: Self-pay

## 2019-07-31 ENCOUNTER — Encounter: Payer: Self-pay | Admitting: Family Medicine

## 2019-07-31 VITALS — BP 107/70 | HR 75 | Temp 96.2°F | Resp 18 | Ht 61.0 in | Wt 163.6 lb

## 2019-07-31 DIAGNOSIS — G5603 Carpal tunnel syndrome, bilateral upper limbs: Secondary | ICD-10-CM | POA: Insufficient documentation

## 2019-07-31 DIAGNOSIS — E119 Type 2 diabetes mellitus without complications: Secondary | ICD-10-CM

## 2019-07-31 MED ORDER — METFORMIN HCL ER 500 MG PO TB24: 500.0000 mg | ORAL_TABLET | Freq: Every day | ORAL | 3 refills | Status: DC

## 2019-07-31 NOTE — Patient Instructions (Signed)
.   Please check your blood pressure the next time you have a near-syncopal episode and let me know if it is under 100/60

## 2019-07-31 NOTE — Progress Notes (Signed)
me   Established patient visit  I,[name],acting as a scribe for Lelon Huh, MD.,have documented all relevant documentation on the behalf of Lelon Huh, MD,as directed by  Lelon Huh, MD while in the presence of Lelon Huh, MD.   Patient: Julia Garner   DOB: Jul 10, 1950   69 y.o. Female  MRN: [id number] Visit Date: 07/31/2019  Today's healthcare provider: Lelon Huh, MD   Chief Complaint  Patient presents with  . Diabetes   Subjective    HPI   Diabetes Mellitus Type II, Follow-up  Lab Results  Component Value Date   HGBA1C 6.5 (A) 05/05/2019   HGBA1C 6.5 (H) 11/11/2018   HGBA1C 6.8 (A) 12/25/2017   Wt Readings from Last 3 Encounters:  07/14/19 163 lb 11.2 oz (74.3 kg)  05/05/19 163 lb (73.9 kg)  11/04/18 168 lb (76.2 kg)   Last seen for diabetes 3 months ago.  Management since then includes no change. She reports good compliance with treatment. She is not having side effects.  Symptoms: No fatigue No foot ulcerations  No appetite changes No nausea  No paresthesia of the feet  No polydipsia  No polyuria No visual disturbances   No vomiting     Home blood sugar records: fasting range: 135-154  Episodes of hypoglycemia? No none   Current insulin regiment: None Most Recent Eye Exam: 02/16/2019  She is seen at [facility] for bilateral CTS and anticipates right CT release in August. She has had multiple steroid injections over the last several months. She has noticed her fasting sugars running higher the last few months, running consistently above 130 and sometimes into the 150s.   --------------------------------------------------------------------    Medications: Outpatient Medications Prior to Visit  Medication Sig  . b complex vitamins capsule Take 1 capsule by mouth daily.  [place] Kitchen Bioflavonoid Products (VITAMIN C PLUS) 500 MG TABS Take 2 tablets by mouth daily.   . Biotin 1000 MCG tablet Take 1,000 mcg by mouth daily.  . Blood Glucose  Monitoring Suppl (ONE TOUCH ULTRA 2) w/Device KIT Use to check sugar daily for type 2 diabetes E11.9  . cetirizine (ZYRTEC) 10 MG tablet Take 20 mg by mouth at bedtime as needed.   . cetirizine-pseudoephedrine (ZYRTEC-D) 5-120 MG tablet Take 1 tablet by mouth 2 (two) times daily. (Patient taking differently: Take 1 tablet by mouth 2 (two) times daily as needed. )  . clobetasol (TEMOVATE) 0.05 % external solution Apply 1 application topically 2 (two) times daily.  . famotidine (PEPCID) 40 MG tablet Take 40 mg by mouth daily as needed.   . fexofenadine (ALLEGRA) 180 MG tablet Take 180 mg by mouth daily as needed.   Marland Kitchen glucosamine-chondroitin 500-400 MG tablet Take 1 tablet by mouth 3 (three) times daily.  Marland Kitchen glucose blood (ONETOUCH ULTRA) test strip Use to check sugar daily for type 2 diabetes E11.9  . irbesartan-hydrochlorothiazide (AVALIDE) 150-12.5 MG tablet Take 1 tablet by mouth daily.  . Lancets (ONETOUCH ULTRASOFT) lancets Use to check sugar daily for type 2 diabetes E11.9  . Multiple Vitamin (MULTIVITAMIN) tablet Take 1 tablet by mouth daily.  . Naproxen Sodium (ALEVE) 220 MG CAPS Take by mouth daily as needed.  . Omega 3 1200 MG CAPS Take 2 capsules by mouth daily.  . sertraline (ZOLOFT) 50 MG tablet TAKE 2 TABLETS BY MOUTH  DAILY  . simvastatin (ZOCOR) 40 MG tablet Take 1 tablet (40 mg total) by mouth at bedtime.  . traZODone (DESYREL) 50 MG tablet Take 2-3  tablets (100-150 mg total) by mouth at bedtime.  Marland Kitchen Ubiquinol 200 MG CAPS Take by mouth. CoQ10  . Apoaequorin (PREVAGEN PO) Take 1 tablet by mouth daily.  (Patient not taking: Reported on 07/14/2019)  . Calcium Carbonate-Vit D-Min (CALCIUM 1200 PO) Take by mouth. (Patient not taking: Reported on 07/31/2019)  . montelukast (SINGULAIR) 10 MG tablet Take 1 tablet (10 mg total) by mouth at bedtime. (Patient not taking: Reported on 07/14/2019)  . triamcinolone cream (KENALOG) 0.1 % Apply 1 application topically 2 (two) times daily. (Patient not  taking: Reported on 07/31/2019)   Facility-Administered Medications Prior to Visit  Medication Dose Route Frequency Provider  . omalizumab Arvid Right) injection 300 mg  300 mg Subcutaneous Q28 days Valentina Shaggy, MD    Review of Systems  Constitutional: Negative for appetite change, chills, fatigue and fever.  Respiratory: Negative for chest tightness and shortness of breath.   Cardiovascular: Negative for chest pain and palpitations.  Gastrointestinal: Negative for abdominal pain, nausea and vomiting.  Neurological: Negative for dizziness and weakness.     Objective    BP 107/70 (BP Location: Left Arm, Patient Position: Sitting, Cuff Size: Large)   Pulse 75   Temp (!) 96.2 F (35.7 C) (Other (Comment))   Resp 18   Ht 5' 1"  (1.549 m)   Wt 163 lb 9.6 oz (74.2 kg)   SpO2 97%   BMI 30.91 kg/m   Physical Exam   General appearance: Well developed, well nourished female, cooperative and in no acute distress Head: Normocephalic, without obvious abnormality, atraumatic Respiratory: Respirations even and unlabored, normal respiratory rate Extremities: Wearing right CTS wrist splint.    No results found for any visits on 07/31/19.  Assessment & Plan     1. Diabetes mellitus without complication ([place]) Fastings have been running higher. Considering her anticipating surgery I think there would be significant benefit from starting - metFORMIN (GLUCOPHAGE XR) 500 MG 24 hr tablet; Take 1 tablet (500 mg total) by mouth daily with breakfast.  Dispense: 90 tablet; Refill: 3 Counseled of potential adverse effects and to call if any significant SE occur. Otherwise follow up in October.   2. Bilateral carpal tunnel syndrome Followed at [facility] anticipating right CT release next month.    No follow-ups on file.      The entirety of the information documented in the History of Present Illness, Review of Systems and Physical Exam were personally obtained by me. Portions of this  information were initially documented by the CMA and reviewed by me for thoroughness and accuracy.      Lelon Huh, MD  [facility] [phone] (phone) [phone] (fax)  [place]

## 2019-07-31 NOTE — Progress Notes (Deleted)
Established patient visit   Patient: Julia Garner   DOB: 02-02-1950   69 y.o. Female  MRN: [id number] Visit Date: 11/04/2019  Today's healthcare provider: Lelon Huh, MD   No chief complaint on file.  Subjective    HPI  Diabetes Mellitus Type II, Follow-up  Lab Results  Component Value Date   HGBA1C 6.5 (A) 05/05/2019   HGBA1C 6.5 (H) 11/11/2018   HGBA1C 6.8 (A) 12/25/2017   Wt Readings from Last 3 Encounters:  07/31/19 163 lb 9.6 oz (74.2 kg)  07/14/19 163 lb 11.2 oz (74.3 kg)  05/05/19 163 lb (73.9 kg)   Last seen for diabetes on July 16th ago.  Management since then includes starting metformin due to increasing fasting sugars and anticipation of carpal tunnel surgery. She reports {excellent/good/fair/poor:19665} compliance with treatment. She {is/is not:21021397} having side effects. {document side effects if present:1} Symptoms: {Yes/No:20286} fatigue {Yes/No:20286} foot ulcerations  {Yes/No:20286} appetite changes {Yes/No:20286} nausea  {Yes/No:20286} paresthesia of the feet  {Yes/No:20286} polydipsia  {Yes/No:20286} polyuria {Yes/No:20286} visual disturbances   {Yes/No:20286} vomiting     Home blood sugar records: {diabetes glucometry results:16657}  Episodes of hypoglycemia? {Yes/No:20286} {enter symptoms and frequency of symptoms if yes:1}   Current insulin regiment: {enter 'none' or type of insulin and number of units taken with each dose of each insulin formulation that the patient is taking:1} Most Recent Eye Exam: *** {Current exercise:16438:::1} {Current diet habits:16563:::1}  Pertinent Labs: Lab Results  Component Value Date   CHOL 141 11/11/2018   HDL 31 (L) 11/11/2018   LDLCALC 71 11/11/2018   TRIG 236 (H) 11/11/2018   CHOLHDL 4.5 (H) 11/11/2018   Lab Results  Component Value Date   NA 139 07/14/2019   K 3.9 07/14/2019   CREATININE 0.77 07/14/2019   GFRNONAA >60 07/14/2019   GFRAA >60 07/14/2019   GLUCOSE 146 (H) 07/14/2019       ---------------------------------------------------------------------------------------------------   {Show patient history (optional):23778::" "}   Medications: Outpatient Medications Prior to Visit  Medication Sig  . Apoaequorin (PREVAGEN PO) Take 1 tablet by mouth daily.  (Patient not taking: Reported on 07/14/2019)  . b complex vitamins capsule Take 1 capsule by mouth daily.  Marland Kitchen Bioflavonoid Products (VITAMIN C PLUS) 500 MG TABS Take 2 tablets by mouth daily.   . Biotin 1000 MCG tablet Take 1,000 mcg by mouth daily.  . Blood Glucose Monitoring Suppl (ONE TOUCH ULTRA 2) w/Device KIT Use to check sugar daily for type 2 diabetes E11.9  . Calcium Carbonate-Vit D-Min (CALCIUM 1200 PO) Take by mouth. (Patient not taking: Reported on 07/31/2019)  . cetirizine (ZYRTEC) 10 MG tablet Take 20 mg by mouth at bedtime as needed.   . cetirizine-pseudoephedrine (ZYRTEC-D) 5-120 MG tablet Take 1 tablet by mouth 2 (two) times daily. (Patient taking differently: Take 1 tablet by mouth 2 (two) times daily as needed. )  . clobetasol (TEMOVATE) 0.05 % external solution Apply 1 application topically 2 (two) times daily.  . famotidine (PEPCID) 40 MG tablet Take 40 mg by mouth daily as needed.   . fexofenadine (ALLEGRA) 180 MG tablet Take 180 mg by mouth daily as needed.   Marland Kitchen glucosamine-chondroitin 500-400 MG tablet Take 1 tablet by mouth 3 (three) times daily.  Marland Kitchen glucose blood (ONETOUCH ULTRA) test strip Use to check sugar daily for type 2 diabetes E11.9  . irbesartan-hydrochlorothiazide (AVALIDE) 150-12.5 MG tablet Take 1 tablet by mouth daily.  . Lancets (ONETOUCH ULTRASOFT) lancets Use to check sugar daily for type 2  diabetes E11.9  . metFORMIN (GLUCOPHAGE XR) 500 MG 24 hr tablet Take 1 tablet (500 mg total) by mouth daily with breakfast.  . montelukast (SINGULAIR) 10 MG tablet Take 1 tablet (10 mg total) by mouth at bedtime. (Patient not taking: Reported on 07/14/2019)  . Multiple Vitamin (MULTIVITAMIN)  tablet Take 1 tablet by mouth daily.  . Naproxen Sodium (ALEVE) 220 MG CAPS Take by mouth daily as needed.  . Omega 3 1200 MG CAPS Take 2 capsules by mouth daily.  . sertraline (ZOLOFT) 50 MG tablet TAKE 2 TABLETS BY MOUTH  DAILY  . simvastatin (ZOCOR) 40 MG tablet Take 1 tablet (40 mg total) by mouth at bedtime.  . traZODone (DESYREL) 50 MG tablet Take 2-3 tablets (100-150 mg total) by mouth at bedtime.  . triamcinolone cream (KENALOG) 0.1 % Apply 1 application topically 2 (two) times daily. (Patient not taking: Reported on 07/31/2019)  . Ubiquinol 200 MG CAPS Take by mouth. CoQ10   Facility-Administered Medications Prior to Visit  Medication Dose Route Frequency Provider  . omalizumab Arvid Right) injection 300 mg  300 mg Subcutaneous Q28 days Valentina Shaggy, MD    Review of Systems  {Heme  Chem  Endocrine  Serology  Results Review (optional):23779::" "}  Objective    There were no vitals taken for this visit. {Show previous vital signs (optional):23777::" "}  Physical Exam  ***  No results found for any visits on 11/04/19.  Assessment & Plan     ***  No follow-ups on file.      {provider attestation***:1}   Lelon Huh, MD  [facility] [phone] (phone) [phone] (fax)  [place]

## 2019-08-04 ENCOUNTER — Ambulatory Visit: Payer: Self-pay

## 2019-08-05 ENCOUNTER — Other Ambulatory Visit: Payer: Self-pay

## 2019-08-05 ENCOUNTER — Ambulatory Visit (INDEPENDENT_AMBULATORY_CARE_PROVIDER_SITE_OTHER): Payer: Medicare Other

## 2019-08-05 DIAGNOSIS — L501 Idiopathic urticaria: Secondary | ICD-10-CM

## 2019-08-05 DIAGNOSIS — L508 Other urticaria: Secondary | ICD-10-CM

## 2019-08-25 ENCOUNTER — Telehealth: Payer: Self-pay

## 2019-08-25 NOTE — Telephone Encounter (Signed)
She can continue with the Xolair despite the surgery. That will help control her hives during the surgery. She does need a follow up visit, whoever, since it has been over one year. We can wait until after her surgery.   Malachi Bonds, MD [facility]

## 2019-08-25 NOTE — Telephone Encounter (Signed)
Dr. Dellis Anes patient is requesting your opinion. She is currently on Xolair injections.

## 2019-08-25 NOTE — Telephone Encounter (Signed)
Patient called wanting to speak to a nurse regarding a surgery she has coming up and her injections she receives with our office. I offered to let her speak to the shot room she states she would like to get Dr Nino Glow opinion instead.   Please Advise.

## 2019-08-26 NOTE — Telephone Encounter (Signed)
Called and informed patient. Patient verbalized understanding and has changed her appointment for her injection to the 17th since her surgery is on the 18th. Patient will call back after her surgery to schedule a follow up appointment.

## 2019-08-31 ENCOUNTER — Other Ambulatory Visit: Payer: Self-pay | Admitting: Family Medicine

## 2019-08-31 DIAGNOSIS — E119 Type 2 diabetes mellitus without complications: Secondary | ICD-10-CM

## 2019-08-31 NOTE — Telephone Encounter (Signed)
Medication Refill - Medication: one touch ultra 2 test strips  Has the patient contacted their pharmacy? Yes.  Pt states that she checks her blood sugar 2x a day. Please advise.  (Agent: If no, request that the patient contact the pharmacy for the refill.) (Agent: If yes, when and what did the pharmacy advise?)  Preferred Pharmacy (with phone number or street name):  [facility] - [place], [place] - [address] [place], [address]  [address] [place], [address] [place]  [address]  Phone: [phone] Fax: [phone]  Hours: Not open 24 hours     Agent: Please be advised that RX refills may take up to 3 business days. We ask that you follow-up with your pharmacy.

## 2019-08-31 NOTE — Telephone Encounter (Signed)
Okay to change sig? So patient can check blood sugar readings twice a day. KW

## 2019-08-31 NOTE — Telephone Encounter (Signed)
Patient is requesting change in directions on testing supplies to bid for test strips so she will be able to get enough strips to check twice/day.

## 2019-08-31 NOTE — Telephone Encounter (Signed)
That would be fine to change the sig.

## 2019-09-01 ENCOUNTER — Other Ambulatory Visit: Payer: Self-pay

## 2019-09-01 ENCOUNTER — Ambulatory Visit (INDEPENDENT_AMBULATORY_CARE_PROVIDER_SITE_OTHER): Payer: Medicare Other

## 2019-09-01 DIAGNOSIS — L501 Idiopathic urticaria: Secondary | ICD-10-CM | POA: Diagnosis not present

## 2019-09-01 DIAGNOSIS — L508 Other urticaria: Secondary | ICD-10-CM

## 2019-09-01 MED ORDER — ONETOUCH ULTRASOFT LANCETS MISC: 4 refills | Status: AC

## 2019-09-01 MED ORDER — ONETOUCH ULTRA VI STRP: ORAL_STRIP | 4 refills | Status: DC

## 2019-09-01 NOTE — Addendum Note (Signed)
Addended by: [place] on: 09/01/2019 09:39 AM   Modules accepted: Orders

## 2019-09-01 NOTE — Telephone Encounter (Signed)
Sent in test strips and lancets into mail order pharmacy to be checked twice daily.

## 2019-09-02 ENCOUNTER — Ambulatory Visit: Payer: Self-pay

## 2019-09-02 DIAGNOSIS — D693 Immune thrombocytopenic purpura: Secondary | ICD-10-CM | POA: Diagnosis not present

## 2019-09-02 DIAGNOSIS — E119 Type 2 diabetes mellitus without complications: Secondary | ICD-10-CM | POA: Diagnosis not present

## 2019-09-02 DIAGNOSIS — E7800 Pure hypercholesterolemia, unspecified: Secondary | ICD-10-CM | POA: Diagnosis not present

## 2019-09-02 DIAGNOSIS — K219 Gastro-esophageal reflux disease without esophagitis: Secondary | ICD-10-CM | POA: Diagnosis not present

## 2019-09-02 DIAGNOSIS — Z7984 Long term (current) use of oral hypoglycemic drugs: Secondary | ICD-10-CM | POA: Diagnosis not present

## 2019-09-02 DIAGNOSIS — I10 Essential (primary) hypertension: Secondary | ICD-10-CM | POA: Diagnosis not present

## 2019-09-02 DIAGNOSIS — R569 Unspecified convulsions: Secondary | ICD-10-CM | POA: Diagnosis not present

## 2019-09-02 DIAGNOSIS — Z886 Allergy status to analgesic agent status: Secondary | ICD-10-CM | POA: Diagnosis not present

## 2019-09-02 DIAGNOSIS — G4733 Obstructive sleep apnea (adult) (pediatric): Secondary | ICD-10-CM | POA: Diagnosis not present

## 2019-09-02 DIAGNOSIS — Z880 Allergy status to penicillin: Secondary | ICD-10-CM | POA: Diagnosis not present

## 2019-09-02 DIAGNOSIS — G5601 Carpal tunnel syndrome, right upper limb: Secondary | ICD-10-CM | POA: Diagnosis not present

## 2019-09-29 ENCOUNTER — Other Ambulatory Visit: Payer: Self-pay

## 2019-09-29 ENCOUNTER — Ambulatory Visit: Payer: Medicare Other | Admitting: Allergy & Immunology

## 2019-09-29 ENCOUNTER — Ambulatory Visit: Payer: Self-pay

## 2019-09-29 ENCOUNTER — Encounter: Payer: Self-pay | Admitting: Allergy & Immunology

## 2019-09-29 VITALS — BP 110/60 | HR 85 | Resp 16 | Ht 60.0 in | Wt 159.6 lb

## 2019-09-29 DIAGNOSIS — B999 Unspecified infectious disease: Secondary | ICD-10-CM | POA: Diagnosis not present

## 2019-09-29 DIAGNOSIS — L508 Other urticaria: Secondary | ICD-10-CM

## 2019-09-29 DIAGNOSIS — L501 Idiopathic urticaria: Secondary | ICD-10-CM

## 2019-09-29 NOTE — Patient Instructions (Addendum)
1. Chronic urticaria - Continue with Allegra in the morning for now. - We are going to continue with Xolair and increase to every two weeks to see if this helps.  - We are going to see whether we can get the home administration approved.  - I would introduce tree nuts individually at home to see if you react to them. - Keep your epinephrine injector with you when you do this.   2. Immunodeficiency workup - We are going to get repeat Pneumococcal titers today.    3. Return in about 3 months (around 12/29/2019).    Please inform us of any Emergency Department visits, hospitalizations, or changes in symptoms. Call us before going to the ED for breathing or allergy symptoms since we might be able to fit you in for a sick visit. Feel free to contact us anytime with any questions, problems, or concerns.  It was a pleasure to see you again today!  Websites that have reliable patient information: 1. American Academy of Asthma, Allergy, and Immunology: www.aaaai.org 2. Food Allergy Research and Education (FARE): foodallergy.org 3. Mothers of Asthmatics: http://www.asthmacommunitynetwork.org 4. American College of Allergy, Asthma, and Immunology: www.acaai.org   COVID-19 Vaccine Information can be found at: [email or web address].[email or web address] For questions related to vaccine distribution or appointments, please email [email or web address] or call [phone].     "Like" [place] on Facebook and Instagram for our latest updates!        Make sure you are registered to vote! If you have moved or changed any of your contact information, you will need to get this updated before voting!  In some cases, you MAY be able to register to vote online: [email or web address]

## 2019-09-29 NOTE — Progress Notes (Signed)
FOLLOW UP  Date of Service/Encounter:  09/29/19   Assessment:   Chronic urticaria - controlled with Xolair monthly, but with some breakthrough episodes  Recurrent infections- mostly sinopulmonary in nature (repeating Pneumococcal titers today)  Chronic ITP - with round of rituximab three years ago (weekly x 4 doses)  Plan/Recommendations:   1. Chronic urticaria - Continue with Allegra in the morning for now. - We are going to continue with Xolair and increase to every two weeks to see if this helps.   - We are going to see whether we can get the home administration approved.  - I would introduce tree nuts individually at home to see if you react to them. - Keep your epinephrine injector with you when you do this.   2. Immunodeficiency workup - We are going to get repeat Pneumococcal titers today.    3. Return in about 3 months (around 12/29/2019).    Subjective:   Julia Garner is a 69 y.o. female presenting today for follow up of  Chief Complaint  Patient presents with  . Follow-up  . Urticaria  . Pruritus    scalp is itching    Julia Garner has a history of the following: Patient Active Problem List   Diagnosis Date Noted  . Bilateral carpal tunnel syndrome 07/31/2019  . Aortic atherosclerosis (HCC) 11/04/2018  . Fatty liver 11/04/2018  . Chronic urticaria 08/15/2018  . Chronic ITP (idiopathic thrombocytopenia) (HCC) 08/15/2018  . Recurrent infections 08/15/2018  . Diabetes mellitus without complication (HCC) 09/15/2017  . Acute ITP (HCC) 09/16/2016  . Allergic rhinitis 06/29/2014  . Insomnia 06/29/2014  . Arthralgia of temporomandibular joint 06/29/2014  . Edema 06/29/2014  . Oral aphthae 07/08/2007  . 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 Arthropathy of hand 01/25/2007  . Fam hx-ischem heart disease 01/21/2007  . Menopausal and postmenopausal disorder 10/08/2005  . Tachycardia 10/08/2005  . Thrombocytopenia (HCC) 10/08/2005  .  Obstructive sleep apnea of adult 10/24/2004  . Essential (primary) hypertension 01/16/2004  . Mood disorder (HCC) 01/15/2002    History obtained from: chart review and patient.  Julia Garner is a 69 y.o. female presenting for a follow up visit.  She was last seen in July 2020.  At that time, she presented to discuss her lab results.  We continue with suppressive doses of antihistamines Allegra 2 tablets in the morning and Zyrtec 2 tablets in the evening with Pepcid.  We did discuss initiation of Xolair, which she did end up doing.  For her recurrent infections, we recommended that she get a Pneumovax with repeat titers.  It is unclear whether she got the Pneumovax.  Since the last visit, she has continued to have some constant itching in her head. She is using the Allegra intermittently. She continues to have some itching on her scalp. She is not sure whether she gets hives on her scalp. She uses Allegra more days than note. She has noticed that   She reports that she likes eating nuts. She eats a lot of mixed nuts. She has noticed that her itching gets worse with ingestion of mixed nuts. She is wondering whether she can get tested for tree nuts to see if she is allergic.  She has never needed epinephrine from eating a tree nut.   She did get COVID vaccines without a problem. She has been doing more traveling since getting the vaccination. Evidently she has visiting all of the state capitols on her bucket list. She is going to be traveling to  [place] and some other [place] states to check out their capitols. Her husband is not very thrilled with this traveling plan, as he does not want to be traveling for more than ten nights away from the home.   Otherwise, there have been no changes to her past medical history, surgical history, family history, or social history.    Review of Systems  Constitutional: Negative.  Negative for chills, fever, malaise/fatigue and weight loss.  HENT:  Negative for congestion, ear discharge, ear pain and sore throat.   Eyes: Negative for pain, discharge and redness.  Respiratory: Negative for cough, sputum production, shortness of breath and wheezing.   Cardiovascular: Negative.  Negative for chest pain and palpitations.  Gastrointestinal: Negative for abdominal pain, constipation, diarrhea, heartburn, nausea and vomiting.  Skin: Positive for itching and rash.  Neurological: Negative for dizziness and headaches.  Endo/Heme/Allergies: Negative for environmental allergies. Does not bruise/bleed easily.       Objective:   Blood pressure 110/60, pulse 85, resp. rate 16, height 5' (1.524 m), weight 159 lb 9.6 oz (72.4 kg), SpO2 96 %. Body mass index is 31.17 kg/m.   Physical Exam:  Physical Exam Constitutional:      Appearance: She is well-developed.     Comments: Pleasant female. Very talkative.    HENT:     Head: Normocephalic and atraumatic.     Right Ear: Tympanic membrane, ear canal and external ear normal.     Left Ear: Tympanic membrane, ear canal and external ear normal.     Nose: No nasal deformity, septal deviation, mucosal edema or rhinorrhea.     Right Turbinates: Not enlarged or swollen.     Left Turbinates: Not enlarged or swollen.     Right Sinus: No maxillary sinus tenderness or frontal sinus tenderness.     Left Sinus: No maxillary sinus tenderness or frontal sinus tenderness.     Mouth/Throat:     Mouth: Mucous membranes are not pale and not dry.     Pharynx: Uvula midline.  Eyes:     General:        Right eye: No discharge.        Left eye: No discharge.     Conjunctiva/sclera: Conjunctivae normal.     Right eye: Right conjunctiva is not injected. No chemosis.    Left eye: Left conjunctiva is not injected. No chemosis.    Pupils: Pupils are equal, round, and reactive to light.  Cardiovascular:     Rate and Rhythm: Normal rate and regular rhythm.     Heart sounds: Normal heart sounds.  Pulmonary:      Effort: Pulmonary effort is normal. No tachypnea, accessory muscle usage or respiratory distress.     Breath sounds: Normal breath sounds. No wheezing, rhonchi or rales.     Comments: Moving air well in all lung fields. No increased work of breathing noted.  Chest:     Chest wall: No tenderness.  Lymphadenopathy:     Cervical: No cervical adenopathy.  Skin:    Coloration: Skin is not pale.     Findings: No abrasion, erythema, petechiae or rash. Rash is not papular, urticarial or vesicular.     Comments: She does have some excoriation marks present, but no outright urticaria.   Neurological:     Mental Status: She is alert.  Psychiatric:        Behavior: Behavior is cooperative.      Diagnostic studies: none     Malachi Bonds, MD  [facility]

## 2019-09-30 ENCOUNTER — Encounter: Payer: Self-pay | Admitting: Allergy & Immunology

## 2019-10-01 ENCOUNTER — Telehealth: Payer: Self-pay

## 2019-10-01 NOTE — Telephone Encounter (Signed)
Copied from CRM [id number]. Topic: General - Inquiry >> Oct 01, 2019  9:58 AM Crist Infante wrote: Reason for CRM: pt states Dr Sherrie Mustache put her on metformin 500 mg about a month ago, and it doesn't seem to be helping.  Her bllod sugar never gets below 140.  Would like to know if this med may need to be increased.

## 2019-10-02 NOTE — Telephone Encounter (Signed)
If she is seeing sugars over 200, then she could increase to two. Otherwise she should continue one a day and we will see how her A1c is at her visit next month.

## 2019-10-02 NOTE — Telephone Encounter (Signed)
Patient states her sugars are averaging around 159, 140.

## 2019-10-02 NOTE — Telephone Encounter (Signed)
Please advise 

## 2019-10-06 ENCOUNTER — Telehealth: Payer: Self-pay | Admitting: *Deleted

## 2019-10-06 ENCOUNTER — Other Ambulatory Visit: Payer: Self-pay | Admitting: *Deleted

## 2019-10-06 MED ORDER — OMALIZUMAB 150 MG/ML ~~LOC~~ SOSY: 300.0000 mg | PREFILLED_SYRINGE | SUBCUTANEOUS | 11 refills | Status: DC

## 2019-10-06 NOTE — Telephone Encounter (Signed)
-----   Message from Julia Spruce, MD sent at 09/30/2019  8:19 PM EDT ----- Can we increase her Xolair to every two weeks? She is also interested in a home autoinjector. I believe she lives in [place]. Thanks!

## 2019-10-06 NOTE — Telephone Encounter (Signed)
Sent new RX to [facility] for syringes with increased dose. Will check status in couple days to see if syringe and increased dose goes through. L/M advising patient of same

## 2019-10-08 LAB — STREP PNEUMONIAE 23 SEROTYPES IGG
Pneumo Ab Type 1*: 0.8 ug/mL — ABNORMAL LOW (ref >1.3)
Pneumo Ab Type 12 (12F)*: 0.4 ug/mL — ABNORMAL LOW (ref >1.3)
Pneumo Ab Type 14*: 5.5 ug/mL (ref >1.3)
Pneumo Ab Type 17 (17F)*: 0.6 ug/mL — ABNORMAL LOW (ref >1.3)
Pneumo Ab Type 19 (19F)*: 11 ug/mL (ref >1.3)
Pneumo Ab Type 2*: 1.9 ug/mL (ref >1.3)
Pneumo Ab Type 20*: 2.3 ug/mL (ref >1.3)
Pneumo Ab Type 22 (22F)*: 2.1 ug/mL (ref >1.3)
Pneumo Ab Type 23 (23F)*: 4.4 ug/mL (ref >1.3)
Pneumo Ab Type 26 (6B)*: 4.8 ug/mL (ref >1.3)
Pneumo Ab Type 3*: 3.3 ug/mL (ref >1.3)
Pneumo Ab Type 34 (10A)*: 1.2 ug/mL — ABNORMAL LOW (ref >1.3)
Pneumo Ab Type 4*: 0.6 ug/mL — ABNORMAL LOW (ref >1.3)
Pneumo Ab Type 43 (11A)*: 1.7 ug/mL (ref >1.3)
Pneumo Ab Type 5*: 0.6 ug/mL — ABNORMAL LOW (ref >1.3)
Pneumo Ab Type 51 (7F)*: 7.1 ug/mL (ref >1.3)
Pneumo Ab Type 54 (15B)*: 1.4 ug/mL (ref >1.3)
Pneumo Ab Type 56 (18C)*: 1.6 ug/mL (ref >1.3)
Pneumo Ab Type 57 (19A)*: 5.2 ug/mL (ref >1.3)
Pneumo Ab Type 68 (9V)*: 1.8 ug/mL (ref >1.3)
Pneumo Ab Type 70 (33F)*: 5.6 ug/mL (ref >1.3)
Pneumo Ab Type 8*: 4.8 ug/mL (ref >1.3)
Pneumo Ab Type 9 (9N)*: 1.7 ug/mL (ref >1.3)

## 2019-10-14 ENCOUNTER — Inpatient Hospital Stay: Payer: Medicare Other | Attending: Oncology

## 2019-10-14 ENCOUNTER — Other Ambulatory Visit: Payer: Self-pay

## 2019-10-14 DIAGNOSIS — D693 Immune thrombocytopenic purpura: Secondary | ICD-10-CM

## 2019-10-14 LAB — CBC WITH DIFFERENTIAL/PLATELET
Abs Immature Granulocytes: 0.02 10*3/uL (ref 0.00–0.07)
Basophils Absolute: 0 10*3/uL (ref 0.0–0.1)
Basophils Relative: 0 %
Eosinophils Absolute: 0.7 10*3/uL — ABNORMAL HIGH (ref 0.0–0.5)
Eosinophils Relative: 7 %
HCT: 40 % (ref 36.0–46.0)
Hemoglobin: 13.8 g/dL (ref 12.0–15.0)
Immature Granulocytes: 0 %
Lymphocytes Relative: 44 %
Lymphs Abs: 4.3 10*3/uL — ABNORMAL HIGH (ref 0.7–4.0)
MCH: 29.4 pg (ref 26.0–34.0)
MCHC: 34.5 g/dL (ref 30.0–36.0)
MCV: 85.1 fL (ref 80.0–100.0)
Monocytes Absolute: 0.7 10*3/uL (ref 0.1–1.0)
Monocytes Relative: 7 %
Neutro Abs: 4.1 10*3/uL (ref 1.7–7.7)
Neutrophils Relative %: 42 %
Platelets: 144 10*3/uL — ABNORMAL LOW (ref 150–400)
RBC: 4.7 MIL/uL (ref 3.87–5.11)
RDW: 12.9 % (ref 11.5–15.5)
WBC: 9.9 10*3/uL (ref 4.0–10.5)
nRBC: 0 % (ref 0.0–0.2)

## 2019-10-21 NOTE — Telephone Encounter (Signed)
We have received two doses syringes for patient to go to every 14 days and come in next week for instrux on self-admin. Advised patient of storage, dosing and re-ordering her medication

## 2019-10-26 ENCOUNTER — Encounter: Payer: Self-pay | Admitting: Family Medicine

## 2019-10-26 ENCOUNTER — Other Ambulatory Visit: Payer: Self-pay

## 2019-10-26 ENCOUNTER — Ambulatory Visit (INDEPENDENT_AMBULATORY_CARE_PROVIDER_SITE_OTHER): Payer: Medicare Other | Admitting: Family Medicine

## 2019-10-26 VITALS — BP 110/71 | HR 74 | Temp 98.5°F | Ht 60.0 in | Wt 157.2 lb

## 2019-10-26 DIAGNOSIS — E119 Type 2 diabetes mellitus without complications: Secondary | ICD-10-CM

## 2019-10-26 DIAGNOSIS — I10 Essential (primary) hypertension: Secondary | ICD-10-CM

## 2019-10-26 DIAGNOSIS — Z1211 Encounter for screening for malignant neoplasm of colon: Secondary | ICD-10-CM | POA: Diagnosis not present

## 2019-10-26 LAB — POCT GLYCOSYLATED HEMOGLOBIN (HGB A1C)
Est. average glucose Bld gHb Est-mCnc: 128
Hemoglobin A1C: 6.1 % — AB (ref 4.0–5.6)

## 2019-10-26 NOTE — Progress Notes (Signed)
Established patient visit   Patient: Julia Garner   DOB: Oct 07, 1950   69 y.o. Female  MRN: [id number] Visit Date: 10/26/2019  Today's healthcare provider: Lelon Huh, MD   Chief Complaint  Patient presents with  . Diabetes   Subjective    HPI  Diabetes Mellitus Type II, Follow-up  Lab Results  Component Value Date   HGBA1C 6.5 (A) 05/05/2019   HGBA1C 6.5 (H) 11/11/2018   HGBA1C 6.8 (A) 12/25/2017   Wt Readings from Last 3 Encounters:  10/26/19 157 lb 3.2 oz (71.3 kg)  09/29/19 159 lb 9.6 oz (72.4 kg)  07/31/19 163 lb 9.6 oz (74.2 kg)   Last seen for diabetes 3 months ago.  Management since then includes started Metformin. She reports good compliance with treatment. She is not having side effects.  Symptoms: No fatigue No foot ulcerations  No appetite changes No nausea  No paresthesia of the feet  No polydipsia  No polyuria No visual disturbances   No vomiting     Home blood sugar records: fasting range: 140-160's  Episodes of hypoglycemia? No    Current insulin regiment: None Most Recent Eye Exam: 02/16/2019 Current exercise: walking Current diet habits: low sugar   Pertinent Labs: Lab Results  Component Value Date   CHOL 141 11/11/2018   HDL 31 (L) 11/11/2018   LDLCALC 71 11/11/2018   TRIG 236 (H) 11/11/2018   CHOLHDL 4.5 (H) 11/11/2018   Lab Results  Component Value Date   NA 139 07/14/2019   K 3.9 07/14/2019   CREATININE 0.77 07/14/2019   GFRNONAA >60 07/14/2019   GFRAA >60 07/14/2019   GLUCOSE 146 (H) 07/14/2019     ---------------------------------------------------------------------------------------------------      Medications: Outpatient Medications Prior to Visit  Medication Sig  . Apoaequorin (PREVAGEN PO) Take 1 tablet by mouth daily.   Marland Kitchen b complex vitamins capsule Take 1 capsule by mouth daily.  Marland Kitchen Bioflavonoid Products (VITAMIN C PLUS) 500 MG TABS Take 2 tablets by mouth daily.   . Biotin 1000 MCG tablet Take 1,000  mcg by mouth daily.  . Blood Glucose Monitoring Suppl (ONE TOUCH ULTRA 2) w/Device KIT Use to check sugar daily for type 2 diabetes E11.9  . Calcium Carbonate-Vit D-Min (CALCIUM 1200 PO) Take by mouth.   . cetirizine (ZYRTEC) 10 MG tablet Take 20 mg by mouth at bedtime as needed.   . cetirizine-pseudoephedrine (ZYRTEC-D) 5-120 MG tablet Take 1 tablet by mouth 2 (two) times daily. (Patient taking differently: Take 1 tablet by mouth 2 (two) times daily as needed. )  . clobetasol (TEMOVATE) 0.05 % external solution Apply 1 application topically 2 (two) times daily.  . famotidine (PEPCID) 40 MG tablet Take 40 mg by mouth daily as needed.   . fexofenadine (ALLEGRA) 180 MG tablet Take 180 mg by mouth daily as needed.   Marland Kitchen glucosamine-chondroitin 500-400 MG tablet Take 1 tablet by mouth 3 (three) times daily.  Marland Kitchen glucose blood (ONETOUCH ULTRA) test strip Use to check sugar twice daily for type 2 diabetes E11.9  . irbesartan-hydrochlorothiazide (AVALIDE) 150-12.5 MG tablet Take 1 tablet by mouth daily.  . Lancets (ONETOUCH ULTRASOFT) lancets Use to check sugar twice daily for type 2 diabetes E11.9  . metFORMIN (GLUCOPHAGE XR) 500 MG 24 hr tablet Take 1 tablet (500 mg total) by mouth daily with breakfast.  . montelukast (SINGULAIR) 10 MG tablet Take 1 tablet (10 mg total) by mouth at bedtime.  . Multiple Vitamin (MULTIVITAMIN) tablet Take 1  tablet by mouth daily.  . Naproxen Sodium (ALEVE) 220 MG CAPS Take by mouth daily as needed.  Marland Kitchen omalizumab Arvid Right) 150 MG/ML prefilled syringe Inject 300 mg into the skin every 14 (fourteen) days.  . Omega 3 1200 MG CAPS Take 2 capsules by mouth daily.  . sertraline (ZOLOFT) 50 MG tablet TAKE 2 TABLETS BY MOUTH  DAILY  . simvastatin (ZOCOR) 40 MG tablet Take 1 tablet (40 mg total) by mouth at bedtime.  . traZODone (DESYREL) 50 MG tablet Take 2-3 tablets (100-150 mg total) by mouth at bedtime.  . triamcinolone cream (KENALOG) 0.1 % Apply 1 application topically 2 (two)  times daily.   Marland Kitchen Ubiquinol 200 MG CAPS Take by mouth. CoQ10   Facility-Administered Medications Prior to Visit  Medication Dose Route Frequency Provider  . omalizumab Arvid Right) injection 300 mg  300 mg Subcutaneous Q28 days Valentina Shaggy, MD    Review of Systems  Constitutional: Negative.   Respiratory: Negative.   Cardiovascular: Negative.   Endocrine: Negative.   Musculoskeletal: Negative.       Objective    BP 110/71 (BP Location: Left Arm, Patient Position: Sitting, Cuff Size: Normal)   Pulse 74   Temp 98.5 F (36.9 C) (Oral)   Ht 5' (1.524 m)   Wt 157 lb 3.2 oz (71.3 kg)   SpO2 99%   BMI 30.70 kg/m    Physical Exam   General: Appearance:    Obese female in no acute distress  Eyes:    PERRL, conjunctiva/corneas clear, EOM's intact       Lungs:     Clear to auscultation bilaterally, respirations unlabored  Heart:    Normal heart rate. Normal rhythm. No murmurs, rubs, or gallops.   MS:   All extremities are intact.   Neurologic:   Awake, alert, oriented x 3. No apparent focal neurological           defect.         Results for orders placed or performed in visit on 10/26/19  POCT HgB A1C  Result Value Ref Range   Hemoglobin A1C 6.1 (A) 4.0 - 5.6 %   Est. average glucose Bld gHb Est-mCnc 128     Assessment & Plan     1. Diabetes mellitus without complication ([place]) Well controlled on current dose of metformin and diet. Continue current medications.    2. Essential (primary) hypertension Well controlled.  Continue current medications.   - Comprehensive metabolic panel - Lipid panel  3. Colon cancer screening Recommended stool test or coloscopy today but she refused both.   She is unable to get flu vaccine today due to upcoming omalizumab injection. She is to coordinate flu vaccine with her hematologist.   Anticipate diabetes follow up in 6 months if labs are unremarkable.          Lelon Huh, MD  [facility] [phone]  (phone) [phone] (fax)  [place]

## 2019-10-27 ENCOUNTER — Ambulatory Visit: Payer: Self-pay

## 2019-10-27 ENCOUNTER — Other Ambulatory Visit: Payer: Self-pay

## 2019-10-27 DIAGNOSIS — L508 Other urticaria: Secondary | ICD-10-CM

## 2019-10-27 LAB — COMPREHENSIVE METABOLIC PANEL
ALT: 20 IU/L (ref 0–32)
AST: 18 IU/L (ref 0–40)
Albumin/Globulin Ratio: 1.4 (ref 1.2–2.2)
Albumin: 4.3 g/dL (ref 3.8–4.8)
Alkaline Phosphatase: 69 IU/L (ref 44–121)
BUN/Creatinine Ratio: 15 (ref 12–28)
BUN: 12 mg/dL (ref 8–27)
Bilirubin Total: 0.4 mg/dL (ref 0.0–1.2)
CO2: 24 mmol/L (ref 20–29)
Calcium: 9.5 mg/dL (ref 8.7–10.3)
Chloride: 105 mmol/L (ref 96–106)
Creatinine, Ser: 0.79 mg/dL (ref 0.57–1.00)
GFR calc Af Amer: 90 mL/min/{1.73_m2} (ref >59)
GFR calc non Af Amer: 78 mL/min/{1.73_m2} (ref >59)
Globulin, Total: 3 g/dL (ref 1.5–4.5)
Glucose: 112 mg/dL — ABNORMAL HIGH (ref 65–99)
Potassium: 3.9 mmol/L (ref 3.5–5.2)
Sodium: 144 mmol/L (ref 134–144)
Total Protein: 7.3 g/dL (ref 6.0–8.5)

## 2019-10-27 LAB — LIPID PANEL
Chol/HDL Ratio: 4.3 ratio (ref 0.0–4.4)
Cholesterol, Total: 134 mg/dL (ref 100–199)
HDL: 31 mg/dL — ABNORMAL LOW (ref >39)
LDL Chol Calc (NIH): 67 mg/dL (ref 0–99)
Triglycerides: 218 mg/dL — ABNORMAL HIGH (ref 0–149)
VLDL Cholesterol Cal: 36 mg/dL (ref 5–40)

## 2019-10-27 NOTE — Progress Notes (Signed)
Immunotherapy   Patient Details  Name: Julia Garner MRN: [id number] Date of Birth: 12-24-1950  10/27/2019  Julia Garner is here for training to self administer Xolair injections. Patient instructed on proper injection technique and demonstrated proper technique in office. She did self administer both injections. Patient did not wait in office and was instructed to call us back with any questions as well as where to purchase sharps containers.  Deborra Medina 10/27/2019, 11:37 AM

## 2019-10-28 ENCOUNTER — Telehealth: Payer: Self-pay | Admitting: Family Medicine

## 2019-10-28 NOTE — Telephone Encounter (Signed)
Copied from CRM [id number]. Topic: Medicare AWV >> Oct 28, 2019  1:52 PM Claudette Laws R wrote: Reason for CRM:  Left message for patient to call back and schedule Medicare Annual Wellness Visit (AWV) either virtually or in office.  Last AWV 11/04/2018  Please schedule at anytime with [facility] Health Advisor.  If any questions, please contact me at [phone]

## 2019-10-31 ENCOUNTER — Other Ambulatory Visit: Payer: Self-pay | Admitting: Family Medicine

## 2019-11-03 ENCOUNTER — Other Ambulatory Visit: Payer: Self-pay | Admitting: *Deleted

## 2019-11-03 MED ORDER — EPINEPHRINE 0.3 MG/0.3ML IJ SOAJ: 0.3000 mg | Freq: Once | INTRAMUSCULAR | 2 refills | Status: AC

## 2019-11-03 NOTE — Telephone Encounter (Signed)
Patient called and advised [employer] patient assistance will not release rx to deliver to patient without documentation.  I called and answered questions at pharmacy and called patient back and advised she can now call and order same. I also inquired if patient had up to date epipen and she advised she had never been given one so I advised her she had to have same on hand when she administered her Xolair syringe in home and sent rx for same to [facility]

## 2019-11-04 ENCOUNTER — Ambulatory Visit: Payer: Self-pay | Admitting: Family Medicine

## 2019-11-05 ENCOUNTER — Ambulatory Visit (INDEPENDENT_AMBULATORY_CARE_PROVIDER_SITE_OTHER): Payer: Medicare Other

## 2019-11-05 ENCOUNTER — Other Ambulatory Visit: Payer: Self-pay

## 2019-11-05 DIAGNOSIS — Z23 Encounter for immunization: Secondary | ICD-10-CM | POA: Diagnosis not present

## 2019-11-24 ENCOUNTER — Telehealth: Payer: Self-pay | Admitting: Family Medicine

## 2019-11-24 NOTE — Telephone Encounter (Signed)
I don't see this medication listed as an active medication. Please advise.

## 2019-11-24 NOTE — Telephone Encounter (Signed)
[facility] faxed refill request for the following medications:  clobetasol Prop. Gel triamcinolone cream    Please advise. Thanks, [employer]

## 2019-11-25 NOTE — Telephone Encounter (Signed)
I don't see the clobetasol gel. I need to see the fax.

## 2019-11-25 NOTE — Telephone Encounter (Signed)
Julia Garner, Dr. Sherrie Mustache needs to see the actual fax that came though for this prescription.

## 2019-11-26 ENCOUNTER — Telehealth: Payer: Self-pay | Admitting: Family Medicine

## 2019-11-26 NOTE — Telephone Encounter (Signed)
[facility] faxed refill request for the following medications:  clobetasol prop. Gel  triamcinolone cream  Please advise. Thanks, [employer]

## 2019-11-26 NOTE — Telephone Encounter (Signed)
These medications aren't listed on her active medication list. Please provide Dr. Sherrie Mustache with a print out of the actual fax for review. Thanks.

## 2019-12-04 NOTE — Telephone Encounter (Signed)
PT is calling to F/UP on this request / please advise

## 2019-12-08 MED ORDER — TRIAMCINOLONE ACETONIDE 0.1 % EX CREA
1.0000 | TOPICAL_CREAM | Freq: Two times a day (BID) | CUTANEOUS | 4 refills | Status: DC
Start: 2019-12-08 — End: 2021-07-26

## 2019-12-08 NOTE — Telephone Encounter (Signed)
Patient advised.

## 2019-12-08 NOTE — Telephone Encounter (Signed)
Patient called again about this. She does not need the clobetasol gel. She is requesting the triamcinolone cream sent into [facility]. She reports that this was originally prescribed by her dermatologist but she has not seen them in a few years. Patient is also wanting a call to let her know this was done.Thanks!

## 2019-12-08 NOTE — Telephone Encounter (Signed)
Please advise 

## 2019-12-08 NOTE — Telephone Encounter (Signed)
Triamcinolone prescription has been sent to [facility]

## 2019-12-20 ENCOUNTER — Other Ambulatory Visit: Payer: Self-pay | Admitting: Family Medicine

## 2020-01-12 ENCOUNTER — Telehealth: Payer: Self-pay | Admitting: *Deleted

## 2020-01-12 NOTE — Telephone Encounter (Signed)
Patient called and stated that she will be leaving the country mid February for 6-8 weeks and is wondering what will be the best course of action for her Xolair injections while she is gone. Please advise.

## 2020-01-13 ENCOUNTER — Inpatient Hospital Stay: Payer: Medicare Other | Attending: Oncology

## 2020-01-13 ENCOUNTER — Encounter: Payer: Self-pay | Admitting: Oncology

## 2020-01-13 ENCOUNTER — Inpatient Hospital Stay (HOSPITAL_BASED_OUTPATIENT_CLINIC_OR_DEPARTMENT_OTHER): Payer: Medicare Other | Admitting: Oncology

## 2020-01-13 VITALS — BP 110/73 | HR 69 | Temp 98.5°F | Resp 16 | Wt 157.6 lb

## 2020-01-13 DIAGNOSIS — D6959 Other secondary thrombocytopenia: Secondary | ICD-10-CM | POA: Diagnosis not present

## 2020-01-13 DIAGNOSIS — D693 Immune thrombocytopenic purpura: Secondary | ICD-10-CM | POA: Diagnosis not present

## 2020-01-13 DIAGNOSIS — Z7984 Long term (current) use of oral hypoglycemic drugs: Secondary | ICD-10-CM | POA: Insufficient documentation

## 2020-01-13 DIAGNOSIS — R5383 Other fatigue: Secondary | ICD-10-CM | POA: Diagnosis not present

## 2020-01-13 DIAGNOSIS — K760 Fatty (change of) liver, not elsewhere classified: Secondary | ICD-10-CM | POA: Insufficient documentation

## 2020-01-13 DIAGNOSIS — Z79899 Other long term (current) drug therapy: Secondary | ICD-10-CM | POA: Diagnosis not present

## 2020-01-13 DIAGNOSIS — G5603 Carpal tunnel syndrome, bilateral upper limbs: Secondary | ICD-10-CM | POA: Insufficient documentation

## 2020-01-13 DIAGNOSIS — I10 Essential (primary) hypertension: Secondary | ICD-10-CM | POA: Insufficient documentation

## 2020-01-13 DIAGNOSIS — F32A Depression, unspecified: Secondary | ICD-10-CM | POA: Insufficient documentation

## 2020-01-13 LAB — CBC WITH DIFFERENTIAL/PLATELET
Abs Immature Granulocytes: 0.03 10*3/uL (ref 0.00–0.07)
Basophils Absolute: 0.1 10*3/uL (ref 0.0–0.1)
Basophils Relative: 1 %
Eosinophils Absolute: 0.9 10*3/uL — ABNORMAL HIGH (ref 0.0–0.5)
Eosinophils Relative: 11 %
HCT: 42.4 % (ref 36.0–46.0)
Hemoglobin: 14 g/dL (ref 12.0–15.0)
Immature Granulocytes: 0 %
Lymphocytes Relative: 43 %
Lymphs Abs: 3.6 10*3/uL (ref 0.7–4.0)
MCH: 28.6 pg (ref 26.0–34.0)
MCHC: 33 g/dL (ref 30.0–36.0)
MCV: 86.7 fL (ref 80.0–100.0)
Monocytes Absolute: 0.6 10*3/uL (ref 0.1–1.0)
Monocytes Relative: 8 %
Neutro Abs: 3 10*3/uL (ref 1.7–7.7)
Neutrophils Relative %: 37 %
Platelets: 125 10*3/uL — ABNORMAL LOW (ref 150–400)
RBC: 4.89 MIL/uL (ref 3.87–5.11)
RDW: 13.2 % (ref 11.5–15.5)
WBC: 8.2 10*3/uL (ref 4.0–10.5)
nRBC: 0 % (ref 0.0–0.2)

## 2020-01-13 LAB — COMPREHENSIVE METABOLIC PANEL
ALT: 23 U/L (ref 0–44)
AST: 21 U/L (ref 15–41)
Albumin: 3.9 g/dL (ref 3.5–5.0)
Alkaline Phosphatase: 52 U/L (ref 38–126)
Anion gap: 11 (ref 5–15)
BUN: 19 mg/dL (ref 8–23)
CO2: 26 mmol/L (ref 22–32)
Calcium: 9 mg/dL (ref 8.9–10.3)
Chloride: 101 mmol/L (ref 98–111)
Creatinine, Ser: 0.66 mg/dL (ref 0.44–1.00)
GFR, Estimated: >60 mL/min (ref >60)
Glucose, Bld: 118 mg/dL — ABNORMAL HIGH (ref 70–99)
Potassium: 4 mmol/L (ref 3.5–5.1)
Sodium: 138 mmol/L (ref 135–145)
Total Bilirubin: 0.7 mg/dL (ref 0.3–1.2)
Total Protein: 7.7 g/dL (ref 6.5–8.1)

## 2020-01-13 NOTE — Progress Notes (Signed)
Patient denies new problems/concerns today.   °

## 2020-01-13 NOTE — Progress Notes (Signed)
Hematology/Oncology Follow up visit [facility] [phone] [phone] Patient Care Team: Birdie Sons, MD as PCP - General (Family Medicine) Anell Barr, OD (Optometry)  REASON FOR VISIT Follow up for treatment of thrombocytopenia  PERTINENT HEMATOLOGY HISTORY Julia Garner 69 y.o.  female who has a history of recurrent ITP. Before she was referred to me, she has a long history of ITP which were treated with sterids by primary care physician and she used to respond to steroids. She was seen by anther Hematologist Dr.Pandit many years ago. Dr.Pandit has left [place].  She had labs doneon 08/22/2016. Platelet counts were slow at 15,000. She was treated with a short course (5 days) of Steroids by Dr.Fisher and platelet recovered to 120,000, however later dropped again to 11,000 on 09/10/2016.  I treated her with a longer course of prednisone with slow tapering and her counts again responded to treatment. During the tapering the course,she visited home country [place] and had blood work done there after she finished the tapering course of Prednisone there. She reports the platelet counts were normal in [place].   Patient has had lab work up including negative hepatitis, HIV, normal LDH, normal B12 and folate level. ANA was positive and she was evaluated by rheumatologist. Based on my phone discussion with Rheumatologist, patient has had rheumatology work up and was not considered to have any autoimmune problems. She does not have hepatosplenomegaly.   Patient returned to clinic in December and repeat blood work on 12/26/2016 showed platelet count of 16,000. Treated with a course of Dexamethasone 54m x 4 days, platelet again responded and dropped again.  And she reports easy bruising, feeling tired.  Peripheral blood flowcytometry 1% of analyzed cells containing clonal B cell population which are CD5- and CD 10-.  There is mildly increased  eosinophils 7%.   Patient does not have any palpable lymphadenopathy on physical examination and we obtained CT which showed no pathological lymphadenopathy. Bone marrow biopsy showed hypercellular marrow with trilineage hematopoiesis including increased megakaryocytes. No evidence of lymphoproliferative process involvement. Normal cytogenetics.  At this point patient has had extensive work up for her thrombocytopenia, and we discussed in lengthy that she most likely have ITP, there maybe a low grade lymphoma too. Given that she has recurrent ITP and platelet counts cannot be maintained without steroids, I suggest Rituximab weekly x 4 for consolidation. Patient has requested me to talk to her son in law Dr.[place] I have talked to Dr.Kamron about the rationale of using Rituximab and also the side effects profile. Dr.Kamron agrees the plan and has relayed information to patient. I have also discussed the side effects of Rituximab with patient. She agrees with treatment.   # severe bilateral carpal tunnel syndrome and received joint steroid injections.  INTERVAL HISTORY Julia Garner a 69y.o. female who has above hematology history reviewed by me today presents for follow up visit for ITP.  Patient has no new complaints. Denies any bleeding events. [place] to come [place] in March 2022.   ROS:  Review of Systems  Constitutional: Negative for appetite change, chills, fatigue and fever.  HENT:   Negative for hearing loss and voice change.   Eyes: Negative for eye problems.  Respiratory: Negative for chest tightness and cough.   Cardiovascular: Negative for chest pain.  Gastrointestinal: Negative for abdominal distention, abdominal pain and blood in stool.  Endocrine: Negative for hot flashes.  Genitourinary: Negative for difficulty urinating and frequency.   Musculoskeletal: Negative for arthralgias.        Bilateral carpal tunnel syndrome  Skin: Negative for itching and rash.  Neurological: Negative for extremity weakness.  Hematological: Negative for adenopathy.  Psychiatric/Behavioral: Negative for confusion.    MEDICAL HISTORY:  Past Medical History:  Diagnosis Date  . Allergy   . Chronic ITP (idiopathic thrombocytopenia) (HCC)   . Chronic urticaria 08/15/2018  . Depression   . Head injuries   . History of seizure 2007   one seizure   . Hypertension     SURGICAL HISTORY: Past Surgical History:  Procedure Laterality Date  . CATARACT EXTRACTION, BILATERAL  around 2021   Dr. Ellin Mayhew  . CESAREAN SECTION  1990  . CHOLECYSTECTOMY  2005  . CYST EXCISION  1996   Tracheal Cyst    SOCIAL HISTORY: Social History   Socioeconomic History  . Marital status: Married    Spouse name: Not on file  . Number of children: 3  . Years of education: Not on file  . Highest education level: Not on file  Occupational History  . Occupation: retired    Comment: Community education officer  Tobacco Use  . Smoking status: Never Smoker  . Smokeless tobacco: Never Used  Vaping Use  . Vaping Use: Never used  Substance and Sexual Activity  . Alcohol use: No    Alcohol/week: 0.0 standard drinks  . Drug use: No  . Sexual activity: Not on file  Other Topics Concern  . Not on file  Social History Narrative  . Not on file   Social Determinants of Health   Financial Resource Strain: Not on file  Food Insecurity: Not on file  Transportation Needs: Not on file  Physical Activity: Not on file  Stress: Not on file  Social Connections: Not on file  Intimate Partner Violence: Not on file    FAMILY HISTORY: Family History  Problem Relation Age of Onset  . Heart attack Father        1st MI at age 48    ALLERGIES:  is allergic to niacin and related, penicillins, and tylenol  [acetaminophen].  MEDICATIONS:  Current Outpatient Medications  Medication Sig Dispense Refill  . Apoaequorin (PREVAGEN  PO) Take 1 tablet by mouth Garner.    Marland Kitchen b complex vitamins capsule Take 1 capsule by mouth Garner.    Marland Kitchen Bioflavonoid Products (VITAMIN C PLUS) 500 MG TABS Take 2 tablets by mouth Garner.     . Biotin 1000 MCG tablet Take 1,000 mcg by mouth Garner.    . Blood Glucose Monitoring Suppl (ONE TOUCH ULTRA 2) w/Device KIT Use to check sugar Garner for type 2 diabetes E11.9 1 kit 0  . Calcium Carbonate-Vit D-Min (CALCIUM 1200 PO) Take by mouth.     . cetirizine (ZYRTEC) 10 MG tablet Take 20 mg by mouth at bedtime as needed.     . cetirizine-pseudoephedrine (ZYRTEC-D) 5-120 MG tablet Take 1 tablet by mouth 2 (two) times Garner. (Patient taking differently: Take 1 tablet by mouth 2 (two) times Garner as needed.) 60 tablet 5  . clobetasol (TEMOVATE) 0.05 % external solution Apply 1 application topically 2 (two) times Garner.    . famotidine (PEPCID) 40 MG tablet Take 40 mg by mouth Garner as needed.     . fexofenadine (ALLEGRA) 180 MG tablet Take 180 mg by mouth Garner as needed.     Marland Kitchen glucosamine-chondroitin 500-400 MG tablet  Take 1 tablet by mouth 3 (three) times Garner.    Marland Kitchen glucose blood (ONETOUCH ULTRA) test strip Use to check sugar twice Garner for type 2 diabetes E11.9 100 each 4  . irbesartan-hydrochlorothiazide (AVALIDE) 150-12.5 MG tablet TAKE 1 TABLET BY MOUTH  Garner 90 tablet 1  . Lancets (ONETOUCH ULTRASOFT) lancets Use to check sugar twice Garner for type 2 diabetes E11.9 100 each 4  . metFORMIN (GLUCOPHAGE XR) 500 MG 24 hr tablet Take 1 tablet (500 mg total) by mouth Garner with breakfast. 90 tablet 3  . montelukast (SINGULAIR) 10 MG tablet Take 1 tablet (10 mg total) by mouth at bedtime. 30 tablet 5  . Multiple Vitamin (MULTIVITAMIN) tablet Take 1 tablet by mouth Garner.    . Naproxen Sodium 220 MG CAPS Take by mouth Garner as needed.    Marland Kitchen omalizumab Arvid Right) 150 MG/ML prefilled syringe Inject 300 mg into the skin every 14 (fourteen) days. 4 mL 11  . Omega 3 1200 MG CAPS Take 2 capsules by mouth Garner.     . sertraline (ZOLOFT) 50 MG tablet TAKE 2 TABLETS BY MOUTH  Garner 180 tablet 0  . simvastatin (ZOCOR) 40 MG tablet TAKE 1 TABLET BY MOUTH AT  BEDTIME 90 tablet 1  . traZODone (DESYREL) 50 MG tablet Take 2-3 tablets (100-150 mg total) by mouth at bedtime.    . triamcinolone (KENALOG) 0.1 % Apply 1 application topically 2 (two) times Garner. 80 g 4  . Ubiquinol 200 MG CAPS Take by mouth. CoQ10     Current Facility-Administered Medications  Medication Dose Route Frequency Provider Last Rate Last Admin  . omalizumab Arvid Right) injection 300 mg  300 mg Subcutaneous Q28 days Valentina Shaggy, MD   300 mg at 09/29/19 1523      .  PHYSICAL EXAMINATION: ECOG PERFORMANCE STATUS: 0 - Asymptomatic Vitals:   01/13/20 1000  BP: 110/73  Pulse: 69  Resp: 16  Temp: 98.5 F (36.9 C)   Filed Weights   01/13/20 1000  Weight: 157 lb 9.6 oz (71.5 kg)  Physical Exam Constitutional:      General: She is not in acute distress.    Appearance: She is not diaphoretic.  HENT:     Head: Normocephalic and atraumatic.     Nose: Nose normal.     Mouth/Throat:     Pharynx: No oropharyngeal exudate.  Eyes:     General: No scleral icterus.       Left eye: No discharge.     Conjunctiva/sclera: Conjunctivae normal.     Pupils: Pupils are equal, round, and reactive to light.  Neck:     Vascular: No JVD.  Cardiovascular:     Rate and Rhythm: Normal rate and regular rhythm.     Heart sounds: Normal heart sounds. No murmur heard.   Pulmonary:     Effort: Pulmonary effort is normal. No respiratory distress.     Breath sounds: Normal breath sounds. No wheezing or rales.  Chest:     Chest wall: No tenderness.  Abdominal:     General: Bowel sounds are normal. There is no distension.     Palpations: Abdomen is soft. There is no mass.     Tenderness: There is no abdominal tenderness. There is no rebound.  Musculoskeletal:        General: No tenderness. Normal range of motion.     Cervical back: Normal  range of motion and neck supple.  Lymphadenopathy:     Cervical: No cervical  adenopathy.  Skin:    General: Skin is dry.     Findings: No rash.  Neurological:     Mental Status: She is alert and oriented to person, place, and time.     Cranial Nerves: No cranial nerve deficit.     Motor: No abnormal muscle tone.     Coordination: Coordination normal.  Psychiatric:        Mood and Affect: Affect normal.        Cognition and Memory: Memory normal.        Judgment: Judgment normal.          LABORATORY DATA:  I have reviewed the data as listed Lab Results  Component Value Date   WBC 8.2 01/13/2020   HGB 14.0 01/13/2020   HCT 42.4 01/13/2020   MCV 86.7 01/13/2020   PLT 125 (L) 01/13/2020   Recent Labs    04/13/19 1431 07/14/19 0950 10/26/19 0855 01/13/20 0940  NA 136 139 144 138  K 3.4* 3.9 3.9 4.0  CL 102 101 105 101  CO2 27 29 24 26   GLUCOSE 111* 146* 112* 118*  BUN 14 14 12 19   CREATININE 0.66 0.77 0.79 0.66  CALCIUM 9.1 9.2 9.5 9.0  GFRNONAA >60 >60 78 >60  GFRAA >60 >60 90  --   PROT 7.8 7.8 7.3 7.7  ALBUMIN 3.7 3.9 4.3 3.9  AST 26 27 18 21   ALT 33 30 20 23   ALKPHOS 55 56 69 52  BILITOT 0.5 0.8 0.4 0.7     RADIOGRAPHIC STUDIES: I have personally reviewed the radiological images as listed and agreed with the findings in the report.   ultrasound of the abdomen showed  no spleenmegaly. Fatty liver disease.  01/17/2017 CT chest abdomen pelvis with contrast showed no pathological adenopathy was identified, aortic atherosclerosis, three-vessel coronary artery atherosclerotic calcification. Cholecystectomy, small bladder cystocele due to pelvic floor laxity. Prominent stool throughout the colon favors constipation.   ASSESSMENT & PLAN:  1. Chronic ITP (idiopathic thrombocytopenia) (HCC)    Thrombocytopenia, secondary to chronic ITP, status post rituximab treatments Patient is doing very well clinically. Labs are reviewed and discussed with patient. Her  platelet counts are 125,000 today, this is close to her baseline. Continue observation  #Fatty liver disease, no transaminitis.  Diet and exercise.  All questions were answered. Repeat CBC in 3 months and follow-up lab MD in 6 months.   Earlie Server, MD, PhD Hematology Oncology [facility] Pager- [phone] 01/13/2020

## 2020-01-18 NOTE — Telephone Encounter (Signed)
I would definitely make sure she gets 1 as close to when she is moving the country as she can.  I would also recommend that she restart her suppressive antihistamines while on her trip: Zyrtec (cetirizine) 10 mg twice daily as well as Pepcid (famotidine) 20 mg twice daily.  We can also give her a baby prednisone pack or two to have on hand during her trip in case she has outbreaks.  Malachi Bonds, MD [facility]

## 2020-01-19 NOTE — Telephone Encounter (Signed)
Patient was advise and will collect the Baby pack sometime this week

## 2020-01-23 ENCOUNTER — Other Ambulatory Visit: Payer: Self-pay | Admitting: Family Medicine

## 2020-01-23 DIAGNOSIS — E119 Type 2 diabetes mellitus without complications: Secondary | ICD-10-CM

## 2020-01-23 DIAGNOSIS — G4700 Insomnia, unspecified: Secondary | ICD-10-CM

## 2020-01-23 MED ORDER — IRBESARTAN-HYDROCHLOROTHIAZIDE 150-12.5 MG PO TABS: 1.0000 | ORAL_TABLET | Freq: Every day | ORAL | 0 refills | Status: DC

## 2020-01-23 MED ORDER — SERTRALINE HCL 50 MG PO TABS: 100.0000 mg | ORAL_TABLET | Freq: Every day | ORAL | 0 refills | Status: DC

## 2020-01-23 MED ORDER — METFORMIN HCL ER 500 MG PO TB24: 500.0000 mg | ORAL_TABLET | Freq: Every day | ORAL | 0 refills | Status: DC

## 2020-01-23 MED ORDER — SIMVASTATIN 40 MG PO TABS: 40.0000 mg | ORAL_TABLET | Freq: Every day | ORAL | 0 refills | Status: DC

## 2020-01-23 NOTE — Telephone Encounter (Signed)
Received call from [facility] that patient called from [facility] where she is on vacation for two weeks and forget to bring all of her medications. Needs prescription for 2 weeks supply of all main meds to [employer]

## 2020-02-04 ENCOUNTER — Other Ambulatory Visit: Payer: Self-pay | Admitting: Family Medicine

## 2020-02-04 ENCOUNTER — Telehealth: Payer: Self-pay | Admitting: *Deleted

## 2020-02-04 NOTE — Telephone Encounter (Signed)
Copied from CRM [id number]. Topic: Quick Communication - Rx Refill/Question >> Feb 04, 2020 11:55 AM Marylen Ponto wrote: Medication: clobetasol (TEMOVATE) 0.05 % external solution  Has the patient contacted their pharmacy? no - Pt stated the Rx was from previous doctor but Dr. Sherrie Mustache told her he could refill it  Preferred Pharmacy (with phone number or street name): [facility] [address] [place], [place] - [address]  Phone: [phone]   Fax: [phone]  Agent: Please be advised that RX refills may take up to 3 business days. We ask that you follow-up with your pharmacy.

## 2020-02-04 NOTE — Chronic Care Management (AMB) (Signed)
  Chronic Care Management   Note  02/04/2020 Name: Staphany Ditton MRN: [id number] DOB: [birth year]  Will Schier is a 70 y.o. year old female who is a primary care patient of Fisher, Kirstie Peri, MD. I reached out to [employer] by phone today in response to a referral sent by Ms. Zaray Sorbo's health plan.     Ms. Alfonzo was given information about Chronic Care Management services today including:  1. CCM service includes personalized support from designated clinical staff supervised by her physician, including individualized plan of care and coordination with other care providers 2. 24/7 contact phone numbers for assistance for urgent and routine care needs. 3. Service will only be billed when office clinical staff spend 20 minutes or more in a month to coordinate care. 4. Only one practitioner may furnish and bill the service in a calendar month. 5. The patient may stop CCM services at any time (effective at the end of the month) by phone call to the office staff. 6. The patient will be responsible for cost sharing (co-pay) of up to 20% of the service fee (after annual deductible is met).  Patient did not agree to enrollment in care management services and does not wish to consider at this time.  Follow up plan: The care management team is available to follow up with the patient after provider conversation with the patient regarding recommendation for care management engagement and subsequent re-referral to the care management team.   [facility]

## 2020-02-04 NOTE — Telephone Encounter (Signed)
Requested medication (s) are due for refill today: yes  Requested medication (s) are on the active medication list: yes  Last refill:  last filled by historical provider  Future visit scheduled: yes  Notes to clinic:  Please review for refill. Last filled by historical provider    Requested Prescriptions  Pending Prescriptions Disp Refills   clobetasol (TEMOVATE) 0.05 % external solution 50 mL     Sig: Apply 1 application topically 2 (two) times daily.      Dermatology:  Corticosteroids Passed - 02/04/2020 12:09 PM      Passed - Valid encounter within last 12 months    Recent Outpatient Visits           3 months ago Diabetes mellitus without complication [facility])   [facility] Malva Limes, MD   6 months ago Diabetes mellitus without complication [facility])   [facility]va L[facility], MD   9 months ago Diabetes mellitus without complication [facility])   [facility] Malva Limes, MD   1 year ago Annual physical exam   [facility] Malva Limes, MD   1 year ago Dermatitis   [facility] Malva Limes, MD       Future Appointments             In 2 months Fisher, Demetrios Isaacs, MD [facility]

## 2020-02-08 ENCOUNTER — Telehealth: Payer: Self-pay | Admitting: *Deleted

## 2020-02-08 MED ORDER — OMALIZUMAB 150 MG/ML ~~LOC~~ SOSY: 300.0000 mg | PREFILLED_SYRINGE | SUBCUTANEOUS | 11 refills | Status: DC

## 2020-02-08 NOTE — Telephone Encounter (Signed)
Patient called and advised out of country for 8 weeks and needs Rx to [facility] for her syringes for 2 months to take with her along with note from MD to carry same on plane. Rx sent and letter mailed to patient

## 2020-02-09 DIAGNOSIS — G5603 Carpal tunnel syndrome, bilateral upper limbs: Secondary | ICD-10-CM | POA: Diagnosis not present

## 2020-02-10 ENCOUNTER — Other Ambulatory Visit: Payer: Self-pay | Admitting: *Deleted

## 2020-02-10 MED ORDER — OMALIZUMAB 150 MG/ML ~~LOC~~ SOSY: 300.0000 mg | PREFILLED_SYRINGE | SUBCUTANEOUS | 3 refills | Status: DC

## 2020-02-11 ENCOUNTER — Encounter: Payer: Self-pay | Admitting: Allergy & Immunology

## 2020-02-11 ENCOUNTER — Telehealth: Payer: Self-pay | Admitting: Family Medicine

## 2020-02-11 NOTE — Telephone Encounter (Signed)
Copied from CRM [id number]. Topic: Medicare AWV >> Feb 11, 2020  3:40 PM Claudette Laws R wrote: Reason for CRM:  Left message for patient to call back and schedule Medicare Annual Wellness Visit (AWV) in office.   If not able to come in office, please offer to do virtually or by telephone.   Last AWV 11/04/2018  Please schedule at anytime with [facility] Health Advisor.  If any questions, please contact me at [phone]

## 2020-02-23 MED ORDER — CLOBETASOL PROPIONATE 0.05 % EX SOLN: 1.0000 "application " | Freq: Two times a day (BID) | CUTANEOUS | 6 refills | Status: DC

## 2020-02-26 ENCOUNTER — Telehealth: Payer: Self-pay | Admitting: Family Medicine

## 2020-02-26 NOTE — Telephone Encounter (Signed)
Tried calling patient. I don't see where out office sent any recent prescriptions for lancets. Need to verify with patient which supplies she needs sent into the pharmacy. Left message to call back.

## 2020-02-26 NOTE — Telephone Encounter (Signed)
paitent is requesting lancets to be refilled. Order is for [facility] ULTRASOFT lancets and patient needs ONE TOUCH ULTRA 2 lancets 33 gauge. Patient states old lancets will not fit her new meter. Patient is going out of the country on Wednesday and needs lancets sent into [facility] mail order today to receive by next week. Please order one touch ultra 2 lancets 33 gauge.

## 2020-02-26 NOTE — Telephone Encounter (Signed)
Pt called stating that she received lancets for the ultra soft touch machine and is needing them instead for the ultra 2. Pt states that she is going out of town for 6 weeks and is needing to have this sent in to pharmacy in the next hour if possible. Please advise.       [facility] SERVICE - [place], [place] - [address] [place], [address]  [address] [place], [address] [place] [address]  Phone: [phone] Fax: [phone]  Hours: Not open 24 hours

## 2020-02-29 NOTE — Telephone Encounter (Signed)
I was unable to find a one touch ultra 2 lancet in the system. I called the pharmacy and was advised that that is the name of the meter. The lancets that go with that meter are called One touch Delica plus lancets 33G. Pharmacist states that they already spoke with patient about this on 02/26/2020, and this lancet has already been shipped out to the patient.

## 2020-04-04 ENCOUNTER — Telehealth: Payer: Self-pay | Admitting: Family Medicine

## 2020-04-04 NOTE — Telephone Encounter (Signed)
Copied from CRM [id number]. Topic: Medicare AWV >> Apr 04, 2020  1:40 PM Claudette Laws R wrote: Reason for CRM:   No answer unable to leave message for patient to call back and schedule Medicare Annual Wellness Visit (AWV) in office.   If not able to come in office, please offer to do virtually or by telephone.   Last AWV:  11/04/2018  Please schedule at anytime with [facility] Health Advisor.  If any questions, please contact me at [phone]

## 2020-04-06 ENCOUNTER — Other Ambulatory Visit: Payer: Self-pay | Admitting: Family Medicine

## 2020-04-06 NOTE — Telephone Encounter (Signed)
Requested Prescriptions  Pending Prescriptions Disp Refills  . sertraline (ZOLOFT) 50 MG tablet [Pharmacy Med Name: Sertraline HCl 50 MG Oral Tablet] 180 tablet 0    Sig: TAKE 2 TABLETS BY MOUTH  DAILY     Psychiatry:  Antidepressants - SSRI Passed - 04/06/2020  5:45 AM      Passed - Valid encounter within last 6 months    Recent Outpatient Visits          5 months ago Diabetes mellitus without complication [facility])   [facility] Malva Limes, MD   8 months ago Diabetes mellitus without complication [facility])   [facility] Malva Limes, MD   11 months ago Diabetes mellitus without complication [facility])   [facility] Malva Limes, MD   1 year ago Annual physical exam   [facility] Malva Limes, MD   2 years ago Dermatitis   [facility] Malva Limes, MD      Future Appointments            In 3 weeks Fisher, Demetrios Isaacs, MD [facility]

## 2020-04-18 ENCOUNTER — Telehealth: Payer: Self-pay | Admitting: Oncology

## 2020-04-18 NOTE — Telephone Encounter (Signed)
OK to move lab appt out 1 month due to patient taking a 2nd round of steroid?

## 2020-04-19 NOTE — Telephone Encounter (Signed)
Done.. Pt will RTC on 05/20/20 for labs Pt was made aware.

## 2020-04-19 NOTE — Telephone Encounter (Addendum)
Please contact patient to r/s labs out 1 month.

## 2020-04-22 ENCOUNTER — Other Ambulatory Visit: Payer: Medicare Other

## 2020-04-28 DIAGNOSIS — M368 Systemic disorders of connective tissue in other diseases classified elsewhere: Secondary | ICD-10-CM | POA: Diagnosis not present

## 2020-04-28 DIAGNOSIS — M359 Systemic involvement of connective tissue, unspecified: Secondary | ICD-10-CM | POA: Diagnosis not present

## 2020-04-28 DIAGNOSIS — D693 Immune thrombocytopenic purpura: Secondary | ICD-10-CM | POA: Diagnosis not present

## 2020-04-28 DIAGNOSIS — R768 Other specified abnormal immunological findings in serum: Secondary | ICD-10-CM | POA: Diagnosis not present

## 2020-04-28 DIAGNOSIS — Z7952 Long term (current) use of systemic steroids: Secondary | ICD-10-CM | POA: Diagnosis not present

## 2020-04-28 LAB — BASIC METABOLIC PANEL
BUN: 12 (ref 4–21)
CO2: 24 — AB (ref 13–22)
Chloride: 100 (ref 99–108)
Creatinine: 0.9 (ref 0.5–1.1)
Glucose: 89
Potassium: 3.5 (ref 3.4–5.3)
Sodium: 139 (ref 137–147)

## 2020-04-28 LAB — COMPREHENSIVE METABOLIC PANEL
Albumin: 4 (ref 3.5–5.0)
Calcium: 9.1 (ref 8.7–10.7)
GFR calc non Af Amer: 72
Globulin: 3.1

## 2020-04-29 ENCOUNTER — Encounter: Payer: Self-pay | Admitting: Family Medicine

## 2020-04-29 ENCOUNTER — Ambulatory Visit (INDEPENDENT_AMBULATORY_CARE_PROVIDER_SITE_OTHER): Payer: Medicare Other | Admitting: Family Medicine

## 2020-04-29 ENCOUNTER — Other Ambulatory Visit: Payer: Self-pay

## 2020-04-29 VITALS — BP 91/60 | HR 64 | Temp 97.7°F | Resp 16 | Wt 157.0 lb

## 2020-04-29 DIAGNOSIS — R5383 Other fatigue: Secondary | ICD-10-CM

## 2020-04-29 DIAGNOSIS — I10 Essential (primary) hypertension: Secondary | ICD-10-CM | POA: Diagnosis not present

## 2020-04-29 DIAGNOSIS — E119 Type 2 diabetes mellitus without complications: Secondary | ICD-10-CM | POA: Diagnosis not present

## 2020-04-29 LAB — POCT GLYCOSYLATED HEMOGLOBIN (HGB A1C)
Est. average glucose Bld gHb Est-mCnc: 137
Hemoglobin A1C: 6.4 % — AB (ref 4.0–5.6)

## 2020-04-29 LAB — CBC AND DIFFERENTIAL
Basophils Absolute: 0
EOS (ABSOLUTE): 0.4
HCT: 45 (ref 36–46)
Hemoglobin: 14.7 (ref 12.0–16.0)
Lymphs Abs: 5.7
Monocytes Absolute: 1
Neutrophil #: 4.4
Platelets: 165 (ref 150–399)
RBC: 4.98 (ref 3.87–5.11)
WBC: 11.4

## 2020-04-29 MED ORDER — IRBESARTAN-HYDROCHLOROTHIAZIDE 150-12.5 MG PO TABS: ORAL_TABLET | ORAL | Status: DC

## 2020-04-29 NOTE — Progress Notes (Signed)
Established patient visit   Patient: Julia Garner   DOB: [birth year]   70 y.o. Female  MRN: [id number] Visit Date: 04/29/2020  Today's healthcare provider: Lelon Huh, MD   Chief Complaint  Patient presents with  . Diabetes  . Hypertension  . Depression   Subjective    HPI  Diabetes Mellitus Type II, Follow-up  Lab Results  Component Value Date   HGBA1C 6.4 (A) 04/29/2020   HGBA1C 6.1 (A) 10/26/2019   HGBA1C 6.5 (A) 05/05/2019   Wt Readings from Last 3 Encounters:  04/29/20 157 lb (71.2 kg)  01/13/20 157 lb 9.6 oz (71.5 kg)  10/26/19 157 lb 3.2 oz (71.3 kg)   Last seen for diabetes on 10/26/2019.   Management since then includes continue same medications. She reports good compliance with treatment. She is not having side effects.  Symptoms: Yes fatigue No foot ulcerations  No appetite changes No nausea  No paresthesia of the feet  No polydipsia  No polyuria No visual disturbances   No vomiting     Home blood sugar records: fasting range: 110  Episodes of hypoglycemia? No    Current insulin regiment: none Most Recent Eye Exam: not UTD Current exercise: none Current diet habits: well balanced  Pertinent Labs: Lab Results  Component Value Date   CHOL 134 10/26/2019   HDL 31 (L) 10/26/2019   LDLCALC 67 10/26/2019   TRIG 218 (H) 10/26/2019   CHOLHDL 4.3 10/26/2019   Lab Results  Component Value Date   NA 138 01/13/2020   K 4.0 01/13/2020   CREATININE 0.66 01/13/2020   GFRNONAA >60 01/13/2020   GFRAA 90 10/26/2019   GLUCOSE 118 (H) 01/13/2020     --------------------------------------------------------------------------------------------------- Hypertension, follow-up  BP Readings from Last 3 Encounters:  04/29/20 91/60  01/13/20 110/73  10/26/19 110/71   Wt Readings from Last 3 Encounters:  04/29/20 157 lb (71.2 kg)  01/13/20 157 lb 9.6 oz (71.5 kg)  10/26/19 157 lb 3.2 oz (71.3 kg)     She was last seen for hypertension 6  months ago.  BP at that visit was 110/71. Management since that visit includes continue same medications.  She reports good compliance with treatment. She is not having side effects.  She is following a Regular diet. She is not exercising. She does not smoke.  Use of agents associated with hypertension: none.   Outside blood pressures are not checked. Symptoms: No chest pain No chest pressure  No palpitations No syncope  No dyspnea No orthopnea  No paroxysmal nocturnal dyspnea No lower extremity edema   Pertinent labs: Lab Results  Component Value Date   CHOL 134 10/26/2019   HDL 31 (L) 10/26/2019   LDLCALC 67 10/26/2019   TRIG 218 (H) 10/26/2019   CHOLHDL 4.3 10/26/2019   Lab Results  Component Value Date   NA 138 01/13/2020   K 4.0 01/13/2020   CREATININE 0.66 01/13/2020   GFRNONAA >60 01/13/2020   GFRAA 90 10/26/2019   GLUCOSE 118 (H) 01/13/2020     The 10-year ASCVD risk score Mikey Bussing DC Jr., et al., 2013) is: 9.1%   --------------------------------------------------------------------------------------------------- Depression, Follow-up  She  was last seen for this 1 year ago. Changes made at last visit include none; continue same medicatons.   She reports excellent compliance with treatment. She is not having side effects.   She reports good tolerance of treatment.   Depression screen [facility] 2/9 10/26/2019 11/04/2018  Decreased Interest 0 0  Down, Depressed,  Hopeless 0 1  PHQ - 2 Score 0 1  Altered sleeping - 3  Tired, decreased energy - 0  Change in appetite - 0  Feeling bad or failure about yourself  - 0  Trouble concentrating - 0  Moving slowly or fidgety/restless - 0  Suicidal thoughts - 0  PHQ-9 Score - 4  Difficult doing work/chores - Not difficult at all    -----------------------------------------------------------------------------------------  Fatigue: Patient complains of worsening fatigue for the past month. She recently returned from   [place]. She states she became sick a month ago with extreme fatigue and headaches while in [place] and was prescribed course of prednisone during which she felt somewhat better. She returned to the [place] at the end of April and states she got food poisoning from [place] with n/v/d. GI symptoms resolved after a few days, but has remained extremely fatigued ever since. She was put back on prednisone taper by and MD friend and felt a little better for a few days. Is now down to 64m a day and feels terrible again. She states that her physician friend ordered labs yesterday, but she hasn't got results back yet.     Medications: Outpatient Medications Prior to Visit  Medication Sig  . Apoaequorin (PREVAGEN PO) Take 1 tablet by mouth daily.  .Marland Kitchenb complex vitamins capsule Take 1 capsule by mouth daily.  .Marland KitchenBioflavonoid Products (VITAMIN C PLUS) 500 MG TABS Take 2 tablets by mouth daily.   . Biotin 1000 MCG tablet Take 1,000 mcg by mouth daily.  . Blood Glucose Monitoring Suppl (ONE TOUCH ULTRA 2) w/Device KIT Use to check sugar daily for type 2 diabetes E11.9  . Calcium Carbonate-Vit D-Min (CALCIUM 1200 PO) Take by mouth.   . cetirizine (ZYRTEC) 10 MG tablet Take 20 mg by mouth at bedtime as needed.   . cetirizine-pseudoephedrine (ZYRTEC-D) 5-120 MG tablet Take 1 tablet by mouth 2 (two) times daily. (Patient taking differently: Take 1 tablet by mouth 2 (two) times daily as needed.)  . clobetasol (TEMOVATE) 0.05 % external solution Apply 1 application topically 2 (two) times daily.  . famotidine (PEPCID) 40 MG tablet Take 40 mg by mouth daily as needed.   . fexofenadine (ALLEGRA) 180 MG tablet Take 180 mg by mouth daily as needed.   .Marland Kitchenglucosamine-chondroitin 500-400 MG tablet Take 1 tablet by mouth 3 (three) times daily.  .Marland Kitchenglucose blood (ONETOUCH ULTRA) test strip Use to check sugar twice daily for type 2 diabetes E11.9  . irbesartan-hydrochlorothiazide (AVALIDE) 150-12.5 MG tablet Take 1 tablet by mouth  daily.  . Lancets (ONETOUCH ULTRASOFT) lancets Use to check sugar twice daily for type 2 diabetes E11.9  . metFORMIN (GLUCOPHAGE XR) 500 MG 24 hr tablet Take 1 tablet (500 mg total) by mouth daily with breakfast.  . montelukast (SINGULAIR) 10 MG tablet Take 1 tablet (10 mg total) by mouth at bedtime.  . Multiple Vitamin (MULTIVITAMIN) tablet Take 1 tablet by mouth daily.  . Naproxen Sodium 220 MG CAPS Take by mouth daily as needed.  .Marland Kitchenomalizumab (Arvid Right 150 MG/ML prefilled syringe Inject 300 mg into the skin every 14 (fourteen) days.  . Omega 3 1200 MG CAPS Take 2 capsules by mouth daily.  . sertraline (ZOLOFT) 50 MG tablet TAKE 2 TABLETS BY MOUTH  DAILY  . simvastatin (ZOCOR) 40 MG tablet Take 1 tablet (40 mg total) by mouth at bedtime.  . traZODone (DESYREL) 50 MG tablet TAKE 1 TO 2 TABLETS BY  MOUTH AT BEDTIME  .  triamcinolone (KENALOG) 0.1 % Apply 1 application topically 2 (two) times daily.  Marland Kitchen Ubiquinol 200 MG CAPS Take by mouth. CoQ10   Facility-Administered Medications Prior to Visit  Medication Dose Route Frequency Provider  . omalizumab Arvid Right) injection 300 mg  300 mg Subcutaneous Q28 days Valentina Shaggy, MD    Review of Systems  Constitutional: Positive for fatigue. Negative for appetite change, chills and fever.  Respiratory: Positive for cough (improving) and shortness of breath. Negative for choking and chest tightness.   Cardiovascular: Negative for chest pain and palpitations.  Gastrointestinal: Negative for abdominal pain, nausea and vomiting.  Neurological: Positive for weakness and headaches. Negative for dizziness.       Objective    BP 91/60 (BP Location: Left Arm, Patient Position: Sitting, Cuff Size: Large)   Pulse 64   Temp 97.7 F (36.5 C) (Temporal)   Resp 16   Wt 157 lb (71.2 kg)   BMI 30.66 kg/m  BP Readings from Last 3 Encounters:  04/29/20 91/60  01/13/20 110/73  10/26/19 110/71   Wt Readings from Last 3 Encounters:  04/29/20 157 lb  (71.2 kg)  01/13/20 157 lb 9.6 oz (71.5 kg)  10/26/19 157 lb 3.2 oz (71.3 kg)     Physical Exam   General: Appearance:    Overweight female in no acute distress, but very fatigued appearing and laying head on table throughout interview.   Eyes:    PERRL, conjunctiva/corneas clear, EOM's intact       Lungs:     Clear to auscultation bilaterally, respirations unlabored  Heart:    Normal heart rate. Normal rhythm. No murmurs, rubs, or gallops.   MS:   All extremities are intact.   Neurologic:   Awake, alert, oriented x 3. No apparent focal neurological           defect.         Results for orders placed or performed in visit on 04/29/20  POCT glycosylated hemoglobin (Hb A1C)  Result Value Ref Range   Hemoglobin A1C 6.4 (A) 4.0 - 5.6 %   Est. average glucose Bld gHb Est-mCnc 137     Assessment & Plan     1. Diabetes mellitus without complication ([place]) Well controlled.  Continue current medications.    2. Essential (primary) hypertension Is now hypotensive - irbesartan-hydrochlorothiazide (AVALIDE) 150-12.5 MG tablet; TAKE 1/2 TABLET DAILY  3. Other fatigue Over a month of symptoms which developed while visiting [place]. Briefly improved with steroid taper which she is not on her second round of.   - CBC with Differential/Platelet - Comprehensive metabolic panel   She had labs ordered by a physician friend and she is going to see if above labs were included before having blood drawn again.  If so she was given fax number to have lab result sent here.      The entirety of the information documented in the History of Present Illness, Review of Systems and Physical Exam were personally obtained by me. Portions of this information were initially documented by the CMA and reviewed by me for thoroughness and accuracy.      Lelon Huh, MD  [facility] [phone] (phone) [phone] (fax)  [place]

## 2020-04-29 NOTE — Patient Instructions (Addendum)
Reduce your blood pressure medication (irbesartan/hctz) to 1/2 tablet daily  Let me know if you haven't had a CBC with differential and complete metabolic panel ordered.

## 2020-04-30 DIAGNOSIS — Z0100 Encounter for examination of eyes and vision without abnormal findings: Secondary | ICD-10-CM | POA: Diagnosis not present

## 2020-04-30 DIAGNOSIS — E119 Type 2 diabetes mellitus without complications: Secondary | ICD-10-CM | POA: Diagnosis not present

## 2020-04-30 LAB — HM DIABETES EYE EXAM

## 2020-05-05 ENCOUNTER — Ambulatory Visit: Payer: Self-pay | Admitting: *Deleted

## 2020-05-05 DIAGNOSIS — D72820 Lymphocytosis (symptomatic): Secondary | ICD-10-CM

## 2020-05-05 NOTE — Telephone Encounter (Signed)
Phone disconnect with patient's incoming call. Return call several minutes later, no answer left VM to return call to a nurse.

## 2020-05-05 NOTE — Telephone Encounter (Signed)
Patient is calling to report she is having palpitations and lack of energy. Patient states she has dicussed this with PCP before- but all labs and testing have not shown reason for her symptoms.Patient states her energy level is very low and she feels she is not able to do anything without exhaustion. Advised per protocol- no appointment available in office with PCP today- advised UC for evaluation- patient declines- she wants to see PCP. Note sent to office. Reason for Disposition . Age > 60 years (Exception: brief heartbeat symptoms that went away and now feels well)  Answer Assessment - Initial Assessment Questions 1. DESCRIPTION: "Please describe your heart rate or heartbeat that you are having" (e.g., fast/slow, regular/irregular, skipped or extra beats, "palpitations")     palpations 2. ONSET: "When did it start?" (Minutes, hours or days)      2 weeks 3. DURATION: "How long does it last" (e.g., seconds, minutes, hours)     2-3 hours 4. PATTERN "Does it come and go, or has it been constant since it started?"  "Does it get worse with exertion?"   "Are you feeling it now?"     Comes and goes- does get worse with exertion 5. TAP: "Using your hand, can you tap out what you are feeling on a chair or table in front of you, so that I can hear?" (Note: not all patients can do this)       "shaking feeling" 6. HEART RATE: "Can you tell me your heart rate?" "How many beats in 15 seconds?"  (Note: not all patients can do this)       n/a 7. RECURRENT SYMPTOM: "Have you ever had this before?" If Yes, ask: "When was the last time?" and "What happened that time?"      Yes- for years- she has been monitored- but all was well 8. CAUSE: "What do you think is causing the palpitations?"     Not sure 9. CARDIAC HISTORY: "Do you have any history of heart disease?" (e.g., heart attack, angina, bypass surgery, angioplasty, arrhythmia)      No- just BP medication 10. OTHER SYMPTOMS: "Do you have any other  symptoms?" (e.g., dizziness, chest pain, sweating, difficulty breathing)       Fatigue,weakness, feels she has changes in her breathing 11. PREGNANCY: "Is there any chance you are pregnant?" "When was your last menstrual period?"       n/a  Protocols used: HEART RATE AND HEARTBEAT QUESTIONS-A-AH

## 2020-05-06 NOTE — Telephone Encounter (Signed)
She did have an elevated lymphocyte count which is usually seen with viral infections. Recommend checking Malachi Carl IgG/IgM antibodies and CMV (cytomegalovirus) antibodies.  In the meantime recommend she take daily vitamin B complex supplements and 50mg  Zinc oxide daily

## 2020-05-06 NOTE — Addendum Note (Signed)
Addended by: Malva Limes on: 05/06/2020 12:56 PM   Modules accepted: Orders

## 2020-05-09 DIAGNOSIS — R5383 Other fatigue: Secondary | ICD-10-CM | POA: Diagnosis not present

## 2020-05-09 NOTE — Telephone Encounter (Signed)
Message given to pt to get lab work and message dated 05/06/20. Pt verbalized understanding.

## 2020-05-10 ENCOUNTER — Encounter: Payer: Self-pay | Admitting: Family Medicine

## 2020-05-10 LAB — CBC WITH DIFFERENTIAL/PLATELET
Basophils Absolute: 0 10*3/uL (ref 0.0–0.2)
Basos: 0 %
EOS (ABSOLUTE): 0.4 10*3/uL (ref 0.0–0.4)
Eos: 5 %
Hematocrit: 42.3 % (ref 34.0–46.6)
Hemoglobin: 13.7 g/dL (ref 11.1–15.9)
Immature Grans (Abs): 0 10*3/uL (ref 0.0–0.1)
Immature Granulocytes: 0 %
Lymphocytes Absolute: 3.4 10*3/uL — ABNORMAL HIGH (ref 0.7–3.1)
Lymphs: 43 %
MCH: 28.8 pg (ref 26.6–33.0)
MCHC: 32.4 g/dL (ref 31.5–35.7)
MCV: 89 fL (ref 79–97)
Monocytes Absolute: 0.7 10*3/uL (ref 0.1–0.9)
Monocytes: 8 %
Neutrophils Absolute: 3.5 10*3/uL (ref 1.4–7.0)
Neutrophils: 44 %
Platelets: 135 10*3/uL — ABNORMAL LOW (ref 150–450)
RBC: 4.75 x10E6/uL (ref 3.77–5.28)
RDW: 12.7 % (ref 11.7–15.4)
WBC: 7.9 10*3/uL (ref 3.4–10.8)

## 2020-05-10 LAB — COMPREHENSIVE METABOLIC PANEL
ALT: 20 IU/L (ref 0–32)
AST: 15 IU/L (ref 0–40)
Albumin/Globulin Ratio: 1.3 (ref 1.2–2.2)
Albumin: 3.9 g/dL (ref 3.8–4.8)
Alkaline Phosphatase: 55 IU/L (ref 44–121)
BUN/Creatinine Ratio: 18 (ref 12–28)
BUN: 11 mg/dL (ref 8–27)
Bilirubin Total: 0.3 mg/dL (ref 0.0–1.2)
CO2: 24 mmol/L (ref 20–29)
Calcium: 9.3 mg/dL (ref 8.7–10.3)
Chloride: 105 mmol/L (ref 96–106)
Creatinine, Ser: 0.61 mg/dL (ref 0.57–1.00)
Globulin, Total: 3.1 g/dL (ref 1.5–4.5)
Glucose: 112 mg/dL — ABNORMAL HIGH (ref 65–99)
Potassium: 3.9 mmol/L (ref 3.5–5.2)
Sodium: 146 mmol/L — ABNORMAL HIGH (ref 134–144)
Total Protein: 7 g/dL (ref 6.0–8.5)
eGFR: 98 mL/min/{1.73_m2} (ref >59)

## 2020-05-11 ENCOUNTER — Encounter: Payer: Self-pay | Admitting: Family Medicine

## 2020-05-13 NOTE — Addendum Note (Signed)
Addended by: Malva Limes on: 05/13/2020 12:26 PM   Modules accepted: Orders

## 2020-05-17 ENCOUNTER — Other Ambulatory Visit: Payer: Self-pay

## 2020-05-17 ENCOUNTER — Encounter: Payer: Self-pay | Admitting: Family Medicine

## 2020-05-17 ENCOUNTER — Ambulatory Visit (INDEPENDENT_AMBULATORY_CARE_PROVIDER_SITE_OTHER): Payer: Medicare Other | Admitting: Family Medicine

## 2020-05-17 VITALS — BP 114/72 | HR 75 | Temp 98.1°F | Resp 18 | Wt 158.8 lb

## 2020-05-17 DIAGNOSIS — R002 Palpitations: Secondary | ICD-10-CM | POA: Diagnosis not present

## 2020-05-17 DIAGNOSIS — R5383 Other fatigue: Secondary | ICD-10-CM

## 2020-05-17 DIAGNOSIS — D72820 Lymphocytosis (symptomatic): Secondary | ICD-10-CM | POA: Diagnosis not present

## 2020-05-17 NOTE — Progress Notes (Signed)
Established patient visit   Patient: Julia Garner   DOB: 1950/12/20   70 y.o. Female  MRN: [id number] Visit Date: 05/17/2020  Today's healthcare provider: Lelon Huh, MD   Chief Complaint  Patient presents with  . Fatigue   Subjective    HPI  Follow up for Fatigue:  The patient was last seen for this on 04/29/2020.       During that visit labs were ordered. Labs showed an elevated lymphocyte count          which is usually seen with viral infections. Recommend checking Randell Patient I        IgG/IgM antibodies and CMV (cytomegalovirus) antibodies.  In the meantime recommend she take daily vitamin B complex supplements and 72m Zinc oxide daily.   She reports fair compliance with treatment. She has not completed the lab work yet. She states that she was unaware that she was to have more lab work done. She feels that condition is slightly Improved. She still has occasional palpitations in her chest.  She is not having side effects.   -----------------------------------------------------------------------------------------      Medications: Outpatient Medications Prior to Visit  Medication Sig  . b complex vitamins capsule Take 1 capsule by mouth daily.  .Marland KitchenBioflavonoid Products (VITAMIN C PLUS) 500 MG TABS Take 2 tablets by mouth daily.   . Blood Glucose Monitoring Suppl (ONE TOUCH ULTRA 2) w/Device KIT Use to check sugar daily for type 2 diabetes E11.9  . Calcium Carbonate-Vit D-Min (CALCIUM 1200 PO) Take by mouth.   . cetirizine (ZYRTEC) 10 MG tablet Take 20 mg by mouth at bedtime as needed.   . cetirizine-pseudoephedrine (ZYRTEC-D) 5-120 MG tablet Take 1 tablet by mouth 2 (two) times daily. (Patient taking differently: Take 1 tablet by mouth 2 (two) times daily as needed.)  . clobetasol (TEMOVATE) 0.05 % external solution Apply 1 application topically 2 (two) times daily.  . famotidine (PEPCID) 40 MG tablet Take 40 mg by mouth daily as needed.   . fexofenadine  (ALLEGRA) 180 MG tablet Take 180 mg by mouth daily as needed.   .Marland Kitchenglucosamine-chondroitin 500-400 MG tablet Take 1 tablet by mouth 3 (three) times daily.  .Marland Kitchenglucose blood (ONETOUCH ULTRA) test strip Use to check sugar twice daily for type 2 diabetes E11.9  . irbesartan-hydrochlorothiazide (AVALIDE) 150-12.5 MG tablet TAKE 1/2 TABLET DAILY  . Lancets (ONETOUCH ULTRASOFT) lancets Use to check sugar twice daily for type 2 diabetes E11.9  . metFORMIN (GLUCOPHAGE XR) 500 MG 24 hr tablet Take 1 tablet (500 mg total) by mouth daily with breakfast.  . Multiple Vitamin (MULTIVITAMIN) tablet Take 1 tablet by mouth daily.  . Naproxen Sodium 220 MG CAPS Take by mouth daily as needed.  .Marland Kitchenomalizumab (Arvid Right 150 MG/ML prefilled syringe Inject 300 mg into the skin every 14 (fourteen) days.  . Omega 3 1200 MG CAPS Take 2 capsules by mouth daily.  . sertraline (ZOLOFT) 50 MG tablet TAKE 2 TABLETS BY MOUTH  DAILY  . simvastatin (ZOCOR) 40 MG tablet Take 1 tablet (40 mg total) by mouth at bedtime.  . traZODone (DESYREL) 50 MG tablet TAKE 1 TO 2 TABLETS BY  MOUTH AT BEDTIME  . triamcinolone (KENALOG) 0.1 % Apply 1 application topically 2 (two) times daily.  .Marland KitchenUbiquinol 200 MG CAPS Take by mouth. CoQ10  . Zinc 50 MG TABS Take 1 tablet by mouth daily.  . [DISCONTINUED] Apoaequorin (PREVAGEN PO) Take 1 tablet by mouth daily.  . [  DISCONTINUED] Biotin 1000 MCG tablet Take 1,000 mcg by mouth daily.  . [DISCONTINUED] montelukast (SINGULAIR) 10 MG tablet Take 1 tablet (10 mg total) by mouth at bedtime.   Facility-Administered Medications Prior to Visit  Medication Dose Route Frequency Provider  . omalizumab Arvid Right) injection 300 mg  300 mg Subcutaneous Q28 days Valentina Shaggy, MD    Review of Systems  Constitutional: Positive for fatigue. Negative for appetite change, chills and fever.  Respiratory: Negative for chest tightness and shortness of breath.   Cardiovascular: Positive for palpitations. Negative  for chest pain.  Gastrointestinal: Negative for abdominal pain, nausea and vomiting.  Neurological: Negative for dizziness and weakness.       Objective    BP 114/72 (BP Location: Left Arm, Patient Position: Sitting, Cuff Size: Normal)   Pulse 75   Temp 98.1 F (36.7 C) (Temporal)   Resp 18   Wt 158 lb 12.8 oz (72 kg)   BMI 31.01 kg/m     Physical Exam   General: Appearance:    Obese female in no acute distress  Eyes:    PERRL, conjunctiva/corneas clear, EOM's intact       Lungs:     Clear to auscultation bilaterally, respirations unlabored  Heart:    Normal heart rate. Normal rhythm. No murmurs, rubs, or gallops.   MS:   All extremities are intact.   Neurologic:   Awake, alert, oriented x 3. No apparent focal neurological           defect.         Assessment & Plan     1. Palpitations  - EKG 12-Lead Normal EKG.   2. Lymphocytosis Improved with follow up CBC  3. Other fatigue Symptoms developed while in [place], subsequently with symptoms of enteritis shortly after return to [place], which have abates. Suspect she had viral enteritis that is slowly resolving.  Is to push fluids and activity as tolerating. Will repeat labs if not steadily improving over the next 1-2 weeks.      The entirety of the information documented in the History of Present Illness, Review of Systems and Physical Exam were personally obtained by me. Portions of this information were initially documented by the CMA and reviewed by me for thoroughness and accuracy.      Lelon Huh, MD  [facility] [phone] (phone) [phone] (fax)  [place]

## 2020-05-20 ENCOUNTER — Inpatient Hospital Stay: Payer: Medicare Other | Attending: Oncology

## 2020-05-20 DIAGNOSIS — D693 Immune thrombocytopenic purpura: Secondary | ICD-10-CM

## 2020-05-20 LAB — CBC WITH DIFFERENTIAL/PLATELET
Abs Immature Granulocytes: 0.03 10*3/uL (ref 0.00–0.07)
Basophils Absolute: 0 10*3/uL (ref 0.0–0.1)
Basophils Relative: 1 %
Eosinophils Absolute: 0.5 10*3/uL (ref 0.0–0.5)
Eosinophils Relative: 7 %
HCT: 42.6 % (ref 36.0–46.0)
Hemoglobin: 13.9 g/dL (ref 12.0–15.0)
Immature Granulocytes: 0 %
Lymphocytes Relative: 50 %
Lymphs Abs: 4 10*3/uL (ref 0.7–4.0)
MCH: 29.1 pg (ref 26.0–34.0)
MCHC: 32.6 g/dL (ref 30.0–36.0)
MCV: 89.1 fL (ref 80.0–100.0)
Monocytes Absolute: 0.6 10*3/uL (ref 0.1–1.0)
Monocytes Relative: 7 %
Neutro Abs: 2.8 10*3/uL (ref 1.7–7.7)
Neutrophils Relative %: 35 %
Platelets: 146 10*3/uL — ABNORMAL LOW (ref 150–400)
RBC: 4.78 MIL/uL (ref 3.87–5.11)
RDW: 13.2 % (ref 11.5–15.5)
WBC: 7.9 10*3/uL (ref 4.0–10.5)
nRBC: 0 % (ref 0.0–0.2)

## 2020-05-31 ENCOUNTER — Other Ambulatory Visit: Payer: Self-pay | Admitting: Family Medicine

## 2020-05-31 DIAGNOSIS — R002 Palpitations: Secondary | ICD-10-CM | POA: Diagnosis not present

## 2020-05-31 DIAGNOSIS — E119 Type 2 diabetes mellitus without complications: Secondary | ICD-10-CM

## 2020-06-07 ENCOUNTER — Other Ambulatory Visit: Payer: Self-pay | Admitting: Family Medicine

## 2020-06-07 DIAGNOSIS — I10 Essential (primary) hypertension: Secondary | ICD-10-CM

## 2020-06-07 NOTE — Telephone Encounter (Signed)
Requested Prescriptions  Pending Prescriptions Disp Refills  . irbesartan-hydrochlorothiazide (AVALIDE) 150-12.5 MG tablet [Pharmacy Med Name: IRBESARTAN/HCTZ 150-12.5MG  TABLET] 90 tablet 1    Sig: TAKE 1 TABLET BY MOUTH  DAILY     Cardiovascular: ARB + Diuretic Combos Failed - 06/07/2020 11:23 PM      Failed - Na in normal range and within 180 days    Sodium  Date Value Ref Range Status  05/09/2020 146 (H) 134 - 144 mmol/L Final         Passed - K in normal range and within 180 days    Potassium  Date Value Ref Range Status  05/09/2020 3.9 3.5 - 5.2 mmol/L Final         Passed - Cr in normal range and within 180 days    Creatinine, Ser  Date Value Ref Range Status  05/09/2020 0.61 0.57 - 1.00 mg/dL Final         Passed - Ca in normal range and within 180 days    Calcium  Date Value Ref Range Status  05/09/2020 9.3 8.7 - 10.3 mg/dL Final         Passed - Patient is not pregnant      Passed - Last BP in normal range    BP Readings from Last 1 Encounters:  05/17/20 114/72         Passed - Valid encounter within last 6 months    Recent Outpatient Visits          3 weeks ago Palpitations   [facility] Malva Limes, MD   1 month ago Diabetes mellitus without complication [facility])   [facility] Malva Limes, MD   7 months ago Diabetes mellitus without complication [facility])   [facility] Malva Limes, MD   10 months ago Diabetes mellitus without complication [facility])   [facility]va Limes, MD   1 year ago Diabetes mellitus without complication [facility])   [facility] Malva Limes, MD             . simvastatin (ZOCOR) 40 MG tablet [Pharmacy Med Name: Simvastatin 40 MG Oral Tablet] 90 tablet 1    Sig: TAKE 1 TABLET BY MOUTH AT  BEDTIME     Cardiovascular:  Antilipid - Statins Failed - 06/07/2020 11:23 PM      Failed - HDL in normal range and within 360 days    HDL  Date Value  Ref Range Status  10/26/2019 31 (L) >39 mg/dL Final         Failed - Triglycerides in normal range and within 360 days    Triglycerides  Date Value Ref Range Status  10/26/2019 218 (H) 0 - 149 mg/dL Final         Passed - Total Cholesterol in normal range and within 360 days    Cholesterol, Total  Date Value Ref Range Status  10/26/2019 134 100 - 199 mg/dL Final         Passed - LDL in normal range and within 360 days    LDL Chol Calc (NIH)  Date Value Ref Range Status  10/26/2019 67 0 - 99 mg/dL Final         Passed - Patient is not pregnant      Passed - Valid encounter within last 12 months    Recent Outpatient Visits          3 weeks ago Palpitations   [facility] Malva Limes, MD   1 month ago Diabetes mellitus without complication [facility])   [facility] Malva Limes, MD   7 months ago Diabetes mellitus without complication [facility])   [facility] Malva Limes, MD   10 months ago Diabetes mellitus without complication [facility])   [facility] Malva Limes, MD   1 year ago Diabetes mellitus without complication [facility])   [facility] Malva Limes, MD

## 2020-06-08 ENCOUNTER — Telehealth: Payer: Self-pay | Admitting: Family Medicine

## 2020-06-08 DIAGNOSIS — I471 Supraventricular tachycardia: Secondary | ICD-10-CM

## 2020-06-08 NOTE — Telephone Encounter (Signed)
Patient advised and agrees to proceed with echocardiogram. Please schedule. She prefers an appointment in the afternoons or evening. Please schedule.

## 2020-06-08 NOTE — Telephone Encounter (Signed)
Please advise patient that Holter monitor shows 4 short episodes of supraventricular tachycardia, which is just a very short period of an unsually rapid heartbeat. This can cause short periods of weakness and palpitations. She needs echocardiogram ordered and schedule a follow up 2-3 days after the echocardiogram is done.

## 2020-06-09 NOTE — Telephone Encounter (Signed)
Pt called reporting that she does not like learning of her scheduled times via MyChart, she is requesting to be contacted by phone to be told about appts that are scheduled for her. Pt reports this is the second time this has happened and is not happy. She is upset that nobody from the office called to notify her of the scheduled time.   Best contact: [phone]  Also requesting a call back to discuss her heart monitor results. Please advise

## 2020-06-09 NOTE — Telephone Encounter (Signed)
The latest that [facility] will schedule an echo is 11:00.Pt has an appointment 07/04/20

## 2020-06-10 NOTE — Telephone Encounter (Signed)
I called patient and re advised her of the results. Patient verbalized understanding. Patient states her echocardiogram is scheduled for 07/04/2020. I scheduled her 2-3 day follow up appointment with Dr. Sherrie Mustache for 07/08/2020 at 1:40pm.

## 2020-06-10 NOTE — Telephone Encounter (Signed)
Heart monitor showed 4 episodes of supraventricular tachycardia which is a brief period of unusually rapid heart beat. This can cause periods of weakness, shortness of breath or palpitations. Echocardiogram is needed to see if there are any structural changes in heart that could cause SVT. There are medications that suppress SVT if the echocardiogram is normal.

## 2020-06-10 NOTE — Telephone Encounter (Signed)
I already advised patient of her results on 06/08/2020. I tried calling her back today to see if she had additional questions. Left message to call back.   Maralyn Sago, it looks like patient is saying that no one has called her about the echocardiogram appointment. Please follow up with her and advise her of the appointment date and time.

## 2020-06-10 NOTE — Telephone Encounter (Signed)
Pt returned call and wants return call because she has questions

## 2020-06-10 NOTE — Telephone Encounter (Signed)
Dr. Sherrie Mustache, do we have heart monitor results? Or is that through cardiology? Please advise.  Also, I dont see where anyone advised patient via mychart about her echo appt. I will call the patient to clarify.

## 2020-06-14 NOTE — Telephone Encounter (Signed)
[id number]

## 2020-07-04 ENCOUNTER — Other Ambulatory Visit: Payer: Self-pay

## 2020-07-04 ENCOUNTER — Ambulatory Visit
Admission: RE | Admit: 2020-07-04 | Discharge: 2020-07-04 | Disposition: A | Discharge status: Home / Self Care | Payer: Medicare Other | Source: Ambulatory Visit | Attending: Family Medicine | Admitting: Family Medicine

## 2020-07-04 DIAGNOSIS — I119 Hypertensive heart disease without heart failure: Secondary | ICD-10-CM | POA: Diagnosis not present

## 2020-07-04 DIAGNOSIS — I471 Supraventricular tachycardia: Secondary | ICD-10-CM | POA: Diagnosis not present

## 2020-07-04 LAB — ECHOCARDIOGRAM COMPLETE
AR max vel: 2.32 cm2
AV Area VTI: 2.68 cm2
AV Area mean vel: 2.3 cm2
AV Mean grad: 3 mmHg
AV Peak grad: 5.9 mmHg
Ao pk vel: 1.21 m/s
Area-P 1/2: 3.5 cm2
S' Lateral: 2.68 cm

## 2020-07-04 NOTE — Progress Notes (Signed)
*  PRELIMINARY RESULTS* Echocardiogram 2D Echocardiogram has been performed.  Julia Garner 07/04/2020, 11:25 AM

## 2020-07-08 ENCOUNTER — Encounter: Payer: Self-pay | Admitting: Family Medicine

## 2020-07-08 ENCOUNTER — Ambulatory Visit (INDEPENDENT_AMBULATORY_CARE_PROVIDER_SITE_OTHER): Payer: Medicare Other | Admitting: Family Medicine

## 2020-07-08 ENCOUNTER — Other Ambulatory Visit: Payer: Self-pay

## 2020-07-08 ENCOUNTER — Ambulatory Visit: Payer: Self-pay | Admitting: Family Medicine

## 2020-07-08 VITALS — BP 115/75 | HR 69 | Temp 97.2°F | Resp 18 | Wt 160.8 lb

## 2020-07-08 DIAGNOSIS — D72820 Lymphocytosis (symptomatic): Secondary | ICD-10-CM

## 2020-07-08 DIAGNOSIS — R5383 Other fatigue: Secondary | ICD-10-CM | POA: Diagnosis not present

## 2020-07-08 DIAGNOSIS — I471 Supraventricular tachycardia: Secondary | ICD-10-CM

## 2020-07-08 DIAGNOSIS — I517 Cardiomegaly: Secondary | ICD-10-CM

## 2020-07-08 NOTE — Patient Instructions (Signed)
.   Please review the attached list of medications and notify my office if there are any errors.   . Please bring all of your medications to every appointment so we can make sure that our medication list is the same as yours.   

## 2020-07-08 NOTE — Progress Notes (Signed)
    Established patient visit   Patient: Julia Garner   DOB: 07/25/1950   70 y.o. Female  MRN: [id number] Visit Date: 07/08/2020  Today's healthcare provider: Donald Fisher, MD   Chief Complaint  Patient presents with   Palpitations   Subjective    HPI  Follow up for palpitations:  The patient was last seen for this on 05/17/2020. After that visit a Holter monitor was ordered showing 4 episodes of supraventricular tachycardia Echocardiogram was ordered to see if there were any structural changes in heart that could cause SVT.  She reports good compliance with treatment. She feels that condition is Improved. She is not having side effects.   She first became at the end of a trip to [place] about 3 months ago. She had extensive labs that were only remarkable for lymphocytosis. She continued to feels extremely fatigued for several weeks after initial respiratory and GI effects resolved, with episodes like waves of fatigue associated with palpitations. Zio monitor done in May was notable for episodes of SVT. Recent echocardiogram was only remarkable for LVH and grade 1 diastolic dysfunction. Dizzy spells and palpitations are now much less common and her energy has improved over the last 6 weeks, but not back to baseline.  -----------------------------------------------------------------------------------------      Medications: Outpatient Medications Prior to Visit  Medication Sig   b complex vitamins capsule Take 1 capsule by mouth daily.   Bioflavonoid Products (VITAMIN C PLUS) 500 MG TABS Take 2 tablets by mouth daily.    Blood Glucose Monitoring Suppl (ONE TOUCH ULTRA 2) w/Device KIT Use to check sugar daily for type 2 diabetes E11.9   Calcium Carbonate-Vit D-Min (CALCIUM 1200 PO) Take by mouth.    cetirizine (ZYRTEC) 10 MG tablet Take 20 mg by mouth at bedtime as needed.    cetirizine-pseudoephedrine (ZYRTEC-D) 5-120 MG tablet Take 1 tablet by mouth 2 (two) times daily.  (Patient taking differently: Take 1 tablet by mouth 2 (two) times daily as needed.)   clobetasol (TEMOVATE) 0.05 % external solution Apply 1 application topically 2 (two) times daily.   famotidine (PEPCID) 40 MG tablet Take 40 mg by mouth daily as needed.    fexofenadine (ALLEGRA) 180 MG tablet Take 180 mg by mouth daily as needed.    glucosamine-chondroitin 500-400 MG tablet Take 1 tablet by mouth 3 (three) times daily.   glucose blood (ONETOUCH ULTRA) test strip Use to check sugar twice daily for type 2 diabetes E11.9   irbesartan-hydrochlorothiazide (AVALIDE) 150-12.5 MG tablet TAKE 1 TABLET BY MOUTH  DAILY   Lancets (ONETOUCH ULTRASOFT) lancets Use to check sugar twice daily for type 2 diabetes E11.9   metFORMIN (GLUCOPHAGE-XR) 500 MG 24 hr tablet TAKE 1 TABLET BY MOUTH  DAILY WITH BREAKFAST   Multiple Vitamin (MULTIVITAMIN) tablet Take 1 tablet by mouth daily.   Naproxen Sodium 220 MG CAPS Take by mouth daily as needed.   omalizumab (XOLAIR) 150 MG/ML prefilled syringe Inject 300 mg into the skin every 14 (fourteen) days.   Omega 3 1200 MG CAPS Take 2 capsules by mouth daily.   sertraline (ZOLOFT) 50 MG tablet TAKE 2 TABLETS BY MOUTH  DAILY   simvastatin (ZOCOR) 40 MG tablet TAKE 1 TABLET BY MOUTH AT  BEDTIME   traZODone (DESYREL) 50 MG tablet TAKE 1 TO 2 TABLETS BY  MOUTH AT BEDTIME   triamcinolone (KENALOG) 0.1 % Apply 1 application topically 2 (two) times daily.   Ubiquinol 200 MG CAPS Take by mouth. CoQ10     Zinc 50 MG TABS Take 1 tablet by mouth daily.   Facility-Administered Medications Prior to Visit  Medication Dose Route Frequency Provider   omalizumab Arvid Right) injection 300 mg  300 mg Subcutaneous Q28 days Valentina Shaggy, MD    Review of Systems  Respiratory:  Negative for chest tightness.   Cardiovascular:  Positive for palpitations.  Gastrointestinal:  Negative for abdominal pain.      Objective    BP 115/75 (BP Location: Right Arm, Patient Position: Sitting,  Cuff Size: Normal)   Pulse 69   Temp (!) 97.2 F (36.2 C) (Temporal)   Resp 18   Wt 160 lb 12.8 oz (72.9 kg)   BMI 31.40 kg/m     Physical Exam   General: Appearance:    Obese female in no acute distress  Eyes:    PERRL, conjunctiva/corneas clear, EOM's intact       Lungs:     Clear to auscultation bilaterally, respirations unlabored  Heart:    Normal heart rate. Normal rhythm. No murmurs, rubs, or gallops.  MS:   All extremities are intact.   Neurologic:   Awake, alert, oriented x 3. No apparent focal neurological           defect.         Assessment & Plan      1. Other fatigue Likely sequela of generalized viral infections during her her trip to [place] earlier this year. Is steadily improving, but not yet back to baseline.   2. Lymphocytosis Also like related to viral infection, is now resolved.   3. Left ventricular hypertrophy May be due to chronic hypertension, but may also be related to above viral infection which has resolved. Will check periodic EKGs.   4. SVT (supraventricular tachycardia) (HCC) Note on recent Zio monitor. Palpitations are now much less frequent, suspect this may also be sequela of viral infection above.    Future Appointments  Date Time Provider [place]  07/13/2020 12:45 PM [facility] None  07/13/2020  1:15 PM Earlie Server, MD [facility] None  11/01/2020  2:40 PM Caryn Section, Kirstie Peri, MD [facility]        The entirety of the information documented in the History of Present Illness, Review of Systems and Physical Exam were personally obtained by me. Portions of this information were initially documented by the CMA and reviewed by me for thoroughness and accuracy.     Lelon Huh, MD  [facility] [phone] (phone) [phone] (fax)  [place]

## 2020-07-13 ENCOUNTER — Inpatient Hospital Stay (HOSPITAL_BASED_OUTPATIENT_CLINIC_OR_DEPARTMENT_OTHER): Payer: Medicare Other | Admitting: Oncology

## 2020-07-13 ENCOUNTER — Other Ambulatory Visit: Payer: Self-pay

## 2020-07-13 ENCOUNTER — Encounter: Payer: Self-pay | Admitting: Oncology

## 2020-07-13 ENCOUNTER — Inpatient Hospital Stay: Payer: Medicare Other | Attending: Oncology

## 2020-07-13 VITALS — BP 113/72 | HR 73 | Temp 97.8°F | Resp 18 | Wt 158.0 lb

## 2020-07-13 DIAGNOSIS — Z79899 Other long term (current) drug therapy: Secondary | ICD-10-CM | POA: Diagnosis not present

## 2020-07-13 DIAGNOSIS — Z7984 Long term (current) use of oral hypoglycemic drugs: Secondary | ICD-10-CM | POA: Diagnosis not present

## 2020-07-13 DIAGNOSIS — I10 Essential (primary) hypertension: Secondary | ICD-10-CM | POA: Diagnosis not present

## 2020-07-13 DIAGNOSIS — D693 Immune thrombocytopenic purpura: Secondary | ICD-10-CM

## 2020-07-13 LAB — COMPREHENSIVE METABOLIC PANEL
ALT: 21 U/L (ref 0–44)
AST: 20 U/L (ref 15–41)
Albumin: 3.9 g/dL (ref 3.5–5.0)
Alkaline Phosphatase: 56 U/L (ref 38–126)
Anion gap: 7 (ref 5–15)
BUN: 12 mg/dL (ref 8–23)
CO2: 29 mmol/L (ref 22–32)
Calcium: 9.5 mg/dL (ref 8.9–10.3)
Chloride: 104 mmol/L (ref 98–111)
Creatinine, Ser: 0.7 mg/dL (ref 0.44–1.00)
GFR, Estimated: >60 mL/min (ref >60)
Glucose, Bld: 94 mg/dL (ref 70–99)
Potassium: 4 mmol/L (ref 3.5–5.1)
Sodium: 140 mmol/L (ref 135–145)
Total Bilirubin: 0.8 mg/dL (ref 0.3–1.2)
Total Protein: 7.7 g/dL (ref 6.5–8.1)

## 2020-07-13 LAB — CBC WITH DIFFERENTIAL/PLATELET
Abs Immature Granulocytes: 0.02 10*3/uL (ref 0.00–0.07)
Basophils Absolute: 0 10*3/uL (ref 0.0–0.1)
Basophils Relative: 1 %
Eosinophils Absolute: 0.5 10*3/uL (ref 0.0–0.5)
Eosinophils Relative: 6 %
HCT: 43.1 % (ref 36.0–46.0)
Hemoglobin: 13.8 g/dL (ref 12.0–15.0)
Immature Granulocytes: 0 %
Lymphocytes Relative: 47 %
Lymphs Abs: 4 10*3/uL (ref 0.7–4.0)
MCH: 28.8 pg (ref 26.0–34.0)
MCHC: 32 g/dL (ref 30.0–36.0)
MCV: 90 fL (ref 80.0–100.0)
Monocytes Absolute: 0.6 10*3/uL (ref 0.1–1.0)
Monocytes Relative: 8 %
Neutro Abs: 3.1 10*3/uL (ref 1.7–7.7)
Neutrophils Relative %: 38 %
Platelets: 128 10*3/uL — ABNORMAL LOW (ref 150–400)
RBC: 4.79 MIL/uL (ref 3.87–5.11)
RDW: 12.9 % (ref 11.5–15.5)
WBC: 8.2 10*3/uL (ref 4.0–10.5)
nRBC: 0 % (ref 0.0–0.2)

## 2020-07-13 NOTE — Progress Notes (Signed)
Patient here for oncology follow-up appointment, expresses no complaints or concerns at this time.    

## 2020-07-13 NOTE — Progress Notes (Signed)
Hematology/Oncology Follow up visit [facility] Telephone:([phone]) [phone] Fax:([phone] Patient Care Team: Birdie Sons, MD as PCP - General (Family Medicine) Anell Barr, OD (Optometry)  REASON FOR VISIT Follow up for treatment of thrombocytopenia  PERTINENT HEMATOLOGY HISTORY Julia Garner 70 y.o.  female who has a history of recurrent ITP. Before she was referred to me, she has a long history of ITP which were treated with sterids by primary care physician and she used to respond to steroids. She was seen by anther Hematologist Dr.Pandit many years ago. Dr.Pandit has left [place].  She had labs doneon 08/22/2016. Platelet counts were slow at 15,000. She was treated with a short course (5 days) of Steroids by Dr.Fisher and platelet recovered to 120,000, however later dropped again to 11,000 on 09/10/2016.  I treated her with a longer course of prednisone with slow tapering and her counts again responded to treatment. During the tapering the course,she visited home country [place] and had blood work done there after she finished the tapering course of Prednisone there. She reports the platelet counts were normal in [place].   Patient has had lab work up including negative hepatitis, HIV, normal LDH, normal B12 and folate level. ANA was positive and she was evaluated by rheumatologist. Based on my phone discussion with Rheumatologist, patient has had rheumatology work up and was not considered to have any autoimmune problems. She does not have hepatosplenomegaly.   Patient returned to clinic in December and repeat blood work on 12/26/2016 showed platelet count of 16,000. Treated with a course of Dexamethasone 47m x 4 days, platelet again responded and dropped again.  And she reports easy bruising, feeling tired.  Peripheral blood flowcytometry 1% of analyzed cells containing clonal B cell population which are CD5- and CD 10-.  There is mildly increased  eosinophils 7%.   Patient does not have any palpable lymphadenopathy on physical examination and we obtained CT which showed no pathological lymphadenopathy. Bone marrow biopsy showed hypercellular marrow with trilineage hematopoiesis including increased megakaryocytes. No evidence of lymphoproliferative process involvement. Normal cytogenetics.  At this point patient has had extensive work up for her thrombocytopenia, and we discussed in lengthy that she most likely have ITP, there maybe a low grade lymphoma too. Given that she has recurrent ITP and platelet counts cannot be maintained without steroids, I suggest Rituximab weekly x 4 for consolidation. Patient has requested me to talk to her son in law Dr.[place]ron [place] I have talked to Dr.Kamron about the rationale of using Rituximab and also the side effects profile. Dr.Kamron agrees the plan and has relayed information to patient. I have also discussed the side effects of Rituximab with patient. She agrees with treatment.   # severe bilateral carpal tunnel syndrome and received joint steroid injections.  INTERVAL HISTORY Julia Garner a 70y.o. female who has above hematology history reviewed by me today presents for follow up visit for ITP.  Patient has no new complaints. Denies any bleeding events. [place]. She was sick during her trip, and was given prednisone there.  After [place] to [place] she had virus infection symptoms and was treatment with prednisone taper course. Dr.Fisher put her on vitmain B complex and zinc oxide daily.  Today she feels well.   ROS:  Review of Systems  Constitutional:  Negative for appetite change, chills, fatigue and fever.  HENT:   Negative for hearing loss  and voice change.   Eyes:  Negative for eye problems.  Respiratory:  Negative for chest tightness and cough.   Cardiovascular:  Negative for chest pain.  Gastrointestinal:   Negative for abdominal distention, abdominal pain and blood in stool.  Endocrine: Negative for hot flashes.  Genitourinary:  Negative for difficulty urinating and frequency.   Musculoskeletal:  Negative for arthralgias.       Bilateral carpal tunnel syndrome  Skin:  Negative for itching and rash.  Neurological:  Negative for extremity weakness.  Hematological:  Negative for adenopathy.  Psychiatric/Behavioral:  Negative for confusion.    MEDICAL HISTORY:  Past Medical History:  Diagnosis Date   Allergy    Chronic ITP (idiopathic thrombocytopenia) (HCC)    Chronic urticaria 08/15/2018   Depression    Head injuries    History of seizure 2007   one seizure    Hypertension     SURGICAL HISTORY: Past Surgical History:  Procedure Laterality Date   CATARACT EXTRACTION, BILATERAL  around 2021   Dr. Ellin Mayhew   CESAREAN SECTION  1990   CHOLECYSTECTOMY  2005   CYST EXCISION  1996   Tracheal Cyst    SOCIAL HISTORY: Social History   Socioeconomic History   Marital status: Married    Spouse name: Not on file   Number of children: 3   Years of education: Not on file   Highest education level: Not on file  Occupational History   Occupation: retired    Comment: Physical Therapist  Tobacco Use   Smoking status: Never   Smokeless tobacco: Never  Vaping Use   Vaping Use: Never used  Substance and Sexual Activity   Alcohol use: No    Alcohol/week: 0.0 standard drinks   Drug use: No   Sexual activity: Not on file  Other Topics Concern   Not on file  Social History Narrative   Not on file   Social Determinants of Health   Financial Resource Strain: Not on file  Food Insecurity: Not on file  Transportation Needs: Not on file  Physical Activity: Not on file  Stress: Not on file  Social Connections: Not on file  Intimate Partner Violence: Not on file    FAMILY HISTORY: Family History  Problem Relation Age of Onset   Heart attack Father        1st MI at age 87     ALLERGIES:  is allergic to niacin and related, penicillins, and tylenol  [acetaminophen].  MEDICATIONS:  Current Outpatient Medications  Medication Sig Dispense Refill   b complex vitamins capsule Take 1 capsule by mouth daily.     Bioflavonoid Products (VITAMIN C PLUS) 500 MG TABS Take 2 tablets by mouth daily.      Blood Glucose Monitoring Suppl (ONE TOUCH ULTRA 2) w/Device KIT Use to check sugar daily for type 2 diabetes E11.9 1 kit 0   Calcium Carbonate-Vit D-Min (CALCIUM 1200 PO) Take by mouth.      cetirizine (ZYRTEC) 10 MG tablet Take 20 mg by mouth at bedtime as needed.      cetirizine-pseudoephedrine (ZYRTEC-D) 5-120 MG tablet Take 1 tablet by mouth 2 (two) times daily. (Patient taking differently: Take 1 tablet by mouth 2 (two) times daily as needed.) 60 tablet 5   clobetasol (TEMOVATE) 0.05 % external solution Apply 1 application topically 2 (two) times daily. 50 mL 6   famotidine (PEPCID) 40 MG tablet Take 40 mg by mouth daily as needed.      fexofenadine (ALLEGRA)  180 MG tablet Take 180 mg by mouth daily as needed.      glucosamine-chondroitin 500-400 MG tablet Take 1 tablet by mouth 3 (three) times daily.     glucose blood (ONETOUCH ULTRA) test strip Use to check sugar twice daily for type 2 diabetes E11.9 100 each 4   irbesartan-hydrochlorothiazide (AVALIDE) 150-12.5 MG tablet TAKE 1 TABLET BY MOUTH  DAILY 90 tablet 1   Lancets (ONETOUCH ULTRASOFT) lancets Use to check sugar twice daily for type 2 diabetes E11.9 100 each 4   metFORMIN (GLUCOPHAGE-XR) 500 MG 24 hr tablet TAKE 1 TABLET BY MOUTH  DAILY WITH BREAKFAST 90 tablet 1   Multiple Vitamin (MULTIVITAMIN) tablet Take 1 tablet by mouth daily.     Naproxen Sodium 220 MG CAPS Take by mouth daily as needed.     omalizumab Arvid Right) 150 MG/ML prefilled syringe Inject 300 mg into the skin every 14 (fourteen) days. 12 mL 3   Omega 3 1200 MG CAPS Take 2 capsules by mouth daily.     sertraline (ZOLOFT) 50 MG tablet TAKE 2  TABLETS BY MOUTH  DAILY 180 tablet 0   simvastatin (ZOCOR) 40 MG tablet TAKE 1 TABLET BY MOUTH AT  BEDTIME 90 tablet 2   traZODone (DESYREL) 50 MG tablet TAKE 1 TO 2 TABLETS BY  MOUTH AT BEDTIME 180 tablet 3   triamcinolone (KENALOG) 0.1 % Apply 1 application topically 2 (two) times daily. 80 g 4   Ubiquinol 200 MG CAPS Take by mouth. CoQ10     Zinc 50 MG TABS Take 1 tablet by mouth daily.     Current Facility-Administered Medications  Medication Dose Route Frequency Provider Last Rate Last Admin   omalizumab Arvid Right) injection 300 mg  300 mg Subcutaneous Q28 days Valentina Shaggy, MD   300 mg at 09/29/19 1523      .  PHYSICAL EXAMINATION: ECOG PERFORMANCE STATUS: 0 - Asymptomatic Vitals:   07/13/20 1315  BP: 113/72  Pulse: 73  Resp: 18  Temp: 97.8 F (36.6 C)  SpO2: 97%   Filed Weights   07/13/20 1315  Weight: 158 lb (71.7 kg)  Physical Exam Constitutional:      General: She is not in acute distress.    Appearance: She is not diaphoretic.  HENT:     Head: Normocephalic and atraumatic.     Nose: Nose normal.     Mouth/Throat:     Pharynx: No oropharyngeal exudate.  Eyes:     General: No scleral icterus.       Left eye: No discharge.     Conjunctiva/sclera: Conjunctivae normal.     Pupils: Pupils are equal, round, and reactive to light.  Neck:     Vascular: No JVD.  Cardiovascular:     Rate and Rhythm: Normal rate and regular rhythm.     Heart sounds: Normal heart sounds. No murmur heard. Pulmonary:     Effort: Pulmonary effort is normal. No respiratory distress.     Breath sounds: Normal breath sounds. No wheezing or rales.  Chest:     Chest wall: No tenderness.  Abdominal:     General: Bowel sounds are normal. There is no distension.     Palpations: Abdomen is soft. There is no mass.     Tenderness: There is no abdominal tenderness. There is no rebound.  Musculoskeletal:        General: No tenderness. Normal range of motion.     Cervical back: Normal  range of motion and  neck supple.  Lymphadenopathy:     Cervical: No cervical adenopathy.  Skin:    General: Skin is dry.     Findings: No rash.  Neurological:     Mental Status: She is alert and oriented to person, place, and time.     Cranial Nerves: No cranial nerve deficit.     Motor: No abnormal muscle tone.     Coordination: Coordination normal.  Psychiatric:        Mood and Affect: Affect normal.        Cognition and Memory: Memory normal.        Judgment: Judgment normal.         LABORATORY DATA:  I have reviewed the data as listed Lab Results  Component Value Date   WBC 8.2 07/13/2020   HGB 13.8 07/13/2020   HCT 43.1 07/13/2020   MCV 90.0 07/13/2020   PLT 128 (L) 07/13/2020   Recent Labs    10/26/19 0855 10/26/19 0855 01/13/20 0940 04/28/20 0000 05/09/20 1319 07/13/20 1256  NA 144  --  138 139 146* 140  K 3.9  --  4.0 3.5 3.9 4.0  CL 105  --  101 100 105 104  CO2 24  --  26 24* 24 29  GLUCOSE 112*  --  118*  --  112* 94  BUN 12  --  19 12 11 12   CREATININE 0.79  --  0.66 0.9 0.61 0.70  CALCIUM 9.5  --  9.0 9.1 9.3 9.5  GFRNONAA 78  --  >60 72  --  >60  GFRAA 90  --   --   --   --   --   PROT 7.3  --  7.7  --  7.0 7.7  ALBUMIN 4.3   < > 3.9 4.0 3.9 3.9  AST 18  --  21  --  15 20  ALT 20  --  23  --  20 21  ALKPHOS 69  --  52  --  55 56  BILITOT 0.4  --  0.7  --  0.3 0.8   < > = values in this interval not displayed.      RADIOGRAPHIC STUDIES: I have personally reviewed the radiological images as listed and agreed with the findings in the report.   ultrasound of the abdomen showed  no spleenmegaly. Fatty liver disease.  01/17/2017 CT chest abdomen pelvis with contrast showed no pathological adenopathy was identified, aortic atherosclerosis, three-vessel coronary artery atherosclerotic calcification. Cholecystectomy, small bladder cystocele due to pelvic floor laxity. Prominent stool throughout the colon favors constipation.   ASSESSMENT & PLAN:   1. Chronic ITP (idiopathic thrombocytopenia) (HCC)    Thrombocytopenia, secondary to chronic ITP, status post rituximab treatments Patient is doing very well clinically. Labs are reviewed and discussed with patient. Her platelet counts are 128,000 today, this is close to her baseline, and similar to her level 6 months ago.  Continue observation  #Fatty liver disease, no transaminitis.  Diet and exercise.  All questions were answered. follow-up lab MD in 6 months.   Earlie Server, MD, PhD Hematology Oncology [facility] Pager- [phone] 07/13/2020

## 2020-08-02 ENCOUNTER — Other Ambulatory Visit: Payer: Self-pay | Admitting: Family Medicine

## 2020-08-19 DIAGNOSIS — G5600 Carpal tunnel syndrome, unspecified upper limb: Secondary | ICD-10-CM | POA: Diagnosis not present

## 2020-08-19 DIAGNOSIS — D693 Immune thrombocytopenic purpura: Secondary | ICD-10-CM | POA: Diagnosis not present

## 2020-08-19 DIAGNOSIS — M79641 Pain in right hand: Secondary | ICD-10-CM | POA: Diagnosis not present

## 2020-08-19 DIAGNOSIS — M79642 Pain in left hand: Secondary | ICD-10-CM | POA: Diagnosis not present

## 2020-08-19 DIAGNOSIS — R768 Other specified abnormal immunological findings in serum: Secondary | ICD-10-CM | POA: Diagnosis not present

## 2020-08-19 DIAGNOSIS — M8580 Other specified disorders of bone density and structure, unspecified site: Secondary | ICD-10-CM | POA: Diagnosis not present

## 2020-08-29 ENCOUNTER — Other Ambulatory Visit: Payer: Self-pay | Admitting: Family Medicine

## 2020-08-29 DIAGNOSIS — I10 Essential (primary) hypertension: Secondary | ICD-10-CM

## 2020-08-29 DIAGNOSIS — G4700 Insomnia, unspecified: Secondary | ICD-10-CM

## 2020-08-29 DIAGNOSIS — E119 Type 2 diabetes mellitus without complications: Secondary | ICD-10-CM

## 2020-08-29 MED ORDER — SERTRALINE HCL 50 MG PO TABS: 100.0000 mg | ORAL_TABLET | Freq: Every day | ORAL | 0 refills | Status: DC

## 2020-08-29 MED ORDER — SIMVASTATIN 40 MG PO TABS: 40.0000 mg | ORAL_TABLET | Freq: Every day | ORAL | 0 refills | Status: DC

## 2020-08-29 MED ORDER — METFORMIN HCL ER 500 MG PO TB24: 500.0000 mg | ORAL_TABLET | Freq: Every day | ORAL | 1 refills | Status: DC

## 2020-08-29 MED ORDER — IRBESARTAN-HYDROCHLOROTHIAZIDE 150-12.5 MG PO TABS: ORAL_TABLET | ORAL | 1 refills | Status: DC

## 2020-08-29 MED ORDER — TRAZODONE HCL 50 MG PO TABS: 50.0000 mg | ORAL_TABLET | Freq: Every day | ORAL | 3 refills | Status: DC

## 2020-08-29 NOTE — Telephone Encounter (Signed)
Patient  is calling to request refill(s) on the following medications -  simvastatin  She needs this to go to [facility] [address] [place] [phone]   Pt states this medication was forgotten when sending the medication to the pharmacy   Thank you

## 2020-08-29 NOTE — Telephone Encounter (Signed)
Notes to clinic:  Patient requesting medication be sent to [facility] [place] as she left medication at home and is out of town.   Requested Prescriptions  Pending Prescriptions Disp Refills   traZODone (DESYREL) 50 MG tablet 180 tablet 3    Sig: Take 1-2 tablets (50-100 mg total) by mouth at bedtime.     Psychiatry: Antidepressants - Serotonin Modulator Passed - 08/29/2020  9:54 AM      Passed - Valid encounter within last 6 months    Recent Outpatient Visits           1 month ago SVT (supraventricular tachycardia) [facility])   [facility] Birdie Sons, MD   3 months ago [place], Donald E, MD   4 months ago Diabetes mellitus without complication [facility])   [facility] Birdie Sons, MD   10 months ago Diabetes mellitus without complication [facility])   [facility] Birdie Sons, MD   1 year ago Diabetes mellitus without complication [facility])   [facility] Birdie Sons, MD       Future Appointments             In 2 months Fisher, Kirstie Peri, MD [facility]             sertraline (ZOLOFT) 50 MG tablet 180 tablet 0    Sig: Take 2 tablets (100 mg total) by mouth daily.     Psychiatry:  Antidepressants - SSRI Passed - 08/29/2020  9:54 AM      Passed - Valid encounter within last 6 months    Recent Outpatient Visits           1 month ago SVT (supraventricular tachycardia) ([place])   [facility] Birdie Sons, MD   3 months ago [place], Donald E, MD   4 months ago Diabetes mellitus without complication [facility])   [facility] Birdie Sons, MD   10 months ago Diabetes mellitus without complication [facility])   [facility] Birdie Sons, MD   1 year ago Diabetes mellitus without complication [facility])   [facility] Birdie Sons, MD       Future Appointments              In 2 months Fisher, Kirstie Peri, MD [facility]             metFORMIN (GLUCOPHAGE-XR) 500 MG 24 hr tablet 90 tablet 1    Sig: Take 1 tablet (500 mg total) by mouth daily with breakfast.     Endocrinology:  Diabetes - Biguanides Passed - 08/29/2020  9:54 AM      Passed - Cr in normal range and within 360 days    Creatinine, Ser  Date Value Ref Range Status  07/13/2020 0.70 0.44 - 1.00 mg/dL Final          Passed - HBA1C is between 0 and 7.9 and within 180 days    Hemoglobin A1C  Date Value Ref Range Status  04/29/2020 6.4 (A) 4.0 - 5.6 % Final   Hgb A1c MFr Bld  Date Value Ref Range Status  11/11/2018 6.5 (H) 4.8 - 5.6 % Final    Comment:             Prediabetes: 5.7 - 6.4          Diabetes: >6.4  Glycemic control for adults with diabetes: <7.0           Passed - eGFR in normal range and within 360 days    GFR calc Af Amer  Date Value Ref Range Status  10/26/2019 90 >59 mL/min/1.73 Final    Comment:    **[facility] currently reports eGFR in compliance with the current**   recommendations of the [employer]. [facility] will   update reporting as new guidelines are published from the NKF-ASN   Task force.    GFR, Estimated  Date Value Ref Range Status  07/13/2020 >60 >60 mL/min Final    Comment:    (NOTE) Calculated using the CKD-EPI Creatinine Equation (2021)    eGFR  Date Value Ref Range Status  05/09/2020 98 >59 mL/min/1.73 Final          Passed - Valid encounter within last 6 months    Recent Outpatient Visits           1 month ago SVT (supraventricular tachycardia) ([place])   [facility] Birdie Sons, MD   3 months ago [place], Donald E, MD   4 months ago Diabetes mellitus without complication [facility])   [facility] Birdie Sons, MD   10 months ago Diabetes mellitus without complication [facility])   [facility]  Birdie Sons, MD   1 year ago Diabetes mellitus without complication [facility])   [facility] Birdie Sons, MD       Future Appointments             In 2 months Fisher, Kirstie Peri, MD [facility]             irbesartan-hydrochlorothiazide (AVALIDE) 150-12.5 MG tablet 90 tablet 1    Sig: TAKE 1 TABLET BY MOUTH  DAILY     Cardiovascular: ARB + Diuretic Combos Passed - 08/29/2020  9:54 AM      Passed - K in normal range and within 180 days    Potassium  Date Value Ref Range Status  07/13/2020 4.0 3.5 - 5.1 mmol/L Final          Passed - Na in normal range and within 180 days    Sodium  Date Value Ref Range Status  07/13/2020 140 135 - 145 mmol/L Final  05/09/2020 146 (H) 134 - 144 mmol/L Final          Passed - Cr in normal range and within 180 days    Creatinine, Ser  Date Value Ref Range Status  07/13/2020 0.70 0.44 - 1.00 mg/dL Final          Passed - Ca in normal range and within 180 days    Calcium  Date Value Ref Range Status  07/13/2020 9.5 8.9 - 10.3 mg/dL Final          Passed - Patient is not pregnant      Passed - Last BP in normal range    BP Readings from Last 1 Encounters:  07/13/20 113/72          Passed - Valid encounter within last 6 months    Recent Outpatient Visits           1 month ago SVT (supraventricular tachycardia) [facility])   [facility] Birdie Sons, MD   3 months ago [place], Donald E, MD   4 months ago Diabetes mellitus without  complication [facility])   [facility] Birdie Sons, MD   10 months ago Diabetes mellitus without complication [facility])   [facility] Birdie Sons, MD   1 year ago Diabetes mellitus without complication [facility])   [facility] Birdie Sons, MD       Future Appointments             In 2 months Fisher, Kirstie Peri, MD [facility] [place]

## 2020-08-29 NOTE — Telephone Encounter (Signed)
Patient requesting a 2 week supply, patient left medication at home and is currently out of town, please send sertraline (ZOLOFT) 50 MG tablet, irbesartan-hydrochlorothiazide (AVALIDE) 150-12.5 MG tablet, metFORMIN (GLUCOPHAGE-XR) 500 MG 24 hr tablet, traZODone (DESYREL) 50 MG tablet, simvastatin (ZOCOR) 40 MG tablet.   [facility] [address] [place] [phone]   Patient would like a follow up call when completed at [phone]

## 2020-09-06 ENCOUNTER — Telehealth: Payer: Self-pay | Admitting: *Deleted

## 2020-09-06 NOTE — Telephone Encounter (Signed)
L/m for patient regarding PAP renewal and needs MD appt for same

## 2020-09-15 ENCOUNTER — Encounter: Payer: Self-pay | Admitting: Allergy & Immunology

## 2020-09-15 ENCOUNTER — Other Ambulatory Visit: Payer: Self-pay

## 2020-09-15 ENCOUNTER — Ambulatory Visit: Payer: Medicare Other | Admitting: Allergy & Immunology

## 2020-09-15 VITALS — BP 116/74 | HR 69 | Temp 97.7°F | Resp 16 | Ht 61.0 in | Wt 160.0 lb

## 2020-09-15 DIAGNOSIS — B999 Unspecified infectious disease: Secondary | ICD-10-CM

## 2020-09-15 DIAGNOSIS — L508 Other urticaria: Secondary | ICD-10-CM | POA: Diagnosis not present

## 2020-09-15 NOTE — Progress Notes (Signed)
FOLLOW UP  Date of Service/Encounter:  09/15/20   Assessment:   Chronic urticaria - controlled with Xolair monthly, but with some breakthrough episodes   Recurrent infections - mostly sinopulmonary in nature (repeating Pneumococcal titers today)   Chronic ITP - with round of rituximab three years ago (weekly x 4 doses)    Plan/Recommendations:   1. Chronic urticaria - Continue with Allegra in the morning AS NEEDED.  - We are going to continue with Xolair every two weeks to see if this helps.  - You seem to be doing very well with the current dosing.  - Maybe we can decrease to once monthly at the next visit.   2. Return in about 1 year (around 09/15/2021).   Subjective:   Julia Garner is a 70 y.o. female presenting today for follow up of  Chief Complaint  Patient presents with   Follow-up    Julia Garner has a history of the following: Patient Active Problem List   Diagnosis Date Noted   SVT (supraventricular tachycardia) (HCC) 06/08/2020   Bilateral carpal tunnel syndrome 07/31/2019   Aortic atherosclerosis (HCC) 11/04/2018   Fatty liver 11/04/2018   Chronic urticaria 08/15/2018   Chronic ITP (idiopathic thrombocytopenia) (HCC) 08/15/2018   Recurrent infections 08/15/2018   Diabetes mellitus without complication (HCC) 09/15/2017   Acute ITP (HCC) 09/16/2016   Allergic rhinitis 06/29/2014   Insomnia 06/29/2014   Arthralgia of temporomandibular joint 06/29/2014   Edema 06/29/2014   Oral aphthae 07/08/2007   Cutaneous lipodystrophy 06/11/2007   Arthropathy of hand 01/25/2007   Fam hx-ischem heart disease 01/21/2007   Menopausal and postmenopausal disorder 10/08/2005   Tachycardia 10/08/2005   Thrombocytopenia (HCC) 10/08/2005   Obstructive sleep apnea of adult 10/24/2004   Essential (primary) hypertension 01/16/2004   Mood disorder (HCC) 01/15/2002    History obtained from: chart review and patient.  Julia Garner is a 70 y.o. female presenting  for a follow up visit.  She was last seen in September 2021.  At that time, we continue with Allegra in the morning.  We also continue his Xolair and increase it to every 2 weeks.  For her immune deficiency work-up, we got repeat pneumococcal titers.  Her titers are protective to 17 out of 23 serotypes of Streptococcus pneumonia compared to 11 out of 23 prior to the vaccine.  We felt this was an excellent response.  Since last visit, she has done well. She does get intermittent breakouts in her head and on her back. She has been using Aveeno lotion and the antihistamine is enough to take care of it. She is getting the Xolair every 14 days. She injects this at home without a problem. She likes not having to come in. She has not needed prednisone for her hives. She has done well overall. She is not interested in doing anything differently with regards to her hives.   She did require some prednisone have she traveled to [place] for five weeks. 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 This is her [place].   She denies any sinus infections. She has not needed antibiotics for anything in quite some time.   Otherwise, there have been no changes to her past medical history, surgical history, family history, or social history.    Review of Systems  Constitutional: Negative.  Negative for chills, fever, malaise/fatigue and weight loss.  HENT:  Negative for  congestion, ear discharge, ear pain and sinus pain.   Eyes:  Negative for pain, discharge and redness.  Respiratory:  Negative for cough, sputum production, shortness of breath, wheezing and stridor.   Cardiovascular: Negative.  Negative for chest pain and palpitations.  Gastrointestinal:  Negative for abdominal pain, constipation, diarrhea, heartburn, nausea and vomiting.  Skin:  Positive for itching and rash.  Neurological:  Negative for  dizziness and headaches.  Endo/Heme/Allergies:  Negative for environmental allergies. Does not bruise/bleed easily.      Objective:   Blood pressure 116/74, pulse 69, temperature 97.7 F (36.5 C), temperature source Temporal, resp. rate 16, height 5\' 1"  (1.549 m), weight 160 lb (72.6 kg), SpO2 96 %. Body mass index is 30.23 kg/m.   Physical Exam:  Physical Exam Constitutional:      Appearance: She is well-developed.     Comments: Very lovely person. Talkative.   HENT:     Head: Normocephalic and atraumatic.     Right Ear: Tympanic membrane, ear canal and external ear normal.     Left Ear: Tympanic membrane, ear canal and external ear normal.     Nose: No nasal deformity, septal deviation, mucosal edema or rhinorrhea.     Right Turbinates: Enlarged and swollen.     Left Turbinates: Enlarged and swollen.     Right Sinus: No maxillary sinus tenderness or frontal sinus tenderness.     Left Sinus: No maxillary sinus tenderness or frontal sinus tenderness.     Mouth/Throat:     Mouth: Mucous membranes are not pale and not dry.     Pharynx: Uvula midline.  Eyes:     General: Lids are normal. No allergic shiner.       Right eye: No discharge.        Left eye: No discharge.     Conjunctiva/sclera: Conjunctivae normal.     Right eye: Right conjunctiva is not injected. No chemosis.    Left eye: Left conjunctiva is not injected. No chemosis.    Pupils: Pupils are equal, round, and reactive to light.  Cardiovascular:     Rate and Rhythm: Normal rate and regular rhythm.     Heart sounds: Normal heart sounds.  Pulmonary:     Effort: Pulmonary effort is normal. No tachypnea, accessory muscle usage or respiratory distress.     Breath sounds: Normal breath sounds. No wheezing, rhonchi or rales.     Comments: Moving air well in all lung fields. No increased work of breathing noted.  Chest:     Chest wall: No tenderness.  Lymphadenopathy:     Cervical: No cervical adenopathy.  Skin:     General: Skin is warm.     Capillary Refill: Capillary refill takes less than 2 seconds.     Coloration: Skin is not pale.     Findings: No abrasion, erythema, petechiae or rash. Rash is not papular, urticarial or vesicular.     Comments: No eczematous or urticarial lesions noted.   Neurological:     Mental Status: She is alert.  Psychiatric:        Behavior: Behavior is cooperative.     Diagnostic studies: none        , MD  [facility]

## 2020-09-15 NOTE — Patient Instructions (Addendum)
1. Chronic urticaria - Continue with Allegra in the morning AS NEEDED.  - We are going to continue with Xolair every two weeks to see if this helps.  - You seem to be doing very well with the current dosing.  - Maybe we can decrease to once monthly at the next visit.   2. Return in about 1 year (around 09/15/2021).    Please inform us of any Emergency Department visits, hospitalizations, or changes in symptoms. Call us before going to the ED for breathing or allergy symptoms since we might be able to fit you in for a sick visit. Feel free to contact us anytime with any questions, problems, or concerns.  It was a pleasure to see you again today!  Websites that have reliable patient information: 1. American Academy of Asthma, Allergy, and Immunology: www.aaaai.org 2. Food Allergy Research and Education (FARE): foodallergy.org 3. Mothers of Asthmatics: http://www.asthmacommunitynetwork.org 4. American College of Allergy, Asthma, and Immunology: www.acaai.org   COVID-19 Vaccine Information can be found at: [email or web address].[email or web address] For questions related to vaccine distribution or appointments, please email [email or web address] or call [phone].     "Like" [place] on Facebook and Instagram for our latest updates!        Make sure you are registered to vote! If you have moved or changed any of your contact information, you will need to get this updated before voting!  In some cases, you MAY be able to register to vote online: [email or web address]

## 2020-09-19 ENCOUNTER — Encounter: Payer: Self-pay | Admitting: Allergy & Immunology

## 2020-10-02 ENCOUNTER — Other Ambulatory Visit: Payer: Self-pay | Admitting: Family Medicine

## 2020-10-11 ENCOUNTER — Ambulatory Visit: Payer: Self-pay | Admitting: *Deleted

## 2020-10-11 NOTE — Telephone Encounter (Signed)
Patient called, left VM to return the call to the office to discuss symptoms with a nurse. Unable to reach patient after 3 attempts by [facility] NT, routing to the provider for resolution per protocol.    Summary: Clinical Advice   Patient  would like to know if she can give herself a allergy shot and receive the flu shot on the same day.

## 2020-10-11 NOTE — Telephone Encounter (Signed)
Second attempt to reach patient- left message to call office °

## 2020-10-11 NOTE — Telephone Encounter (Signed)
Summary: Clinical Advice   Patient  would like to know if she can give herself a allergy shot and receive the flu shot on the same day.     Attempted to call patient- left message to call office

## 2020-10-12 NOTE — Telephone Encounter (Signed)
Pt advised to contact her allergist.   Thanks,   Vernona Rieger

## 2020-10-12 NOTE — Telephone Encounter (Signed)
I'm not sure. She should check with her allergist about that.

## 2020-10-13 NOTE — Progress Notes (Signed)
[id number] 

## 2020-10-19 ENCOUNTER — Ambulatory Visit: Payer: Medicare Other

## 2020-11-01 ENCOUNTER — Ambulatory Visit: Payer: Self-pay | Admitting: Family Medicine

## 2020-11-09 ENCOUNTER — Ambulatory Visit (INDEPENDENT_AMBULATORY_CARE_PROVIDER_SITE_OTHER): Payer: Medicare Other | Admitting: Family Medicine

## 2020-11-09 ENCOUNTER — Encounter: Payer: Self-pay | Admitting: Family Medicine

## 2020-11-09 ENCOUNTER — Other Ambulatory Visit: Payer: Self-pay

## 2020-11-09 VITALS — BP 106/59 | HR 71 | Temp 98.5°F | Resp 16 | Wt 160.0 lb

## 2020-11-09 DIAGNOSIS — I10 Essential (primary) hypertension: Secondary | ICD-10-CM | POA: Diagnosis not present

## 2020-11-09 DIAGNOSIS — Z23 Encounter for immunization: Secondary | ICD-10-CM

## 2020-11-09 DIAGNOSIS — I700 Atherosclerosis of aorta: Secondary | ICD-10-CM | POA: Diagnosis not present

## 2020-11-09 DIAGNOSIS — E119 Type 2 diabetes mellitus without complications: Secondary | ICD-10-CM

## 2020-11-09 DIAGNOSIS — F39 Unspecified mood [affective] disorder: Secondary | ICD-10-CM | POA: Diagnosis not present

## 2020-11-09 LAB — POCT GLYCOSYLATED HEMOGLOBIN (HGB A1C)
Est. average glucose Bld gHb Est-mCnc: 128
Hemoglobin A1C: 6.1 % — AB (ref 4.0–5.6)

## 2020-11-09 MED ORDER — IRBESARTAN 75 MG PO TABS: 75.0000 mg | ORAL_TABLET | Freq: Every day | ORAL | 1 refills | Status: DC

## 2020-11-09 NOTE — Patient Instructions (Signed)
Please review the attached list of medications and notify my office if there are any errors.   The CDC recommends two doses of Shingrix (the shingles vaccine) separated by 2 to 6 months for adults age 68 years and older. I recommend checking with your insurance plan regarding coverage for this vaccine.   

## 2020-11-09 NOTE — Progress Notes (Signed)
Established patient visit   Patient: Julia Garner   DOB: 1950-07-09   70 y.o. Female  MRN: [id number] Visit Date: 11/09/2020  Today's healthcare provider: Lelon Huh, MD   Chief Complaint  Patient presents with   Diabetes   Hypertension    Subjective    HPI  Diabetes Mellitus Type II, Follow-up  Lab Results  Component Value Date   HGBA1C 6.1 (A) 11/09/2020   HGBA1C 6.4 (A) 04/29/2020   HGBA1C 6.1 (A) 10/26/2019   Wt Readings from Last 3 Encounters:  11/09/20 160 lb (72.6 kg)  09/15/20 160 lb (72.6 kg)  07/13/20 158 lb (71.7 kg)   Last seen for diabetes 6 months ago.  Management since then includes continuing same medications. She reports good compliance with treatment. She is not having side effects.  Symptoms: No fatigue No foot ulcerations  No appetite changes No nausea  No paresthesia of the feet  No polydipsia  No polyuria No visual disturbances   No vomiting     Home blood sugar records: fasting range: 114-140  Episodes of hypoglycemia? No    Current insulin regiment: none Most Recent Eye Exam: 04/30/2020 Current exercise: none Current diet habits: well balanced  Pertinent Labs: Lab Results  Component Value Date   CHOL 134 10/26/2019   HDL 31 (L) 10/26/2019   LDLCALC 67 10/26/2019   TRIG 218 (H) 10/26/2019   CHOLHDL 4.3 10/26/2019   Lab Results  Component Value Date   NA 140 07/13/2020   K 4.0 07/13/2020   CREATININE 0.70 07/13/2020   EGFR 98 05/09/2020   GFRNONAA >60 07/13/2020   GLUCOSE 94 07/13/2020     ---------------------------------------------------------------------------------------------------   Hypertension, follow-up  BP Readings from Last 3 Encounters:  11/09/20 (!) 106/59  09/15/20 116/74  07/13/20 113/72   Wt Readings from Last 3 Encounters:  11/09/20 160 lb (72.6 kg)  09/15/20 160 lb (72.6 kg)  07/13/20 158 lb (71.7 kg)     She was last seen for hypertension 6 months ago.  BP at that visit was  91/60. Management since that visit includes decreasing irbesartan- HCTZ to 1/2 tablet daily.  She reports good compliance with treatment. She is not having side effects.  She is following a Regular diet. She is not exercising. She does not smoke.  Use of agents associated with hypertension: none.   Outside blood pressures are not checked. Symptoms: No chest pain No chest pressure  No palpitations No syncope  No dyspnea No orthopnea  No paroxysmal nocturnal dyspnea No lower extremity edema   Pertinent labs: Lab Results  Component Value Date   CHOL 134 10/26/2019   HDL 31 (L) 10/26/2019   LDLCALC 67 10/26/2019   TRIG 218 (H) 10/26/2019   CHOLHDL 4.3 10/26/2019   Lab Results  Component Value Date   NA 140 07/13/2020   K 4.0 07/13/2020   CREATININE 0.70 07/13/2020   GFRNONAA >60 07/13/2020   GLUCOSE 94 07/13/2020   TSH 2.870 09/13/2017     The 10-year ASCVD risk score (Arnett DK, et al., 2019) is: 13.4%   ---------------------------------------------------------------------------------------------------     Medications: Outpatient Medications Prior to Visit  Medication Sig   Bioflavonoid Products (VITAMIN C PLUS) 500 MG TABS Take 2 tablets by mouth daily.    Blood Glucose Monitoring Suppl (ONE TOUCH ULTRA 2) w/Device KIT Use to check sugar daily for type 2 diabetes E11.9   Calcium Carbonate-Vit D-Min (CALCIUM 1200 PO) Take by mouth.    cetirizine (  ZYRTEC) 10 MG tablet Take 20 mg by mouth at bedtime as needed.    cetirizine-pseudoephedrine (ZYRTEC-D) 5-120 MG tablet Take 1 tablet by mouth 2 (two) times daily. (Patient taking differently: Take 1 tablet by mouth 2 (two) times daily as needed.)   clobetasol (TEMOVATE) 0.05 % external solution Apply 1 application topically 2 (two) times daily.   famotidine (PEPCID) 40 MG tablet Take 40 mg by mouth daily as needed.    fexofenadine (ALLEGRA) 180 MG tablet Take 180 mg by mouth daily as needed.    glucosamine-chondroitin  500-400 MG tablet Take 1 tablet by mouth 3 (three) times daily.   glucose blood (ONETOUCH ULTRA) test strip Use to check sugar twice daily for type 2 diabetes E11.9   irbesartan-hydrochlorothiazide (AVALIDE) 150-12.5 MG tablet TAKE 1 TABLET BY MOUTH  DAILY (Patient taking differently: 0.5 tablets. TAKE 1 TABLET BY MOUTH  DAILY)   Lancets (ONETOUCH ULTRASOFT) lancets Use to check sugar twice daily for type 2 diabetes E11.9   metFORMIN (GLUCOPHAGE-XR) 500 MG 24 hr tablet Take 1 tablet (500 mg total) by mouth daily with breakfast.   Multiple Vitamin (MULTIVITAMIN) tablet Take 1 tablet by mouth daily.   Naproxen Sodium 220 MG CAPS Take by mouth daily as needed.   omalizumab Arvid Right) 150 MG/ML prefilled syringe Inject 300 mg into the skin every 14 (fourteen) days.   Omega 3 1200 MG CAPS Take 2 capsules by mouth daily.   sertraline (ZOLOFT) 50 MG tablet TAKE 2 TABLETS BY MOUTH  DAILY   simvastatin (ZOCOR) 40 MG tablet Take 1 tablet (40 mg total) by mouth at bedtime.   traZODone (DESYREL) 50 MG tablet Take 1-2 tablets (50-100 mg total) by mouth at bedtime.   triamcinolone (KENALOG) 0.1 % Apply 1 application topically 2 (two) times daily.   Ubiquinol 200 MG CAPS Take by mouth. CoQ10   [DISCONTINUED] b complex vitamins capsule Take 1 capsule by mouth daily. (Patient not taking: Reported on 11/09/2020)   [DISCONTINUED] Zinc 50 MG TABS Take 1 tablet by mouth daily. (Patient not taking: Reported on 11/09/2020)   Facility-Administered Medications Prior to Visit  Medication Dose Route Frequency Provider   omalizumab Arvid Right) injection 300 mg  300 mg Subcutaneous Q28 days Valentina Shaggy, MD    Review of Systems  Constitutional:  Negative for appetite change, chills, fatigue and fever.  Respiratory:  Negative for chest tightness and shortness of breath.   Cardiovascular:  Negative for chest pain and palpitations.  Gastrointestinal:  Negative for abdominal pain, nausea and vomiting.  Neurological:   Negative for dizziness and weakness.      Objective    BP (!) 106/59 (BP Location: Left Arm, Patient Position: Sitting, Cuff Size: Large)   Pulse 71   Temp 98.5 F (36.9 C) (Oral)   Resp 16   Wt 160 lb (72.6 kg)   SpO2 98% Comment: room air  BMI 30.23 kg/m  {Show previous vital signs (optional):23777}  Physical Exam   General appearance: Mildly obese female, cooperative and in no acute distress Head: Normocephalic, without obvious abnormality, atraumatic Respiratory: Respirations even and unlabored, normal respiratory rate Extremities: All extremities are intact.  Skin: Skin color, texture, turgor normal. No rashes seen  Psych: Appropriate mood and affect. Neurologic: Mental status: Alert, oriented to person, place, and time, thought content appropriate.   Results for orders placed or performed in visit on 11/09/20  POCT HgB A1C  Result Value Ref Range   Hemoglobin A1C 6.1 (A) 4.0 - 5.6 %  Est. average glucose Bld gHb Est-mCnc 128     Assessment & Plan     1. Diabetes mellitus without complication ([place]) Well controlled.  Continue current medications.    2. Need for influenza vaccination  - Flu Vaccine QUAD High Dose IM (Fluad)  3. Mood disorder ([place]) Doing well on current medications. She is interested in genetic testing when available.   4. Aortic atherosclerosis ([place]) She is tolerating pravastatin well with no adverse effects.  Asymptomatic. Compliant with medication.  Continue aggressive risk factor modification.    5. Hypertension She is now only taking 1/2 tablet of irbesartan-hctz 150/12.5. will change to irbesartan (AVAPRO) 75 MG tablet; Take 1 tablet (75 mg total) by mouth daily.  Dispense: 90 tablet; Refill: 1   Encouraged healthy diet.  Future Appointments  Date Time Provider [place]  01/11/2021  2:00 PM [facility] None  01/11/2021  2:15 PM Earlie Server, MD [facility] None  03/21/2021 11:00 AM Caryn Section Kirstie Peri, MD [facility]           The entirety of the information documented in the History of Present Illness, Review of Systems and Physical Exam were personally obtained by me. Portions of this information were initially documented by the CMA and reviewed by me for thoroughness and accuracy.     Lelon Huh, MD  [facility] [phone] (phone) [phone] (fax)  [place]

## 2020-11-15 ENCOUNTER — Telehealth (INDEPENDENT_AMBULATORY_CARE_PROVIDER_SITE_OTHER): Payer: Medicare Other

## 2020-11-15 DIAGNOSIS — Z1211 Encounter for screening for malignant neoplasm of colon: Secondary | ICD-10-CM

## 2020-11-15 DIAGNOSIS — R195 Other fecal abnormalities: Secondary | ICD-10-CM

## 2020-11-15 LAB — IFOBT (OCCULT BLOOD): IFOBT: POSITIVE

## 2020-11-15 NOTE — Telephone Encounter (Signed)
Patient returned OC light test today. Result was positive. Please review results.

## 2020-11-17 NOTE — Addendum Note (Signed)
Addended by: Malva Limes on: 11/17/2020 01:47 PM   Modules accepted: Orders

## 2020-11-18 ENCOUNTER — Telehealth: Payer: Self-pay

## 2020-11-18 DIAGNOSIS — R195 Other fecal abnormalities: Secondary | ICD-10-CM

## 2020-11-18 NOTE — Telephone Encounter (Signed)
Ross Ludwig, RN  11/17/2020  9:44 AM EDT Back to Top    Pt given results per notes of Dr. Sherrie Mustache, MD on 11/15/20. Pt verbalized understanding. Pt is ok with moving forward with referral just wants someone to call her with info on appt and where referral will be sent.    Benjiman Core, [facility]  11/16/2020  3:41 PM EDT     Tried calling patient. Left message to call back. OK for [facility] triage to advise.    Malva Limes, MD  11/15/2020 12:29 PM EDT     OC light test is positive. She needs referral to GI for further evaluation.

## 2020-11-24 ENCOUNTER — Telehealth: Payer: Self-pay

## 2020-11-24 NOTE — Telephone Encounter (Signed)
Patient is ready to schedule procedure. Clinical staff will follow up with patient. °

## 2020-11-28 ENCOUNTER — Other Ambulatory Visit: Payer: Self-pay

## 2020-11-28 DIAGNOSIS — Z1211 Encounter for screening for malignant neoplasm of colon: Secondary | ICD-10-CM

## 2020-11-28 DIAGNOSIS — R195 Other fecal abnormalities: Secondary | ICD-10-CM

## 2020-11-28 MED ORDER — CLENPIQ 10-3.5-12 MG-GM -GM/160ML PO SOLN: 1.0000 | Freq: Once | ORAL | 0 refills | Status: AC

## 2020-11-28 NOTE — Telephone Encounter (Signed)
Procedure scheduled for 12/20/20. 

## 2020-11-28 NOTE — Progress Notes (Signed)
Gastroenterology Pre-Procedure Review  Request Date: 12/20/20 Requesting Physician: Dr. Allen Norris  PATIENT REVIEW QUESTIONS: The patient responded to the following health history questions as indicated:    1. Are you having any GI issues? no 2. Do you have a personal history of Polyps? no 3. Do you have a family history of Colon Cancer or Polyps? no 4. Diabetes Mellitus? yes (Type II) 5. Joint replacements in the past 12 months?no 6. Major health problems in the past 3 months?no 7. Any artificial heart valves, MVP, or defibrillator?no    MEDICATIONS & ALLERGIES:    Patient reports the following regarding taking any anticoagulation/antiplatelet therapy:   Plavix, Coumadin, Eliquis, Xarelto, Lovenox, Pradaxa, Brilinta, or Effient? no Aspirin? no  Patient confirms/reports the following medications:  Current Outpatient Medications  Medication Sig Dispense Refill   Bioflavonoid Products (VITAMIN C PLUS) 500 MG TABS Take 2 tablets by mouth daily.      Blood Glucose Monitoring Suppl (ONE TOUCH ULTRA 2) w/Device KIT Use to check sugar daily for type 2 diabetes E11.9 1 kit 0   Calcium Carbonate-Vit D-Min (CALCIUM 1200 PO) Take by mouth.      cetirizine (ZYRTEC) 10 MG tablet Take 20 mg by mouth at bedtime as needed.      cetirizine-pseudoephedrine (ZYRTEC-D) 5-120 MG tablet Take 1 tablet by mouth 2 (two) times daily. (Patient taking differently: Take 1 tablet by mouth 2 (two) times daily as needed.) 60 tablet 5   clobetasol (TEMOVATE) 0.05 % external solution Apply 1 application topically 2 (two) times daily. 50 mL 6   famotidine (PEPCID) 40 MG tablet Take 40 mg by mouth daily as needed.      fexofenadine (ALLEGRA) 180 MG tablet Take 180 mg by mouth daily as needed.      glucosamine-chondroitin 500-400 MG tablet Take 1 tablet by mouth 3 (three) times daily.     glucose blood (ONETOUCH ULTRA) test strip Use to check sugar twice daily for type 2 diabetes E11.9 100 each 4   irbesartan (AVAPRO) 75 MG  tablet Take 1 tablet (75 mg total) by mouth daily. 90 tablet 1   Lancets (ONETOUCH ULTRASOFT) lancets Use to check sugar twice daily for type 2 diabetes E11.9 100 each 4   metFORMIN (GLUCOPHAGE-XR) 500 MG 24 hr tablet Take 1 tablet (500 mg total) by mouth daily with breakfast. 90 tablet 1   Multiple Vitamin (MULTIVITAMIN) tablet Take 1 tablet by mouth daily.     Naproxen Sodium 220 MG CAPS Take by mouth daily as needed.     omalizumab Arvid Right) 150 MG/ML prefilled syringe Inject 300 mg into the skin every 14 (fourteen) days. 12 mL 3   Omega 3 1200 MG CAPS Take 2 capsules by mouth daily.     sertraline (ZOLOFT) 50 MG tablet TAKE 2 TABLETS BY MOUTH  DAILY 180 tablet 4   simvastatin (ZOCOR) 40 MG tablet Take 1 tablet (40 mg total) by mouth at bedtime. 90 tablet 0   traZODone (DESYREL) 50 MG tablet Take 1-2 tablets (50-100 mg total) by mouth at bedtime. 180 tablet 3   triamcinolone (KENALOG) 0.1 % Apply 1 application topically 2 (two) times daily. 80 g 4   Ubiquinol 200 MG CAPS Take by mouth. CoQ10     Current Facility-Administered Medications  Medication Dose Route Frequency Provider Last Rate Last Admin   omalizumab Arvid Right) injection 300 mg  300 mg Subcutaneous Q28 days Valentina Shaggy, MD   300 mg at 09/29/19 1523    Patient confirms/reports  the following allergies:  Allergies  Allergen Reactions   Niacin And Related     As component of simcor ([place])   Penicillins Itching    Hot flashes.   Tylenol  [Acetaminophen]     No orders of the defined types were placed in this encounter.   AUTHORIZATION INFORMATION Primary Insurance: 1D#: Group #:  Secondary Insurance: 1D#: Group #:  SCHEDULE INFORMATION: Date: 12/20/20 Time: Location: [facility]

## 2020-12-05 ENCOUNTER — Telehealth: Payer: Self-pay | Admitting: Gastroenterology

## 2020-12-05 NOTE — Telephone Encounter (Signed)
Patient wants a call back from CMA .the patient states that her prep medication hasn't been sent in.

## 2020-12-07 ENCOUNTER — Telehealth: Payer: Self-pay | Admitting: Gastroenterology

## 2020-12-07 NOTE — Telephone Encounter (Signed)
Inbound call from pt requesting a call back stating that she didn't receive her prep. Please advise. Thank you.

## 2020-12-12 ENCOUNTER — Other Ambulatory Visit: Payer: Self-pay

## 2020-12-12 MED ORDER — CLENPIQ 10-3.5-12 MG-GM -GM/160ML PO SOLN: 320.0000 mL | ORAL | 0 refills | Status: DC

## 2020-12-12 NOTE — Telephone Encounter (Signed)
Spoke with pt and Clenpiq has been sent to pt's pharmacy per request.

## 2020-12-19 ENCOUNTER — Encounter: Payer: Self-pay | Admitting: Gastroenterology

## 2020-12-20 ENCOUNTER — Ambulatory Visit: Payer: Medicare Other | Admitting: Anesthesiology

## 2020-12-20 ENCOUNTER — Encounter: Payer: Self-pay | Admitting: Gastroenterology

## 2020-12-20 ENCOUNTER — Ambulatory Visit
Admission: RE | Admit: 2020-12-20 | Discharge: 2020-12-20 | Disposition: A | Discharge status: Home / Self Care | Payer: Medicare Other | Attending: Gastroenterology | Admitting: Gastroenterology

## 2020-12-20 ENCOUNTER — Encounter
Admission: RE | Disposition: A | Discharge status: Home / Self Care | Payer: Self-pay | Source: Home / Self Care | Attending: Gastroenterology

## 2020-12-20 ENCOUNTER — Other Ambulatory Visit: Payer: Self-pay

## 2020-12-20 DIAGNOSIS — K640 First degree hemorrhoids: Secondary | ICD-10-CM | POA: Diagnosis not present

## 2020-12-20 DIAGNOSIS — E119 Type 2 diabetes mellitus without complications: Secondary | ICD-10-CM | POA: Diagnosis not present

## 2020-12-20 DIAGNOSIS — I10 Essential (primary) hypertension: Secondary | ICD-10-CM | POA: Insufficient documentation

## 2020-12-20 DIAGNOSIS — G4730 Sleep apnea, unspecified: Secondary | ICD-10-CM | POA: Diagnosis not present

## 2020-12-20 DIAGNOSIS — K621 Rectal polyp: Secondary | ICD-10-CM | POA: Insufficient documentation

## 2020-12-20 DIAGNOSIS — Z1211 Encounter for screening for malignant neoplasm of colon: Secondary | ICD-10-CM | POA: Diagnosis not present

## 2020-12-20 DIAGNOSIS — R195 Other fecal abnormalities: Secondary | ICD-10-CM | POA: Diagnosis not present

## 2020-12-20 DIAGNOSIS — K635 Polyp of colon: Secondary | ICD-10-CM

## 2020-12-20 DIAGNOSIS — G4733 Obstructive sleep apnea (adult) (pediatric): Secondary | ICD-10-CM | POA: Diagnosis not present

## 2020-12-20 DIAGNOSIS — F32A Depression, unspecified: Secondary | ICD-10-CM | POA: Diagnosis not present

## 2020-12-20 HISTORY — PX: COLONOSCOPY WITH PROPOFOL: SHX5780

## 2020-12-20 LAB — GLUCOSE, CAPILLARY: Glucose-Capillary: 119 mg/dL — ABNORMAL HIGH (ref 70–99)

## 2020-12-20 SURGERY — COLONOSCOPY WITH PROPOFOL
Anesthesia: General

## 2020-12-20 MED ORDER — LIDOCAINE HCL (CARDIAC) PF 100 MG/5ML IV SOSY
PREFILLED_SYRINGE | INTRAVENOUS | Status: DC | PRN
  Administered 2020-12-20: 50 mg via INTRAVENOUS

## 2020-12-20 MED ORDER — PROPOFOL 500 MG/50ML IV EMUL
INTRAVENOUS | Status: DC | PRN
  Administered 2020-12-20: 120 ug/kg/min via INTRAVENOUS

## 2020-12-20 MED ORDER — PROPOFOL 10 MG/ML IV BOLUS
INTRAVENOUS | Status: DC | PRN
  Administered 2020-12-20: 100 mg via INTRAVENOUS

## 2020-12-20 MED ORDER — SODIUM CHLORIDE 0.9 % IV SOLN: INTRAVENOUS | Status: DC

## 2020-12-20 NOTE — H&P (Signed)
Julia Lame, MD [address] [place], [place] [address] Phone:[phone] Fax : [phone]  Primary Care Physician:  Birdie Sons, MD Primary Gastroenterologist:  Dr. Allen Norris  Pre-Procedure History & Physical: HPI:  Julia Garner is a 70 y.o. female is here for an colonoscopy.   Past Medical History:  Diagnosis Date   Allergy    Chronic ITP (idiopathic thrombocytopenia) (HCC)    Chronic urticaria 08/15/2018   Depression    Head injuries    History of seizure 2007   one seizure    Hypertension     Past Surgical History:  Procedure Laterality Date   CATARACT EXTRACTION, BILATERAL  around 2021   Dr. Ellin Mayhew   CESAREAN SECTION  1990   CHOLECYSTECTOMY  2005   CYST EXCISION  1996   Tracheal Cyst    Prior to Admission medications   Medication Sig Start Date End Date Taking? Authorizing Provider  Bioflavonoid Products (VITAMIN C PLUS) 500 MG TABS Take 2 tablets by mouth daily.    Yes [provider]  Calcium Carbonate-Vit D-Min (CALCIUM 1200 PO) Take by mouth.    Yes [provider]  cetirizine (ZYRTEC) 10 MG tablet Take 20 mg by mouth at bedtime as needed.    Yes [provider]  famotidine (PEPCID) 40 MG tablet Take 40 mg by mouth daily as needed.    Yes [provider]  fexofenadine (ALLEGRA) 180 MG tablet Take 180 mg by mouth daily as needed.    Yes [provider]  glucosamine-chondroitin 500-400 MG tablet Take 1 tablet by mouth 3 (three) times daily.   Yes [provider]  irbesartan (AVAPRO) 75 MG tablet Take 1 tablet (75 mg total) by mouth daily. 11/09/20  Yes Birdie Sons, MD  metFORMIN (GLUCOPHAGE-XR) 500 MG 24 hr tablet Take 1 tablet (500 mg total) by mouth daily with breakfast. 08/29/20  Yes Birdie Sons, MD  Multiple Vitamin (MULTIVITAMIN) tablet Take 1 tablet by mouth daily.   Yes [provider]  Naproxen Sodium 220 MG CAPS Take by mouth daily as needed.   Yes [provider]  Omega 3 1200 MG CAPS Take 2 capsules by mouth daily. 11/17/10  Yes [provider]  sertraline (ZOLOFT) 50 MG tablet TAKE 2 TABLETS BY MOUTH  DAILY 10/03/20  Yes Birdie Sons, MD  simvastatin (ZOCOR) 40 MG tablet Take 1 tablet (40 mg total) by mouth at bedtime. 08/29/20  Yes Birdie Sons, MD  traZODone (DESYREL) 50 MG tablet Take 1-2 tablets (50-100 mg total) by mouth at bedtime. 08/29/20  Yes Birdie Sons, MD  Ubiquinol 200 MG CAPS Take by mouth. CoQ10   Yes [provider]  Blood Glucose Monitoring Suppl (ONE TOUCH ULTRA 2) w/Device KIT Use to check sugar daily for type 2 diabetes E11.9 07/10/19   Birdie Sons, MD  cetirizine-pseudoephedrine (ZYRTEC-D) 5-120 MG tablet Take 1 tablet by mouth 2 (two) times daily. Patient taking differently: Take 1 tablet by mouth 2 (two) times daily as needed. 05/05/19   Birdie Sons, MD  clobetasol (TEMOVATE) 0.05 % external solution Apply 1 application topically 2 (two) times daily. 02/23/20   Birdie Sons, MD  glucose blood [facility] ULTRA) test strip Use to check sugar twice daily for type 2 diabetes E11.9 09/01/19   Trinna Post, PA-C  Lancets [facility] ULTRASOFT) lancets Use to check sugar twice daily for type 2 diabetes E11.9 09/01/19   Trinna Post, PA-C  omalizumab [facility] Right) 150 MG/ML prefilled syringe Inject 300 mg into the skin every 14 (fourteen) days. 02/10/20   Valentina Shaggy, MD  Sod Picosulfate-Mag Ox-Cit [facility]) 10-3.5-12 MG-GM -GM/160ML SOLN Take 320 mLs by mouth as directed. 12/12/20   Julia Lame, MD  triamcinolone (KENALOG) 0.1 % Apply 1 application topically 2 (two) times daily. 12/08/19   Birdie Sons, MD    Allergies as of 11/28/2020 - Review Complete 11/09/2020  Allergen Reaction Noted   Niacin and related  06/29/2014   Penicillins Itching 06/29/2014   Tylenol  [acetaminophen]  06/29/2014    Family History  Problem Relation Age of Onset   Heart attack Father         1st MI at age 58    Social History   Socioeconomic History   Marital status: Married    Spouse name: Not on file   Number of children: 3   Years of education: Not on file   Highest education level: Not on file  Occupational History   Occupation: retired    Comment: Physical Therapist  Tobacco Use   Smoking status: Never   Smokeless tobacco: Never  Vaping Use   Vaping Use: Never used  Substance and Sexual Activity   Alcohol use: No    Alcohol/week: 0.0 standard drinks   Drug use: No   Sexual activity: Not on file  Other Topics Concern   Not on file  Social History Narrative   Not on file   Social Determinants of Health   Financial Resource Strain: Not on file  Food Insecurity: Not on file  Transportation Needs: Not on file  Physical Activity: Not on file  Stress: Not on file  Social Connections: Not on file  Intimate Partner Violence: Not on file    Review of Systems: See HPI, otherwise negative ROS  Physical Exam: BP 137/89   Pulse 68   Temp 97.6 F (36.4 C) (Temporal)   Resp 16   Ht _0  (1.549 m)   Wt 71.2 kg   SpO2 99%   BMI 29.66 kg/m  General:   Alert,  pleasant and cooperative in NAD Head:  Normocephalic and atraumatic. Neck:  Supple; no masses or thyromegaly. Lungs:  Clear throughout to auscultation.    Heart:  Regular rate and rhythm. Abdomen:  Soft, nontender and nondistended. Normal bowel sounds, without guarding, and without rebound.   Neurologic:  Alert and  oriented x4;  grossly normal neurologically.  Impression/Plan: [name] is here for an colonoscopy to be performed for heme positive stools  Risks, benefits, limitations, and alternatives regarding  colonoscopy have been reviewed with the patient.  Questions have been answered.  All parties agreeable.   Julia Lame, MD  12/20/2020, 9:48 AM

## 2020-12-20 NOTE — Op Note (Signed)
[facility] Gastroenterology Patient Name: Julia Garner Procedure Date: 12/20/2020 9:47 AM MRN: [id number] Account #: [id number] Date of Birth: 07-20-1950 Admit Type: Outpatient Age: 71 Room: [facility] ENDO ROOM 4 Gender: Female Note Status: Finalized Instrument Name: Colonoscope [id number] Procedure:             Colonoscopy Indications:           Heme positive stool Providers:             Midge Minium MD, MD Referring MD:          Demetrios Isaacs. Sherrie Mustache, MD (Referring MD) Medicines:             Propofol per Anesthesia Complications:         No immediate complications. Procedure:             Pre-Anesthesia Assessment:                        - Prior to the procedure, a History and Physical was                         performed, and patient medications and allergies were                         reviewed. The patient's tolerance of previous                         anesthesia was also reviewed. The risks and benefits                         of the procedure and the sedation options and risks                         were discussed with the patient. All questions were                         answered, and informed consent was obtained. Prior                         Anticoagulants: The patient has taken no previous                         anticoagulant or antiplatelet agents. ASA Grade                         Assessment: II - A patient with mild systemic disease.                         After reviewing the risks and benefits, the patient                         was deemed in satisfactory condition to undergo the                         procedure.                        After obtaining informed consent, the colonoscope was  passed under direct vision. Throughout the procedure,                         the patient's blood pressure, pulse, and oxygen                         saturations were monitored continuously. The                         Colonoscope was  introduced through the anus and                         advanced to the the cecum, identified by appendiceal                         orifice and ileocecal valve. The colonoscopy was                         performed without difficulty. The patient tolerated                         the procedure well. The quality of the bowel                         preparation was excellent. Findings:      The perianal and digital rectal examinations were normal.      A 4 mm polyp was found in the rectum. The polyp was sessile. The polyp       was removed with a cold biopsy forceps. Resection and retrieval were       complete.      Non-bleeding internal hemorrhoids were found during retroflexion. The       hemorrhoids were Grade I (internal hemorrhoids that do not prolapse). Impression:            - One 4 mm polyp in the rectum, removed with a cold                         biopsy forceps. Resected and retrieved.                        - Non-bleeding internal hemorrhoids. Recommendation:        - Discharge patient to home.                        - Resume previous diet.                        - Continue present medications.                        - Await pathology results.                        - If the pathology report reveals adenomatous tissue,                         then repeat the colonoscopy for surveillance in 7                         years.  Procedure Code(s):     --- Professional ---                        [id number], Colonoscopy, flexible; with biopsy, single or                         multiple Diagnosis Code(s):     --- Professional ---                        R19.5, Other fecal abnormalities                        K62.1, Rectal polyp CPT copyright 2019 [employer]. All rights reserved. The codes documented in this report are preliminary and upon coder review may  be revised to meet current compliance requirements. Midge Minium MD, MD 12/20/2020 10:25:06 AM This report has been  signed electronically. Number of Addenda: 0 Note Initiated On: 12/20/2020 9:47 AM Scope Withdrawal Time: 0 hours 13 minutes 27 seconds  Total Procedure Duration: 0 hours 17 minutes 26 seconds  Estimated Blood Loss:  Estimated blood loss: none.      [facility]

## 2020-12-20 NOTE — Transfer of Care (Signed)
Immediate Anesthesia Transfer of Care Note  Patient: Julia Garner  Procedure(s) Performed: COLONOSCOPY WITH PROPOFOL  Patient Location: PACU and Endoscopy Unit  Anesthesia Type:General  Level of Consciousness: drowsy and patient cooperative  Airway & Oxygen Therapy: Patient Spontanous Breathing  Post-op Assessment: Report given to RN and Post -op Vital signs reviewed and stable  Post vital signs: Reviewed and stable  Last Vitals:  Vitals Value Taken Time  BP 102/69 12/20/20 1031  Temp    Pulse 67 12/20/20 1031  Resp 15 12/20/20 1031  SpO2 94 % 12/20/20 1031  Vitals shown include unvalidated device data.  Last Pain:  Vitals:   12/20/20 0930  TempSrc: Temporal  PainSc: 2          Complications: No notable events documented.

## 2020-12-20 NOTE — Anesthesia Postprocedure Evaluation (Signed)
Anesthesia Post Note  Patient: Radio broadcast assistant  Procedure(s) Performed: COLONOSCOPY WITH PROPOFOL  Patient location during evaluation: PACU Anesthesia Type: General Level of consciousness: awake and alert Pain management: pain level controlled Vital Signs Assessment: post-procedure vital signs reviewed and stable Respiratory status: spontaneous breathing, nonlabored ventilation, respiratory function stable and patient connected to nasal cannula oxygen Cardiovascular status: blood pressure returned to baseline and stable Postop Assessment: no apparent nausea or vomiting Anesthetic complications: no   No notable events documented.   Last Vitals:  Vitals:   12/20/20 1040 12/20/20 1100  BP: 122/68 (!) 142/75  Pulse: 72 72  Resp: (!) 21 16  Temp:    SpO2: 98% 98%    Last Pain:  Vitals:   12/20/20 0930  TempSrc: Temporal  PainSc: 2                  Racheal Mathurin M Ladena Jacquez

## 2020-12-20 NOTE — Anesthesia Preprocedure Evaluation (Addendum)
Anesthesia Evaluation  Patient identified by MRN, date of birth, ID band Patient awake    Reviewed: Allergy & Precautions, NPO status , Patient's Chart, lab work & pertinent test results  History of Anesthesia Complications Negative for: history of anesthetic complications  Airway Mallampati: III  TM Distance: <3 FB Neck ROM: Full  Mouth opening: Limited Mouth Opening  Dental   Pulmonary sleep apnea (chose not to get CPAP machine) ,    Pulmonary exam normal        Cardiovascular hypertension, Normal cardiovascular exam+ dysrhythmias Supra Ventricular Tachycardia     Denies cardiac symptoms   Neuro/Psych Depression   seizure 2007, isolated event  Hx of head injury    GI/Hepatic negative GI ROS, Fatty liver    Endo/Other  diabetes, Type 2  Renal/GU negative Renal ROS     Musculoskeletal   Abdominal   Peds  Hematology  (+) Blood dyscrasia ( ITP), anemia ,   Anesthesia Other Findings EKG 05/17/20 Sinus Rhythm  -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   Echo 06/2020 1. Left ventricular ejection fraction, by estimation, is 60 to 65%. The  left ventricle has normal function. Left ventricular endocardial border  not optimally defined to evaluate regional wall motion. There is mild left  ventricular hypertrophy. Left  ventricular diastolic parameters are consistent with Grade I diastolic  dysfunction (impaired relaxation).  2. Right ventricular systolic function is normal. The right ventricular  size is normal. Tricuspid regurgitation signal is inadequate for assessing  PA pressure.  3. The mitral valve is normal in structure. Trivial mitral valve  regurgitation. No evidence of mitral stenosis.  4. The aortic valve was not well visualized. Aortic valve regurgitation  is not visualized. No aortic stenosis is present.   Reproductive/Obstetrics                             Anesthesia Physical Anesthesia Plan  ASA: 3  Anesthesia Plan: General   Post-op Pain Management:    Induction:   PONV Risk Score and Plan:   Airway Management Planned: Natural Airway  Additional Equipment:   Intra-op Plan:   Post-operative Plan:   Informed Consent: I have reviewed the patients History and Physical, chart, labs and discussed the procedure including the risks, benefits and alternatives for the proposed anesthesia with the patient or authorized representative who has indicated his/her understanding and acceptance.       Plan Discussed with: CRNA  Anesthesia Plan Comments:         Anesthesia Quick Evaluation

## 2020-12-21 ENCOUNTER — Encounter: Payer: Self-pay | Admitting: Gastroenterology

## 2020-12-21 LAB — SURGICAL PATHOLOGY

## 2020-12-22 ENCOUNTER — Encounter: Payer: Self-pay | Admitting: Gastroenterology

## 2021-01-04 ENCOUNTER — Other Ambulatory Visit: Payer: Self-pay | Admitting: *Deleted

## 2021-01-04 DIAGNOSIS — D693 Immune thrombocytopenic purpura: Secondary | ICD-10-CM

## 2021-01-11 ENCOUNTER — Other Ambulatory Visit: Payer: Self-pay | Admitting: *Deleted

## 2021-01-11 ENCOUNTER — Inpatient Hospital Stay: Payer: Medicare Other | Attending: Oncology

## 2021-01-11 ENCOUNTER — Encounter: Payer: Self-pay | Admitting: Oncology

## 2021-01-11 ENCOUNTER — Inpatient Hospital Stay (HOSPITAL_BASED_OUTPATIENT_CLINIC_OR_DEPARTMENT_OTHER): Payer: Medicare Other | Admitting: Oncology

## 2021-01-11 ENCOUNTER — Other Ambulatory Visit: Payer: Self-pay

## 2021-01-11 VITALS — BP 156/84 | HR 70 | Temp 97.8°F | Resp 16 | Ht 61.0 in | Wt 153.8 lb

## 2021-01-11 DIAGNOSIS — I10 Essential (primary) hypertension: Secondary | ICD-10-CM | POA: Diagnosis not present

## 2021-01-11 DIAGNOSIS — D693 Immune thrombocytopenic purpura: Secondary | ICD-10-CM | POA: Insufficient documentation

## 2021-01-11 DIAGNOSIS — D6959 Other secondary thrombocytopenia: Secondary | ICD-10-CM | POA: Insufficient documentation

## 2021-01-11 DIAGNOSIS — R682 Dry mouth, unspecified: Secondary | ICD-10-CM

## 2021-01-11 LAB — COMPREHENSIVE METABOLIC PANEL
ALT: 23 U/L (ref 0–44)
AST: 17 U/L (ref 15–41)
Albumin: 3.7 g/dL (ref 3.5–5.0)
Alkaline Phosphatase: 52 U/L (ref 38–126)
Anion gap: 10 (ref 5–15)
BUN: 10 mg/dL (ref 8–23)
CO2: 27 mmol/L (ref 22–32)
Calcium: 9.2 mg/dL (ref 8.9–10.3)
Chloride: 102 mmol/L (ref 98–111)
Creatinine, Ser: 0.67 mg/dL (ref 0.44–1.00)
GFR, Estimated: >60 mL/min (ref >60)
Glucose, Bld: 98 mg/dL (ref 70–99)
Potassium: 3.9 mmol/L (ref 3.5–5.1)
Sodium: 139 mmol/L (ref 135–145)
Total Bilirubin: 0.6 mg/dL (ref 0.3–1.2)
Total Protein: 7.5 g/dL (ref 6.5–8.1)

## 2021-01-11 LAB — CBC WITH DIFFERENTIAL/PLATELET
Abs Immature Granulocytes: 0.02 10*3/uL (ref 0.00–0.07)
Basophils Absolute: 0 10*3/uL (ref 0.0–0.1)
Basophils Relative: 0 %
Eosinophils Absolute: 0.4 10*3/uL (ref 0.0–0.5)
Eosinophils Relative: 5 %
HCT: 42.6 % (ref 36.0–46.0)
Hemoglobin: 13.9 g/dL (ref 12.0–15.0)
Immature Granulocytes: 0 %
Lymphocytes Relative: 42 %
Lymphs Abs: 3.8 10*3/uL (ref 0.7–4.0)
MCH: 28.9 pg (ref 26.0–34.0)
MCHC: 32.6 g/dL (ref 30.0–36.0)
MCV: 88.6 fL (ref 80.0–100.0)
Monocytes Absolute: 0.5 10*3/uL (ref 0.1–1.0)
Monocytes Relative: 6 %
Neutro Abs: 4.1 10*3/uL (ref 1.7–7.7)
Neutrophils Relative %: 47 %
Platelets: 107 10*3/uL — ABNORMAL LOW (ref 150–400)
RBC: 4.81 MIL/uL (ref 3.87–5.11)
RDW: 12.8 % (ref 11.5–15.5)
WBC: 8.9 10*3/uL (ref 4.0–10.5)
nRBC: 0 % (ref 0.0–0.2)

## 2021-01-11 MED ORDER — OMALIZUMAB 150 MG/ML ~~LOC~~ SOSY: 300.0000 mg | PREFILLED_SYRINGE | SUBCUTANEOUS | 3 refills | Status: DC

## 2021-01-11 NOTE — Progress Notes (Signed)
Hematology/Oncology Follow up visit [facility] Telephone:([phone] [phone] Patient Care Team: Birdie Sons, MD as PCP - General (Family Medicine) Anell Barr, OD (Optometry)  REASON FOR VISIT Follow up for treatment of thrombocytopenia  PERTINENT HEMATOLOGY HISTORY Julia Garner 70 y.o.  female who has a history of recurrent ITP. Before she was referred to me, she has a long history of ITP which were treated with sterids by primary care physician and she used to respond to steroids. She was seen by anther Hematologist Dr.Pandit many years ago. Dr.Pandit has left [place].  She had labs doneon 08/22/2016. Platelet counts were slow at 15,000. She was treated with a short course (5 days) of Steroids by Dr.Fisher and platelet recovered to 120,000, however later dropped again to 11,000 on 09/10/2016.  I treated her with a longer course of prednisone with slow tapering and her counts again responded to treatment. During the tapering the course,she visited home country [place] and had blood work done there after she finished the tapering course of Prednisone there. She reports the platelet counts were normal in [place].   Patient has had lab work up including negative hepatitis, HIV, normal LDH, normal B12 and folate level. ANA was positive and she was evaluated by rheumatologist. Based on my phone discussion with Rheumatologist, patient has had rheumatology work up and was not considered to have any autoimmune problems. She does not have hepatosplenomegaly.   Patient returned to clinic in December and repeat blood work on 12/26/2016 showed platelet count of 16,000. Treated with a course of Dexamethasone 71m x 4 days, platelet again responded and dropped again.  And she reports easy bruising, feeling tired.  Peripheral blood flowcytometry 1% of analyzed cells containing clonal B cell population which are CD5- and CD 10-.  There is mildly increased  eosinophils 7%.   Patient does not have any palpable lymphadenopathy on physical examination and we obtained CT which showed no pathological lymphadenopathy. Bone marrow biopsy showed hypercellular marrow with trilineage hematopoiesis including increased megakaryocytes. No evidence of lymphoproliferative process involvement. Normal cytogenetics.  At this point patient has had extensive work up for her thrombocytopenia, and we discussed in lengthy that she most likely have ITP, there maybe a low grade lymphoma too. Given that she has recurrent ITP and platelet counts cannot be maintained without steroids, I suggest Rituximab weekly x 4 for consolidation. Patient has requested me to talk to her son in law Dr.Kamron who is a nephrologist in [place] I have talked to Dr.Kamron about the rationale of using Rituximab and also the side effects profile. Dr.Kamron agrees the plan and has relayed information to patient. I have also discussed the side effects of Rituximab with patient. She agrees with treatment.   # severe bilateral carpal tunnel syndrome and received joint steroid injections.  INTERVAL HISTORY Julia Heenanis a 70y.o. female who has above hematology history reviewed by me today presents for follow up visit for ITP.  Patient reports feeling well today. She had " stomach flu" few weeks ago.  Currently of her symptoms have improved. She drinks a lot of water due to dry mouth and she is concerned about her electrolyte level. No acute bleeding events.  ROS:  Review of Systems  Constitutional:  Negative for appetite change, chills, fatigue and fever.  HENT:   Negative for hearing loss and voice change.        Dry mouth  Eyes:  Negative for eye problems.  Respiratory:  Negative for chest tightness and cough.   Cardiovascular:  Negative for chest pain.  Gastrointestinal:  Negative for abdominal distention, abdominal pain and blood in stool.  Endocrine: Negative for hot flashes.   Genitourinary:  Negative for difficulty urinating and frequency.   Musculoskeletal:  Negative for arthralgias.       Bilateral carpal tunnel syndrome  Skin:  Negative for itching and rash.  Neurological:  Negative for extremity weakness.  Hematological:  Negative for adenopathy.  Psychiatric/Behavioral:  Negative for confusion.    MEDICAL HISTORY:  Past Medical History:  Diagnosis Date   Allergy    Chronic ITP (idiopathic thrombocytopenia) (HCC)    Chronic urticaria 08/15/2018   Depression    Head injuries    History of seizure 2007   one seizure    Hypertension     SURGICAL HISTORY: Past Surgical History:  Procedure Laterality Date   CATARACT EXTRACTION, BILATERAL  around 2021   Dr. Ellin Mayhew   CESAREAN SECTION  1990   CHOLECYSTECTOMY  2005   COLONOSCOPY WITH PROPOFOL N/A 12/20/2020   Procedure: COLONOSCOPY WITH PROPOFOL;  Surgeon: Lucilla Lame, MD;  Location: [facility] ENDOSCOPY;  Service: Endoscopy;  Laterality: N/A;   CYST EXCISION  1996   Tracheal Cyst    SOCIAL HISTORY: Social History   Socioeconomic History   Marital status: Married    Spouse name: Not on file   Number of children: 3   Years of education: Not on file   Highest education level: Not on file  Occupational History   Occupation: retired    Comment: Physical Therapist  Tobacco Use   Smoking status: Never   Smokeless tobacco: Never  Vaping Use   Vaping Use: Never used  Substance and Sexual Activity   Alcohol use: No    Alcohol/week: 0.0 standard drinks   Drug use: No   Sexual activity: Not on file  Other Topics Concern   Not on file  Social History Narrative   Not on file   Social Determinants of Health   Financial Resource Strain: Not on file  Food Insecurity: Not on file  Transportation Needs: Not on file  Physical Activity: Not on file  Stress: Not on file  Social Connections: Not on file  Intimate Partner Violence: Not on file    FAMILY HISTORY: Family History  Problem Relation  Age of Onset   Heart attack Father        1st MI at age 51    ALLERGIES:  is allergic to niacin and related, penicillins, and tylenol  [acetaminophen].  MEDICATIONS:  Current Outpatient Medications  Medication Sig Dispense Refill   Bioflavonoid Products (VITAMIN C PLUS) 500 MG TABS Take 2 tablets by mouth daily.      Blood Glucose Monitoring Suppl (ONE TOUCH ULTRA 2) w/Device KIT Use to check sugar daily for type 2 diabetes E11.9 1 kit 0   Calcium Carbonate-Vit D-Min (CALCIUM 1200 PO) Take by mouth.      cetirizine (ZYRTEC) 10 MG tablet Take 20 mg by mouth at bedtime as needed.      cetirizine-pseudoephedrine (ZYRTEC-D) 5-120 MG tablet Take 1 tablet by mouth 2 (two) times daily. (Patient taking differently: Take 1 tablet by mouth 2 (two) times daily as needed.) 60 tablet 5   clobetasol (TEMOVATE) 0.05 % external solution Apply 1 application topically 2 (two) times daily. 50 mL 6   famotidine (PEPCID) 40 MG tablet Take 40 mg by mouth daily as needed.      fexofenadine (ALLEGRA)  180 MG tablet Take 180 mg by mouth daily as needed.      glucosamine-chondroitin 500-400 MG tablet Take 1 tablet by mouth 3 (three) times daily.     glucose blood (ONETOUCH ULTRA) test strip Use to check sugar twice daily for type 2 diabetes E11.9 100 each 4   irbesartan (AVAPRO) 75 MG tablet Take 1 tablet (75 mg total) by mouth daily. 90 tablet 1   Lancets (ONETOUCH ULTRASOFT) lancets Use to check sugar twice daily for type 2 diabetes E11.9 100 each 4   metFORMIN (GLUCOPHAGE-XR) 500 MG 24 hr tablet Take 1 tablet (500 mg total) by mouth daily with breakfast. 90 tablet 1   Multiple Vitamin (MULTIVITAMIN) tablet Take 1 tablet by mouth daily.     Naproxen Sodium 220 MG CAPS Take by mouth daily as needed.     omalizumab Arvid Right) 150 MG/ML prefilled syringe Inject 300 mg into the skin every 14 (fourteen) days. 12 mL 3   Omega 3 1200 MG CAPS Take 2 capsules by mouth daily.     sertraline (ZOLOFT) 50 MG tablet TAKE 2  TABLETS BY MOUTH  DAILY 180 tablet 4   simvastatin (ZOCOR) 40 MG tablet Take 1 tablet (40 mg total) by mouth at bedtime. 90 tablet 0   traZODone (DESYREL) 50 MG tablet Take 1-2 tablets (50-100 mg total) by mouth at bedtime. 180 tablet 3   triamcinolone (KENALOG) 0.1 % Apply 1 application topically 2 (two) times daily. 80 g 4   Ubiquinol 200 MG CAPS Take by mouth. CoQ10     Current Facility-Administered Medications  Medication Dose Route Frequency Provider Last Rate Last Admin   omalizumab Arvid Right) injection 300 mg  300 mg Subcutaneous Q28 days Valentina Shaggy, MD   300 mg at 09/29/19 1523      .  PHYSICAL EXAMINATION: ECOG PERFORMANCE STATUS: 0 - Asymptomatic Vitals:   01/11/21 1441  BP: (!) 156/84  Pulse: 70  Resp: 16  Temp: 97.8 F (36.6 C)  SpO2: 99%   Filed Weights   01/11/21 1441  Weight: 153 lb 12.8 oz (69.8 kg)  Physical Exam Constitutional:      General: She is not in acute distress.    Appearance: She is not diaphoretic.  HENT:     Head: Normocephalic and atraumatic.     Nose: Nose normal.     Mouth/Throat:     Pharynx: No oropharyngeal exudate.  Eyes:     General: No scleral icterus.       Left eye: No discharge.     Conjunctiva/sclera: Conjunctivae normal.     Pupils: Pupils are equal, round, and reactive to light.  Neck:     Vascular: No JVD.  Cardiovascular:     Rate and Rhythm: Normal rate and regular rhythm.     Heart sounds: Normal heart sounds. No murmur heard. Pulmonary:     Effort: Pulmonary effort is normal. No respiratory distress.     Breath sounds: Normal breath sounds. No wheezing or rales.  Chest:     Chest wall: No tenderness.  Abdominal:     General: Bowel sounds are normal. There is no distension.     Palpations: Abdomen is soft. There is no mass.     Tenderness: There is no abdominal tenderness. There is no rebound.  Musculoskeletal:        General: No tenderness. Normal range of motion.     Cervical back: Normal range of  motion and neck supple.  Lymphadenopathy:  Cervical: No cervical adenopathy.  Skin:    General: Skin is dry.     Findings: No rash.  Neurological:     Mental Status: She is alert and oriented to person, place, and time.     Cranial Nerves: No cranial nerve deficit.     Motor: No abnormal muscle tone.     Coordination: Coordination normal.  Psychiatric:        Mood and Affect: Affect normal.        Cognition and Memory: Memory normal.        Judgment: Judgment normal.         LABORATORY DATA:  I have reviewed the data as listed Lab Results  Component Value Date   WBC 8.9 01/11/2021   HGB 13.9 01/11/2021   HCT 42.6 01/11/2021   MCV 88.6 01/11/2021   PLT 107 (L) 01/11/2021   Recent Labs    04/28/20 0000 05/09/20 1319 07/13/20 1256 01/11/21 1429  NA 139 146* 140 139  K 3.5 3.9 4.0 3.9  CL 100 105 104 102  CO2 24* 24 29 27   GLUCOSE  --  112* 94 98  BUN 12 11 12 10   CREATININE 0.9 0.61 0.70 0.67  CALCIUM 9.1 9.3 9.5 9.2  GFRNONAA 72  --  >60 >60  PROT  --  7.0 7.7 7.5  ALBUMIN 4.0 3.9 3.9 3.7  AST  --  15 20 17   ALT  --  20 21 23   ALKPHOS  --  55 56 52  BILITOT  --  0.3 0.8 0.6      RADIOGRAPHIC STUDIES: I have personally reviewed the radiological images as listed and agreed with the findings in the report.   ultrasound of the abdomen showed  no spleenmegaly. Fatty liver disease.  01/17/2017 CT chest abdomen pelvis with contrast showed no pathological adenopathy was identified, aortic atherosclerosis, three-vessel coronary artery atherosclerotic calcification. Cholecystectomy, small bladder cystocele due to pelvic floor laxity. Prominent stool throughout the colon favors constipation.   ASSESSMENT & PLAN:  1. Chronic ITP (idiopathic thrombocytopenia) (HCC)   2. Dry mouth    Thrombocytopenia, secondary to chronic ITP, status post rituximab treatments Patient is doing very well clinically. Labs are reviewed and discussed with patient. Platelet count has  slightly decreased to 107,000.  This could be transiently decreased due to recent infection. No intervention needed.  Continue observation.  Repeat CBC in 4 weeks If stable, patient will follow-up in 6 months.  #Dry mouth, recommend patient to breathe through her nose inside of mouth. She may use over-the-counter dry mouth lemon drops If symptoms are persistent.  Recommend patient to further discuss with primary care provider and her rheumatologist for further evaluation.    All questions were answered. follow-up lab MD in 6 months.    01/11/2021

## 2021-01-15 DIAGNOSIS — U071 COVID-19: Secondary | ICD-10-CM

## 2021-01-15 HISTORY — DX: COVID-19: U07.1

## 2021-01-23 ENCOUNTER — Encounter: Payer: Self-pay | Admitting: Oncology

## 2021-01-25 ENCOUNTER — Telehealth: Payer: Self-pay | Admitting: Oncology

## 2021-01-25 NOTE — Telephone Encounter (Signed)
Pt called to set up appt for lab work. Call back at [phone]

## 2021-01-25 NOTE — Telephone Encounter (Signed)
Pt already scheduled per last check out note. Left VM leaving appointment details.

## 2021-02-08 ENCOUNTER — Inpatient Hospital Stay: Payer: Medicare Other | Attending: Oncology

## 2021-02-08 ENCOUNTER — Other Ambulatory Visit: Payer: Self-pay

## 2021-02-08 DIAGNOSIS — D693 Immune thrombocytopenic purpura: Secondary | ICD-10-CM | POA: Diagnosis present

## 2021-02-08 LAB — CBC WITH DIFFERENTIAL/PLATELET
Abs Immature Granulocytes: 0.02 10*3/uL (ref 0.00–0.07)
Basophils Absolute: 0 10*3/uL (ref 0.0–0.1)
Basophils Relative: 1 %
Eosinophils Absolute: 0.4 10*3/uL (ref 0.0–0.5)
Eosinophils Relative: 5 %
HCT: 42.5 % (ref 36.0–46.0)
Hemoglobin: 14.1 g/dL (ref 12.0–15.0)
Immature Granulocytes: 0 %
Lymphocytes Relative: 45 %
Lymphs Abs: 3.7 10*3/uL (ref 0.7–4.0)
MCH: 29.1 pg (ref 26.0–34.0)
MCHC: 33.2 g/dL (ref 30.0–36.0)
MCV: 87.6 fL (ref 80.0–100.0)
Monocytes Absolute: 0.5 10*3/uL (ref 0.1–1.0)
Monocytes Relative: 6 %
Neutro Abs: 3.5 10*3/uL (ref 1.7–7.7)
Neutrophils Relative %: 43 %
Platelets: 43 10*3/uL — ABNORMAL LOW (ref 150–400)
RBC: 4.85 MIL/uL (ref 3.87–5.11)
RDW: 13 % (ref 11.5–15.5)
WBC: 8.1 10*3/uL (ref 4.0–10.5)
nRBC: 0 % (ref 0.0–0.2)

## 2021-02-14 ENCOUNTER — Other Ambulatory Visit: Payer: Self-pay | Admitting: Family Medicine

## 2021-02-14 ENCOUNTER — Telehealth: Payer: Self-pay

## 2021-02-14 DIAGNOSIS — D693 Immune thrombocytopenic purpura: Secondary | ICD-10-CM

## 2021-02-14 DIAGNOSIS — E119 Type 2 diabetes mellitus without complications: Secondary | ICD-10-CM

## 2021-02-14 NOTE — Telephone Encounter (Signed)
-----   Message from Rickard Patience, MD sent at 02/13/2021 11:46 PM EST ----- Platelet count dropped to 40s. Please arrange her to repeat cbc, and tech smear next week.

## 2021-02-14 NOTE — Telephone Encounter (Signed)
Pt informed via mychart. Orders entered.   Please call pt to set up lab appt next week, per her availability.

## 2021-02-14 NOTE — Telephone Encounter (Signed)
Requested medications are due for refill today.  yes  Requested medications are on the active medications list.  yes  Last refill. 08/29/2020  Future visit scheduled.   yes  Notes to clinic.  Pharmacy requesting a 1 year supply.    Requested Prescriptions  Pending Prescriptions Disp Refills   metFORMIN (GLUCOPHAGE-XR) 500 MG 24 hr tablet [Pharmacy Med Name: metFORMIN HCl ER 500 MG Oral Tablet Extended Release 24 Hour] 90 tablet 3    Sig: TAKE 1 TABLET BY MOUTH  DAILY WITH BREAKFAST     Endocrinology:  Diabetes - Biguanides Passed - 02/14/2021  6:23 AM      Passed - Cr in normal range and within 360 days    Creatinine, Ser  Date Value Ref Range Status  01/11/2021 0.67 0.44 - 1.00 mg/dL Final          Passed - HBA1C is between 0 and 7.9 and within 180 days    Hemoglobin A1C  Date Value Ref Range Status  11/09/2020 6.1 (A) 4.0 - 5.6 % Final   Hgb A1c MFr Bld  Date Value Ref Range Status  11/11/2018 6.5 (H) 4.8 - 5.6 % Final    Comment:             Prediabetes: 5.7 - 6.4          Diabetes: >6.4          Glycemic control for adults with diabetes: <7.0           Passed - eGFR in normal range and within 360 days    GFR calc Af Amer  Date Value Ref Range Status  10/26/2019 90 >59 mL/min/1.73 Final    Comment:    **[facility] currently reports eGFR in compliance with the current**   recommendations of the [employer]. [facility] will   update reporting as new guidelines are published from the NKF-ASN   Task force.    GFR, Estimated  Date Value Ref Range Status  01/11/2021 >60 >60 mL/min Final    Comment:    (NOTE) Calculated using the CKD-EPI Creatinine Equation (2021)    eGFR  Date Value Ref Range Status  05/09/2020 98 >59 mL/min/1.73 Final          Passed - Valid encounter within last 6 months    Recent Outpatient Visits           3 months ago Need for influenza vaccination   [facility] Birdie Sons, MD   7 months ago SVT  (supraventricular tachycardia) [facility])   [facility] Birdie Sons, MD   9 months ago [place], Donald E, MD   9 months ago Diabetes mellitus without complication [facility])   [facility] Birdie Sons, MD   1 year ago Diabetes mellitus without complication [facility])   [facility] Birdie Sons, MD       Future Appointments             In 4 weeks Fisher, Kirstie Peri, MD [facility] [place], [place]

## 2021-02-22 ENCOUNTER — Other Ambulatory Visit: Payer: Self-pay

## 2021-02-22 ENCOUNTER — Inpatient Hospital Stay: Payer: Medicare Other | Attending: Oncology

## 2021-02-22 DIAGNOSIS — D693 Immune thrombocytopenic purpura: Secondary | ICD-10-CM | POA: Diagnosis present

## 2021-02-22 LAB — CBC WITH DIFFERENTIAL/PLATELET
Abs Immature Granulocytes: 0.02 10*3/uL (ref 0.00–0.07)
Basophils Absolute: 0.1 10*3/uL (ref 0.0–0.1)
Basophils Relative: 1 %
Eosinophils Absolute: 0.4 10*3/uL (ref 0.0–0.5)
Eosinophils Relative: 5 %
HCT: 41.6 % (ref 36.0–46.0)
Hemoglobin: 13.7 g/dL (ref 12.0–15.0)
Immature Granulocytes: 0 %
Lymphocytes Relative: 45 %
Lymphs Abs: 4 10*3/uL (ref 0.7–4.0)
MCH: 28.5 pg (ref 26.0–34.0)
MCHC: 32.9 g/dL (ref 30.0–36.0)
MCV: 86.7 fL (ref 80.0–100.0)
Monocytes Absolute: 0.7 10*3/uL (ref 0.1–1.0)
Monocytes Relative: 9 %
Neutro Abs: 3.5 10*3/uL (ref 1.7–7.7)
Neutrophils Relative %: 40 %
Platelets: 60 10*3/uL — ABNORMAL LOW (ref 150–400)
RBC: 4.8 MIL/uL (ref 3.87–5.11)
RDW: 13.2 % (ref 11.5–15.5)
WBC: 8.7 10*3/uL (ref 4.0–10.5)
nRBC: 0 % (ref 0.0–0.2)

## 2021-02-22 LAB — TECHNOLOGIST SMEAR REVIEW: Plt Morphology: DECREASED

## 2021-03-01 ENCOUNTER — Telehealth: Payer: Self-pay

## 2021-03-01 DIAGNOSIS — I700 Atherosclerosis of aorta: Secondary | ICD-10-CM

## 2021-03-01 DIAGNOSIS — I471 Supraventricular tachycardia, unspecified: Secondary | ICD-10-CM

## 2021-03-01 DIAGNOSIS — D693 Immune thrombocytopenic purpura: Secondary | ICD-10-CM

## 2021-03-01 NOTE — Telephone Encounter (Signed)
-----   Message from Rickard Patience, MD sent at 02/28/2021 10:55 PM EST ----- Platelet is better than last time. Please arrange her to repeat cbc in 2 months.

## 2021-03-01 NOTE — Telephone Encounter (Signed)
Julia Garner, please schedule patient repeat labs in 2 months. Please notify the patient of appointment.Thanks.

## 2021-03-01 NOTE — Telephone Encounter (Signed)
Mychart message sent to inform patient of repeat labs. Please send mychart to notify of appt.

## 2021-03-14 ENCOUNTER — Encounter: Payer: Self-pay | Admitting: Family Medicine

## 2021-03-14 ENCOUNTER — Ambulatory Visit (INDEPENDENT_AMBULATORY_CARE_PROVIDER_SITE_OTHER): Payer: Medicare Other | Admitting: Family Medicine

## 2021-03-14 ENCOUNTER — Other Ambulatory Visit: Payer: Self-pay

## 2021-03-14 VITALS — BP 121/71 | HR 72 | Temp 98.2°F | Resp 14 | Ht 61.0 in | Wt 157.7 lb

## 2021-03-14 DIAGNOSIS — J301 Allergic rhinitis due to pollen: Secondary | ICD-10-CM

## 2021-03-14 DIAGNOSIS — D693 Immune thrombocytopenic purpura: Secondary | ICD-10-CM | POA: Diagnosis not present

## 2021-03-14 DIAGNOSIS — L508 Other urticaria: Secondary | ICD-10-CM

## 2021-03-14 DIAGNOSIS — I10 Essential (primary) hypertension: Secondary | ICD-10-CM

## 2021-03-14 DIAGNOSIS — E119 Type 2 diabetes mellitus without complications: Secondary | ICD-10-CM

## 2021-03-14 NOTE — Progress Notes (Signed)
Established patient visit   Patient: Julia Garner   DOB: [birth year]   71 y.o. Female  MRN: [id number] Visit Date: 03/14/2021  Today's healthcare provider: Lelon Huh, MD   No chief complaint on file.  Subjective    HPI  Diabetes Mellitus Type II, Follow-up  Lab Results  Component Value Date   HGBA1C 6.1 (A) 11/09/2020   HGBA1C 6.4 (A) 04/29/2020   HGBA1C 6.1 (A) 10/26/2019   Wt Readings from Last 3 Encounters:  01/11/21 153 lb 12.8 oz (69.8 kg)  12/20/20 157 lb (71.2 kg)  11/09/20 160 lb (72.6 kg)   Last seen for diabetes 4 months ago.  Management since then includes; Well controlled. Continue current medications. She reports good compliance with treatment. She is not having side effects. None  Home blood sugar records: fasting range: around 114  Episodes of hypoglycemia? No    Current insulin regiment: none Most Recent Eye Exam: 04/30/2020 Current exercise: walking Current diet habits: in general, a "healthy" diet    Pertinent Labs: Lab Results  Component Value Date   CHOL 134 10/26/2019   HDL 31 (L) 10/26/2019   LDLCALC 67 10/26/2019   TRIG 218 (H) 10/26/2019   CHOLHDL 4.3 10/26/2019   Lab Results  Component Value Date   NA 139 01/11/2021   K 3.9 01/11/2021   CREATININE 0.67 01/11/2021   GFRNONAA >60 01/11/2021     --------------------------------------------------------------------------------------------------- Hypertension, follow-up  BP Readings from Last 3 Encounters:  01/11/21 (!) 156/84  12/20/20 (!) 142/75  11/09/20 (!) 106/59   Wt Readings from Last 3 Encounters:  01/11/21 153 lb 12.8 oz (69.8 kg)  12/20/20 157 lb (71.2 kg)  11/09/20 160 lb (72.6 kg)     She was last seen for hypertension 5 months ago.  BP at that visit was 106/59. Management since that visit includes; Change to irbesartan (AVAPRO) 75 MG tablet; Take 1 tablet (75 mg total) by mouth daily.  She reports good compliance with treatment. She is not having  side effects. None She is following a Regular diet. She is not exercising. She does not smoke.   Outside blood pressures are Not checked.     ---------------------------------------------------------------------------------------------------   Medications: Outpatient Medications Prior to Visit  Medication Sig   Bioflavonoid Products (VITAMIN C PLUS) 500 MG TABS Take 2 tablets by mouth daily.    Blood Glucose Monitoring Suppl (ONE TOUCH ULTRA 2) w/Device KIT Use to check sugar daily for type 2 diabetes E11.9   Calcium Carbonate-Vit D-Min (CALCIUM 1200 PO) Take by mouth.    cetirizine (ZYRTEC) 10 MG tablet Take 20 mg by mouth at bedtime as needed.    cetirizine-pseudoephedrine (ZYRTEC-D) 5-120 MG tablet Take 1 tablet by mouth 2 (two) times daily. (Patient taking differently: Take 1 tablet by mouth 2 (two) times daily as needed.)   clobetasol (TEMOVATE) 0.05 % external solution Apply 1 application topically 2 (two) times daily.   famotidine (PEPCID) 40 MG tablet Take 40 mg by mouth daily as needed.    fexofenadine (ALLEGRA) 180 MG tablet Take 180 mg by mouth daily as needed.    glucosamine-chondroitin 500-400 MG tablet Take 1 tablet by mouth 3 (three) times daily.   glucose blood (ONETOUCH ULTRA) test strip Use to check sugar twice daily for type 2 diabetes E11.9   irbesartan (AVAPRO) 75 MG tablet Take 1 tablet (75 mg total) by mouth daily.   Lancets (ONETOUCH ULTRASOFT) lancets Use to check sugar twice daily for type 2  diabetes E11.9   metFORMIN (GLUCOPHAGE-XR) 500 MG 24 hr tablet TAKE 1 TABLET BY MOUTH  DAILY WITH BREAKFAST   Multiple Vitamin (MULTIVITAMIN) tablet Take 1 tablet by mouth daily.   Naproxen Sodium 220 MG CAPS Take by mouth daily as needed.   omalizumab Arvid Right) 150 MG/ML prefilled syringe Inject 300 mg into the skin every 14 (fourteen) days.   Omega 3 1200 MG CAPS Take 2 capsules by mouth daily.   sertraline (ZOLOFT) 50 MG tablet TAKE 2 TABLETS BY MOUTH  DAILY    simvastatin (ZOCOR) 40 MG tablet Take 1 tablet (40 mg total) by mouth at bedtime.   traZODone (DESYREL) 50 MG tablet Take 1-2 tablets (50-100 mg total) by mouth at bedtime.   triamcinolone (KENALOG) 0.1 % Apply 1 application topically 2 (two) times daily.   Ubiquinol 200 MG CAPS Take by mouth. CoQ10   Facility-Administered Medications Prior to Visit  Medication Dose Route Frequency Provider   omalizumab Arvid Right) injection 300 mg  300 mg Subcutaneous Q28 days Valentina Shaggy, MD        Objective    BP 121/71 (BP Location: Left Arm, Patient Position: Sitting, Cuff Size: Normal)    Pulse 72    Temp 98.2 F (36.8 C) (Oral)    Resp 14    Ht _0  (1.549 m)    Wt 157 lb 11.2 oz (71.5 kg)    SpO2 99%    BMI 29.80 kg/m    Physical Exam   General: Appearance:     Well developed, well nourished female in no acute distress  Eyes:    PERRL, conjunctiva/corneas clear, EOM's intact       Lungs:     Clear to auscultation bilaterally, respirations unlabored  Heart:    Normal heart rate. Normal rhythm. No murmurs, rubs, or gallops.    MS:   All extremities are intact.    Neurologic:   Awake, alert, oriented x 3. No apparent focal neurological defect.         Assessment & Plan     1. Chronic ITP (idiopathic thrombocytopenia) (HCC)  - CBC  2. Essential (primary) hypertension Well controlled.  Continue current medications.   - Comprehensive metabolic panel - Lipid panel  3. Diabetes mellitus without complication ([place])  - Hemoglobin A1c  4. Chronic urticaria stable  5. Allergic rhinitis  She states her sinuses around her eyes stay congestion with pressure all the time unless she takes an antihistamine, but she doesn't tolerating antihistamines or nasal allergy sprays. We discussed referral to ENT, but she only wants to see a really good ENT. She will let me know if she wants to proceed with referral.         The entirety of the information documented in the History of Present  Illness, Review of Systems and Physical Exam were personally obtained by me. Portions of this information were initially documented by the CMA and reviewed by me for thoroughness and accuracy.  n   Lelon Huh, MD  [facility] [phone] (phone) [phone] (fax)  [place]

## 2021-03-15 LAB — COMPREHENSIVE METABOLIC PANEL
ALT: 21 IU/L (ref 0–32)
AST: 20 IU/L (ref 0–40)
Albumin/Globulin Ratio: 1.1 — ABNORMAL LOW (ref 1.2–2.2)
Albumin: 4.1 g/dL (ref 3.8–4.8)
Alkaline Phosphatase: 72 IU/L (ref 44–121)
BUN/Creatinine Ratio: 10 — ABNORMAL LOW (ref 12–28)
BUN: 7 mg/dL — ABNORMAL LOW (ref 8–27)
Bilirubin Total: 0.4 mg/dL (ref 0.0–1.2)
CO2: 25 mmol/L (ref 20–29)
Calcium: 9.3 mg/dL (ref 8.7–10.3)
Chloride: 108 mmol/L — ABNORMAL HIGH (ref 96–106)
Creatinine, Ser: 0.7 mg/dL (ref 0.57–1.00)
Globulin, Total: 3.6 g/dL (ref 1.5–4.5)
Glucose: 115 mg/dL — ABNORMAL HIGH (ref 70–99)
Potassium: 4 mmol/L (ref 3.5–5.2)
Sodium: 141 mmol/L (ref 134–144)
Total Protein: 7.7 g/dL (ref 6.0–8.5)
eGFR: 94 mL/min/{1.73_m2} (ref >59)

## 2021-03-15 LAB — CBC
Hematocrit: 42.1 % (ref 34.0–46.6)
Hemoglobin: 14.1 g/dL (ref 11.1–15.9)
MCH: 29.2 pg (ref 26.6–33.0)
MCHC: 33.5 g/dL (ref 31.5–35.7)
MCV: 87 fL (ref 79–97)
Platelets: 69 10*3/uL — CL (ref 150–450)
RBC: 4.83 x10E6/uL (ref 3.77–5.28)
RDW: 12.8 % (ref 11.7–15.4)
WBC: 8 10*3/uL (ref 3.4–10.8)

## 2021-03-15 LAB — LIPID PANEL
Chol/HDL Ratio: 4.4 ratio (ref 0.0–4.4)
Cholesterol, Total: 127 mg/dL (ref 100–199)
HDL: 29 mg/dL — ABNORMAL LOW (ref >39)
LDL Chol Calc (NIH): 56 mg/dL (ref 0–99)
Triglycerides: 267 mg/dL — ABNORMAL HIGH (ref 0–149)
VLDL Cholesterol Cal: 42 mg/dL — ABNORMAL HIGH (ref 5–40)

## 2021-03-15 LAB — HEMOGLOBIN A1C
Est. average glucose Bld gHb Est-mCnc: 131 mg/dL
Hgb A1c MFr Bld: 6.2 % — ABNORMAL HIGH (ref 4.8–5.6)

## 2021-03-19 ENCOUNTER — Other Ambulatory Visit: Payer: Self-pay | Admitting: Family Medicine

## 2021-03-21 ENCOUNTER — Ambulatory Visit: Payer: Medicare Other | Admitting: Family Medicine

## 2021-04-29 ENCOUNTER — Other Ambulatory Visit: Payer: Self-pay | Admitting: Family Medicine

## 2021-04-29 DIAGNOSIS — E119 Type 2 diabetes mellitus without complications: Secondary | ICD-10-CM

## 2021-05-01 ENCOUNTER — Inpatient Hospital Stay: Payer: Medicare Other | Attending: Oncology

## 2021-05-01 DIAGNOSIS — D693 Immune thrombocytopenic purpura: Secondary | ICD-10-CM | POA: Insufficient documentation

## 2021-05-01 LAB — CBC WITH DIFFERENTIAL/PLATELET
Abs Immature Granulocytes: 0.02 10*3/uL (ref 0.00–0.07)
Basophils Absolute: 0 10*3/uL (ref 0.0–0.1)
Basophils Relative: 1 %
Eosinophils Absolute: 0.4 10*3/uL (ref 0.0–0.5)
Eosinophils Relative: 5 %
HCT: 44.2 % (ref 36.0–46.0)
Hemoglobin: 14.2 g/dL (ref 12.0–15.0)
Immature Granulocytes: 0 %
Lymphocytes Relative: 45 %
Lymphs Abs: 3.9 10*3/uL (ref 0.7–4.0)
MCH: 28.4 pg (ref 26.0–34.0)
MCHC: 32.1 g/dL (ref 30.0–36.0)
MCV: 88.4 fL (ref 80.0–100.0)
Monocytes Absolute: 0.6 10*3/uL (ref 0.1–1.0)
Monocytes Relative: 7 %
Neutro Abs: 3.6 10*3/uL (ref 1.7–7.7)
Neutrophils Relative %: 42 %
Platelets: 108 10*3/uL — ABNORMAL LOW (ref 150–400)
RBC: 5 MIL/uL (ref 3.87–5.11)
RDW: 13.2 % (ref 11.5–15.5)
WBC: 8.6 10*3/uL (ref 4.0–10.5)
nRBC: 0 % (ref 0.0–0.2)

## 2021-05-12 ENCOUNTER — Telehealth: Payer: Self-pay

## 2021-05-12 NOTE — Telephone Encounter (Signed)
Copied from CRM [id number]. Topic: General - Other ?>> May 12, 2021 11:55 AM Pawlus, Maxine Glenn A wrote: ?Reason for CRM: Pt called in regarding Dr Sherrie Mustache no longer being in her network, pt wanted to know why he no longer accepts her health plan. ?

## 2021-05-12 NOTE — Telephone Encounter (Signed)
I spoke with Traci Sermon and she advised that he should still be in network with [facility] insurance plan.  I called pt and left vm letting her know that he should still be in network.

## 2021-05-12 NOTE — Telephone Encounter (Signed)
Patient called back stated that she had a missed call from office please advise and call her at Ph#  [phone] ?

## 2021-05-12 NOTE — Telephone Encounter (Signed)
Tried to call pt again and she did not answer.  If pt calls back it is okay for the [facility] to advise pt that per office manager Dr. Sherrie Mustache should still be in network with [facility] insurance.

## 2021-05-12 NOTE — Telephone Encounter (Signed)
Copied from CRM [id number]. Topic: General - Other ?>> May 12, 2021 12:28 PM Julia Garner wrote: ?Reason for CRM: Pt doesn't want to switch providers but her insurance advised that Dr. Sherrie Garner is no longer accepting [facility] AARP plan 1 / please advise / pt would like to speak to someone about this asap ?

## 2021-05-12 NOTE — Telephone Encounter (Signed)
Pt called and is asking for someone in the office to confirm this with the insurance because she does not want to switch providers, she is requesting a call back once this is completed  ?

## 2021-06-02 ENCOUNTER — Telehealth: Payer: Self-pay | Admitting: *Deleted

## 2021-06-02 ENCOUNTER — Telehealth: Payer: Self-pay | Admitting: Family Medicine

## 2021-06-02 DIAGNOSIS — G4700 Insomnia, unspecified: Secondary | ICD-10-CM

## 2021-06-02 NOTE — Telephone Encounter (Signed)
Patient called stating she is going out of [place] 6[place]17 and she has an appointment 6/28, she would like to move her appointment to be seen before she leaves. Please return her call

## 2021-06-02 NOTE — Telephone Encounter (Signed)
[place] faxed refill request for the following medications:  traZODone (DESYREL) 50 MG tablet   Please advise.

## 2021-06-05 MED ORDER — TRAZODONE HCL 50 MG PO TABS: 50.0000 mg | ORAL_TABLET | Freq: Every day | ORAL | 4 refills | Status: DC

## 2021-06-05 NOTE — Addendum Note (Signed)
Addended by: Malva Limes on: 06/05/2021 02:31 PM   Modules accepted: Orders

## 2021-06-06 ENCOUNTER — Other Ambulatory Visit: Payer: Self-pay | Admitting: Family Medicine

## 2021-06-06 MED ORDER — SIMVASTATIN 40 MG PO TABS: 40.0000 mg | ORAL_TABLET | Freq: Every day | ORAL | 0 refills | Status: DC

## 2021-06-06 NOTE — Telephone Encounter (Signed)
Requested Prescriptions  Pending Prescriptions Disp Refills  . simvastatin (ZOCOR) 40 MG tablet 90 tablet 0    Sig: Take 1 tablet (40 mg total) by mouth at bedtime.     Cardiovascular:  Antilipid - Statins Failed - 06/06/2021 12:41 PM      Failed - Lipid Panel in normal range within the last 12 months    Cholesterol, Total  Date Value Ref Range Status  03/14/2021 127 100 - 199 mg/dL Final   LDL Chol Calc (NIH)  Date Value Ref Range Status  03/14/2021 56 0 - 99 mg/dL Final   HDL  Date Value Ref Range Status  03/14/2021 29 (L) >39 mg/dL Final   Triglycerides  Date Value Ref Range Status  03/14/2021 267 (H) 0 - 149 mg/dL Final         Passed - Patient is not pregnant      Passed - Valid encounter within last 12 months    Recent Outpatient Visits          2 months ago Chronic ITP (idiopathic thrombocytopenia) [facility])   [facility] Malva Limes, MD   6 months ago Need for influenza vaccination   [facility] Malva Limes, MD   11 months ago SVT (supraventricular tachycardia) [facility])   [facility] Malva Limes, MD   1 year ago Palpitations   [facility] Malva Limes, MD   1 year ago Diabetes mellitus without complication [facility])   [facility] Malva Limes, MD

## 2021-06-06 NOTE — Telephone Encounter (Signed)
Medication Refill - Medication:  simvastatin (ZOCOR) 40 MG tablet  Has the patient contacted their pharmacy? Yes.   (Agent: If no, request that the patient contact the pharmacy for the refill. If patient does not wish to contact the pharmacy document the reason why and proceed with request.) (Agent: If yes, when and what did the pharmacy advise?)request sent to office with no response   Preferred Pharmacy (with phone number or street name):  [facility] [place], [place] - [address] Phone:  [phone]  Fax:  [phone]     Has the patient been seen for an appointment in the last year OR does the patient have an upcoming appointment? Yes.    Agent: Please be advised that RX refills may take up to 3 business days. We ask that you follow-up with your pharmacy.

## 2021-06-09 ENCOUNTER — Telehealth: Payer: Self-pay

## 2021-06-09 ENCOUNTER — Other Ambulatory Visit: Payer: Self-pay | Admitting: Family Medicine

## 2021-06-09 NOTE — Telephone Encounter (Signed)
Copied from CRM [id number]. Topic: General - Other >> Jun 09, 2021  1:43 PM Randol Kern wrote: Needs vaccine Rx Reason for CRM: Pt called requesting a Rx for this shot. She needs this for travel. Says it can either be [facility] or she can be scheduled at the office.  Quadrivalent ACY W135 Meningitis Vaccine

## 2021-06-09 NOTE — Telephone Encounter (Signed)
Copied from CRM [id number]. Topic: General - Other >> Jun 09, 2021  1:41 PM Randol Kern wrote: Reason for CRM: Pt called requesting a Rx for this shot. She needs this for travel. Says it can either be [facility] or she can be scheduled at the office.   Quadrivalent ACY W135 Meningitis Vaccine

## 2021-06-10 MED ORDER — MENINGOCOCCAL A C Y&W-135 OLIG IM SOLN: 0.5000 mL | Freq: Once | INTRAMUSCULAR | 0 refills | Status: AC

## 2021-06-27 ENCOUNTER — Inpatient Hospital Stay: Payer: Medicare Other

## 2021-06-27 ENCOUNTER — Other Ambulatory Visit: Payer: Self-pay

## 2021-06-27 ENCOUNTER — Encounter: Payer: Self-pay | Admitting: Oncology

## 2021-06-27 ENCOUNTER — Inpatient Hospital Stay: Payer: Medicare Other | Attending: Oncology | Admitting: Oncology

## 2021-06-27 VITALS — BP 125/71 | HR 76 | Temp 97.9°F | Resp 18 | Wt 155.7 lb

## 2021-06-27 DIAGNOSIS — I10 Essential (primary) hypertension: Secondary | ICD-10-CM | POA: Insufficient documentation

## 2021-06-27 DIAGNOSIS — D696 Thrombocytopenia, unspecified: Secondary | ICD-10-CM | POA: Diagnosis not present

## 2021-06-27 DIAGNOSIS — D693 Immune thrombocytopenic purpura: Secondary | ICD-10-CM

## 2021-06-27 LAB — COMPREHENSIVE METABOLIC PANEL
ALT: 29 U/L (ref 0–44)
AST: 25 U/L (ref 15–41)
Albumin: 3.8 g/dL (ref 3.5–5.0)
Alkaline Phosphatase: 62 U/L (ref 38–126)
Anion gap: 6 (ref 5–15)
BUN: 11 mg/dL (ref 8–23)
CO2: 27 mmol/L (ref 22–32)
Calcium: 8.9 mg/dL (ref 8.9–10.3)
Chloride: 106 mmol/L (ref 98–111)
Creatinine, Ser: 0.81 mg/dL (ref 0.44–1.00)
GFR, Estimated: >60 mL/min (ref >60)
Glucose, Bld: 130 mg/dL — ABNORMAL HIGH (ref 70–99)
Potassium: 3.8 mmol/L (ref 3.5–5.1)
Sodium: 139 mmol/L (ref 135–145)
Total Bilirubin: 0.6 mg/dL (ref 0.3–1.2)
Total Protein: 8.3 g/dL — ABNORMAL HIGH (ref 6.5–8.1)

## 2021-06-27 LAB — CBC WITH DIFFERENTIAL/PLATELET
Abs Immature Granulocytes: 0.05 10*3/uL (ref 0.00–0.07)
Basophils Absolute: 0.1 10*3/uL (ref 0.0–0.1)
Basophils Relative: 1 %
Eosinophils Absolute: 0.6 10*3/uL — ABNORMAL HIGH (ref 0.0–0.5)
Eosinophils Relative: 6 %
HCT: 42.7 % (ref 36.0–46.0)
Hemoglobin: 13.9 g/dL (ref 12.0–15.0)
Immature Granulocytes: 1 %
Lymphocytes Relative: 45 %
Lymphs Abs: 4.5 10*3/uL — ABNORMAL HIGH (ref 0.7–4.0)
MCH: 28.4 pg (ref 26.0–34.0)
MCHC: 32.6 g/dL (ref 30.0–36.0)
MCV: 87.3 fL (ref 80.0–100.0)
Monocytes Absolute: 0.6 10*3/uL (ref 0.1–1.0)
Monocytes Relative: 7 %
Neutro Abs: 3.9 10*3/uL (ref 1.7–7.7)
Neutrophils Relative %: 40 %
Platelets: 159 10*3/uL (ref 150–400)
RBC: 4.89 MIL/uL (ref 3.87–5.11)
RDW: 13.4 % (ref 11.5–15.5)
WBC: 9.7 10*3/uL (ref 4.0–10.5)
nRBC: 0 % (ref 0.0–0.2)

## 2021-06-27 NOTE — Progress Notes (Signed)
Pt here for follow up. No new concerns voiced.   

## 2021-06-27 NOTE — Progress Notes (Signed)
Hematology/Oncology Progress note [phone] [phone]    Patient Care Team: Birdie Sons, MD as PCP - General (Family Medicine) Anell Barr, OD (Optometry)  REASON FOR VISIT Follow up for treatment of thrombocytopenia  ASSESSMENT & PLAN:  Chronic ITP (idiopathic thrombocytopenia) (HCC) Previous has rituximab treatments Patient is doing very well clinically. Labs are reviewed and discussed with patient. Platelet count is normal today.  Repeat CBC in 12 weeks Follow up in 6 months  Orders Placed This Encounter  Procedures   CBC with Differential/Platelet    Standing Status:   Future    Standing Expiration Date:   06/28/2022   CBC with Differential/Platelet    Standing Status:   Future    Standing Expiration Date:   06/28/2022   Comprehensive metabolic panel    Standing Status:   Future    Standing Expiration Date:   06/28/2022    All questions were answered. The patient knows to call the clinic with any problems, questions or concerns.  Julia Server, MD, PhD [facility] Hematology Oncology 06/27/2021     PERTINENT HEMATOLOGY HISTORY Julia Garner 71 y.o.  female who has a history of recurrent ITP. Before she was referred to me, she has a long history of ITP which were treated with sterids by primary care physician and she used to respond to steroids. She was seen by anther Hematologist Dr.Pandit many years ago. Dr.Pandit has left [place].  She had labs doneon 08/22/2016. Platelet counts were slow at 15,000. She was treated with a short course (5 days) of Steroids by Dr.Fisher and platelet recovered to 120,000, however later dropped again to 11,000 on 09/10/2016.  I treated her with a longer course of prednisone with slow tapering and her counts again responded to treatment. During the tapering the course,she visited home country [place] and had blood work done there after she finished the tapering course of Prednisone there. She reports the  platelet counts were normal in [place].   Patient has had lab work up including negative hepatitis, HIV, normal LDH, normal B12 and folate level. ANA was positive and she was evaluated by rheumatologist. Based on my phone discussion with Rheumatologist, patient has had rheumatology work up and was not considered to have any autoimmune problems. She does not have hepatosplenomegaly.   Patient returned to clinic in December and repeat blood work on 12/26/2016 showed platelet count of 16,000. Treated with a course of Dexamethasone 21m x 4 days, platelet again responded and dropped again.  And she reports easy bruising, feeling tired.  Peripheral blood flowcytometry 1% of analyzed cells containing clonal B cell population which are CD5- and CD 10-.  There is mildly increased eosinophils 7%.   Patient does not have any palpable lymphadenopathy on physical examination and we obtained CT which showed no pathological lymphadenopathy. Bone marrow biopsy showed hypercellular marrow with trilineage hematopoiesis including increased megakaryocytes. No evidence of lymphoproliferative process involvement. Normal cytogenetics.  At this point patient has had extensive work up for her thrombocytopenia, and we discussed in lengthy that she most likely have ITP, there maybe a low grade lymphoma too. Given that she has recurrent ITP and platelet counts cannot be maintained without steroids, I suggest Rituximab weekly x 4 for consolidation. Patient has requested me to talk to her son in law Dr.Kamron who is a nephrologist in [place] I have talked to Dr.Kamron about the rationale of using Rituximab and also the side effects profile. Dr.Kamron agrees the plan and has relayed  information to patient. I have also discussed the side effects of Rituximab with patient. She agrees with treatment.   # severe bilateral carpal tunnel syndrome and received joint steroid injections.  INTERVAL HISTORY Julia Garner is a 71 y.o. female  who has above hematology history reviewed by me today presents for follow up visit for ITP.  Patient reports feeling well today.  Denies any active bleeding or easy bruising. She recently received meningitis vaccination.  Denies fever chills upper respiratory symptoms   Review of Systems  Constitutional:  Negative for appetite change, chills, fatigue and fever.  HENT:   Negative for hearing loss and voice change.   Eyes:  Negative for eye problems.  Respiratory:  Negative for chest tightness and cough.   Cardiovascular:  Negative for chest pain.  Gastrointestinal:  Negative for abdominal distention, abdominal pain and blood in stool.  Endocrine: Negative for hot flashes.  Genitourinary:  Negative for difficulty urinating and frequency.   Musculoskeletal:  Negative for arthralgias.  Skin:  Negative for itching and rash.  Neurological:  Negative for extremity weakness.  Hematological:  Negative for adenopathy.  Psychiatric/Behavioral:  Negative for confusion.     MEDICAL HISTORY:  Past Medical History:  Diagnosis Date   Allergy    Chronic ITP (idiopathic thrombocytopenia) (HCC)    Chronic urticaria 08/15/2018   Depression    Head injuries    History of seizure 2007   one seizure    Hypertension     SURGICAL HISTORY: Past Surgical History:  Procedure Laterality Date   CATARACT EXTRACTION, BILATERAL  around 2021   Dr. Ellin Mayhew   CESAREAN SECTION  1990   CHOLECYSTECTOMY  2005   COLONOSCOPY WITH PROPOFOL N/A 12/20/2020   Procedure: COLONOSCOPY WITH PROPOFOL;  Surgeon: Lucilla Lame, MD;  Location: [facility] ENDOSCOPY;  Service: Endoscopy;  Laterality: N/A;   CYST EXCISION  1996   Tracheal Cyst    SOCIAL HISTORY: Social History   Socioeconomic History   Marital status: Married    Spouse name: Not on file   Number of children: 3   Years of education: Not on file   Highest education level: Not on file  Occupational History   Occupation: retired    Comment: Physical Therapist   Tobacco Use   Smoking status: Never   Smokeless tobacco: Never  Vaping Use   Vaping Use: Never used  Substance and Sexual Activity   Alcohol use: No    Alcohol/week: 0.0 standard drinks of alcohol   Drug use: No   Sexual activity: Not on file  Other Topics Concern   Not on file  Social History Narrative   Not on file   Social Determinants of Health   Financial Resource Strain: Not on file  Food Insecurity: Not on file  Transportation Needs: Not on file  Physical Activity: Not on file  Stress: Not on file  Social Connections: Not on file  Intimate Partner Violence: Not on file    FAMILY HISTORY: Family History  Problem Relation Age of Onset   Heart attack Father        1st MI at age 39    ALLERGIES:  is allergic to niacin and related, penicillins, and tylenol  [acetaminophen].  MEDICATIONS:  Current Outpatient Medications  Medication Sig Dispense Refill   Bioflavonoid Products (VITAMIN C PLUS) 500 MG TABS Take 2 tablets by mouth daily.      Blood Glucose Monitoring Suppl (ONE TOUCH ULTRA 2) w/Device KIT Use to check sugar daily for  type 2 diabetes E11.9 1 kit 0   cetirizine (ZYRTEC) 10 MG tablet Take 20 mg by mouth at bedtime as needed.      cetirizine-pseudoephedrine (ZYRTEC-D) 5-120 MG tablet Take 1 tablet by mouth 2 (two) times daily. (Patient taking differently: Take 1 tablet by mouth 2 (two) times daily as needed.) 60 tablet 5   famotidine (PEPCID) 40 MG tablet Take 40 mg by mouth daily as needed.      fexofenadine (ALLEGRA) 180 MG tablet Take 180 mg by mouth daily as needed.      glucosamine-chondroitin 500-400 MG tablet Take 1 tablet by mouth 3 (three) times daily.     glucose blood (ONETOUCH ULTRA) test strip Use to check sugar twice daily for type 2 diabetes E11.9 100 each 4   irbesartan (AVAPRO) 75 MG tablet TAKE 1 TABLET BY MOUTH DAILY 90 tablet 3   Lancets (ONETOUCH ULTRASOFT) lancets Use to check sugar twice daily for type 2 diabetes E11.9 100 each 4    metFORMIN (GLUCOPHAGE-XR) 500 MG 24 hr tablet TAKE 1 TABLET BY MOUTH DAILY  WITH BREAKFAST 100 tablet 2   Multiple Vitamin (MULTIVITAMIN) tablet Take 1 tablet by mouth daily.     Multiple Vitamins-Minerals (ZINC PO) Take by mouth. Patient does not know dosage     Naproxen Sodium 220 MG CAPS Take by mouth daily as needed.     omalizumab Arvid Right) 150 MG/ML prefilled syringe Inject 300 mg into the skin every 14 (fourteen) days. 12 mL 3   Omega 3 1200 MG CAPS Take 2 capsules by mouth daily.     sertraline (ZOLOFT) 50 MG tablet TAKE 2 TABLETS BY MOUTH  DAILY 180 tablet 4   simvastatin (ZOCOR) 40 MG tablet TAKE 1 TABLET BY MOUTH AT  BEDTIME 90 tablet 3   traZODone (DESYREL) 50 MG tablet Take 1-2 tablets (50-100 mg total) by mouth at bedtime. 180 tablet 4   Ubiquinol 200 MG CAPS Take by mouth. CoQ10     Calcium Carbonate-Vit D-Min (CALCIUM 1200 PO) Take by mouth.  (Patient not taking: Reported on 06/27/2021)     clobetasol (TEMOVATE) 0.05 % external solution Apply 1 application topically 2 (two) times daily. (Patient not taking: Reported on 06/27/2021) 50 mL 6   triamcinolone (KENALOG) 0.1 % Apply 1 application topically 2 (two) times daily. (Patient not taking: Reported on 06/27/2021) 80 g 4   Current Facility-Administered Medications  Medication Dose Route Frequency Provider Last Rate Last Admin   omalizumab Arvid Right) injection 300 mg  300 mg Subcutaneous Q28 days Valentina Shaggy, MD   300 mg at 09/29/19 1523      .  PHYSICAL EXAMINATION: ECOG PERFORMANCE STATUS: 0 - Asymptomatic Vitals:   06/27/21 1010  BP: 125/71  Pulse: 76  Resp: 18  Temp: 97.9 F (36.6 C)   Filed Weights   06/27/21 1010  Weight: 155 lb 11.2 oz (70.6 kg)  Physical Exam Constitutional:      General: She is not in acute distress.    Appearance: She is not diaphoretic.  HENT:     Head: Normocephalic and atraumatic.     Nose: Nose normal.     Mouth/Throat:     Pharynx: No oropharyngeal exudate.  Eyes:      General: No scleral icterus.       Left eye: No discharge.     Conjunctiva/sclera: Conjunctivae normal.     Pupils: Pupils are equal, round, and reactive to light.  Neck:     Vascular:  No JVD.  Cardiovascular:     Rate and Rhythm: Normal rate and regular rhythm.     Heart sounds: Normal heart sounds. No murmur heard. Pulmonary:     Effort: Pulmonary effort is normal. No respiratory distress.     Breath sounds: Normal breath sounds. No wheezing.  Abdominal:     General: Bowel sounds are normal. There is no distension.     Palpations: Abdomen is soft.  Musculoskeletal:        General: No tenderness. Normal range of motion.     Cervical back: Normal range of motion and neck supple.  Lymphadenopathy:     Cervical: No cervical adenopathy.  Skin:    General: Skin is dry.     Findings: No rash.  Neurological:     Mental Status: She is alert and oriented to person, place, and time.     Cranial Nerves: No cranial nerve deficit.     Motor: No abnormal muscle tone.  Psychiatric:        Mood and Affect: Mood and affect normal.        Cognition and Memory: Memory normal.          LABORATORY DATA:  I have reviewed the data as listed Lab Results  Component Value Date   WBC 9.7 06/27/2021   HGB 13.9 06/27/2021   HCT 42.7 06/27/2021   MCV 87.3 06/27/2021   PLT 159 06/27/2021   Recent Labs    07/13/20 1256 01/11/21 1429 03/14/21 1024 06/27/21 0955  NA 140 139 141 139  K 4.0 3.9 4.0 3.8  CL 104 102 108* 106  CO2 29 27 25 27   GLUCOSE 94 98 115* 130*  BUN 12 10 7* 11  CREATININE 0.70 0.67 0.70 0.81  CALCIUM 9.5 9.2 9.3 8.9  GFRNONAA >60 >60  --  >60  PROT 7.7 7.5 7.7 8.3*  ALBUMIN 3.9 3.7 4.1 3.8  AST 20 17 20 25   ALT 21 23 21 29   ALKPHOS 56 52 72 62  BILITOT 0.8 0.6 0.4 0.6    RADIOGRAPHIC STUDIES: I have personally reviewed the radiological images as listed and agreed with the findings in the report. No results found.

## 2021-06-27 NOTE — Assessment & Plan Note (Signed)
Previous has rituximab treatments Patient is doing very well clinically. Labs are reviewed and discussed with patient. Platelet count is normal today.  Repeat CBC in 12 weeks Follow up in 6 months

## 2021-07-08 DIAGNOSIS — Z92240 Personal history of inhaled steroid therapy: Secondary | ICD-10-CM | POA: Diagnosis not present

## 2021-07-12 ENCOUNTER — Other Ambulatory Visit: Payer: Medicare Other

## 2021-07-12 ENCOUNTER — Ambulatory Visit: Payer: Medicare Other | Admitting: Oncology

## 2021-07-20 DIAGNOSIS — R0602 Shortness of breath: Secondary | ICD-10-CM | POA: Diagnosis not present

## 2021-07-20 DIAGNOSIS — M359 Systemic involvement of connective tissue, unspecified: Secondary | ICD-10-CM | POA: Insufficient documentation

## 2021-07-20 DIAGNOSIS — D693 Immune thrombocytopenic purpura: Secondary | ICD-10-CM | POA: Diagnosis not present

## 2021-07-20 DIAGNOSIS — G5600 Carpal tunnel syndrome, unspecified upper limb: Secondary | ICD-10-CM | POA: Insufficient documentation

## 2021-07-20 DIAGNOSIS — J3089 Other allergic rhinitis: Secondary | ICD-10-CM | POA: Diagnosis not present

## 2021-07-20 DIAGNOSIS — R059 Cough, unspecified: Secondary | ICD-10-CM | POA: Diagnosis not present

## 2021-07-24 ENCOUNTER — Emergency Department: Payer: Medicare Other

## 2021-07-24 ENCOUNTER — Other Ambulatory Visit: Payer: Self-pay

## 2021-07-24 ENCOUNTER — Inpatient Hospital Stay
Admission: EM | Admit: 2021-07-24 | Discharge: 2021-07-26 | DRG: 178 | Disposition: A | Discharge status: Home / Self Care | Payer: Medicare Other | Attending: Osteopathic Medicine | Admitting: Osteopathic Medicine

## 2021-07-24 DIAGNOSIS — I10 Essential (primary) hypertension: Secondary | ICD-10-CM | POA: Diagnosis present

## 2021-07-24 DIAGNOSIS — A419 Sepsis, unspecified organism: Secondary | ICD-10-CM | POA: Diagnosis not present

## 2021-07-24 DIAGNOSIS — Z794 Long term (current) use of insulin: Secondary | ICD-10-CM

## 2021-07-24 DIAGNOSIS — K760 Fatty (change of) liver, not elsewhere classified: Secondary | ICD-10-CM | POA: Diagnosis present

## 2021-07-24 DIAGNOSIS — T380X5A Adverse effect of glucocorticoids and synthetic analogues, initial encounter: Secondary | ICD-10-CM | POA: Diagnosis present

## 2021-07-24 DIAGNOSIS — E11649 Type 2 diabetes mellitus with hypoglycemia without coma: Secondary | ICD-10-CM | POA: Diagnosis not present

## 2021-07-24 DIAGNOSIS — F39 Unspecified mood [affective] disorder: Secondary | ICD-10-CM | POA: Diagnosis present

## 2021-07-24 DIAGNOSIS — Z8249 Family history of ischemic heart disease and other diseases of the circulatory system: Secondary | ICD-10-CM | POA: Diagnosis not present

## 2021-07-24 DIAGNOSIS — E876 Hypokalemia: Secondary | ICD-10-CM | POA: Diagnosis present

## 2021-07-24 DIAGNOSIS — E1165 Type 2 diabetes mellitus with hyperglycemia: Secondary | ICD-10-CM | POA: Diagnosis present

## 2021-07-24 DIAGNOSIS — R0602 Shortness of breath: Secondary | ICD-10-CM | POA: Diagnosis not present

## 2021-07-24 DIAGNOSIS — R739 Hyperglycemia, unspecified: Secondary | ICD-10-CM | POA: Diagnosis present

## 2021-07-24 DIAGNOSIS — U071 COVID-19: Secondary | ICD-10-CM | POA: Diagnosis not present

## 2021-07-24 DIAGNOSIS — E872 Acidosis, unspecified: Secondary | ICD-10-CM | POA: Diagnosis present

## 2021-07-24 DIAGNOSIS — E119 Type 2 diabetes mellitus without complications: Secondary | ICD-10-CM | POA: Diagnosis present

## 2021-07-24 DIAGNOSIS — D72829 Elevated white blood cell count, unspecified: Secondary | ICD-10-CM | POA: Diagnosis present

## 2021-07-24 DIAGNOSIS — J984 Other disorders of lung: Secondary | ICD-10-CM | POA: Diagnosis not present

## 2021-07-24 DIAGNOSIS — I700 Atherosclerosis of aorta: Secondary | ICD-10-CM | POA: Diagnosis not present

## 2021-07-24 DIAGNOSIS — R059 Cough, unspecified: Secondary | ICD-10-CM | POA: Diagnosis not present

## 2021-07-24 LAB — COMPREHENSIVE METABOLIC PANEL
ALT: 82 U/L — ABNORMAL HIGH (ref 0–44)
AST: 64 U/L — ABNORMAL HIGH (ref 15–41)
Albumin: 3.1 g/dL — ABNORMAL LOW (ref 3.5–5.0)
Alkaline Phosphatase: 63 U/L (ref 38–126)
Anion gap: 12 (ref 5–15)
BUN: 11 mg/dL (ref 8–23)
CO2: 22 mmol/L (ref 22–32)
Calcium: 8.2 mg/dL — ABNORMAL LOW (ref 8.9–10.3)
Chloride: 109 mmol/L (ref 98–111)
Creatinine, Ser: 0.83 mg/dL (ref 0.44–1.00)
GFR, Estimated: >60 mL/min (ref >60)
Glucose, Bld: 308 mg/dL — ABNORMAL HIGH (ref 70–99)
Potassium: 3.4 mmol/L — ABNORMAL LOW (ref 3.5–5.1)
Sodium: 143 mmol/L (ref 135–145)
Total Bilirubin: 0.6 mg/dL (ref 0.3–1.2)
Total Protein: 7.1 g/dL (ref 6.5–8.1)

## 2021-07-24 LAB — RESP PANEL BY RT-PCR (FLU A&B, COVID) ARPGX2
Influenza A by PCR: NEGATIVE
Influenza B by PCR: NEGATIVE
SARS Coronavirus 2 by RT PCR: POSITIVE — AB

## 2021-07-24 LAB — BLOOD GAS, VENOUS
Acid-base deficit: 2.6 mmol/L — ABNORMAL HIGH (ref 0.0–2.0)
Bicarbonate: 21.8 mmol/L (ref 20.0–28.0)
O2 Saturation: 79.1 %
Patient temperature: 37
pCO2, Ven: 36 mmHg — ABNORMAL LOW (ref 44–60)
pH, Ven: 7.39 (ref 7.25–7.43)
pO2, Ven: 48 mmHg — ABNORMAL HIGH (ref 32–45)

## 2021-07-24 LAB — URINALYSIS, ROUTINE W REFLEX MICROSCOPIC
Bacteria, UA: NONE SEEN
Bilirubin Urine: NEGATIVE
Glucose, UA: >=500 mg/dL — AB
Ketones, ur: NEGATIVE mg/dL
Leukocytes,Ua: NEGATIVE
Nitrite: NEGATIVE
Protein, ur: NEGATIVE mg/dL
Specific Gravity, Urine: 1 — ABNORMAL LOW (ref 1.005–1.030)
WBC, UA: NONE SEEN WBC/hpf (ref 0–5)
pH: 6 (ref 5.0–8.0)

## 2021-07-24 LAB — BASIC METABOLIC PANEL
Anion gap: 16 — ABNORMAL HIGH (ref 5–15)
BUN: 12 mg/dL (ref 8–23)
CO2: 18 mmol/L — ABNORMAL LOW (ref 22–32)
Calcium: 8.2 mg/dL — ABNORMAL LOW (ref 8.9–10.3)
Chloride: 108 mmol/L (ref 98–111)
Creatinine, Ser: 0.87 mg/dL (ref 0.44–1.00)
GFR, Estimated: >60 mL/min (ref >60)
Glucose, Bld: 336 mg/dL — ABNORMAL HIGH (ref 70–99)
Potassium: 3.4 mmol/L — ABNORMAL LOW (ref 3.5–5.1)
Sodium: 142 mmol/L (ref 135–145)

## 2021-07-24 LAB — LACTIC ACID, PLASMA
Lactic Acid, Venous: 6.2 mmol/L (ref 0.5–1.9)
Lactic Acid, Venous: 5.5 mmol/L (ref 0.5–1.9)

## 2021-07-24 LAB — CBG MONITORING, ED
Glucose-Capillary: 101 mg/dL — ABNORMAL HIGH (ref 70–99)
Glucose-Capillary: 133 mg/dL — ABNORMAL HIGH (ref 70–99)

## 2021-07-24 LAB — CBC
HCT: 45.4 % (ref 36.0–46.0)
Hemoglobin: 14.1 g/dL (ref 12.0–15.0)
MCH: 27.6 pg (ref 26.0–34.0)
MCHC: 31.1 g/dL (ref 30.0–36.0)
MCV: 89 fL (ref 80.0–100.0)
Platelets: 249 10*3/uL (ref 150–400)
RBC: 5.1 MIL/uL (ref 3.87–5.11)
RDW: 14.1 % (ref 11.5–15.5)
WBC: 16.5 10*3/uL — ABNORMAL HIGH (ref 4.0–10.5)
nRBC: 0 % (ref 0.0–0.2)

## 2021-07-24 LAB — TROPONIN I (HIGH SENSITIVITY)
Troponin I (High Sensitivity): 7 ng/L (ref <18)
Troponin I (High Sensitivity): 8 ng/L (ref <18)

## 2021-07-24 LAB — BETA-HYDROXYBUTYRIC ACID: Beta-Hydroxybutyric Acid: 0.12 mmol/L (ref 0.05–0.27)

## 2021-07-24 LAB — PROCALCITONIN: Procalcitonin: <0.1 ng/mL

## 2021-07-24 MED ORDER — IOHEXOL 350 MG/ML SOLN
75.0000 mL | Freq: Once | INTRAVENOUS | Status: AC | PRN
Start: 2021-07-24 — End: 2021-07-24
  Administered 2021-07-24: 75 mL via INTRAVENOUS

## 2021-07-24 MED ORDER — ZINC SULFATE 220 (50 ZN) MG PO CAPS
220.0000 mg | ORAL_CAPSULE | Freq: Every day | ORAL | Status: DC
  Administered 2021-07-24 – 2021-07-26 (×3): 220 mg via ORAL
  Filled 2021-07-24 (×3): qty 1

## 2021-07-24 MED ORDER — SODIUM CHLORIDE 0.9 % IV SOLN
500.0000 mg | INTRAVENOUS | Status: DC
  Administered 2021-07-24: 500 mg via INTRAVENOUS
  Filled 2021-07-24: qty 5

## 2021-07-24 MED ORDER — INSULIN ASPART 100 UNIT/ML IJ SOLN
8.0000 [IU] | Freq: Once | INTRAMUSCULAR | Status: AC
  Administered 2021-07-24: 8 [IU] via SUBCUTANEOUS
  Filled 2021-07-24: qty 1

## 2021-07-24 MED ORDER — TRAZODONE HCL 50 MG PO TABS
50.0000 mg | ORAL_TABLET | Freq: Every day | ORAL | Status: DC
  Administered 2021-07-24 – 2021-07-25 (×2): 50 mg via ORAL
  Filled 2021-07-24 (×2): qty 2

## 2021-07-24 MED ORDER — INSULIN DETEMIR 100 UNIT/ML ~~LOC~~ SOLN
0.1000 [IU]/kg | Freq: Two times a day (BID) | SUBCUTANEOUS | Status: DC
Start: 2021-07-24 — End: 2021-07-25
  Administered 2021-07-24: 7 [IU] via SUBCUTANEOUS
  Filled 2021-07-24 (×2): qty 0.07

## 2021-07-24 MED ORDER — ADULT MULTIVITAMIN W/MINERALS CH
1.0000 | ORAL_TABLET | Freq: Every day | ORAL | Status: DC
  Administered 2021-07-24 – 2021-07-26 (×3): 1 via ORAL
  Filled 2021-07-24 (×3): qty 1

## 2021-07-24 MED ORDER — LACTATED RINGERS IV SOLN
INTRAVENOUS | Status: DC
Start: 2021-07-24 — End: 2021-07-24

## 2021-07-24 MED ORDER — SODIUM CHLORIDE 0.9 % IV SOLN
2.0000 g | INTRAVENOUS | Status: DC
  Administered 2021-07-24: 2 g via INTRAVENOUS
  Filled 2021-07-24: qty 20

## 2021-07-24 MED ORDER — SODIUM CHLORIDE 0.9 % IV BOLUS
500.0000 mL | Freq: Once | INTRAVENOUS | Status: AC
  Administered 2021-07-24: 500 mL via INTRAVENOUS

## 2021-07-24 MED ORDER — MOMETASONE FURO-FORMOTEROL FUM 100-5 MCG/ACT IN AERO
2.0000 | INHALATION_SPRAY | Freq: Two times a day (BID) | RESPIRATORY_TRACT | Status: DC
  Filled 2021-07-24 (×2): qty 8.8

## 2021-07-24 MED ORDER — ONDANSETRON HCL 4 MG/2ML IJ SOLN
4.0000 mg | Freq: Four times a day (QID) | INTRAMUSCULAR | Status: DC | PRN
  Administered 2021-07-24: 4 mg via INTRAVENOUS
  Filled 2021-07-24: qty 2

## 2021-07-24 MED ORDER — PROCHLORPERAZINE EDISYLATE 10 MG/2ML IJ SOLN
5.0000 mg | Freq: Once | INTRAMUSCULAR | Status: AC
  Administered 2021-07-24: 5 mg via INTRAVENOUS
  Filled 2021-07-24: qty 2

## 2021-07-24 MED ORDER — SERTRALINE HCL 50 MG PO TABS
100.0000 mg | ORAL_TABLET | Freq: Every day | ORAL | Status: DC
  Administered 2021-07-25 – 2021-07-26 (×2): 100 mg via ORAL
  Filled 2021-07-24 (×2): qty 2

## 2021-07-24 MED ORDER — FAMOTIDINE 20 MG PO TABS
40.0000 mg | ORAL_TABLET | Freq: Every day | ORAL | Status: DC
Start: 2021-07-24 — End: 2021-07-26
  Administered 2021-07-24 – 2021-07-26 (×3): 40 mg via ORAL
  Filled 2021-07-24 (×3): qty 2

## 2021-07-24 MED ORDER — GUAIFENESIN-DM 100-10 MG/5ML PO SYRP
10.0000 mL | ORAL_SOLUTION | ORAL | Status: DC | PRN
Start: 2021-07-24 — End: 2021-07-25
  Administered 2021-07-25 (×2): 10 mL via ORAL
  Filled 2021-07-24 (×2): qty 10

## 2021-07-24 MED ORDER — LACTATED RINGERS IV BOLUS (SEPSIS)
2000.0000 mL | Freq: Once | INTRAVENOUS | Status: AC
Start: 2021-07-24 — End: 2021-07-24
  Administered 2021-07-24: 2000 mL via INTRAVENOUS

## 2021-07-24 MED ORDER — LACTATED RINGERS IV SOLN: INTRAVENOUS | Status: AC

## 2021-07-24 MED ORDER — ONDANSETRON HCL 4 MG PO TABS: 4.0000 mg | ORAL_TABLET | Freq: Four times a day (QID) | ORAL | Status: DC | PRN

## 2021-07-24 MED ORDER — IRBESARTAN 150 MG PO TABS
75.0000 mg | ORAL_TABLET | Freq: Every day | ORAL | Status: DC
Start: 2021-07-24 — End: 2021-07-26
  Administered 2021-07-24 – 2021-07-26 (×3): 75 mg via ORAL
  Filled 2021-07-24 (×3): qty 1

## 2021-07-24 MED ORDER — ASCORBIC ACID 500 MG PO TABS
500.0000 mg | ORAL_TABLET | Freq: Every day | ORAL | Status: DC
  Administered 2021-07-24 – 2021-07-26 (×3): 500 mg via ORAL
  Filled 2021-07-24 (×3): qty 1

## 2021-07-24 MED ORDER — ALBUTEROL SULFATE HFA 108 (90 BASE) MCG/ACT IN AERS
2.0000 | INHALATION_SPRAY | Freq: Four times a day (QID) | RESPIRATORY_TRACT | Status: DC
  Administered 2021-07-24 – 2021-07-26 (×6): 2 via RESPIRATORY_TRACT
  Filled 2021-07-24 (×3): qty 6.7

## 2021-07-24 MED ORDER — POTASSIUM CHLORIDE CRYS ER 20 MEQ PO TBCR
40.0000 meq | EXTENDED_RELEASE_TABLET | Freq: Once | ORAL | Status: AC
Start: 2021-07-24 — End: 2021-07-24
  Administered 2021-07-24: 40 meq via ORAL
  Filled 2021-07-24: qty 2

## 2021-07-24 MED ORDER — NIRMATRELVIR/RITONAVIR (PAXLOVID)TABLET
3.0000 | ORAL_TABLET | Freq: Two times a day (BID) | ORAL | Status: DC
  Administered 2021-07-24 – 2021-07-26 (×4): 3 via ORAL
  Filled 2021-07-24: qty 30

## 2021-07-24 MED ORDER — INSULIN ASPART 100 UNIT/ML IJ SOLN
0.0000 [IU] | Freq: Three times a day (TID) | INTRAMUSCULAR | Status: DC
  Administered 2021-07-25: 2 [IU] via SUBCUTANEOUS
  Filled 2021-07-24: qty 1

## 2021-07-24 MED ORDER — SIMVASTATIN 10 MG PO TABS: 40.0000 mg | ORAL_TABLET | Freq: Every day | ORAL | Status: DC

## 2021-07-24 MED ORDER — ENOXAPARIN SODIUM 40 MG/0.4ML IJ SOSY
40.0000 mg | PREFILLED_SYRINGE | INTRAMUSCULAR | Status: DC
  Filled 2021-07-24: qty 0.4

## 2021-07-24 NOTE — Consult Note (Signed)
CODE SEPSIS - PHARMACY COMMUNICATION  **Broad Spectrum Antibiotics should be administered within 1 hour of Sepsis diagnosis**  Time Code Sepsis Called/Page Received: 1637  Antibiotics Ordered: 1600  Time of 1st antibiotic administration: 1615 including 1st IVF bolus 1614  Additional action taken by pharmacy: N/A  If necessary, Name of Provider/Nurse Contacted: N/A  Martyn Malay ,PharmD Clinical Pharmacist  07/24/2021  4:55 PM

## 2021-07-24 NOTE — Assessment & Plan Note (Signed)
   Continue Avapro for blood pressure control

## 2021-07-24 NOTE — ED Provider Notes (Addendum)
[facility] Provider Note    Event Date/Time   First MD Initiated Contact with Patient 07/24/21 1453     (approximate)   History   Cough   HPI  Julia Garner is a 71 y.o. female with recent travel to [place] presents to the ER for 3 weeks of progressively worsening shortness of breath cough congestion some chills.  Just completed a course of azithromycin without improvement has also been on prednisone as well as inhalers without much improvement.  Denies any lower extremity swelling.  Denies any history of DVT or PE.  While in [place] she does not recall being bitten or attacked by any animals.  She was staying in nice hotels all the time.  Has not had any diarrhea no vomiting just having persistent frequent cough that is not getting better.     Physical Exam   Triage Vital Signs: ED Triage Vitals  Enc Vitals Group     BP 07/24/21 1418 (!) 176/157     Pulse Rate 07/24/21 1418 86     Resp 07/24/21 1418 18     Temp 07/24/21 1418 (!) 97.5 F (36.4 C)     Temp src --      SpO2 07/24/21 1418 98 %     Weight 07/24/21 1446 155 lb 10.3 oz (70.6 kg)     Height 07/24/21 1446 5\' 1"  (1.549 m)     Head Circumference --      Peak Flow --      Pain Score 07/24/21 1419 3     Pain Loc --      Pain Edu? --      Excl. in GC? --     Most recent vital signs: Vitals:   07/24/21 1418  BP: (!) 176/157  Pulse: 86  Resp: 18  Temp: (!) 97.5 F (36.4 C)  SpO2: 98%     Constitutional: Alert  Eyes: Conjunctivae are normal.  Head: Atraumatic. Nose: No congestion/rhinnorhea. Mouth/Throat: Mucous membranes are moist.   Neck: Painless ROM.  Cardiovascular:   Good peripheral circulation. Respiratory: Normal respiratory effort.  No retractions. Scattered wheeze Gastrointestinal: Soft and nontender.  Musculoskeletal:  no deformity Neurologic:  MAE spontaneously. No gross focal neurologic deficits are appreciated.  Skin:  Skin is warm, dry and intact.  No rash noted. Psychiatric: Mood and affect are normal. Speech and behavior are normal.    ED Results / Procedures / Treatments   Labs (all labs ordered are listed, but only abnormal results are displayed) Labs Reviewed  BASIC METABOLIC PANEL - Abnormal; Notable for the following components:      Result Value   Potassium 3.4 (*)    CO2 18 (*)    Glucose, Bld 336 (*)    Calcium 8.2 (*)    Anion gap 16 (*)    All other components within normal limits  CBC - Abnormal; Notable for the following components:   WBC 16.5 (*)    All other components within normal limits  URINALYSIS, ROUTINE W REFLEX MICROSCOPIC - Abnormal; Notable for the following components:   Color, Urine COLORLESS (*)    APPearance CLEAR (*)    Specific Gravity, Urine 1.000 (*)    Glucose, UA >=500 (*)    Hgb urine dipstick SMALL (*)    All other components within normal limits  LACTIC ACID, PLASMA - Abnormal; Notable for the following components:   Lactic Acid, Venous 6.2 (*)    All other components within  normal limits  BLOOD GAS, VENOUS - Abnormal; Notable for the following components:   pCO2, Ven 36 (*)    pO2, Ven 48 (*)    Acid-base deficit 2.6 (*)    All other components within normal limits  COMPREHENSIVE METABOLIC PANEL - Abnormal; Notable for the following components:   Potassium 3.4 (*)    Glucose, Bld 308 (*)    Calcium 8.2 (*)    Albumin 3.1 (*)    AST 64 (*)    ALT 82 (*)    All other components within normal limits  RESP PANEL BY RT-PCR (FLU A&B, COVID) ARPGX2  CULTURE, BLOOD (ROUTINE X 2)  CULTURE, BLOOD (ROUTINE X 2)  LACTIC ACID, PLASMA  PROCALCITONIN  PROCALCITONIN  TROPONIN I (HIGH SENSITIVITY)  TROPONIN I (HIGH SENSITIVITY)     EKG  ED ECG REPORT I, Willy Eddy, the attending physician, personally viewed and interpreted this ECG.   Date: 07/24/2021  EKG Time: 14:22  Rate: 85  Rhythm: sinus  Axis: normal  Intervals: normal  ST&T Change: no stemi, no  depressions    RADIOLOGY Please see ED Course for my review and interpretation.  I personally reviewed all radiographic images ordered to evaluate for the above acute complaints and reviewed radiology reports and findings.  These findings were personally discussed with the patient.  Please see medical record for radiology report.    PROCEDURES:  Critical Care performed: Yes, see critical care procedure note(s)  .Critical Care  Performed by: Willy Eddy, MD Authorized by: Willy Eddy, MD   Critical care provider statement:    Critical care time (minutes):  40   Critical care was necessary to treat or prevent imminent or life-threatening deterioration of the following conditions:  Sepsis   Critical care was time spent personally by me on the following activities:  Ordering and performing treatments and interventions, ordering and review of laboratory studies, ordering and review of radiographic studies, pulse oximetry, re-evaluation of patient's condition, review of old charts, obtaining history from patient or surrogate, examination of patient, evaluation of patient's response to treatment, discussions with primary provider, discussions with consultants and development of treatment plan with patient or surrogate    MEDICATIONS ORDERED IN ED: Medications  cefTRIAXone (ROCEPHIN) 2 g in sodium chloride 0.9 % 100 mL IVPB (0 g Intravenous Stopped 07/24/21 1625)  azithromycin (ZITHROMAX) 500 mg in sodium chloride 0.9 % 250 mL IVPB (500 mg Intravenous New Bag/Given 07/24/21 1630)  lactated ringers infusion (has no administration in time range)  lactated ringers bolus 2,000 mL (has no administration in time range)  insulin aspart (novoLOG) injection 8 Units (has no administration in time range)  sodium chloride 0.9 % bolus 500 mL (500 mLs Intravenous New Bag/Given 07/24/21 1614)  iohexol (OMNIPAQUE) 350 MG/ML injection 75 mL (75 mLs Intravenous Contrast Given 07/24/21 1636)      IMPRESSION / MDM / ASSESSMENT AND PLAN / ED COURSE  I reviewed the triage vital signs and the nursing notes.                              Differential diagnosis includes, but is not limited to, sepsis, pneumonia, pneumothorax, CHF, PE, bacteremia, DKA, electrolyte abnormality  Patient presented to ER for evaluation of symptoms as described above.  Patient protecting her airway but is ill-appearing mildly tachypneic no hypoxia but having chills rigors despite being on antibiotics and a course of prednisone and inhalers for bronchitis.  This presenting complaint could reflect a potentially life-threatening illness therefore the patient will be placed on continuous pulse oximetry and telemetry for monitoring.  Laboratory evaluation will be sent to evaluate for the above complaints.   Chest x-ray ordered at triage shows possible infiltrate but given recent prolonged travel back from [place] and how ill she appears will order CTA to rule out PE will give IV fluids.    Clinical Course as of 07/24/21 1711  Mon Jul 24, 2021  1637 Patient's lactate came back at 6.  She is normotensive but will give 30 cc/kg bolus of IV fluid.  DKA is on the differential given her hyperglycemia and anion gap metabolic acidosis awaiting VBG and urinalysis results.  Patient will require hospitalization. [PR]  1653 CTA on my review and interpretation does not show any evidence of PE or significant consolidation. [PR]  1701 We will order insulin for her hyperglycemia.  Patient will require hospitalization.  Will consult hospitalist for admission. [PR]    Clinical Course User Index [PR] Willy Eddy, MD    FINAL CLINICAL IMPRESSION(S) / ED DIAGNOSES   Final diagnoses:  Sepsis without acute organ dysfunction, due to unspecified organism [facility])  Hyperglycemia     Rx / DC Orders   ED Discharge Orders     None        Note:  This document was prepared using Dragon voice recognition software and  may include unintentional dictation errors.    Willy Eddy, MD 07/24/21 1702    Willy Eddy, MD 07/24/21 [phone]

## 2021-07-24 NOTE — H&P (Addendum)
History and Physical    Patient: Julia Garner BTD:[id number] DOB: [birth year] DOA: 07/24/2021 DOS: the patient was seen and examined on 07/25/2021 PCP: Birdie Sons, MD  Patient coming from: Home  Chief Complaint:  Chief Complaint  Patient presents with   Cough   HPI: Julia Garner is a 71 y.o. female with medical history significant for recurrent ITP, depression, hypertension, status post recent travel to [place] for 3 weeks who presents to the ER for evaluation of 3 weeks of exertional shortness of breath, nonproductive cough and nasal congestion. Symptoms have been associated with fever and chills as well as fatigue and weakness .  She complains of increased thirst as well as increased frequency of urination but she denies having any changes in her bowel habits, no urinary symptoms, no leg swelling, no headache, no dizziness, no lightheadedness, no blurred vision no focal deficit. She was started on a course of systemic steroids by her pulmonologist about 6 days ago and has been on 60 mg daily.  She has also completed a course of antibiotic therapy without any improvement in her symptoms. She states that her husband has been sick as well. Patient is vaccinated against the COVID-19 virus     Review of Systems: As mentioned in the history of present illness. All other systems reviewed and are negative. Past Medical History:  Diagnosis Date   Allergy    Chronic ITP (idiopathic thrombocytopenia) (HCC)    Chronic urticaria 08/15/2018   Depression    Head injuries    History of seizure 2007   one seizure    Hypertension    Past Surgical History:  Procedure Laterality Date   CATARACT EXTRACTION, BILATERAL  around 2021   Dr. Ellin Mayhew   CESAREAN SECTION  1990   CHOLECYSTECTOMY  2005   COLONOSCOPY WITH PROPOFOL N/A 12/20/2020   Procedure: COLONOSCOPY WITH PROPOFOL;  Surgeon: Lucilla Lame, MD;  Location[facility] END[facility]COPY;  Service: Endoscopy;  Laterality: N/A;   CYST  EXCISION  1996   Tracheal Cyst   Social History:  reports that she has never smoked. She has never used smokeless tobacco. She reports that she does not drink alcohol and does not use drugs.  Allergies  Allergen Reactions   Niacin And Related     As component of simcor (flushing)   Penicillins Itching    Hot flashes.   Statins     Other reaction(s): Other (See Comments) other   Tylenol  [Acetaminophen]     Family History  Problem Relation Age of Onset   Heart attack Father        1st MI at age 66    Prior to Admission medications   Medication Sig Start Date End Date Taking? Authorizing Provider  Bioflavonoid Products (VITAMIN C PLUS) 500 MG TABS Take 2 tablets by mouth daily.     [provider]  Blood Glucose Monitoring Suppl (ONE TOUCH ULTRA 2) w/Device KIT Use to check sugar daily for type 2 diabetes E11.9 07/10/19   Birdie Sons, MD  Calcium Carbonate-Vit D-Min (CALCIUM 1200 PO) Take by mouth.  Patient not taking: Reported on 06/27/2021    [provider]  cetirizine (ZYRTEC) 10 MG tablet Take 20 mg by mouth at bedtime as needed.     [provider]  cetirizine-pseudoephedrine (ZYRTEC-D) 5-120 MG tablet Take 1 tablet by mouth 2 (two) times daily. Patient taking differently: Take 1 tablet by mouth 2 (two) times daily as needed. 05/05/19   Birdie Sons,  MD  clobetasol (TEMOVATE) 0.05 % external solution Apply 1 application topically 2 (two) times daily. Patient not taking: Reported on 06/27/2021 02/23/20   Birdie Sons, MD  famotidine (PEPCID) 40 MG tablet Take 40 mg by mouth daily as needed.     [provider]  fexofenadine (ALLEGRA) 180 MG tablet Take 180 mg by mouth daily as needed.     [provider]  glucosamine-chondroitin 500-400 MG tablet Take 1 tablet by mouth 3 (three) times daily.    [provider]  glucose blood (ONETOUCH ULTRA) test strip Use to check sugar twice daily for type 2 diabetes E11.9 09/01/19    Trinna Post, PA-C  irbesartan (AVAPRO) 75 MG tablet TAKE 1 TABLET BY MOUTH DAILY 03/20/21   Birdie Sons, MD  Lancets [facility] ULTRASOFT) lancets Use to check sugar twice daily for type 2 diabetes E11.9 09/01/19   Trinna Post, PA-C  metFORMIN (GLUCOPHAGE-XR) 500 MG 24 hr tablet TAKE 1 TABLET BY MOUTH DAILY  WITH BREAKFAST 04/30/21   Birdie Sons, MD  Multiple Vitamin (MULTIVITAMIN) tablet Take 1 tablet by mouth daily.    [provider]  Multiple Vitamins-Minerals (ZINC PO) Take by mouth. Patient does not know dosage    [provider]  Naproxen Sodium 220 MG CAPS Take by mouth daily as needed.    [provider]  omalizumab Arvid Right) 150 MG/ML prefilled syringe Inject 300 mg into the skin every 14 (fourteen) days. 01/11/21   Valentina Shaggy, MD  Omega 3 1200 MG CAPS Take 2 capsules by mouth daily. 11/17/10   [provider]  sertraline (ZOLOFT) 50 MG tablet TAKE 2 TABLETS BY MOUTH  DAILY 10/03/20   Birdie Sons, MD  simvastatin (ZOCOR) 40 MG tablet TAKE 1 TABLET BY MOUTH AT  BEDTIME 06/11/21   Birdie Sons, MD  traZODone (DESYREL) 50 MG tablet Take 1-2 tablets (50-100 mg total) by mouth at bedtime. 06/05/21   Birdie Sons, MD  triamcinolone (KENALOG) 0.1 % Apply 1 application topically 2 (two) times daily. Patient not taking: Reported on 06/27/2021 12/08/19   Birdie Sons, MD  Ubiquinol 200 MG CAPS Take by mouth. CoQ10    [provider]    Physical Exam: Vitals:   07/24/21 1737 07/24/21 2107 07/24/21 2300 07/25/21 0550  BP: (!) 151/78 (!) 163/88 (!) 181/96 (!) 180/87  Pulse: 77 73 78 74  Resp: 19 19 20 14   Temp: 98 F (36.7 C)     TempSrc: Oral     SpO2: 97% 97% 97% 96%  Weight:      Height:       Physical Exam Vitals and nursing note reviewed.  Constitutional:      Appearance: Normal appearance.  HENT:     Head: Normocephalic and atraumatic.     Nose: Congestion present.     Mouth/Throat:      Mouth: Mucous membranes are dry.  Eyes:     Conjunctiva/sclera: Conjunctivae normal.  Cardiovascular:     Rate and Rhythm: Normal rate and regular rhythm.  Pulmonary:     Breath sounds: Wheezing present.     Comments: Scattered wheezes in both lung fields Abdominal:     General: Abdomen is flat.     Palpations: Abdomen is soft.  Musculoskeletal:        General: Normal range of motion.     Cervical back: Normal range of motion and neck supple.  Skin:    General:  Skin is warm and dry.  Neurological:     General: No focal deficit present.     Mental Status: She is alert and oriented to person, place, and time.  Psychiatric:        Mood and Affect: Mood normal.        Behavior: Behavior normal.     Data Reviewed: Relevant notes from primary care and specialist visits, past discharge summaries as available in EHR, including Care Everywhere. Prior diagnostic testing as pertinent to current admission diagnoses Updated medications and problem lists for reconciliation ED course, including vitals, labs, imaging, treatment and response to treatment Triage notes, nursing and pharmacy notes and ED provider's notes Notable results as noted in HPI Labs reviewed.  Sodium 143, potassium 3.4, chloride 109, bicarb 22, glucose 308, BUN 11, creatinine 0.83, calcium 8.2, total protein 7.1, albumin 3.1, AST 64, ALT 82, alk phos 63, total bilirubin 0.6, lactic acid 6.2, white count 16.5, hemoglobin 14.1, hematocrit 45.4, RDW 14.1, platelet count 249 Lactic acid 6.2 VBG 7.3 9/36/48/20 1.8 SARS coronavirus 2 PCR is positive Chest x-ray reviewed by me shows subtle LEFT basilar airspace disease likely atelectasis, difficult to exclude developing infection. CT angiogram of the chest shows no filling defect is identified in the pulmonary arterial tree to suggest pulmonary embolus. Atherosclerosis including the thoracic aorta and coronary arteries. Aortic Atherosclerosis (ICD10-I70.0). Mild cardiomegaly.  Airway thickening is present, suggesting bronchitis or reactive airways disease. Multifocal airway plugging in both lower lobes. Twelve-lead EKG reviewed by me shows normal sinus rhythm There are no new results to review at this time.  Assessment and Plan: * COVID-19 Patient presents for evaluation of a 3-week history of exertional shortness of breath, nonproductive cough and congestion. She was recently in [place] for Hajj Her SARS coronavirus 2 PCR is positive and she has a CT threshold of 39.2 Patient is vaccinated against the COVID-19 virus We will start patient on Paxlovid since she remains symptomatic Supportive care with bronchodilator therapy, inhaled steroids, vitamins No need for systemic steroids at this time since she is not hypoxic Check pulse oximetry every shift  Uncontrolled type 2 diabetes mellitus with hyperglycemia, without long-term current use of insulin ([place]) Patient with a history of diabetes mellitus on metformin who presents to the ER for evaluation of feeling unwell. She complains of increased frequency of urination as well as increased thirst. Noted to have significant hyperglycemia Aggressive IV fluid resuscitation Place patient on Levemir 7 units twice daily Sliding scale insulin for glycemic control Obtain hemoglobin A1c levels Hold metformin  Fatty liver Patient with a history of fatty liver She has transaminitis Hold statins while on Paxlovid to reduce risk of rhabdomyolysis  Hypokalemia Supplement potassium Check magnesium levels  Essential (primary) hypertension Continue Avapro for blood pressure control  Leukocytosis Most likely leukemoid reaction secondary to systemic steroid use. Check procalcitonin level to rule out an infectious process   Lactic acidosis Most likely secondary to metformin use with no evidence of sepsis at this time  Mood disorder (HCC) Stable Continue trazodone and sertraline      Advance Care Planning:    Code Status: Full Code   Consults: None  Family Communication:, Greater than 50% of time was spent discussing patient's condition and plan of care with her at the bedside.  All questions and concerns have been addressed.  She verbalizes understanding and agrees with the plan.  * I certify that at the point of admission it is my clinical judgment that the patient  will require inpatient hospital care spanning beyond 2 midnights from the point of admission due to high intensity of service, high risk for further deterioration and high frequency of surveillance required.*  Author: Collier Bullock, MD 07/25/2021 8:22 AM  For on call review [email or web address].

## 2021-07-24 NOTE — Assessment & Plan Note (Signed)
>>  ASSESSMENT AND PLAN FOR UNCONTROLLED TYPE 2 DIABETES MELLITUS WITH HYPERGLYCEMIA, WITHOUT LONG-TERM CURRENT USE OF INSULIN  (HCC) WRITTEN ON 07/25/2021  4:33 PM BY Authur Leghorn, NATALIE, DO  Patient with a history of diabetes mellitus on metformin  who presents to the ER for evaluation of feeling unwell. (+)COVID She complains of increased frequency of urination as well as increased thirst. Noted to have significant hyperglycemia Aggressive IV fluid resuscitation Place patient on Levemir  7 units twice daily --> had to be d/c d/t hypoglycemia Sliding scale insulin  for glycemic control Obtain hemoglobin A1c levels Hold metformin  DM coordinator following

## 2021-07-24 NOTE — Assessment & Plan Note (Signed)
Stable  Continue trazodone and sertraline

## 2021-07-24 NOTE — Assessment & Plan Note (Signed)
Patient with a history of fatty liver She has transaminitis  Hold statins while on Paxlovid to reduce risk of rhabdomyolysis

## 2021-07-24 NOTE — Assessment & Plan Note (Signed)
Patient with a history of diabetes mellitus on metformin who presents to the ER for evaluation of feeling unwell. She complains of increased frequency of urination as well as increased thirst. Noted to have significant hyperglycemia Aggressive IV fluid resuscitation Place patient on Levemir 7 units twice daily Sliding scale insulin for glycemic control Obtain hemoglobin A1c levels Hold metformin

## 2021-07-24 NOTE — Assessment & Plan Note (Addendum)
Most likely secondary to metformin use with no evidence of sepsis at this time  Held metformin  Trend lactate

## 2021-07-24 NOTE — Assessment & Plan Note (Signed)
Supplement potassium Check magnesium levels 

## 2021-07-24 NOTE — ED Triage Notes (Signed)
Pt comes with c/o sob for three weeks. Pt states cough and congestion. Pt states recent travel out of country. Pt states she was prescribed meds but nothing is helping.   Pt has labored breathing and having to take extra breaths when speaking.

## 2021-07-24 NOTE — Sepsis Progress Note (Addendum)
Code Sepsis called at 1637. ELink will monitor. Lactic acid,  blood cultures, IVF and antibiotics started prior to call. Initial lactic acid 6.2. Additional  2L LR order noted.

## 2021-07-24 NOTE — Assessment & Plan Note (Addendum)
Patient presents for evaluation of a 3-week history of exertional shortness of breath, nonproductive cough and congestion. She was recently in [place] for Hajj Her SARS coronavirus 2 PCR is positive and she has a CT threshold of 39.2 Patient is vaccinated against the COVID-19 virus  We will start patient on Paxlovid since she remains symptomatic  Supportive care with bronchodilator therapy, inhaled steroids, vitamins  No need for systemic steroids at this time since she is not hypoxic  Check pulse oximetry every shift

## 2021-07-24 NOTE — Sepsis Progress Note (Signed)
Notified bedside nurse of need to draw repeat lactic acid. 

## 2021-07-25 DIAGNOSIS — U071 COVID-19: Secondary | ICD-10-CM | POA: Diagnosis not present

## 2021-07-25 DIAGNOSIS — D72829 Elevated white blood cell count, unspecified: Secondary | ICD-10-CM | POA: Diagnosis present

## 2021-07-25 LAB — CBC WITH DIFFERENTIAL/PLATELET
Abs Immature Granulocytes: 0.29 10*3/uL — ABNORMAL HIGH (ref 0.00–0.07)
Basophils Absolute: 0.1 10*3/uL (ref 0.0–0.1)
Basophils Relative: 0 %
Eosinophils Absolute: 0 10*3/uL (ref 0.0–0.5)
Eosinophils Relative: 0 %
HCT: 39.2 % (ref 36.0–46.0)
Hemoglobin: 12.3 g/dL (ref 12.0–15.0)
Immature Granulocytes: 2 %
Lymphocytes Relative: 38 %
Lymphs Abs: 6.1 10*3/uL — ABNORMAL HIGH (ref 0.7–4.0)
MCH: 27.8 pg (ref 26.0–34.0)
MCHC: 31.4 g/dL (ref 30.0–36.0)
MCV: 88.5 fL (ref 80.0–100.0)
Monocytes Absolute: 1.3 10*3/uL — ABNORMAL HIGH (ref 0.1–1.0)
Monocytes Relative: 8 %
Neutro Abs: 8 10*3/uL — ABNORMAL HIGH (ref 1.7–7.7)
Neutrophils Relative %: 52 %
Platelets: 241 10*3/uL (ref 150–400)
RBC: 4.43 MIL/uL (ref 3.87–5.11)
RDW: 14.3 % (ref 11.5–15.5)
Smear Review: NORMAL
WBC: 15.8 10*3/uL — ABNORMAL HIGH (ref 4.0–10.5)
nRBC: 0 % (ref 0.0–0.2)

## 2021-07-25 LAB — COMPREHENSIVE METABOLIC PANEL
ALT: 60 U/L — ABNORMAL HIGH (ref 0–44)
AST: 32 U/L (ref 15–41)
Albumin: 2.4 g/dL — ABNORMAL LOW (ref 3.5–5.0)
Alkaline Phosphatase: 43 U/L (ref 38–126)
Anion gap: 9 (ref 5–15)
BUN: 8 mg/dL (ref 8–23)
CO2: 22 mmol/L (ref 22–32)
Calcium: 8.2 mg/dL — ABNORMAL LOW (ref 8.9–10.3)
Chloride: 116 mmol/L — ABNORMAL HIGH (ref 98–111)
Creatinine, Ser: 0.45 mg/dL (ref 0.44–1.00)
GFR, Estimated: >60 mL/min (ref >60)
Glucose, Bld: 68 mg/dL — ABNORMAL LOW (ref 70–99)
Potassium: 3.6 mmol/L (ref 3.5–5.1)
Sodium: 147 mmol/L — ABNORMAL HIGH (ref 135–145)
Total Bilirubin: 0.4 mg/dL (ref 0.3–1.2)
Total Protein: 5.4 g/dL — ABNORMAL LOW (ref 6.5–8.1)

## 2021-07-25 LAB — PROCALCITONIN: Procalcitonin: <0.1 ng/mL

## 2021-07-25 LAB — LACTIC ACID, PLASMA
Lactic Acid, Venous: 7.3 mmol/L (ref 0.5–1.9)
Lactic Acid, Venous: 3.3 mmol/L (ref 0.5–1.9)

## 2021-07-25 LAB — MAGNESIUM: Magnesium: 1.8 mg/dL (ref 1.7–2.4)

## 2021-07-25 LAB — GLUCOSE, CAPILLARY
Glucose-Capillary: 99 mg/dL (ref 70–99)
Glucose-Capillary: 116 mg/dL — ABNORMAL HIGH (ref 70–99)
Glucose-Capillary: 150 mg/dL — ABNORMAL HIGH (ref 70–99)

## 2021-07-25 LAB — HIV ANTIBODY (ROUTINE TESTING W REFLEX): HIV Screen 4th Generation wRfx: NONREACTIVE

## 2021-07-25 LAB — CBG MONITORING, ED: Glucose-Capillary: 138 mg/dL — ABNORMAL HIGH (ref 70–99)

## 2021-07-25 LAB — PHOSPHORUS: Phosphorus: 3.1 mg/dL (ref 2.5–4.6)

## 2021-07-25 MED ORDER — HYDROCODONE BIT-HOMATROP MBR 5-1.5 MG/5ML PO SOLN
5.0000 mL | ORAL | Status: DC | PRN
Start: 2021-07-25 — End: 2021-07-26
  Administered 2021-07-25: 5 mL via ORAL
  Filled 2021-07-25 (×2): qty 5

## 2021-07-25 MED ORDER — HYDRALAZINE HCL 20 MG/ML IJ SOLN: 10.0000 mg | Freq: Four times a day (QID) | INTRAMUSCULAR | Status: DC | PRN

## 2021-07-25 NOTE — ED Notes (Signed)
Patient had long coughing spell.  No sputum.  Given med for cough.

## 2021-07-25 NOTE — Progress Notes (Signed)
PROGRESS NOTE    Julia Garner  FIE:[id number] DOB: 02-20-1950  DOA: 07/24/2021 Date of Service: 07/25/21 PCP: Malva Limes, MD     Brief Narrative / Hospital Course:  Julia Garner is a 71 y.o. female with medical history significant for recurrent ITP, depression, hypertension, status post recent travel to [place] for 3 weeks who presents to the ER for evaluation of 3 weeks of exertional shortness of breath, nonproductive cough and nasal congestion. She was started on a course of systemic steroids by her pulmonologist about 6 days PTA and has been on 60 mg daily.  She has also completed a course of antibiotic therapy without any improvement in her symptoms. She states that her husband has been sick as well. Patient is vaccinated against the COVID-19 virus 07/10: Admitted for COVID-19, hyperglycemia, hypokalemia.  Started on Paxlovid.  No hypoxia, no need for systemic steroids at this time.  Continuing bronchodilator therapy, inhaled steroids, vitamins.  Significant elevation lactic acid, hold metformin.  SSI for glucose control.  Holding statins while on Paxlovid to reduce risk of rhabdomyolysis in patient with fatty liver/transaminitis.  Hypokalemia supplemented. 07/11: stable, significant coughing, Glc improving. If not needing O2 may be able to d/c tomorrow. BCx NG so far   Consultants:  none  Procedures: none    Subjective: Patient reports coughing is bothersome, she is very tired.      ASSESSMENT & PLAN:   Principal Problem:   COVID-19 Active Problems:   Uncontrolled type 2 diabetes mellitus with hyperglycemia, without long-term current use of insulin (HCC)   Fatty liver   Hypokalemia   Essential (primary) hypertension   Mood disorder (HCC)   Lactic acidosis   Leukocytosis   COVID   Uncontrolled type 2 diabetes mellitus with hyperglycemia, without long-term current use of insulin (HCC) Patient with a history of diabetes mellitus on metformin who  presents to the ER for evaluation of feeling unwell. (+)COVID She complains of increased frequency of urination as well as increased thirst. Noted to have significant hyperglycemia Aggressive IV fluid resuscitation Place patient on Levemir 7 units twice daily --> had to be d/c d/t hypoglycemia Sliding scale insulin for glycemic control Obtain hemoglobin A1c levels Hold metformin DM coordinator following   Mood disorder (HCC) Stable Continue trazodone and sertraline  Lactic acidosis Most likely secondary to metformin use with no evidence of sepsis at this time Held metformin Trend lactate  Hypokalemia Supplement potassium Check magnesium levels  Fatty liver Patient with a history of fatty liver She has transaminitis Hold statins while on Paxlovid to reduce risk of rhabdomyolysis  Essential (primary) hypertension Continue Avapro for blood pressure control  COVID-19 Patient presents for evaluation of a 3-week history of exertional shortness of breath, nonproductive cough and congestion. She was recently in [place] for Hajj Her SARS coronavirus 2 PCR is positive and she has a CT threshold of 39.2 Patient is vaccinated against the COVID-19 virus We will start patient on Paxlovid since she remains symptomatic Supportive care with bronchodilator therapy, inhaled steroids, vitamins No need for systemic steroids at this time since she is not hypoxic Check pulse oximetry every shift  Leukocytosis Most likely leukemoid reaction secondary to systemic steroid use. Procalcitonin negative     DVT prophylaxis: Lovenox  Code Status: FULL Family Communication: husband at bedside  Disposition Plan / TOC needs: home when stable Barriers to discharge / significant pending items: pending blood cultures.Marland KitchenMarland Kitchen  Objective: Vitals:   07/24/21 2300 07/25/21 0550 07/25/21 1130 07/25/21 1201  BP: (!) 181/96 (!) 180/87 (!) 176/84 (!) 147/80  Pulse: 78 74  75   Resp: 20 14  20   Temp:   98.2 F (36.8 C) 98.3 F (36.8 C)  TempSrc:    Oral  SpO2: 97% 96%  99%  Weight:      Height:        Intake/Output Summary (Last 24 hours) at 07/25/2021 1634 Last data filed at 07/25/2021 0747 Gross per 24 hour  Intake 2830.34 ml  Output --  Net 2830.34 ml   Filed Weights   07/24/21 1446  Weight: 70.6 kg    Examination:  Constitutional:  VS as above General Appearance: alert, well-developed, well-nourished, mild distress due to coughing  Eyes: Normal lids and conjunctive, non-icteric sclera Ears, Nose, Mouth, Throat: Normal appearance Neck: No masses, trachea midline Respiratory: Normal respiratory effort No dullness/hyper-resonance to percussion Breath sounds normal, no wheeze/rhonchi/rales Cardiovascular: S1/S2 normal, no murmur/rub/gallop auscultated No lower extremity edema Gastrointestinal: Nontender, no masses Musculoskeletal:  No clubbing/cyanosis of digits Neurological: No cranial nerve deficit on limited exam Motor and sensation intact and symmetric Psychiatric: Normal judgment/insight Normal mood and affect  Immunization History  Administered Date(s) Administered   Fluad Quad(high Dose 65+) 11/04/2018, 11/05/2019, 11/09/2020   Hepatitis B, adult 09/24/2017, 10/30/2017, 11/04/2018   Influenza, High Dose Seasonal PF 10/30/2017   Influenza,inj,Quad PF,6+ Mos 12/22/2014, 12/21/2015, 12/27/2016   Moderna Sars-Covid-2 Vaccination 02/13/2019, 03/11/2019   Pneumococcal Conjugate-13 10/30/2017   Pneumococcal Polysaccharide-23 11/04/2018   Td 05/26/1993   Tdap 05/08/2011      Scheduled Medications:   albuterol  2 puff Inhalation Q6H   vitamin C  500 mg Oral Daily   enoxaparin (LOVENOX) injection  40 mg Subcutaneous Q24H   famotidine  40 mg Oral Daily   insulin aspart  0-15 Units Subcutaneous TID WC   irbesartan  75 mg Oral Daily   mometasone-formoterol  2 puff Inhalation BID   multivitamin with minerals  1 tablet Oral  Daily   nirmatrelvir/ritonavir EUA  3 tablet Oral BID   sertraline  100 mg Oral Daily   traZODone  50-100 mg Oral QHS   zinc sulfate  220 mg Oral Daily    Continuous Infusions:   PRN Medications:  guaiFENesin-dextromethorphan, hydrALAZINE, ondansetron **OR** ondansetron (ZOFRAN) IV  Antimicrobials:  Anti-infectives (From admission, onward)    Start     Dose/Rate Route Frequency Ordered Stop   07/24/21 2200  nirmatrelvir/ritonavir EUA (PAXLOVID) 3 tablet        3 tablet Oral 2 times daily 07/24/21 1741 07/29/21 2159   07/24/21 1600  cefTRIAXone (ROCEPHIN) 2 g in sodium chloride 0.9 % 100 mL IVPB  Status:  Discontinued        2 g 200 mL/hr over 30 Minutes Intravenous Every 24 hours 07/24/21 1545 07/25/21 1122   07/24/21 1600  azithromycin (ZITHROMAX) 500 mg in sodium chloride 0.9 % 250 mL IVPB  Status:  Discontinued        500 mg 250 mL/hr over 60 Minutes Intravenous Every 24 hours 07/24/21 1545 07/25/21 1122       Data Reviewed: I have personally reviewed following labs and imaging studies  CBC: Recent Labs  Lab 07/24/21 1422 07/25/21 0554  WBC 16.5* 15.8*  NEUTROABS  --  8.0*  HGB 14.1 12.3  HCT 45.4 39.2  MCV 89.0 88.5  PLT 249 241   Basic Metabolic Panel: Recent Labs  Lab 07/24/21 1422  07/24/21 1547 07/25/21 0554  NA 142 143 147*  K 3.4* 3.4* 3.6  CL 108 109 116*  CO2 18* 22 22  GLUCOSE 336* 308* 68*  BUN 12 11 8   CREATININE 0.87 0.83 0.45  CALCIUM 8.2* 8.2* 8.2*  MG  --   --  1.8  PHOS  --   --  3.1   GFR: Estimated Creatinine Clearance: 59.6 mL/min (by C-G formula based on SCr of 0.45 mg/dL). Liver Function Tests: Recent Labs  Lab 07/24/21 1547 07/25/21 0554  AST 64* 32  ALT 82* 60*  ALKPHOS 63 43  BILITOT 0.6 0.4  PROT 7.1 5.4*  ALBUMIN 3.1* 2.4*   No results for input(s): "LIPASE", "AMYLASE" in the last 168 hours. No results for input(s): "AMMONIA" in the last 168 hours. Coagulation Profile: No results for input(s): "INR", "PROTIME"  in the last 168 hours. Cardiac Enzymes: No results for input(s): "CKTOTAL", "CKMB", "CKMBINDEX", "TROPONINI" in the last 168 hours. BNP (last 3 results) No results for input(s): "PROBNP" in the last 8760 hours. HbA1C: No results for input(s): "HGBA1C" in the last 72 hours. CBG: Recent Labs  Lab 07/24/21 2117 07/24/21 2300 07/25/21 0749 07/25/21 1241  GLUCAP 101* 133* 138* 99   Lipid Profile: No results for input(s): "CHOL", "HDL", "LDLCALC", "TRIG", "CHOLHDL", "LDLDIRECT" in the last 72 hours. Thyroid Function Tests: No results for input(s): "TSH", "T4TOTAL", "FREET4", "T3FREE", "THYROIDAB" in the last 72 hours. Anemia Panel: No results for input(s): "VITAMINB12", "FOLATE", "FERRITIN", "TIBC", "IRON", "RETICCTPCT" in the last 72 hours. Urine analysis:    Component Value Date/Time   COLORURINE COLORLESS (A) 07/24/2021 1547   APPEARANCEUR CLEAR (A) 07/24/2021 1547   LABSPEC 1.000 (L) 07/24/2021 1547   PHURINE 6.0 07/24/2021 1547   GLUCOSEU >=500 (A) 07/24/2021 1547   HGBUR SMALL (A) 07/24/2021 1547   BILIRUBINUR NEGATIVE 07/24/2021 1547   KETONESUR NEGATIVE 07/24/2021 1547   PROTEINUR NEGATIVE 07/24/2021 1547   NITRITE NEGATIVE 07/24/2021 1547   LEUKOCYTESUR NEGATIVE 07/24/2021 1547   Sepsis Labs: @LABRCNTIP (procalcitonin:4,lacticidven:4)  Recent Results (from the past 240 hour(s))  Resp Panel by RT-PCR (Flu A&B, Covid) Anterior Nasal Swab     Status: Abnormal   Collection Time: 07/24/21  3:47 PM   Specimen: Anterior Nasal Swab  Result Value Ref Range Status   SARS Coronavirus 2 by RT PCR POSITIVE (A) NEGATIVE Final    Comment: (NOTE) SARS-CoV-2 target nucleic acids are DETECTED.  The SARS-CoV-2 RNA is generally detectable in upper respiratory specimens during the acute phase of infection. Positive results are indicative of the presence of the identified virus, but do not rule out bacterial infection or co-infection with other pathogens not detected by the test.  Clinical correlation with patient history and other diagnostic information is necessary to determine patient infection status. The expected result is Negative.  Fact Sheet for Patients:  Fact Sheet for Healthcare Providers: 09/24/21  This test is not yet approved or cleared by the [employer] FDA and  has been authorized for detection and/or diagnosis of SARS-CoV-2 by FDA under an Emergency Use Authorization (EUA).  This EUA will remain in effect (meaning this test can be used) for the duration of  the COVID-19 declaration under Section 564(b)(1) of the A ct, 21 U.S.C. section 360bbb-3(b)(1), unless the authorization is terminated or revoked sooner.     Influenza A by PCR NEGATIVE NEGATIVE Final   Influenza B by PCR NEGATIVE NEGATIVE Final    Comment: (NOTE) The Xpert Xpress SARS-CoV-2/FLU/RSV plus assay is  intended as an aid in the diagnosis of influenza from Nasopharyngeal swab specimens and should not be used as a sole basis for treatment. Nasal washings and aspirates are unacceptable for Xpert Xpress SARS-CoV-2/FLU/RSV testing.  Fact Sheet for Patients: [employer]com  Fact Sheet for Healthcare Providers: SeriousBroker.it  This test is not yet approved or cleared by the [place] FDA and has been authorized for detection and/or diagnosis of SARS-CoV-2 by FDA under an Emergency Use Authorization (EUA). This EUA will remain in effect (meaning this test can be used) for the duration of the COVID-19 declaration under Section 564(b)(1) of the Act, 21 U.S.C. section 360bbb-3(b)(1), unless the authorization is terminated or revoked.  Performed at [facility], [address] [place], [place] [address]   Blood Culture (routine x 2)     Status: None (Preliminary result)   Collection Time: 07/24/21  3:47 PM   Specimen: BLOOD  Result  Value Ref Range Status   Specimen Description BLOOD LEFT ANTECUBITAL  Final   Special Requests   Final    BOTTLES DRAWN AEROBIC AND ANAEROBIC Blood Culture results may not be optimal due to an excessive volume of blood received in culture bottles   Culture   Final    NO GROWTH < 24 HOURS Performed at [facility], [address] [place], [place] [address]    Report Status PENDING  Incomplete         Radiology Studies last 96 hours: CT Angio Chest PE W and/or Wo Contrast  Result Date: 07/24/2021 CLINICAL DATA:  Shortness of breath for 3 weeks. Cough and congestion. Recent international travel. EXAM: CT ANGIOGRAPHY CHEST WITH CONTRAST TECHNIQUE: Multidetector CT imaging of the chest was performed using the standard protocol during bolus administration of intravenous contrast. Multiplanar CT image reconstructions and MIPs were obtained to evaluate the vascular anatomy. RADIATION DOSE REDUCTION: This exam was performed according to the departmental dose-optimization program which includes automated exposure control, adjustment of the mA and/or kV according to patient size and/or use of iterative reconstruction technique. CONTRAST:  68mL OMNIPAQUE IOHEXOL 350 MG/ML SOLN COMPARISON:  01/17/2017 chest CT FINDINGS: Cardiovascular: No filling defect is identified in the pulmonary arterial tree to suggest pulmonary embolus. Atherosclerotic calcification of the thoracic aorta, left anterior descending, right, and circumflex coronary artery. Mild cardiomegaly. Mediastinum/Nodes: Unremarkable Lungs/Pleura: Scarring or atelectasis in the lingula and right middle lobe. Airway thickening is present, suggesting bronchitis or reactive airways disease. Airway plugging in both lower lobes. Upper Abdomen: Cholecystectomy. Musculoskeletal: Mild thoracic spondylosis. Review of the MIP images confirms the above findings. IMPRESSION: 1. No filling defect is identified in the pulmonary arterial tree to suggest  pulmonary embolus. 2. Atherosclerosis including the thoracic aorta and coronary arteries. Aortic Atherosclerosis (ICD10-I70.0). Mild cardiomegaly. 3. Airway thickening is present, suggesting bronchitis or reactive airways disease. Multifocal airway plugging in both lower lobes. Electronically Signed   By: Gaylyn Rong M.D.   On: 07/24/2021 16:59   DG Chest 2 View  Result Date: 07/24/2021 CLINICAL DATA:  A 71 year old female presents for evaluation of increasing shortness of breath cough and congestion. EXAM: CHEST - 2 VIEW COMPARISON:  July 20, 2021. FINDINGS: Trachea midline. Cardiomediastinal contours and hilar structures are normal. Lungs are clear aside from subtle LEFT basilar airspace disease. There is no pneumothorax. No sign of pleural effusion. On limited assessment there is no acute skeletal process. IMPRESSION: Subtle LEFT basilar airspace disease likely atelectasis, difficult to exclude developing infection. Electronically Signed   By: Donzetta Kohut  M.D.   On: 07/24/2021 15:00            LOS: 1 day        Sunnie Nielsen, DO [facility] 07/25/2021, 4:34 PM   Staff may message me via secure chat in Epic  but this may not receive immediate response,  please page for urgent matters!  If 7PM-7AM, please contact night-coverage [email or web address]  Dictation software was used to generate the above note. Typos may occur and escape review, as with typed/written notes. Please contact Dr Lyn Hollingshead directly for clarity if needed.

## 2021-07-25 NOTE — Inpatient Diabetes Management (Signed)
Inpatient Diabetes Program Recommendations  AACE/ADA: New Consensus Statement on Inpatient Glycemic Control   Target Ranges:  Prepandial:   less than 140 mg/dL      Peak postprandial:   less than 180 mg/dL (1-2 hours)      Critically ill patients:  140 - 180 mg/dL    Latest Reference Range & Units 07/24/21 21:17 07/24/21 23:00 07/25/21 07:49  Glucose-Capillary 70 - 99 mg/dL 090 (H) 301 (H) 499 (H)    Latest Reference Range & Units 07/24/21 14:22 07/24/21 15:47 07/24/21 17:24 07/25/21 05:54  Beta-Hydroxybutyric Acid 0.05 - 0.27 mmol/L   0.12   Glucose 70 - 99 mg/dL 692 (H) 493 (H)  68 (L)    Latest Reference Range & Units 03/14/21 10:24  Hemoglobin A1C 4.8 - 5.6 % 6.2 (H)   Review of Glycemic Control  Diabetes history: DM2 Outpatient Diabetes medications: Metformin XR 500 mg QAM; Prednisone 12 day taper (prescribed 07/20/21) Current orders for Inpatient glycemic control: Levemir 7 units BID, Novolog 0-15 units TID with meals  Inpatient Diabetes Program Recommendations:    Insulin: Initial lab glucose 336 mg/dl on 2/41/99 at 14:44 (likely from recent Prednisone prescribed on 07/20/21). Lab glucose 68 mg/dl today and patient only received Levemir 7 units one time on 07/24/21. Please consider discontinuing Levemir and continue Novolog correction.   Thanks, Orlando Penner, RN, MSN, CDCES Diabetes Coordinator Inpatient Diabetes Program [phone] (Team Pager from 8am to 5pm)

## 2021-07-25 NOTE — Assessment & Plan Note (Addendum)
Most likely leukemoid reaction secondary to systemic steroid use.  Procalcitonin negative

## 2021-07-26 ENCOUNTER — Inpatient Hospital Stay: Payer: Medicare Other

## 2021-07-26 LAB — COMPREHENSIVE METABOLIC PANEL
ALT: 69 U/L — ABNORMAL HIGH (ref 0–44)
AST: 33 U/L (ref 15–41)
Albumin: 3.3 g/dL — ABNORMAL LOW (ref 3.5–5.0)
Alkaline Phosphatase: 56 U/L (ref 38–126)
Anion gap: 7 (ref 5–15)
BUN: 9 mg/dL (ref 8–23)
CO2: 29 mmol/L (ref 22–32)
Calcium: 8.6 mg/dL — ABNORMAL LOW (ref 8.9–10.3)
Chloride: 104 mmol/L (ref 98–111)
Creatinine, Ser: 0.85 mg/dL (ref 0.44–1.00)
GFR, Estimated: >60 mL/min (ref >60)
Glucose, Bld: 102 mg/dL — ABNORMAL HIGH (ref 70–99)
Potassium: 3.5 mmol/L (ref 3.5–5.1)
Sodium: 140 mmol/L (ref 135–145)
Total Bilirubin: 0.9 mg/dL (ref 0.3–1.2)
Total Protein: 7 g/dL (ref 6.5–8.1)

## 2021-07-26 LAB — CBC WITH DIFFERENTIAL/PLATELET
Abs Immature Granulocytes: 0.19 10*3/uL — ABNORMAL HIGH (ref 0.00–0.07)
Basophils Absolute: 0.1 10*3/uL (ref 0.0–0.1)
Basophils Relative: 0 %
Eosinophils Absolute: 0.4 10*3/uL (ref 0.0–0.5)
Eosinophils Relative: 2 %
HCT: 44.5 % (ref 36.0–46.0)
Hemoglobin: 14.4 g/dL (ref 12.0–15.0)
Immature Granulocytes: 1 %
Lymphocytes Relative: 50 %
Lymphs Abs: 7.9 10*3/uL — ABNORMAL HIGH (ref 0.7–4.0)
MCH: 27.9 pg (ref 26.0–34.0)
MCHC: 32.4 g/dL (ref 30.0–36.0)
MCV: 86.1 fL (ref 80.0–100.0)
Monocytes Absolute: 1.1 10*3/uL — ABNORMAL HIGH (ref 0.1–1.0)
Monocytes Relative: 6 %
Neutro Abs: 6.7 10*3/uL (ref 1.7–7.7)
Neutrophils Relative %: 41 %
Platelets: 260 10*3/uL (ref 150–400)
RBC: 5.17 MIL/uL — ABNORMAL HIGH (ref 3.87–5.11)
RDW: 14.3 % (ref 11.5–15.5)
Smear Review: NORMAL
WBC: 16.3 10*3/uL — ABNORMAL HIGH (ref 4.0–10.5)
nRBC: 0 % (ref 0.0–0.2)

## 2021-07-26 LAB — LACTIC ACID, PLASMA: Lactic Acid, Venous: 1.3 mmol/L (ref 0.5–1.9)

## 2021-07-26 LAB — GLUCOSE, CAPILLARY
Glucose-Capillary: 103 mg/dL — ABNORMAL HIGH (ref 70–99)
Glucose-Capillary: 128 mg/dL — ABNORMAL HIGH (ref 70–99)

## 2021-07-26 LAB — PROCALCITONIN: Procalcitonin: <0.1 ng/mL

## 2021-07-26 LAB — PATHOLOGIST SMEAR REVIEW

## 2021-07-26 LAB — PHOSPHORUS: Phosphorus: 5.3 mg/dL — ABNORMAL HIGH (ref 2.5–4.6)

## 2021-07-26 LAB — MAGNESIUM: Magnesium: 2.3 mg/dL (ref 1.7–2.4)

## 2021-07-26 MED ORDER — BENZONATATE 200 MG PO CAPS: 200.0000 mg | ORAL_CAPSULE | Freq: Three times a day (TID) | ORAL | 0 refills | Status: DC | PRN

## 2021-07-26 MED ORDER — ONDANSETRON HCL 4 MG PO TABS: 4.0000 mg | ORAL_TABLET | Freq: Four times a day (QID) | ORAL | 0 refills | Status: DC | PRN

## 2021-07-26 MED ORDER — ALBUTEROL SULFATE HFA 108 (90 BASE) MCG/ACT IN AERS: 2.0000 | INHALATION_SPRAY | Freq: Four times a day (QID) | RESPIRATORY_TRACT | 0 refills | Status: DC | PRN

## 2021-07-26 MED ORDER — NIRMATRELVIR/RITONAVIR (PAXLOVID)TABLET: 3.0000 | ORAL_TABLET | Freq: Two times a day (BID) | ORAL | 0 refills | Status: AC

## 2021-07-26 MED ORDER — MOMETASONE FURO-FORMOTEROL FUM 100-5 MCG/ACT IN AERO: 2.0000 | INHALATION_SPRAY | Freq: Two times a day (BID) | RESPIRATORY_TRACT | 0 refills | Status: DC

## 2021-07-26 MED ORDER — HYDROCODONE BIT-HOMATROP MBR 5-1.5 MG/5ML PO SOLN: 5.0000 mL | ORAL | 0 refills | Status: DC | PRN

## 2021-07-26 NOTE — Progress Notes (Signed)
  Transition of Care [facility]) Screening Note   Patient Details  Name: Julia Garner Date of Birth: 07-25-1950   [facility]) CM/SW Contact:    Gildardo Griffes, LCSW Phone Number: 07/26/2021, 9:09 AM    Transition of Care Department [facility]) has reviewed patient and no TOC needs have been identified at this time. We will continue to monitor patient advancement through interdisciplinary progression rounds. If new patient transition needs arise, please place a TOC consult.  [place], [place] [phone]

## 2021-07-26 NOTE — Discharge Planning (Addendum)
Physician Discharge Summary   Patient: Julia Garner MRN: [id number]  DOB: [birth year]   Admit:     Date of Admission: 07/24/2021 Admitted from: home   Discharge: Date of discharge: 07/26/21 Disposition: Home Condition at discharge: good  CODE STATUS: FULL     Discharge Physician: Emeterio Reeve, DO [facility]     PCP: Birdie Sons, MD  Recommendations for Outpatient Follow-up:  Follow up with PCP Birdie Sons, MD in 1-2 weeks Please obtain labs/tests: CBC, BMP and consider lactic acid level before restarting metformin Please follow up on the following pending results: final blood cultures (NGTD)   Discharge Instructions     Diet - low sodium heart healthy   Complete by: As directed    Discharge instructions   Complete by: As directed    Call your PCP for an appointment in 1-2 weeks Hold your Metformin for now until your PCP says ok to restart  Hold your simvastatin as this can interact with the Paxlovid antiviral. Once you've finished the Paxlovid, can restart the simvastatin.  Rx sent fo cough syrup (Hydocan) for severe cough, cough pills (benzonatate aka Tessalon) for mild-moderate cough, inhalers, antiviral (Paxlovid) to take tonight and the next 2 days, and nausea medicine (Zofran) as needed.   Increase activity slowly   Complete by: As directed       Hospital Course:  Julia Garner is a 71 y.o. female with medical history significant for recurrent ITP, depression, hypertension, status post recent travel to [place] for 3 weeks who presents to the ER for evaluation of 3 weeks of exertional shortness of breath, nonproductive cough and nasal congestion. She was started on a course of systemic steroids by her pulmonologist about 6 days PTA and has been on 60 mg daily.  She has also completed a course of antibiotic therapy without any improvement in her symptoms. She states that her husband has been sick as well. Patient is vaccinated  against the COVID-19 virus 07/10: Admitted for COVID-19, hyperglycemia, hypokalemia.  Started on Paxlovid.  No hypoxia, no need for systemic steroids at this time.  Continuing bronchodilator therapy, inhaled steroids, vitamins.  Significant elevation lactic acid, hold metformin.  SSI for glucose control.  Holding statins while on Paxlovid to reduce risk of rhabdomyolysis in patient with fatty liver/transaminitis.  Hypokalemia supplemented. 07/11: stable, significant coughing, Glc improving. If not needing O2 may be able to d/c tomorrow. BCx NG so far  07/12: remains off O2, cough improved, d/c on meds as below w/ instructions as above        Discharge Diagnoses: Principal Problem:   COVID-19 Active Problems:   Uncontrolled type 2 diabetes mellitus with hyperglycemia, without long-term current use of insulin (HCC)   Fatty liver   Hypokalemia   Essential (primary) hypertension   Mood disorder (HCC)   Lactic acidosis   Leukocytosis   COVID    Assessment & Plan:  COVID-19 We will start patient on Paxlovid since she remains symptomatic Supportive care with bronchodilator therapy, inhaled steroids, vitamins No need for systemic steroids at this time since she is not hypoxic Off supplemental O2  Uncontrolled type 2 diabetes mellitus with hyperglycemia, without long-term current use of insulin ([place]) - likely hyperglycemic d/t recent steroids  Aggressive IV fluid resuscitation Place patient on Levemir 7 units twice daily --> had to be d/c d/t hypoglycemia Sliding scale insulin for glycemic control while here A1c levels:<7 Hold metformin d/t lactic acidosis, restart per PCP  Mood  disorder (HCC) Stable Continue trazodone and sertraline  Lactic acidosis Most likely secondary to metformin use with no evidence of sepsis at this time Held metformin Trend lactate --> down  Hypokalemia Supplement potassium Check magnesium levels  Fatty liver Patient with a history of fatty  liver She has transaminitis Hold statins while on Paxlovid to reduce risk of rhabdomyolysis  Essential (primary) hypertension Continue Avapro for blood pressure control  Leukocytosis Most likely leukemoid reaction secondary to systemic steroid use. Procalcitonin negative BCx NGTD       Discharge Instructions  Allergies as of 07/26/2021       Reactions   Niacin And Related    As component of simcor (flushing)   Penicillins Itching   Hot flashes.   Statins    Other reaction(s): Other (See Comments) other   Tylenol  [acetaminophen]         Medication List     STOP taking these medications    azithromycin 250 MG tablet Commonly known as: ZITHROMAX   CALCIUM 1200 PO   cetirizine-pseudoephedrine 5-120 MG tablet Commonly known as: ZYRTEC-D   clobetasol 0.05 % external solution Commonly known as: TEMOVATE   fexofenadine 180 MG tablet Commonly known as: ALLEGRA   guaiFENesin 100 MG/5ML liquid Commonly known as: ROBITUSSIN   metFORMIN 500 MG 24 hr tablet Commonly known as: GLUCOPHAGE-XR   predniSONE 10 MG (21) Tbpk tablet Commonly known as: STERAPRED UNI-PAK 21 TAB   simvastatin 40 MG tablet Commonly known as: ZOCOR   triamcinolone cream 0.1 % Commonly known as: KENALOG   ZINC PO       TAKE these medications    albuterol 108 (90 Base) MCG/ACT inhaler Commonly known as: VENTOLIN HFA Inhale 2 puffs into the lungs every 6 (six) hours as needed for wheezing or shortness of breath.   ascorbic acid 500 MG tablet Commonly known as: VITAMIN C Take 500 mg by mouth daily.   benzonatate 200 MG capsule Commonly known as: TESSALON Take 1 capsule (200 mg total) by mouth 3 (three) times daily as needed for cough.   cetirizine 10 MG tablet Commonly known as: ZYRTEC Take 20 mg by mouth at bedtime as needed.   famotidine 40 MG tablet Commonly known as: PEPCID Take 40 mg by mouth daily as needed.   glucosamine-chondroitin 500-400 MG tablet Take 1  tablet by mouth 3 (three) times daily.   HYDROcodone bit-homatropine 5-1.5 MG/5ML syrup Commonly known as: HYCODAN Take 5 mLs by mouth every 4 (four) hours as needed for cough.   irbesartan 75 MG tablet Commonly known as: AVAPRO TAKE 1 TABLET BY MOUTH DAILY   mometasone-formoterol 100-5 MCG/ACT Aero Commonly known as: DULERA Inhale 2 puffs into the lungs 2 (two) times daily.   multivitamin tablet Take 1 tablet by mouth daily.   Naproxen Sodium 220 MG Caps Take by mouth daily as needed.   nirmatrelvir/ritonavir EUA 20 x 150 MG & 10 x 100MG Tabs Commonly known as: PAXLOVID Take 3 tablets by mouth 2 (two) times daily for 3 days. Take nirmatrelvir (150 mg) two tablets twice daily for 5 days and ritonavir (100 mg) one tablet twice daily for 5 days.   omalizumab 150 MG/ML prefilled syringe Commonly known as: Xolair Inject 300 mg into the skin every 14 (fourteen) days.   Omega 3 1200 MG Caps Take 2 capsules by mouth daily.   ondansetron 4 MG tablet Commonly known as: ZOFRAN Take 1 tablet (4 mg total) by mouth every 6 (six) hours as needed  for nausea.   ONE TOUCH ULTRA 2 w/Device Kit Use to check sugar daily for type 2 diabetes E11.9   OneTouch Ultra test strip Generic drug: glucose blood Use to check sugar twice daily for type 2 diabetes E11.9   onetouch ultrasoft lancets Use to check sugar twice daily for type 2 diabetes E11.9   sertraline 50 MG tablet Commonly known as: ZOLOFT TAKE 2 TABLETS BY MOUTH  DAILY   traZODone 50 MG tablet Commonly known as: DESYREL Take 1-2 tablets (50-100 mg total) by mouth at bedtime.   Ubiquinol 200 MG Caps Take by mouth. CoQ10   Vitamin C Plus 500 MG Tabs Take 2 tablets by mouth daily.          Allergies  Allergen Reactions   Niacin And Related     As component of simcor (flushing)   Penicillins Itching    Hot flashes.   Statins     Other reaction(s): Other (See Comments) other   Tylenol  [Acetaminophen]       Subjective: Feeling better today but still very tired, coughing is improved. Ambulating independently.    Discharge Exam: Vitals:   07/26/21 0729 07/26/21 1137  BP: 131/73 115/73  Pulse: 75 75  Resp: 18 18  Temp: 98.7 F (37.1 C) 98.8 F (37.1 C)  SpO2: 96% 97%   General: Pt is alert, awake, not in acute distress Cardiovascular: RRR, S1/S2 +, no rubs, no gallops Respiratory: CTA bilaterally, no wheezing, no rhonchi Abdominal: Soft, NT, ND, bowel sounds + Extremities: no edema, no cyanosis     The results of significant diagnostics from this hospitalization (including imaging, microbiology, ancillary and laboratory) are listed below for reference.     Microbiology: Recent Results (from the past 240 hour(s))  Resp Panel by RT-PCR (Flu A&B, Covid) Anterior Nasal Swab     Status: Abnormal   Collection Time: 07/24/21  3:47 PM   Specimen: Anterior Nasal Swab  Result Value Ref Range Status   SARS Coronavirus 2 by RT PCR POSITIVE (A) NEGATIVE Final    Comment: (NOTE) SARS-CoV-2 target nucleic acids are DETECTED.  The SARS-CoV-2 RNA is generally detectable in upper respiratory specimens during the acute phase of infection. Positive results are indicative of the presence of the identified virus, but do not rule out bacterial infection or co-infection with other pathogens not detected by the test. Clinical correlation with patient history and other diagnostic information is necessary to determine patient infection status. The expected result is Negative.  Fact Sheet for Patients: EntrepreneurPulse.com.au  Fact Sheet for Healthcare Providers: IncredibleEmployment.be  This test is not yet approved or cleared by the [place] FDA and  has been authorized for detection and/or diagnosis of SARS-CoV-2 by FDA under an Emergency Use Authorization (EUA).  This EUA will remain in effect (meaning this test can be used) for the duration of   the COVID-19 declaration under Section 564(b)(1) of the A ct, 21 U.S.C. section 360bbb-3(b)(1), unless the authorization is terminated or revoked sooner.     Influenza A by PCR NEGATIVE NEGATIVE Final   Influenza B by PCR NEGATIVE NEGATIVE Final    Comment: (NOTE) The Xpert Xpress SARS-CoV-2/FLU/RSV plus assay is intended as an aid in the diagnosis of influenza from Nasopharyngeal swab specimens and should not be used as a sole basis for treatment. Nasal washings and aspirates are unacceptable for Xpert Xpress SARS-CoV-2/FLU/RSV testing.  Fact Sheet for Patients: EntrepreneurPulse.com.au  Fact Sheet for Healthcare Providers: IncredibleEmployment.be  This test is not  yet approved or cleared by the [place] and has been authorized for detection and/or diagnosis of SARS-CoV-2 by FDA under an Emergency Use Authorization (EUA). This EUA will remain in effect (meaning this test can be used) for the duration of the COVID-19 declaration under Section 564(b)(1) of the Act, 21 U.S.C. section 360bbb-3(b)(1), unless the authorization is terminated or revoked.  Performed at [facility], [address] [place], [place] [address]   Blood Culture (routine x 2)     Status: None (Preliminary result)   Collection Time: 07/24/21  3:47 PM   Specimen: BLOOD  Result Value Ref Range Status   Specimen Description BLOOD LEFT ANTECUBITAL  Final   Special Requests   Final    BOTTLES DRAWN AEROBIC AND ANAEROBIC Blood Culture results may not be optimal due to an excessive volume of blood received in culture bottles   Culture   Final    NO GROWTH 2 DAYS Performed at [facility], [address], [place], [place] [address]    Report Status PENDING  Incomplete  Culture, blood (Routine X 2) w Reflex to ID Panel     Status: None (Preliminary result)   Collection Time: 07/25/21  1:58 PM   Specimen: BLOOD  Result Value Ref Range Status    Specimen Description BLOOD RIGHT ANTECUBITAL  Final   Special Requests   Final    BOTTLES DRAWN AEROBIC AND ANAEROBIC Blood Culture adequate volume   Culture   Final    NO GROWTH < 24 HOURS Performed at [facility], [address] [place], [place] [address]    Report Status PENDING  Incomplete     Labs: BNP (last 3 results) No results for input(s): "BNP" in the last 8760 hours. Basic Metabolic Panel: Recent Labs  Lab 07/24/21 1422 07/24/21 1547 07/25/21 0554 07/26/21 0535  NA 142 143 147* 140  K 3.4* 3.4* 3.6 3.5  CL 108 109 116* 104  CO2 18* _0 GLUCOSE 336* 308* 68* 102*  BUN _1 CREATININE 0.87 0.83 0.45 0.85  CALCIUM 8.2* 8.2* 8.2* 8.6*  MG  --   --  1.8 2.3  PHOS  --   --  3.1 5.3*   Liver Function Tests: Recent Labs  Lab 07/24/21 1547 07/25/21 0554 07/26/21 0535  AST 64* 32 33  ALT 82* 60* 69*  ALKPHOS 63 43 56  BILITOT 0.6 0.4 0.9  PROT 7.1 5.4* 7.0  ALBUMIN 3.1* 2.4* 3.3*   No results for input(s): "LIPASE", "AMYLASE" in the last 168 hours. No results for input(s): "AMMONIA" in the last 168 hours. CBC: Recent Labs  Lab 07/24/21 1422 07/25/21 0554 07/26/21 0535  WBC 16.5* 15.8* 16.3*  NEUTROABS  --  8.0* 6.7  HGB 14.1 12.3 14.4  HCT 45.4 39.2 44.5  MCV 89.0 88.5 86.1  PLT 249 241 260   Cardiac Enzymes: No results for input(s): "CKTOTAL", "CKMB", "CKMBINDEX", "TROPONINI" in the last 168 hours. BNP: Invalid input(s): "POCBNP" CBG: Recent Labs  Lab 07/25/21 1241 07/25/21 1643 07/25/21 2039 07/26/21 0733 07/26/21 1138  GLUCAP 99 116* 150* 103* 128*   D-Dimer No results for input(s): "DDIMER" in the last 72 hours. Hgb A1c No results for input(s): "HGBA1C" in the last 72 hours. Lipid Profile No results for input(s): "CHOL", "HDL", "LDLCALC", "TRIG", "CHOLHDL", "LDLDIRECT" in the last 72 hours. Thyroid function studies No results for input(s): "TSH", "T4TOTAL", "T3FREE", "THYROIDAB" in the last 72  hours.  Invalid input(s): "FREET3" Anemia work up No results for input(s): "VITAMINB12", "FOLATE", "FERRITIN", "TIBC", "IRON", "RETICCTPCT" in the last 72 hours. Urinalysis    Component Value Date/Time   COLORURINE COLORLESS (A) 07/24/2021 1547   APPEARANCEUR CLEAR (A) 07/24/2021 1547   LABSPEC 1.000 (L) 07/24/2021 1547   PHURINE 6.0 07/24/2021 1547   GLUCOSEU >=500 (A) 07/24/2021 1547   HGBUR SMALL (A) 07/24/2021 1547   BILIRUBINUR NEGATIVE 07/24/2021 1547   KETONESUR NEGATIVE 07/24/2021 1547   PROTEINUR NEGATIVE 07/24/2021 1547   NITRITE NEGATIVE 07/24/2021 1547   LEUKOCYTESUR NEGATIVE 07/24/2021 1547   Sepsis Labs Recent Labs  Lab 07/24/21 1422 07/25/21 0554 07/26/21 0535  WBC 16.5* 15.8* 16.3*   Microbiology Recent Results (from the past 240 hour(s))  Resp Panel by RT-PCR (Flu A&B, Covid) Anterior Nasal Swab     Status: Abnormal   Collection Time: 07/24/21  3:47 PM   Specimen: Anterior Nasal Swab  Result Value Ref Range Status   SARS Coronavirus 2 by RT PCR POSITIVE (A) NEGATIVE Final    Comment: (NOTE) SARS-CoV-2 target nucleic acids are DETECTED.  The SARS-CoV-2 RNA is generally detectable in upper respiratory specimens during the acute phase of infection. Positive results are indicative of the presence of the identified virus, but do not rule out bacterial infection or co-infection with other pathogens not detected by the test. Clinical correlation with patient history and other diagnostic information is necessary to determine patient infection status. The expected result is Negative.  Fact Sheet for Patients: EntrepreneurPulse.com.au  Fact Sheet for Healthcare Providers: IncredibleEmployment.be  This test is not yet approved or cleared by the [place] FDA and  has been authorized for detection and/or diagnosis of SARS-CoV-2 by FDA under an Emergency Use Authorization (EUA).  This EUA will remain in effect (meaning  this test can be used) for the duration of  the COVID-19 declaration under Section 564(b)(1) of the A ct, 21 U.S.C. section 360bbb-3(b)(1), unless the authorization is terminated or revoked sooner.     Influenza A by PCR NEGATIVE NEGATIVE Final   Influenza B by PCR NEGATIVE NEGATIVE Final    Comment: (NOTE) The Xpert Xpress SARS-CoV-2/FLU/RSV plus assay is intended as an aid in the diagnosis of influenza from Nasopharyngeal swab specimens and should not be used as a sole basis for treatment. Nasal washings and aspirates are unacceptable for Xpert Xpress SARS-CoV-2/FLU/RSV testing.  Fact Sheet for Patients: EntrepreneurPulse.com.au  Fact Sheet for Healthcare Providers: IncredibleEmployment.be  This test is not yet approved or cleared by the [place] FDA and has been authorized for detection and/or diagnosis of SARS-CoV-2 by FDA under an Emergency Use Authorization (EUA). This EUA will remain in effect (meaning this test can be used) for the duration of the COVID-19 declaration under Section 564(b)(1) of the Act, 21 U.S.C. section 360bbb-3(b)(1), unless the authorization is terminated or revoked.  Performed at [facility], [address] [place], [place] [address]   Blood Culture (routine x 2)     Status: None (Preliminary result)   Collection Time: 07/24/21  3:47 PM   Specimen: BLOOD  Result Value Ref Range Status   Specimen Description BLOOD LEFT ANTECUBITAL  Final   Special Requests   Final    BOTTLES DRAWN AEROBIC AND ANAEROBIC Blood Culture results may not be optimal due to an excessive volume of blood received in culture bottles   Culture   Final    NO GROWTH 2 DAYS Performed at [facility], [address] [place],  [place] [address]    Report Status PENDING  Incomplete  Culture, blood (Routine X 2) w Reflex to ID Panel     Status: None (Preliminary result)   Collection Time: 07/25/21  1:58 PM   Specimen:  BLOOD  Result Value Ref Range Status   Specimen Description BLOOD RIGHT ANTECUBITAL  Final   Special Requests   Final    BOTTLES DRAWN AEROBIC AND ANAEROBIC Blood Culture adequate volume   Culture   Final    NO GROWTH < 24 HOURS Performed at [facility], [address], [place], [place] [address]    Report Status PENDING  Incomplete   Imaging CT Angio Chest PE W and/or Wo Contrast  Result Date: 07/24/2021 CLINICAL DATA:  Shortness of breath for 3 weeks. Cough and congestion. Recent international travel. EXAM: CT ANGIOGRAPHY CHEST WITH CONTRAST TECHNIQUE: Multidetector CT imaging of the chest was performed using the standard protocol during bolus administration of intravenous contrast. Multiplanar CT image reconstructions and MIPs were obtained to evaluate the vascular anatomy. RADIATION DOSE REDUCTION: This exam was performed according to the departmental dose-optimization program which includes automated exposure control, adjustment of the mA and/or kV according to patient size and/or use of iterative reconstruction technique. CONTRAST:  27m OMNIPAQUE IOHEXOL 350 MG/ML SOLN COMPARISON:  01/17/2017 chest CT FINDINGS: Cardiovascular: No filling defect is identified in the pulmonary arterial tree to suggest pulmonary embolus. Atherosclerotic calcification of the thoracic aorta, left anterior descending, right, and circumflex coronary artery. Mild cardiomegaly. Mediastinum/Nodes: Unremarkable Lungs/Pleura: Scarring or atelectasis in the lingula and right middle lobe. Airway thickening is present, suggesting bronchitis or reactive airways disease. Airway plugging in both lower lobes. Upper Abdomen: Cholecystectomy. Musculoskeletal: Mild thoracic spondylosis. Review of the MIP images confirms the above findings. IMPRESSION: 1. No filling defect is identified in the pulmonary arterial tree to suggest pulmonary embolus. 2. Atherosclerosis including the thoracic aorta and coronary arteries. Aortic  Atherosclerosis (ICD10-I70.0). Mild cardiomegaly. 3. Airway thickening is present, suggesting bronchitis or reactive airways disease. Multifocal airway plugging in both lower lobes. Electronically Signed   By: WVan ClinesM.D.   On: 07/24/2021 16:59   DG Chest 2 View  Result Date: 07/24/2021 CLINICAL DATA:  A 71year old female presents for evaluation of increasing shortness of breath cough and congestion. EXAM: CHEST - 2 VIEW COMPARISON:  July 20, 2021. FINDINGS: Trachea midline. Cardiomediastinal contours and hilar structures are normal. Lungs are clear aside from subtle LEFT basilar airspace disease. There is no pneumothorax. No sign of pleural effusion. On limited assessment there is no acute skeletal process. IMPRESSION: Subtle LEFT basilar airspace disease likely atelectasis, difficult to exclude developing infection. Electronically Signed   By: GZetta BillsM.D.   On: 07/24/2021 15:00      Time coordinating discharge: Over 30 minutes  SIGNED:  NEmeterio ReeveDO [facility]

## 2021-07-26 NOTE — Discharge Summary (Signed)
Physician Discharge Summary   Patient: Julia Garner MRN: [id number]  DOB: [birth year]   Admit:     Date of Admission: 07/24/2021 Admitted from: home   Discharge: Date of discharge: 07/26/21 Disposition: Home Condition at discharge: good  CODE STATUS: FULL     Discharge Physician: Emeterio Reeve, DO [facility]     PCP: Birdie Sons, MD  Recommendations for Outpatient Follow-up:  Follow up with PCP Birdie Sons, MD in 1-2 weeks Please obtain labs/tests: CBC, BMP and consider lactic acid level before restarting metformin Please follow up on the following pending results: final blood cultures (NGTD)   Discharge Instructions     Diet - low sodium heart healthy   Complete by: As directed    Discharge instructions   Complete by: As directed    Call your PCP for an appointment in 1-2 weeks Hold your Metformin for now until your PCP says ok to restart  Hold your simvastatin as this can interact with the Paxlovid antiviral. Once you've finished the Paxlovid, can restart the simvastatin.  Rx sent fo cough syrup (Hydocan) for severe cough, cough pills (benzonatate aka Tessalon) for mild-moderate cough, inhalers, antiviral (Paxlovid) to take tonight and the next 2 days, and nausea medicine (Zofran) as needed.   Increase activity slowly   Complete by: As directed       Hospital Course:  Julia Garner is a 71 y.o. female with medical history significant for recurrent ITP, depression, hypertension, status post recent travel to [place] for 3 weeks who presents to the ER for evaluation of 3 weeks of exertional shortness of breath, nonproductive cough and nasal congestion. She was started on a course of systemic steroids by her pulmonologist about 6 days PTA and has been on 60 mg daily.  She has also completed a course of antibiotic therapy without any improvement in her symptoms. She states that her husband has been sick as well. Patient is vaccinated  against the COVID-19 virus 07/10: Admitted for COVID-19, hyperglycemia, hypokalemia.  Started on Paxlovid.  No hypoxia, no need for systemic steroids at this time.  Continuing bronchodilator therapy, inhaled steroids, vitamins.  Significant elevation lactic acid, hold metformin.  SSI for glucose control.  Holding statins while on Paxlovid to reduce risk of rhabdomyolysis in patient with fatty liver/transaminitis.  Hypokalemia supplemented. 07/11: stable, significant coughing, Glc improving. If not needing O2 may be able to d/c tomorrow. BCx NG so far  07/12: remains off O2, cough improved, d/c on meds as below w/ instructions as above        Discharge Diagnoses: Principal Problem:   COVID-19 Active Problems:   Uncontrolled type 2 diabetes mellitus with hyperglycemia, without long-term current use of insulin (HCC)   Fatty liver   Hypokalemia   Essential (primary) hypertension   Mood disorder (HCC)   Lactic acidosis   Leukocytosis   COVID    Assessment & Plan:  COVID-19 We will start patient on Paxlovid since she remains symptomatic Supportive care with bronchodilator therapy, inhaled steroids, vitamins No need for systemic steroids at this time since she is not hypoxic Off supplemental O2  Uncontrolled type 2 diabetes mellitus with hyperglycemia, without long-term current use of insulin ([place]) - likely hyperglycemic d/t recent steroids  Aggressive IV fluid resuscitation Place patient on Levemir 7 units twice daily --> had to be d/c d/t hypoglycemia Sliding scale insulin for glycemic control while here A1c levels:<7 Hold metformin d/t lactic acidosis, restart per PCP  Mood  disorder (HCC) Stable Continue trazodone and sertraline  Lactic acidosis Most likely secondary to metformin use with no evidence of sepsis at this time Held metformin Trend lactate --> down  Hypokalemia Supplement potassium Check magnesium levels  Fatty liver Patient with a history of fatty  liver She has transaminitis Hold statins while on Paxlovid to reduce risk of rhabdomyolysis  Essential (primary) hypertension Continue Avapro for blood pressure control  Leukocytosis Most likely leukemoid reaction secondary to systemic steroid use. Procalcitonin negative BCx NGTD       Discharge Instructions  Allergies as of 07/26/2021       Reactions   Niacin And Related    As component of simcor (flushing)   Penicillins Itching   Hot flashes.   Statins    Other reaction(s): Other (See Comments) other   Tylenol  [acetaminophen]         Medication List     STOP taking these medications    azithromycin 250 MG tablet Commonly known as: ZITHROMAX   CALCIUM 1200 PO   cetirizine-pseudoephedrine 5-120 MG tablet Commonly known as: ZYRTEC-D   clobetasol 0.05 % external solution Commonly known as: TEMOVATE   fexofenadine 180 MG tablet Commonly known as: ALLEGRA   guaiFENesin 100 MG/5ML liquid Commonly known as: ROBITUSSIN   metFORMIN 500 MG 24 hr tablet Commonly known as: GLUCOPHAGE-XR   predniSONE 10 MG (21) Tbpk tablet Commonly known as: STERAPRED UNI-PAK 21 TAB   simvastatin 40 MG tablet Commonly known as: ZOCOR   triamcinolone cream 0.1 % Commonly known as: KENALOG   ZINC PO       TAKE these medications    albuterol 108 (90 Base) MCG/ACT inhaler Commonly known as: VENTOLIN HFA Inhale 2 puffs into the lungs every 6 (six) hours as needed for wheezing or shortness of breath.   ascorbic acid 500 MG tablet Commonly known as: VITAMIN C Take 500 mg by mouth daily.   benzonatate 200 MG capsule Commonly known as: TESSALON Take 1 capsule (200 mg total) by mouth 3 (three) times daily as needed for cough.   cetirizine 10 MG tablet Commonly known as: ZYRTEC Take 20 mg by mouth at bedtime as needed.   famotidine 40 MG tablet Commonly known as: PEPCID Take 40 mg by mouth daily as needed.   glucosamine-chondroitin 500-400 MG tablet Take 1  tablet by mouth 3 (three) times daily.   HYDROcodone bit-homatropine 5-1.5 MG/5ML syrup Commonly known as: HYCODAN Take 5 mLs by mouth every 4 (four) hours as needed for cough.   irbesartan 75 MG tablet Commonly known as: AVAPRO TAKE 1 TABLET BY MOUTH DAILY   mometasone-formoterol 100-5 MCG/ACT Aero Commonly known as: DULERA Inhale 2 puffs into the lungs 2 (two) times daily.   multivitamin tablet Take 1 tablet by mouth daily.   Naproxen Sodium 220 MG Caps Take by mouth daily as needed.   nirmatrelvir/ritonavir EUA 20 x 150 MG & 10 x 100MG Tabs Commonly known as: PAXLOVID Take 3 tablets by mouth 2 (two) times daily for 3 days. Take nirmatrelvir (150 mg) two tablets twice daily for 5 days and ritonavir (100 mg) one tablet twice daily for 5 days.   omalizumab 150 MG/ML prefilled syringe Commonly known as: Xolair Inject 300 mg into the skin every 14 (fourteen) days.   Omega 3 1200 MG Caps Take 2 capsules by mouth daily.   ondansetron 4 MG tablet Commonly known as: ZOFRAN Take 1 tablet (4 mg total) by mouth every 6 (six) hours as needed  for nausea.   ONE TOUCH ULTRA 2 w/Device Kit Use to check sugar daily for type 2 diabetes E11.9   OneTouch Ultra test strip Generic drug: glucose blood Use to check sugar twice daily for type 2 diabetes E11.9   onetouch ultrasoft lancets Use to check sugar twice daily for type 2 diabetes E11.9   sertraline 50 MG tablet Commonly known as: ZOLOFT TAKE 2 TABLETS BY MOUTH  DAILY   traZODone 50 MG tablet Commonly known as: DESYREL Take 1-2 tablets (50-100 mg total) by mouth at bedtime.   Ubiquinol 200 MG Caps Take by mouth. CoQ10   Vitamin C Plus 500 MG Tabs Take 2 tablets by mouth daily.          Allergies  Allergen Reactions   Niacin And Related     As component of simcor (flushing)   Penicillins Itching    Hot flashes.   Statins     Other reaction(s): Other (See Comments) other   Tylenol  [Acetaminophen]       Subjective: Feeling better today but still very tired, coughing is improved. Ambulating independently.    Discharge Exam: Vitals:   07/26/21 0729 07/26/21 1137  BP: 131/73 115/73  Pulse: 75 75  Resp: 18 18  Temp: 98.7 F (37.1 C) 98.8 F (37.1 C)  SpO2: 96% 97%   General: Pt is alert, awake, not in acute distress Cardiovascular: RRR, S1/S2 +, no rubs, no gallops Respiratory: CTA bilaterally, no wheezing, no rhonchi Abdominal: Soft, NT, ND, bowel sounds + Extremities: no edema, no cyanosis     The results of significant diagnostics from this hospitalization (including imaging, microbiology, ancillary and laboratory) are listed below for reference.     Microbiology: Recent Results (from the past 240 hour(s))  Resp Panel by RT-PCR (Flu A&B, Covid) Anterior Nasal Swab     Status: Abnormal   Collection Time: 07/24/21  3:47 PM   Specimen: Anterior Nasal Swab  Result Value Ref Range Status   SARS Coronavirus 2 by RT PCR POSITIVE (A) NEGATIVE Final    Comment: (NOTE) SARS-CoV-2 target nucleic acids are DETECTED.  The SARS-CoV-2 RNA is generally detectable in upper respiratory specimens during the acute phase of infection. Positive results are indicative of the presence of the identified virus, but do not rule out bacterial infection or co-infection with other pathogens not detected by the test. Clinical correlation with patient history and other diagnostic information is necessary to determine patient infection status. The expected result is Negative.  Fact Sheet for Patients: EntrepreneurPulse.com.au  Fact Sheet for Healthcare Providers: IncredibleEmployment.be  This test is not yet approved or cleared by the [place] FDA and  has been authorized for detection and/or diagnosis of SARS-CoV-2 by FDA under an Emergency Use Authorization (EUA).  This EUA will remain in effect (meaning this test can be used) for the duration of   the COVID-19 declaration under Section 564(b)(1) of the A ct, 21 U.S.C. section 360bbb-3(b)(1), unless the authorization is terminated or revoked sooner.     Influenza A by PCR NEGATIVE NEGATIVE Final   Influenza B by PCR NEGATIVE NEGATIVE Final    Comment: (NOTE) The Xpert Xpress SARS-CoV-2/FLU/RSV plus assay is intended as an aid in the diagnosis of influenza from Nasopharyngeal swab specimens and should not be used as a sole basis for treatment. Nasal washings and aspirates are unacceptable for Xpert Xpress SARS-CoV-2/FLU/RSV testing.  Fact Sheet for Patients: EntrepreneurPulse.com.au  Fact Sheet for Healthcare Providers: IncredibleEmployment.be  This test is not  yet approved or cleared by the [place] and has been authorized for detection and/or diagnosis of SARS-CoV-2 by FDA under an Emergency Use Authorization (EUA). This EUA will remain in effect (meaning this test can be used) for the duration of the COVID-19 declaration under Section 564(b)(1) of the Act, 21 U.S.C. section 360bbb-3(b)(1), unless the authorization is terminated or revoked.  Performed at [facility], [address] [place], [place] [address]   Blood Culture (routine x 2)     Status: None (Preliminary result)   Collection Time: 07/24/21  3:47 PM   Specimen: BLOOD  Result Value Ref Range Status   Specimen Description BLOOD LEFT ANTECUBITAL  Final   Special Requests   Final    BOTTLES DRAWN AEROBIC AND ANAEROBIC Blood Culture results may not be optimal due to an excessive volume of blood received in culture bottles   Culture   Final    NO GROWTH 2 DAYS Performed at [facility], [address], [place], [place] [address]    Report Status PENDING  Incomplete  Culture, blood (Routine X 2) w Reflex to ID Panel     Status: None (Preliminary result)   Collection Time: 07/25/21  1:58 PM   Specimen: BLOOD  Result Value Ref Range Status    Specimen Description BLOOD RIGHT ANTECUBITAL  Final   Special Requests   Final    BOTTLES DRAWN AEROBIC AND ANAEROBIC Blood Culture adequate volume   Culture   Final    NO GROWTH < 24 HOURS Performed at [facility], [address] [place], [place] [address]    Report Status PENDING  Incomplete     Labs: BNP (last 3 results) No results for input(s): "BNP" in the last 8760 hours. Basic Metabolic Panel: Recent Labs  Lab 07/24/21 1422 07/24/21 1547 07/25/21 0554 07/26/21 0535  NA 142 143 147* 140  K 3.4* 3.4* 3.6 3.5  CL 108 109 116* 104  CO2 18* _0 GLUCOSE 336* 308* 68* 102*  BUN _1 CREATININE 0.87 0.83 0.45 0.85  CALCIUM 8.2* 8.2* 8.2* 8.6*  MG  --   --  1.8 2.3  PHOS  --   --  3.1 5.3*   Liver Function Tests: Recent Labs  Lab 07/24/21 1547 07/25/21 0554 07/26/21 0535  AST 64* 32 33  ALT 82* 60* 69*  ALKPHOS 63 43 56  BILITOT 0.6 0.4 0.9  PROT 7.1 5.4* 7.0  ALBUMIN 3.1* 2.4* 3.3*   No results for input(s): "LIPASE", "AMYLASE" in the last 168 hours. No results for input(s): "AMMONIA" in the last 168 hours. CBC: Recent Labs  Lab 07/24/21 1422 07/25/21 0554 07/26/21 0535  WBC 16.5* 15.8* 16.3*  NEUTROABS  --  8.0* 6.7  HGB 14.1 12.3 14.4  HCT 45.4 39.2 44.5  MCV 89.0 88.5 86.1  PLT 249 241 260   Cardiac Enzymes: No results for input(s): "CKTOTAL", "CKMB", "CKMBINDEX", "TROPONINI" in the last 168 hours. BNP: Invalid input(s): "POCBNP" CBG: Recent Labs  Lab 07/25/21 1241 07/25/21 1643 07/25/21 2039 07/26/21 0733 07/26/21 1138  GLUCAP 99 116* 150* 103* 128*   D-Dimer No results for input(s): "DDIMER" in the last 72 hours. Hgb A1c No results for input(s): "HGBA1C" in the last 72 hours. Lipid Profile No results for input(s): "CHOL", "HDL", "LDLCALC", "TRIG", "CHOLHDL", "LDLDIRECT" in the last 72 hours. Thyroid function studies No results for input(s): "TSH", "T4TOTAL", "T3FREE", "THYROIDAB" in the last 72  hours.  Invalid input(s): "FREET3" Anemia work up No results for input(s): "VITAMINB12", "FOLATE", "FERRITIN", "TIBC", "IRON", "RETICCTPCT" in the last 72 hours. Urinalysis    Component Value Date/Time   COLORURINE COLORLESS (A) 07/24/2021 1547   APPEARANCEUR CLEAR (A) 07/24/2021 1547   LABSPEC 1.000 (L) 07/24/2021 1547   PHURINE 6.0 07/24/2021 1547   GLUCOSEU >=500 (A) 07/24/2021 1547   HGBUR SMALL (A) 07/24/2021 1547   BILIRUBINUR NEGATIVE 07/24/2021 1547   KETONESUR NEGATIVE 07/24/2021 1547   PROTEINUR NEGATIVE 07/24/2021 1547   NITRITE NEGATIVE 07/24/2021 1547   LEUKOCYTESUR NEGATIVE 07/24/2021 1547   Sepsis Labs Recent Labs  Lab 07/24/21 1422 07/25/21 0554 07/26/21 0535  WBC 16.5* 15.8* 16.3*   Microbiology Recent Results (from the past 240 hour(s))  Resp Panel by RT-PCR (Flu A&B, Covid) Anterior Nasal Swab     Status: Abnormal   Collection Time: 07/24/21  3:47 PM   Specimen: Anterior Nasal Swab  Result Value Ref Range Status   SARS Coronavirus 2 by RT PCR POSITIVE (A) NEGATIVE Final    Comment: (NOTE) SARS-CoV-2 target nucleic acids are DETECTED.  The SARS-CoV-2 RNA is generally detectable in upper respiratory specimens during the acute phase of infection. Positive results are indicative of the presence of the identified virus, but do not rule out bacterial infection or co-infection with other pathogens not detected by the test. Clinical correlation with patient history and other diagnostic information is necessary to determine patient infection status. The expected result is Negative.  Fact Sheet for Patients: EntrepreneurPulse.com.au  Fact Sheet for Healthcare Providers: IncredibleEmployment.be  This test is not yet approved or cleared by the [place] FDA and  has been authorized for detection and/or diagnosis of SARS-CoV-2 by FDA under an Emergency Use Authorization (EUA).  This EUA will remain in effect (meaning  this test can be used) for the duration of  the COVID-19 declaration under Section 564(b)(1) of the A ct, 21 U.S.C. section 360bbb-3(b)(1), unless the authorization is terminated or revoked sooner.     Influenza A by PCR NEGATIVE NEGATIVE Final   Influenza B by PCR NEGATIVE NEGATIVE Final    Comment: (NOTE) The Xpert Xpress SARS-CoV-2/FLU/RSV plus assay is intended as an aid in the diagnosis of influenza from Nasopharyngeal swab specimens and should not be used as a sole basis for treatment. Nasal washings and aspirates are unacceptable for Xpert Xpress SARS-CoV-2/FLU/RSV testing.  Fact Sheet for Patients: EntrepreneurPulse.com.au  Fact Sheet for Healthcare Providers: IncredibleEmployment.be  This test is not yet approved or cleared by the [place] FDA and has been authorized for detection and/or diagnosis of SARS-CoV-2 by FDA under an Emergency Use Authorization (EUA). This EUA will remain in effect (meaning this test can be used) for the duration of the COVID-19 declaration under Section 564(b)(1) of the Act, 21 U.S.C. section 360bbb-3(b)(1), unless the authorization is terminated or revoked.  Performed at [facility], [address] [place], [place] [address]   Blood Culture (routine x 2)     Status: None (Preliminary result)   Collection Time: 07/24/21  3:47 PM   Specimen: BLOOD  Result Value Ref Range Status   Specimen Description BLOOD LEFT ANTECUBITAL  Final   Special Requests   Final    BOTTLES DRAWN AEROBIC AND ANAEROBIC Blood Culture results may not be optimal due to an excessive volume of blood received in culture bottles   Culture   Final    NO GROWTH 2 DAYS Performed at [facility], [address] [place],  [place] [address]    Report Status PENDING  Incomplete  Culture, blood (Routine X 2) w Reflex to ID Panel     Status: None (Preliminary result)   Collection Time: 07/25/21  1:58 PM   Specimen:  BLOOD  Result Value Ref Range Status   Specimen Description BLOOD RIGHT ANTECUBITAL  Final   Special Requests   Final    BOTTLES DRAWN AEROBIC AND ANAEROBIC Blood Culture adequate volume   Culture   Final    NO GROWTH < 24 HOURS Performed at [facility], [address], [place], [place] [address]    Report Status PENDING  Incomplete   Imaging CT Angio Chest PE W and/or Wo Contrast  Result Date: 07/24/2021 CLINICAL DATA:  Shortness of breath for 3 weeks. Cough and congestion. Recent international travel. EXAM: CT ANGIOGRAPHY CHEST WITH CONTRAST TECHNIQUE: Multidetector CT imaging of the chest was performed using the standard protocol during bolus administration of intravenous contrast. Multiplanar CT image reconstructions and MIPs were obtained to evaluate the vascular anatomy. RADIATION DOSE REDUCTION: This exam was performed according to the departmental dose-optimization program which includes automated exposure control, adjustment of the mA and/or kV according to patient size and/or use of iterative reconstruction technique. CONTRAST:  27m OMNIPAQUE IOHEXOL 350 MG/ML SOLN COMPARISON:  01/17/2017 chest CT FINDINGS: Cardiovascular: No filling defect is identified in the pulmonary arterial tree to suggest pulmonary embolus. Atherosclerotic calcification of the thoracic aorta, left anterior descending, right, and circumflex coronary artery. Mild cardiomegaly. Mediastinum/Nodes: Unremarkable Lungs/Pleura: Scarring or atelectasis in the lingula and right middle lobe. Airway thickening is present, suggesting bronchitis or reactive airways disease. Airway plugging in both lower lobes. Upper Abdomen: Cholecystectomy. Musculoskeletal: Mild thoracic spondylosis. Review of the MIP images confirms the above findings. IMPRESSION: 1. No filling defect is identified in the pulmonary arterial tree to suggest pulmonary embolus. 2. Atherosclerosis including the thoracic aorta and coronary arteries. Aortic  Atherosclerosis (ICD10-I70.0). Mild cardiomegaly. 3. Airway thickening is present, suggesting bronchitis or reactive airways disease. Multifocal airway plugging in both lower lobes. Electronically Signed   By: WVan ClinesM.D.   On: 07/24/2021 16:59   DG Chest 2 View  Result Date: 07/24/2021 CLINICAL DATA:  A 71year old female presents for evaluation of increasing shortness of breath cough and congestion. EXAM: CHEST - 2 VIEW COMPARISON:  July 20, 2021. FINDINGS: Trachea midline. Cardiomediastinal contours and hilar structures are normal. Lungs are clear aside from subtle LEFT basilar airspace disease. There is no pneumothorax. No sign of pleural effusion. On limited assessment there is no acute skeletal process. IMPRESSION: Subtle LEFT basilar airspace disease likely atelectasis, difficult to exclude developing infection. Electronically Signed   By: GZetta BillsM.D.   On: 07/24/2021 15:00      Time coordinating discharge: Over 30 minutes  SIGNED:  NEmeterio ReeveDO [facility]

## 2021-07-28 ENCOUNTER — Telehealth: Payer: Self-pay

## 2021-07-28 NOTE — Telephone Encounter (Signed)
Called patient - Need updated DPR - LMOVM to contact office regarding [employer].

## 2021-07-29 LAB — CULTURE, BLOOD (ROUTINE X 2): Culture: NO GROWTH

## 2021-07-30 LAB — CULTURE, BLOOD (ROUTINE X 2)
Culture: NO GROWTH
Special Requests: ADEQUATE

## 2021-08-07 ENCOUNTER — Ambulatory Visit: Payer: Medicare Other | Admitting: Family Medicine

## 2021-08-08 ENCOUNTER — Encounter: Payer: Self-pay | Admitting: Family Medicine

## 2021-08-08 ENCOUNTER — Ambulatory Visit (INDEPENDENT_AMBULATORY_CARE_PROVIDER_SITE_OTHER): Payer: Medicare Other | Admitting: Family Medicine

## 2021-08-08 VITALS — BP 121/67 | HR 79 | Temp 98.5°F | Resp 16 | Wt 151.9 lb

## 2021-08-08 DIAGNOSIS — E119 Type 2 diabetes mellitus without complications: Secondary | ICD-10-CM | POA: Diagnosis not present

## 2021-08-08 DIAGNOSIS — U071 COVID-19: Secondary | ICD-10-CM

## 2021-08-08 MED ORDER — METFORMIN HCL ER 500 MG PO TB24
500.0000 mg | ORAL_TABLET | Freq: Every day | ORAL | 3 refills | Status: DC
Start: 2021-08-08 — End: 2022-05-17

## 2021-08-08 NOTE — Assessment & Plan Note (Addendum)
Improved though still with cough and fatigue 2 weeks out. S/p paxlovid. Lungs CTAB today. Much discussion regarding appropriate use of inhalers, encouraged only prn use of albuterol. Recommend daily dulera however is not interested in maintenance inhaler. Encouraged follow up with pulmonologist soon. Lactic acidosis resolved, restart metformin. Finished paxlovid, restart statin. F/u in 3 months to reassess lung function.

## 2021-08-08 NOTE — Progress Notes (Signed)
   SUBJECTIVE:   CHIEF COMPLAINT / HPI:   HOSPITAL FOLLOW UP Hospital/facility: [facility] 7/10-7/12 Diagnosis: COVID-19 sepsis Procedures/tests:  - COVID+ - Bcx NGTD Consultants:  New medications:  - held metformin - held statin - hycodan syrup, tessalon, paxlovid, zofran Discharge instructions:   Status: better - finished paxlovid.  - taking albuterol twice daily scheduled. Not using dulera, doesn't want to take daily inhaler. - still getting SOB with walking - not using cough medicine or zofran.  - Last saw Pulm 7/6, thought may have asthmatic bronchitis with probably allergic component though spirometry at that time was near normal.  OBJECTIVE:   BP 121/67 (BP Location: Left Arm, Patient Position: Sitting, Cuff Size: Normal)   Pulse 79   Temp 98.5 F (36.9 C) (Oral)   Resp 16   Wt 151 lb 14.4 oz (68.9 kg)   BMI 28.70 kg/m   Gen: well appearing, in NAD Card: RRR Lungs: CTAB, no wheeze/rales/rhonchi. Occasional dry cough. Ext: WWP, no edema   ASSESSMENT/PLAN:   Diabetes mellitus without complication (HCC) Recheck a1c today.  COVID-19 Improved though still with cough and fatigue 2 weeks out. S/p paxlovid. Lungs CTAB today. Much discussion regarding appropriate use of inhalers, encouraged only prn use of albuterol. Recommend daily dulera however is not interested in maintenance inhaler. Encouraged follow up with pulmonologist soon. Lactic acidosis resolved, restart metformin. Finished paxlovid, restart statin. F/u in 3 months to reassess lung function.      Julia Laroche, DO

## 2021-08-08 NOTE — Assessment & Plan Note (Signed)
Recheck a1c today. 

## 2021-08-09 LAB — CBC WITH DIFFERENTIAL/PLATELET
Basophils Absolute: 0.1 10*3/uL (ref 0.0–0.2)
Basos: 1 %
EOS (ABSOLUTE): 0.5 10*3/uL — ABNORMAL HIGH (ref 0.0–0.4)
Eos: 6 %
Hematocrit: 40.7 % (ref 34.0–46.6)
Hemoglobin: 13.5 g/dL (ref 11.1–15.9)
Immature Grans (Abs): 0 10*3/uL (ref 0.0–0.1)
Immature Granulocytes: 0 %
Lymphocytes Absolute: 4.1 10*3/uL — ABNORMAL HIGH (ref 0.7–3.1)
Lymphs: 48 %
MCH: 28.5 pg (ref 26.6–33.0)
MCHC: 33.2 g/dL (ref 31.5–35.7)
MCV: 86 fL (ref 79–97)
Monocytes Absolute: 0.6 10*3/uL (ref 0.1–0.9)
Monocytes: 7 %
Neutrophils Absolute: 3.3 10*3/uL (ref 1.4–7.0)
Neutrophils: 38 %
Platelets: 180 10*3/uL (ref 150–450)
RBC: 4.73 x10E6/uL (ref 3.77–5.28)
RDW: 13.3 % (ref 11.7–15.4)
WBC: 8.6 10*3/uL (ref 3.4–10.8)

## 2021-08-09 LAB — COMPREHENSIVE METABOLIC PANEL
ALT: 30 IU/L (ref 0–32)
AST: 28 IU/L (ref 0–40)
Albumin/Globulin Ratio: 1.1 — ABNORMAL LOW (ref 1.2–2.2)
Albumin: 3.7 g/dL — ABNORMAL LOW (ref 3.9–4.9)
Alkaline Phosphatase: 62 IU/L (ref 44–121)
BUN/Creatinine Ratio: 16 (ref 12–28)
BUN: 10 mg/dL (ref 8–27)
Bilirubin Total: <0.2 mg/dL (ref 0.0–1.2)
CO2: 22 mmol/L (ref 20–29)
Calcium: 9.3 mg/dL (ref 8.7–10.3)
Chloride: 102 mmol/L (ref 96–106)
Creatinine, Ser: 0.64 mg/dL (ref 0.57–1.00)
Globulin, Total: 3.4 g/dL (ref 1.5–4.5)
Glucose: 92 mg/dL (ref 70–99)
Potassium: 3.9 mmol/L (ref 3.5–5.2)
Sodium: 140 mmol/L (ref 134–144)
Total Protein: 7.1 g/dL (ref 6.0–8.5)
eGFR: 96 mL/min/{1.73_m2} (ref >59)

## 2021-08-09 LAB — HEMOGLOBIN A1C
Est. average glucose Bld gHb Est-mCnc: 134 mg/dL
Hgb A1c MFr Bld: 6.3 % — ABNORMAL HIGH (ref 4.8–5.6)

## 2021-08-15 ENCOUNTER — Ambulatory Visit: Payer: Medicare Other | Admitting: Family Medicine

## 2021-09-27 ENCOUNTER — Inpatient Hospital Stay: Payer: Medicare Other | Attending: Oncology

## 2021-09-27 DIAGNOSIS — D693 Immune thrombocytopenic purpura: Secondary | ICD-10-CM | POA: Insufficient documentation

## 2021-09-27 DIAGNOSIS — D696 Thrombocytopenia, unspecified: Secondary | ICD-10-CM

## 2021-09-27 LAB — CBC WITH DIFFERENTIAL/PLATELET
Abs Immature Granulocytes: 0.02 10*3/uL (ref 0.00–0.07)
Basophils Absolute: 0 10*3/uL (ref 0.0–0.1)
Basophils Relative: 0 %
Eosinophils Absolute: 0.5 10*3/uL (ref 0.0–0.5)
Eosinophils Relative: 6 %
HCT: 42.8 % (ref 36.0–46.0)
Hemoglobin: 14.1 g/dL (ref 12.0–15.0)
Immature Granulocytes: 0 %
Lymphocytes Relative: 47 %
Lymphs Abs: 4.3 10*3/uL — ABNORMAL HIGH (ref 0.7–4.0)
MCH: 28.4 pg (ref 26.0–34.0)
MCHC: 32.9 g/dL (ref 30.0–36.0)
MCV: 86.3 fL (ref 80.0–100.0)
Monocytes Absolute: 0.6 10*3/uL (ref 0.1–1.0)
Monocytes Relative: 7 %
Neutro Abs: 3.6 10*3/uL (ref 1.7–7.7)
Neutrophils Relative %: 40 %
Platelets: 140 10*3/uL — ABNORMAL LOW (ref 150–400)
RBC: 4.96 MIL/uL (ref 3.87–5.11)
RDW: 13.2 % (ref 11.5–15.5)
WBC: 9 10*3/uL (ref 4.0–10.5)
nRBC: 0 % (ref 0.0–0.2)

## 2021-10-26 ENCOUNTER — Telehealth: Payer: Self-pay | Admitting: Family Medicine

## 2021-10-26 NOTE — Telephone Encounter (Signed)
Need to schedule in office visit to evaluate

## 2021-10-26 NOTE — Telephone Encounter (Signed)
Patient was told by her dermatologist to contact her PCP about the cyst on her neck that is hurting.

## 2021-10-27 NOTE — Telephone Encounter (Signed)
Appt made with Dr. Caryn Section 11/10/21 at 10:00 a.m.

## 2021-11-02 ENCOUNTER — Telehealth: Payer: Self-pay

## 2021-11-02 NOTE — Telephone Encounter (Signed)
Copied from [place] [id number]. Topic: Appointment Scheduling - Scheduling Inquiry for Clinic >> Nov 02, 2021 11:51 AM Sabas Sous wrote: Reason for CRM: Pt wants to reschedule her AWV to Friday 11/10/2021 in the morning either before or after her appt with Dr. Caryn Section at 10:00 am.   Best contact: [phone]

## 2021-11-07 DIAGNOSIS — G5601 Carpal tunnel syndrome, right upper limb: Secondary | ICD-10-CM | POA: Diagnosis not present

## 2021-11-08 ENCOUNTER — Other Ambulatory Visit: Payer: Self-pay | Admitting: Family Medicine

## 2021-11-08 ENCOUNTER — Ambulatory Visit (INDEPENDENT_AMBULATORY_CARE_PROVIDER_SITE_OTHER): Payer: Medicare Other

## 2021-11-08 VITALS — Ht 61.0 in | Wt 151.2 lb

## 2021-11-08 DIAGNOSIS — Z0000 Encounter for general adult medical examination without abnormal findings: Secondary | ICD-10-CM

## 2021-11-08 NOTE — Patient Instructions (Signed)
Julia Garner , Thank you for taking time to come for your Medicare Wellness Visit. I appreciate your ongoing commitment to your health goals. Please review the following plan we discussed and let me know if I can assist you in the future.   Screening recommendations/referrals: Colonoscopy: 12/20/20 Mammogram: declined referral Bone Density: 12/23/18 Recommended yearly ophthalmology/optometry visit for glaucoma screening and checkup Recommended yearly dental visit for hygiene and checkup  Vaccinations: Influenza vaccine: 11/09/20 Pneumococcal vaccine: 11/04/18 Tdap vaccine: 05/08/11, due if have injury Shingles vaccine: had Shingrix per pt   Covid-19:02/13/19, 03/11/19  Advanced directives: yes  Conditions/risks identified: none  Next appointment: Follow up in one year for your annual wellness visit 11/12/22 @ 9:15 am in person   Preventive Care 23 Years and Older, Female Preventive care refers to lifestyle choices and visits with your health care provider that can promote health and wellness. What does preventive care include? A yearly physical exam. This is also called an annual well check. Dental exams once or twice a year. Routine eye exams. Ask your health care provider how often you should have your eyes checked. Personal lifestyle choices, including: Daily care of your teeth and gums. Regular physical activity. Eating a healthy diet. Avoiding tobacco and drug use. Limiting alcohol use. Practicing safe sex. Taking low-dose aspirin every day. Taking vitamin and mineral supplements as recommended by your health care provider. What happens during an annual well check? The services and screenings done by your health care provider during your annual well check will depend on your age, overall health, lifestyle risk factors, and family history of disease. Counseling  Your health care provider may ask you questions about your: Alcohol use. Tobacco use. Drug use. Emotional  well-being. Home and relationship well-being. Sexual activity. Eating habits. History of falls. Memory and ability to understand (cognition). Work and work Statistician. Reproductive health. Screening  You may have the following tests or measurements: Height, weight, and BMI. Blood pressure. Lipid and cholesterol levels. These may be checked every 5 years, or more frequently if you are over 28 years old. Skin check. Lung cancer screening. You may have this screening every year starting at age 58 if you have a 30-pack-year history of smoking and currently smoke or have quit within the past 15 years. Fecal occult blood test (FOBT) of the stool. You may have this test every year starting at age 15. Flexible sigmoidoscopy or colonoscopy. You may have a sigmoidoscopy every 5 years or a colonoscopy every 10 years starting at age 62. Hepatitis C blood test. Hepatitis B blood test. Sexually transmitted disease (STD) testing. Diabetes screening. This is done by checking your blood sugar (glucose) after you have not eaten for a while (fasting). You may have this done every 1-3 years. Bone density scan. This is done to screen for osteoporosis. You may have this done starting at age 49. Mammogram. This may be done every 1-2 years. Talk to your health care provider about how often you should have regular mammograms. Talk with your health care provider about your test results, treatment options, and if necessary, the need for more tests. Vaccines  Your health care provider may recommend certain vaccines, such as: Influenza vaccine. This is recommended every year. Tetanus, diphtheria, and acellular pertussis (Tdap, Td) vaccine. You may need a Td booster every 10 years. Zoster vaccine. You may need this after age 78. Pneumococcal 13-valent conjugate (PCV13) vaccine. One dose is recommended after age 4. Pneumococcal polysaccharide (PPSV23) vaccine. One dose is recommended after  age 2. Talk to your  health care provider about which screenings and vaccines you need and how often you need them. This information is not intended to replace advice given to you by your health care provider. Make sure you discuss any questions you have with your health care provider. Document Released: 01/28/2015 Document Revised: 09/21/2015 Document Reviewed: 11/02/2014 Elsevier Interactive Patient Education  2017 [place] Prevention in the Home Falls can cause injuries. They can happen to people of all ages. There are many things you can do to make your home safe and to help prevent falls. What can I do on the outside of my home? Regularly fix the edges of walkways and driveways and fix any cracks. Remove anything that might make you trip as you walk through a door, such as a raised step or threshold. Trim any bushes or trees on the path to your home. Use bright outdoor lighting. Clear any walking paths of anything that might make someone trip, such as rocks or tools. Regularly check to see if handrails are loose or broken. Make sure that both sides of any steps have handrails. Any raised decks and porches should have guardrails on the edges. Have any leaves, snow, or ice cleared regularly. Use sand or salt on walking paths during winter. Clean up any spills in your garage right away. This includes oil or grease spills. What can I do in the bathroom? Use night lights. Install grab bars by the toilet and in the tub and shower. Do not use towel bars as grab bars. Use non-skid mats or decals in the tub or shower. If you need to sit down in the shower, use a plastic, non-slip stool. Keep the floor dry. Clean up any water that spills on the floor as soon as it happens. Remove soap buildup in the tub or shower regularly. Attach bath mats securely with double-sided non-slip rug tape. Do not have throw rugs and other things on the floor that can make you trip. What can I do in the bedroom? Use night  lights. Make sure that you have a light by your bed that is easy to reach. Do not use any sheets or blankets that are too big for your bed. They should not hang down onto the floor. Have a firm chair that has side arms. You can use this for support while you get dressed. Do not have throw rugs and other things on the floor that can make you trip. What can I do in the kitchen? Clean up any spills right away. Avoid walking on wet floors. Keep items that you use a lot in easy-to-reach places. If you need to reach something above you, use a strong step stool that has a grab bar. Keep electrical cords out of the way. Do not use floor polish or wax that makes floors slippery. If you must use wax, use non-skid floor wax. Do not have throw rugs and other things on the floor that can make you trip. What can I do with my stairs? Do not leave any items on the stairs. Make sure that there are handrails on both sides of the stairs and use them. Fix handrails that are broken or loose. Make sure that handrails are as long as the stairways. Check any carpeting to make sure that it is firmly attached to the stairs. Fix any carpet that is loose or worn. Avoid having throw rugs at the top or bottom of the stairs. If you do  have throw rugs, attach them to the floor with carpet tape. Make sure that you have a light switch at the top of the stairs and the bottom of the stairs. If you do not have them, ask someone to add them for you. What else can I do to help prevent falls? Wear shoes that: Do not have high heels. Have rubber bottoms. Are comfortable and fit you well. Are closed at the toe. Do not wear sandals. If you use a stepladder: Make sure that it is fully opened. Do not climb a closed stepladder. Make sure that both sides of the stepladder are locked into place. Ask someone to hold it for you, if possible. Clearly mark and make sure that you can see: Any grab bars or handrails. First and last  steps. Where the edge of each step is. Use tools that help you move around (mobility aids) if they are needed. These include: Canes. Walkers. Scooters. Crutches. Turn on the lights when you go into a dark area. Replace any light bulbs as soon as they burn out. Set up your furniture so you have a clear path. Avoid moving your furniture around. If any of your floors are uneven, fix them. If there are any pets around you, be aware of where they are. Review your medicines with your doctor. Some medicines can make you feel dizzy. This can increase your chance of falling. Ask your doctor what other things that you can do to help prevent falls. This information is not intended to replace advice given to you by your health care provider. Make sure you discuss any questions you have with your health care provider. Document Released: 10/28/2008 Document Revised: 06/09/2015 Document Reviewed: 02/05/2014 Elsevier Interactive Patient Education  2017 [employer].

## 2021-11-08 NOTE — Progress Notes (Signed)
Subjective:   Julia Garner is a 71 y.o. female who presents for Medicare Annual (Subsequent) preventive examination.  Review of Systems     Cardiac Risk Factors include: advanced age (>50mn, >>30women);diabetes mellitus;dyslipidemia     Objective:    Today's Vitals   11/08/21 1406  Weight: 151 lb 3.2 oz (68.6 kg)  Height: 5' 1"  (1.549 m)   Body mass index is 28.57 kg/m.     11/08/2021    2:15 PM 07/25/2021    7:00 PM 07/24/2021    2:20 PM 06/27/2021   10:06 AM 12/20/2020    9:27 AM 01/13/2020    9:54 AM 07/14/2019   10:01 AM  Advanced Directives  Does Patient Have a Medical Advance Directive? Yes Yes No Yes Yes No No  Type of [place]of [place] will Living will   HDaviessLiving will    Does patient want to make changes to medical advance directive? No - Patient declined No - Guardian declined       Copy of [place]? Yes - validated most recent copy scanned in chart (See row information)        Would patient like information on creating a medical advance directive?      No - Patient declined No - Patient declined    Current Medications (verified) Outpatient Encounter Medications as of 11/08/2021  Medication Sig   ascorbic acid (VITAMIN C) 500 MG tablet Take 500 mg by mouth daily.   Blood Glucose Monitoring Suppl (ONE TOUCH ULTRA 2) w/Device KIT Use to check sugar daily for type 2 diabetes E11.9   cetirizine (ZYRTEC) 10 MG tablet Take 20 mg by mouth at bedtime as needed.    famotidine (PEPCID) 40 MG tablet Take 40 mg by mouth daily as needed.    glucosamine-chondroitin 500-400 MG tablet Take 1 tablet by mouth 3 (three) times daily.   glucose blood (ONETOUCH ULTRA) test strip Use to check sugar twice daily for type 2 diabetes E11.9   irbesartan (AVAPRO) 75 MG tablet TAKE 1 TABLET BY MOUTH DAILY   Lancets (ONETOUCH ULTRASOFT) lancets Use to check sugar twice daily for type 2 diabetes  E11.9   metFORMIN (GLUCOPHAGE-XR) 500 MG 24 hr tablet Take 1 tablet (500 mg total) by mouth daily with breakfast.   Multiple Vitamin (MULTIVITAMIN) tablet Take 1 tablet by mouth daily.   Naproxen Sodium 220 MG CAPS Take by mouth daily as needed.   omalizumab (Arvid Right 150 MG/ML prefilled syringe Inject 300 mg into the skin every 14 (fourteen) days.   Omega 3 1200 MG CAPS Take 2 capsules by mouth daily.   sertraline (ZOLOFT) 50 MG tablet TAKE 2 TABLETS BY MOUTH  DAILY   simvastatin (ZOCOR) 40 MG tablet Take 40 mg by mouth at bedtime.   traZODone (DESYREL) 50 MG tablet Take 1-2 tablets (50-100 mg total) by mouth at bedtime.   Ubiquinol 200 MG CAPS Take by mouth. CoQ10   albuterol (VENTOLIN HFA) 108 (90 Base) MCG/ACT inhaler Inhale 2 puffs into the lungs every 6 (six) hours as needed for wheezing or shortness of breath. (Patient not taking: Reported on 11/08/2021)   predniSONE (STERAPRED UNI-PAK 48 TAB) 10 MG (48) TBPK tablet Take 2 tablets by mouth 2 (two) times daily. (Patient not taking: Reported on 11/08/2021)   [DISCONTINUED] mometasone-formoterol (DULERA) 100-5 MCG/ACT AERO Inhale 2 puffs into the lungs 2 (two) times daily. (Patient not taking: Reported on 11/08/2021)   [DISCONTINUED]  ondansetron (ZOFRAN) 4 MG tablet Take 1 tablet (4 mg total) by mouth every 6 (six) hours as needed for nausea. (Patient not taking: Reported on 11/08/2021)   [DISCONTINUED] sertraline (ZOLOFT) 50 MG tablet TAKE 2 TABLETS BY MOUTH  DAILY   Facility-Administered Encounter Medications as of 11/08/2021  Medication   omalizumab Arvid Right) injection 300 mg    Allergies (verified) Niacin and related, Penicillins, Statins, and Tylenol  [acetaminophen]   History: Past Medical History:  Diagnosis Date   Allergy    Chronic ITP (idiopathic thrombocytopenia) (HCC)    Chronic urticaria 08/15/2018   Depression    Head injuries    History of seizure 2007   one seizure    Hypertension    Past Surgical History:   Procedure Laterality Date   CATARACT EXTRACTION, BILATERAL  around 2021   Dr. Ellin Mayhew   CESAREAN SECTION  1990   CHOLECYSTECTOMY  2005   COLONOSCOPY WITH PROPOFOL N/A 12/20/2020   Procedure: COLONOSCOPY WITH PROPOFOL;  Surgeon: Lucilla Lame, MD;  Location: [facility] ENDOSCOPY;  Service: Endoscopy;  Laterality: N/A;   CYST EXCISION  1996   Tracheal Cyst   Family History  Problem Relation Age of Onset   Heart attack Father        1st MI at age 60   Social History   Socioeconomic History   Marital status: Married    Spouse name: Not on file   Number of children: 3   Years of education: Not on file   Highest education level: Not on file  Occupational History   Occupation: retired    Comment: Physical Therapist  Tobacco Use   Smoking status: Never   Smokeless tobacco: Never  Vaping Use   Vaping Use: Never used  Substance and Sexual Activity   Alcohol use: No    Alcohol/week: 0.0 standard drinks of alcohol   Drug use: No   Sexual activity: Not on file  Other Topics Concern   Not on file  Social History Narrative   Not on file   Social Determinants of Health   Financial Resource Strain: [place]  (11/08/2021)   Overall Financial Resource Strain (CARDIA)    Difficulty of Paying Living Expenses: Not hard at all  Food Insecurity: No Food Insecurity (11/08/2021)   Hunger Vital Sign    Worried About Running Out of Food in the Last Year: Never true    [place] in the Last Year: Never true  Transportation Needs: No Transportation Needs (11/08/2021)   PRAPARE - Hydrologist (Medical): No    Lack of Transportation (Non-Medical): No  Physical Activity: Inactive (11/08/2021)   Exercise Vital Sign    Days of Exercise per Week: 0 days    Minutes of Exercise per Session: 0 min  Stress: Stress Concern Present (11/08/2021)   [place]    Feeling of Stress : To some extent  Social  Connections: Moderately Integrated (11/08/2021)   Social Connection and Isolation Panel [NHANES]    Frequency of Communication with Friends and Family: More than three times a week    Frequency of Social Gatherings with Friends and Family: More than three times a week    Attends Religious Services: More than 4 times per year    Active Member of [employer] or Organizations: No    Attends Archivist Meetings: Never    Marital Status: Married    Tobacco Counseling Counseling given: Not  Answered   Clinical Intake:  Pre-visit preparation completed: Yes  Pain : No/denies pain     Diabetes: Yes CBG done?: No Did pt. bring in CBG monitor from home?: No  How often do you need to have someone help you when you read instructions, pamphlets, or other written materials from your doctor or pharmacy?: 1 - Never  Diabetic?yes Nutrition Risk Assessment:  Has the patient had any N/V/D within the last 2 months?  No  Does the patient have any non-healing wounds?  No  Has the patient had any unintentional weight loss or weight gain?  No   Diabetes:  Is the patient diabetic?  Yes  If diabetic, was a CBG obtained today?  No  Did the patient bring in their glucometer from home?  No  How often do you monitor your CBG's? 1-3xs day.   Financial Strains and Diabetes Management:  Are you having any financial strains with the device, your supplies or your medication? No .  Does the patient want to be seen by Chronic Care Management for management of their diabetes?  No  Would the patient like to be referred to a Nutritionist or for Diabetic Management?  No   Diabetic Exams:  Diabetic Eye Exam: Completed 04/30/20. Overdue for diabetic eye exam. Pt has been advised about the importance in completing this exam.   Diabetic Foot Exam: Completed no. Pt has been advised about the importance in completing this exam.   Interpreter Needed?: No  Information entered by :: Kirke Shaggy,  LPN   Activities of Daily Living    11/08/2021    2:17 PM 08/08/2021    1:30 PM  In your present state of health, do you have any difficulty performing the following activities:  Hearing? 0 1  Vision? 0 0  Difficulty concentrating or making decisions? 0 0  Walking or climbing stairs? 0 0  Dressing or bathing? 0 0  Doing errands, shopping? 0 0  Preparing Food and eating ? N   Using the Toilet? N   In the past six months, have you accidently leaked urine? N   Do you have problems with loss of bowel control? N   Managing your Medications? N   Managing your Finances? N   Housekeeping or managing your Housekeeping? N     Patient Care Team: Birdie Sons, MD as PCP - General (Family Medicine) Anell Barr, OD (Optometry)  Indicate any recent Medical Services you may have received from other than [facility] providers in the past year (date may be approximate).     Assessment:   This is a routine wellness examination for [employer].  Hearing/Vision screen Hearing Screening - Comments:: No aids Vision Screening - Comments:: - Dr.Woodard  Dietary issues and exercise activities discussed: Current Exercise Habits: The patient does not participate in regular exercise at present   Goals Addressed             This Visit's Progress    DIET - EAT MORE FRUITS AND VEGETABLES         Depression Screen    11/08/2021    2:13 PM 08/08/2021    1:29 PM 03/14/2021    9:50 AM 11/09/2020    9:50 AM 10/26/2019    8:52 AM 11/04/2018    2:06 PM  PHQ 2/9 Scores  PHQ - 2 Score 0  0 0 0 1  PHQ- 9 Score 0  0 0  4  Exception Documentation  Patient refusal  Fall Risk    11/08/2021    2:16 PM 08/08/2021    1:29 PM 03/14/2021    9:50 AM 11/09/2020    9:50 AM 10/26/2019    8:52 AM  Fall Risk   Falls in the past year? 1 0 0 0 0  Number falls in past yr: 1 0 0 0 0  Injury with Fall? 0 0 0 0 0  Risk for fall due to : History of fall(s)  No Fall Risks No Fall Risks   Follow up  Falls prevention discussed;Falls evaluation completed  Falls evaluation completed Falls evaluation completed     FALL RISK PREVENTION PERTAINING TO THE HOME:  Any stairs in or around the home? Yes  If so, are there any without handrails? No  Home free of loose throw rugs in walkways, pet beds, electrical cords, etc? Yes  Adequate lighting in your home to reduce risk of falls? Yes   ASSISTIVE DEVICES UTILIZED TO PREVENT FALLS:  Life alert? No  Use of a cane, walker or w/c? No  Grab bars in the bathroom? No  Shower chair or bench in shower? Yes  Elevated toilet seat or a handicapped toilet? No   TIMED UP AND GO:  Was the test performed? Yes .  Length of time to ambulate 10 feet: 4 sec.   Gait steady and fast without use of assistive device  Cognitive Function:        11/08/2021    2:23 PM  6CIT Screen  What Year? 0 points  What month? 0 points  What time? 0 points  Count back from 20 0 points  Months in reverse 0 points  Repeat phrase 0 points  Total Score 0 points    Immunizations Immunization History  Administered Date(s) Administered   Fluad Quad(high Dose 65+) 11/04/2018, 11/05/2019, 11/09/2020   Hepatitis B, adult 09/24/2017, 10/30/2017, 11/04/2018   Influenza, High Dose Seasonal PF 10/30/2017   Influenza,inj,Quad PF,6+ Mos 12/22/2014, 12/21/2015, 12/27/2016   Moderna Sars-Covid-2 Vaccination 02/13/2019, 03/11/2019   Pneumococcal Conjugate-13 10/30/2017   Pneumococcal Polysaccharide-23 11/04/2018   Td 05/26/1993   Tdap 05/08/2011    TDAP status: Due, Education has been provided regarding the importance of this vaccine. Advised may receive this vaccine at local pharmacy or Health Dept. Aware to provide a copy of the vaccination record if obtained from local pharmacy or Health Dept. Verbalized acceptance and understanding.  Flu Vaccine status: Up to date  Pneumococcal vaccine status: Up to date  Covid-19 vaccine status: Completed vaccines  Qualifies for  Shingles Vaccine? Yes   Zostavax completed No   Shingrix Completed?: No.    Education has been provided regarding the importance of this vaccine. Patient has been advised to call insurance company to determine out of pocket expense if they have not yet received this vaccine. Advised may also receive vaccine at local pharmacy or Health Dept. Verbalized acceptance and understanding.  Screening Tests Health Maintenance  Topic Date Due   FOOT EXAM  Never done   Diabetic kidney evaluation - Urine ACR  Never done   Zoster Vaccines- Shingrix (1 of 2) Never done   MAMMOGRAM  Never done   COVID-19 Vaccine (3 - Moderna risk series) 04/08/2019   OPHTHALMOLOGY EXAM  04/30/2021   TETANUS/TDAP  05/07/2021   INFLUENZA VACCINE  08/15/2021   COLON CANCER SCREENING ANNUAL FOBT  11/14/2021   DEXA SCAN  12/22/2021   HEMOGLOBIN A1C  02/08/2022   Diabetic kidney evaluation - GFR measurement  08/09/2022  Medicare Annual Wellness (AWV)  12/09/2022   COLONOSCOPY (Pts 45-24yr Insurance coverage will need to be confirmed)  12/21/2030   Pneumonia Vaccine 71 Years old  Completed   Hepatitis C Screening  Completed   HPV VACCINES  Aged Out    Health Maintenance  Health Maintenance Due  Topic Date Due   FOOT EXAM  Never done   Diabetic kidney evaluation - Urine ACR  Never done   Zoster Vaccines- Shingrix (1 of 2) Never done   MAMMOGRAM  Never done   COVID-19 Vaccine (3 - Moderna risk series) 04/08/2019   OPHTHALMOLOGY EXAM  04/30/2021   TETANUS/TDAP  05/07/2021   INFLUENZA VACCINE  08/15/2021    Colorectal cancer screening: Type of screening: Colonoscopy. Completed 12/20/20. Repeat every 10 years  Declined referral for mammogram  Bone Density status: Completed 12/23/18. Results reflect: Bone density results: OSTEOPENIA. Repeat every 3 years.  Lung Cancer Screening: (Low Dose CT Chest recommended if Age 69-80years, 30 pack-year currently smoking OR have quit w/in 15years.) does not qualify.    Additional Screening:  Hepatitis C Screening: does qualify; Completed 09/10/16  Vision Screening: Recommended annual ophthalmology exams for early detection of glaucoma and other disorders of the eye. Is the patient up to date with their annual eye exam?  Yes  Who is the provider or what is the name of the office in which the patient attends annual eye exams? Dr.Woodard If pt is not established with a provider, would they like to be referred to a provider to establish care? No .   Dental Screening: Recommended annual dental exams for proper oral hygiene  Community Resource Referral / Chronic Care Management: CRR required this visit?  No   CCM required this visit?  No      Plan:     I have personally reviewed and noted the following in the patient's chart:   Medical and social history Use of alcohol, tobacco or illicit drugs  Current medications and supplements including opioid prescriptions. Patient is not currently taking opioid prescriptions. Functional ability and status Nutritional status Physical activity Advanced directives List of other physicians Hospitalizations, surgeries, and ER visits in previous 12 months Vitals Screenings to include cognitive, depression, and falls Referrals and appointments  In addition, I have reviewed and discussed with patient certain preventive protocols, quality metrics, and best practice recommendations. A written personalized care plan for preventive services as well as general preventive health recommendations were provided to patient.     LDionisio David LPN   [id number]  Nurse Notes: none

## 2021-11-09 NOTE — Progress Notes (Unsigned)
I,Roshena L Chambers,acting as a scribe for Lelon Huh, MD.,have documented all relevant documentation on the behalf of Lelon Huh, MD,as directed by  Lelon Huh, MD while in the presence of Lelon Huh, MD.     Established patient visit   Patient: Julia Garner   DOB: [birth year]   71 y.o. Female  MRN: [id number] Visit Date: 11/10/2021  Today's healthcare provider: Lelon Huh, MD   Chief Complaint  Patient presents with   Mass   Subjective    HPI  Mass: Patient is here for an evaluation of a neck mass. The mass is located on the left anterior side of her neck. She reports, the mass has been there for several years, but within the past 2 months has started to have sharp shooting pains. She denies any neck swelling, dysphagia or difficulty breathing.   Medications: Outpatient Medications Prior to Visit  Medication Sig   ascorbic acid (VITAMIN C) 500 MG tablet Take 500 mg by mouth daily.   Blood Glucose Monitoring Suppl (ONE TOUCH ULTRA 2) w/Device KIT Use to check sugar daily for type 2 diabetes E11.9   cetirizine (ZYRTEC) 10 MG tablet Take 20 mg by mouth at bedtime as needed.    famotidine (PEPCID) 40 MG tablet Take 40 mg by mouth daily as needed.    glucosamine-chondroitin 500-400 MG tablet Take 1 tablet by mouth 3 (three) times daily.   glucose blood (ONETOUCH ULTRA) test strip Use to check sugar twice daily for type 2 diabetes E11.9   irbesartan (AVAPRO) 75 MG tablet TAKE 1 TABLET BY MOUTH DAILY   Lancets (ONETOUCH ULTRASOFT) lancets Use to check sugar twice daily for type 2 diabetes E11.9   metFORMIN (GLUCOPHAGE-XR) 500 MG 24 hr tablet Take 1 tablet (500 mg total) by mouth daily with breakfast.   Multiple Vitamin (MULTIVITAMIN) tablet Take 1 tablet by mouth daily.   Naproxen Sodium 220 MG CAPS Take by mouth daily as needed.   omalizumab Arvid Right) 150 MG/ML prefilled syringe Inject 300 mg into the skin every 14 (fourteen) days.   Omega 3 1200 MG CAPS Take  2 capsules by mouth daily.   sertraline (ZOLOFT) 50 MG tablet TAKE 2 TABLETS BY MOUTH  DAILY   simvastatin (ZOCOR) 40 MG tablet Take 40 mg by mouth at bedtime.   traZODone (DESYREL) 50 MG tablet Take 1-2 tablets (50-100 mg total) by mouth at bedtime.   Ubiquinol 200 MG CAPS Take by mouth. CoQ10   albuterol (VENTOLIN HFA) 108 (90 Base) MCG/ACT inhaler Inhale 2 puffs into the lungs every 6 (six) hours as needed for wheezing or shortness of breath. (Patient not taking: Reported on 11/10/2021)   [DISCONTINUED] predniSONE (STERAPRED UNI-PAK 48 TAB) 10 MG (48) TBPK tablet Take 2 tablets by mouth 2 (two) times daily. (Patient not taking: Reported on 11/10/2021)   Facility-Administered Medications Prior to Visit  Medication Dose Route Frequency Provider   omalizumab Arvid Right) injection 300 mg  300 mg Subcutaneous Q28 days Valentina Shaggy, MD    Review of Systems  Constitutional:  Negative for appetite change, chills, fatigue and fever.  Respiratory:  Negative for chest tightness and shortness of breath.   Cardiovascular:  Negative for chest pain and palpitations.  Gastrointestinal:  Negative for abdominal pain, nausea and vomiting.  Musculoskeletal:        Neck mass  Neurological:  Negative for dizziness and weakness.    {Labs  Heme  Chem  Endocrine  Serology  Results Review (optional):23779}  Objective    BP 138/88 (BP Location: Right Arm, Patient Position: Sitting, Cuff Size: Normal)   Pulse 100   Temp 98 F (36.7 C) (Oral)   Resp 16   Wt 152 lb (68.9 kg)   SpO2 (!) 65%   BMI 28.72 kg/m  {Show previous vital signs (optional):23777}  Physical Exam   About 1cm freely mobile non-tender soft, solid mass in the skin of left side of neck c/w sebaceous cyst. No erythema, no drainage.   Assessment & Plan     1. Sebaceous cyst Has been present for many years but has been causing intermittent stinging pains the last couple of months. Recommend evaluation by dermatology to  consider excision.   - Ambulatory referral to Dermatology   Flu vaccine given today      The entirety of the information documented in the History of Present Illness, Review of Systems and Physical Exam were personally obtained by me. Portions of this information were initially documented by the CMA and reviewed by me for thoroughness and accuracy.     Lelon Huh, MD  [facility] [phone] (phone) [phone] (fax)  [place]

## 2021-11-10 ENCOUNTER — Encounter: Payer: Self-pay | Admitting: Family Medicine

## 2021-11-10 ENCOUNTER — Ambulatory Visit (INDEPENDENT_AMBULATORY_CARE_PROVIDER_SITE_OTHER): Payer: Medicare Other | Admitting: Family Medicine

## 2021-11-10 VITALS — BP 138/88 | HR 100 | Temp 98.0°F | Resp 16 | Wt 152.0 lb

## 2021-11-10 DIAGNOSIS — L723 Sebaceous cyst: Secondary | ICD-10-CM

## 2021-11-10 DIAGNOSIS — Z23 Encounter for immunization: Secondary | ICD-10-CM | POA: Diagnosis not present

## 2021-11-13 ENCOUNTER — Ambulatory Visit: Payer: Medicare Other | Admitting: Family Medicine

## 2021-11-13 ENCOUNTER — Encounter: Payer: Self-pay | Admitting: Family Medicine

## 2021-11-13 VITALS — BP 130/74 | HR 86 | Temp 98.2°F | Resp 16 | Wt 153.2 lb

## 2021-11-13 DIAGNOSIS — L508 Other urticaria: Secondary | ICD-10-CM

## 2021-11-13 DIAGNOSIS — B999 Unspecified infectious disease: Secondary | ICD-10-CM | POA: Diagnosis not present

## 2021-11-13 DIAGNOSIS — J302 Other seasonal allergic rhinitis: Secondary | ICD-10-CM

## 2021-11-13 MED ORDER — EPIPEN 2-PAK 0.3 MG/0.3ML IJ SOAJ: 0.3000 mg | Freq: Once | INTRAMUSCULAR | 2 refills | Status: AC

## 2021-11-13 NOTE — Patient Instructions (Signed)
Allergic rhinitis Continue allergen avoidance measures directed toward tree pollen as listed below Continue an over-the-counter antihistamine once a day as needed for runny nose or itch Continue Flonase 2 sprays in each nostril once a day as needed for stuffy nose Consider saline nasal rinses as needed for nasal symptoms. Use this before any medicated nasal sprays for best result  Hives (urticaria) Use the fewest amount of medications while remaining hive free You may substitute any of the following antihistamines. Remember to rotate to a different antihistamine about every 3 months. Some examples of over the counter antihistamines include Zyrtec (cetirizine), Xyzal (levocetirizine), Allegra (fexofenadine), and Claritin (loratidine).  Cetirizine (Zyrtec) 10mg  twice a day and famotidine (Pepcid) 20 mg twice a day. If no symptoms for 7-14 days then decrease to. Cetirizine (Zyrtec) 10mg  twice a day and famotidine (Pepcid) 20 mg once a day.  If no symptoms for 7-14 days then decrease to. Cetirizine (Zyrtec) 10mg  twice a day.  If no symptoms for 7-14 days then decrease to. Cetirizine (Zyrtec) 10mg  once a day.  May use Benadryl (diphenhydramine) as needed for breakthrough hives       If symptoms return, then step up dosage  Keep a detailed symptom journal including foods eaten, contact with allergens, medications taken, weather changes.    Recurrent infection Continue to keep track of infections and antibiotic use  Call the clinic if this treatment plan is not working well for you.  Follow up in 2 months or sooner if needed.  Reducing Pollen Exposure The American Academy of Allergy, Asthma and Immunology suggests the following steps to reduce your exposure to pollen during allergy seasons. Do not hang sheets or clothing out to dry; pollen may collect on these items. Do not mow lawns or spend time around freshly cut grass; mowing stirs up pollen. Keep windows closed at night.  Keep car windows  closed while driving. Minimize morning activities outdoors, a time when pollen counts are usually at their highest. Stay indoors as much as possible when pollen counts or humidity is high and on windy days when pollen tends to remain in the air longer. Use air conditioning when possible.  Many air conditioners have filters that trap the pollen spores. Use a HEPA room air filter to remove pollen form the indoor air you breathe.

## 2021-11-13 NOTE — Progress Notes (Addendum)
[place] [address] Dept: [phone]  FOLLOW UP NOTE  Patient ID: Julia Garner, female    DOB: June 29, 1950  Age: 71 y.o. MRN: [id number] Date of Office Visit: 11/13/2021  Assessment  Chief Complaint: Medication Refill (Xolair) and Follow-up (Everything is going fine per patient.)  HPI Julia Garner is a 71 year old female who presents to the clinic for a follow up visit. She was last seen in this clinic on 09/15/2020 by Dr. Ernst Bowler for evaluation of chronic urticaria and recurrent infection.  Her current problem list is significant for chronic ITP and type 2 diabetes. In the interim she was admitted to the hospital between 07/24/2021 and 07/26/2021 for treatment of COVID-19, hypoxemia, and hypokalemia. Prior to that incident she had made a pilgrimage back to [place]. At today's visit, she reports that her chronic urticaria has been moderately well controlled with only infrequent breakouts. She continues an over the counter antihistamine daily and self administers Xolair 300 mg once every 28 days.  She denies large or local reactions with Xolair injections and reports a significant decrease in her symptoms of chronic urticaria while continuing on Xolair. Allergic rhinitis is reported as moderately well controlled with occasional rhinorrhea for which she is not using a nasal saline or nasal steroid spray. Her last environmental allergy testing via lab was on 07/03/2018 and was positive to tree pollen and indicated total IgE 18.  Work-up for chronic urticaria was negative.  Results for immune screening include normal immunoglobulin levels, tetanus/diphtheria antibodies were protective, complement proteins were normal, and pneumococcal antibodies were protective on 07/03/2018.  Her current medications are listed in the chart.    Drug Allergies:  Allergies  Allergen Reactions   Niacin And Related     As component of simcor (flushing)   Penicillins Itching    Hot  flashes. Other reaction(s): Not available   Statins     Other reaction(s): Other (See Comments) other   Tylenol  [Acetaminophen]     Physical Exam: BP 130/74   Pulse 86   Temp 98.2 F (36.8 C) (Temporal)   Resp 16   Wt 153 lb 3.2 oz (69.5 kg)   SpO2 96%   BMI 28.95 kg/m    Physical Exam Vitals reviewed.  Constitutional:      Appearance: Normal appearance.  HENT:     Head: Normocephalic and atraumatic.     Right Ear: Tympanic membrane normal.     Left Ear: Tympanic membrane normal.     Nose:     Comments: Bilateral nares slightly erythematous with clear nasal drainage noted.  Pharynx normal.  Ears normal.  Eyes normal.    Mouth/Throat:     Pharynx: Oropharynx is clear.  Eyes:     Conjunctiva/sclera: Conjunctivae normal.  Cardiovascular:     Rate and Rhythm: Normal rate and regular rhythm.     Heart sounds: Normal heart sounds. No murmur heard. Pulmonary:     Effort: Pulmonary effort is normal.     Breath sounds: Normal breath sounds.     Comments: Lungs clear to auscultation Musculoskeletal:        General: Normal range of motion.     Cervical back: Normal range of motion and neck supple.  Skin:    General: Skin is warm and dry.  Neurological:     Mental Status: She is alert and oriented to person, place, and time.  Psychiatric:        Mood and Affect: Mood normal.  Behavior: Behavior normal.        Thought Content: Thought content normal.        Judgment: Judgment normal.     Assessment and Plan: 1. Chronic urticaria   2. Recurrent infections   3. Other seasonal allergic rhinitis     Meds ordered this encounter  Medications   EPIPEN 2-PAK 0.3 MG/0.3ML SOAJ injection    Sig: Inject 0.3 mg into the muscle once for 1 dose.    Dispense:  1 each    Refill:  2    Patient Instructions  Allergic rhinitis Continue allergen avoidance measures directed toward tree pollen as listed below Continue an over-the-counter antihistamine once a day as needed  for runny nose or itch Continue Flonase 2 sprays in each nostril once a day as needed for stuffy nose Consider saline nasal rinses as needed for nasal symptoms. Use this before any medicated nasal sprays for best result  Hives (urticaria) Use the fewest amount of medications while remaining hive free You may substitute any of the following antihistamines. Remember to rotate to a different antihistamine about every 3 months. Some examples of over the counter antihistamines include Zyrtec (cetirizine), Xyzal (levocetirizine), Allegra (fexofenadine), and Claritin (loratidine).  Cetirizine (Zyrtec) 10mg  twice a day and famotidine (Pepcid) 20 mg twice a day. If no symptoms for 7-14 days then decrease to. Cetirizine (Zyrtec) 10mg  twice a day and famotidine (Pepcid) 20 mg once a day.  If no symptoms for 7-14 days then decrease to. Cetirizine (Zyrtec) 10mg  twice a day.  If no symptoms for 7-14 days then decrease to. Cetirizine (Zyrtec) 10mg  once a day.  May use Benadryl (diphenhydramine) as needed for breakthrough hives       If symptoms return, then step up dosage  Keep a detailed symptom journal including foods eaten, contact with allergens, medications taken, weather changes.    Recurrent infection Continue to keep track of infections and antibiotic use  Call the clinic if this treatment plan is not working well for you.  Follow up in 2 months or sooner if needed.   Return in about 2 months (around 01/13/2022), or if symptoms worsen or fail to improve.    Thank you for the opportunity to care for this patient.  Please do not hesitate to contact me with questions.  , FNP [facility]

## 2021-11-20 ENCOUNTER — Telehealth: Payer: Self-pay | Admitting: *Deleted

## 2021-11-20 ENCOUNTER — Ambulatory Visit: Payer: Medicare Other | Admitting: Family Medicine

## 2021-11-20 NOTE — Telephone Encounter (Signed)
-----   Message from [place] sent at 11/17/2021 11:13 AM EDT ----- Need pap sent to [facility]

## 2021-11-20 NOTE — Telephone Encounter (Signed)
Patient called and requested provider form to be sent to [facility] to continue Xolair./ I advised her I would do same and have faxed form and advised patient to followup with [facility] in 48 hours to reorder her Xolair

## 2021-11-23 DIAGNOSIS — H5213 Myopia, bilateral: Secondary | ICD-10-CM | POA: Diagnosis not present

## 2021-11-23 DIAGNOSIS — H1045 Other chronic allergic conjunctivitis: Secondary | ICD-10-CM | POA: Diagnosis not present

## 2021-11-23 DIAGNOSIS — H43813 Vitreous degeneration, bilateral: Secondary | ICD-10-CM | POA: Diagnosis not present

## 2021-11-23 DIAGNOSIS — E119 Type 2 diabetes mellitus without complications: Secondary | ICD-10-CM | POA: Diagnosis not present

## 2021-11-23 LAB — HM DIABETES EYE EXAM

## 2021-11-27 ENCOUNTER — Encounter: Payer: Self-pay | Admitting: Family Medicine

## 2021-11-28 DIAGNOSIS — Z780 Asymptomatic menopausal state: Secondary | ICD-10-CM | POA: Diagnosis not present

## 2021-11-28 LAB — HM DEXA SCAN: HM Dexa Scan: NORMAL

## 2021-12-12 ENCOUNTER — Encounter: Payer: Self-pay | Admitting: Allergy & Immunology

## 2021-12-12 ENCOUNTER — Other Ambulatory Visit: Payer: Self-pay

## 2021-12-12 ENCOUNTER — Ambulatory Visit (INDEPENDENT_AMBULATORY_CARE_PROVIDER_SITE_OTHER): Payer: Medicare Other | Admitting: Allergy & Immunology

## 2021-12-12 VITALS — BP 138/76 | HR 74 | Temp 98.1°F | Resp 16

## 2021-12-12 DIAGNOSIS — Z01818 Encounter for other preprocedural examination: Secondary | ICD-10-CM | POA: Diagnosis not present

## 2021-12-12 DIAGNOSIS — R21 Rash and other nonspecific skin eruption: Secondary | ICD-10-CM

## 2021-12-12 DIAGNOSIS — L508 Other urticaria: Secondary | ICD-10-CM | POA: Diagnosis not present

## 2021-12-12 DIAGNOSIS — B999 Unspecified infectious disease: Secondary | ICD-10-CM

## 2021-12-12 DIAGNOSIS — J302 Other seasonal allergic rhinitis: Secondary | ICD-10-CM

## 2021-12-12 DIAGNOSIS — J4520 Mild intermittent asthma, uncomplicated: Secondary | ICD-10-CM

## 2021-12-12 MED ORDER — CLOBETASOL PROPIONATE 0.05 % EX OINT
1.0000 | TOPICAL_OINTMENT | Freq: Two times a day (BID) | CUTANEOUS | 2 refills | Status: DC
Start: 2021-12-12 — End: 2023-08-16

## 2021-12-12 MED ORDER — PREDNISONE 10 MG PO TABS
ORAL_TABLET | ORAL | 0 refills | Status: DC
Start: 2021-12-12 — End: 2022-01-10

## 2021-12-12 NOTE — Progress Notes (Signed)
FOLLOW UP  Date of Service/Encounter:  12/12/21   Assessment:   Chronic urticaria - controlled with Xolair monthly, but with some breakthrough episodes  New onset rash - ? contact dermatitis   Recurrent infections - mostly sinopulmonary in nature (repeating Pneumococcal titers today)   Chronic ITP - with round of rituximab three years ago (weekly x 4 doses)   Intermittent asthma, uncomplicated - controlled with PRN albuterol   Plan/Recommendations:   1. Rash - This does not look like a crazy scary rash. - We are going to start you on a prednisone taper. - We are also going to start you on clobetasol twice daily. - This is a strong steroid. - Call us at the end of the week with an update.   2. Return in about 6 months (around 06/12/2022).    Subjective:   Julia Garner is a 71 y.o. female presenting today for follow up of  Chief Complaint  Patient presents with   Rash    REDNESS IN THE NECK    Julia Garner has a history of the following: Patient Active Problem List   Diagnosis Date Noted   Leukocytosis 07/25/2021   COVID 07/25/2021   COVID-19 07/24/2021   Uncontrolled type 2 diabetes mellitus with hyperglycemia, without long-term current use of insulin (HCC) 07/24/2021   Hypokalemia 07/24/2021   Carpal tunnel syndrome 07/20/2021   Connective tissue disease (HCC) 07/20/2021   Positive colorectal cancer screening using Cologuard test    Polyp of colon    SVT (supraventricular tachycardia) 06/08/2020   Bilateral carpal tunnel syndrome 07/31/2019   Aortic atherosclerosis (HCC) 11/04/2018   Fatty liver 11/04/2018   Chronic urticaria 08/15/2018   Chronic ITP (idiopathic thrombocytopenia) (HCC) 08/15/2018   Recurrent infections 08/15/2018   Diabetes mellitus without complication (HCC) 09/15/2017   Acute ITP (HCC) 09/16/2016   Other seasonal allergic rhinitis 06/29/2014   Insomnia 06/29/2014   Arthralgia of temporomandibular joint 06/29/2014   Edema  06/29/2014   Oral aphthae 07/08/2007   Cutaneous lipodystrophy 06/11/2007   Arthropathy of hand 01/25/2007   Fam hx-ischem heart disease 01/21/2007   Menopausal and postmenopausal disorder 10/08/2005   Tachycardia 10/08/2005   Thrombocytopenia (HCC) 10/08/2005   Obstructive sleep apnea of adult 10/24/2004   Essential (primary) hypertension 01/16/2004   Mood disorder (HCC) 01/15/2002    History obtained from: chart review and patient.  Julia Garner is a 71 y.o. female presenting for a sick visit.  She was last seen in October 2023 by Thurston Hole one of our nurse practitioners.  At that time, she was continued on Flonase for her allergic rhinitis.  She was also continued on suppressive insulin doses for her urticaria.  Her recurrent infections seem to be under good control.  In the interim, she started having issues with a rash that started 2-3 days before Thanksgiving. It become bigger and darker and more painful. She did apply some calamine lotion on it. She has not tried triamcinolone on this. No one else has this in the family. She denies any fevers. She has never had any similar lesions in the past. She doe  She normally wears a gold chain. She has no history of metal allergies. She removed the gold chain around one week ago and it has not really improved since that time.   Her son is getting married in 10 days and she needs this cleared up. Her future daughter in law lives in [place] and this is where it is happening. Her older son lives  in [place].  She has a daughter who lives in [place].  They are going to be leaving to go to [place] in 1 week on December 5.  They are driving down there so they can have their car with them.  She did have COVID in July and her platelets held. She has been having some lingering intense fatigue since that time.  She was in the hospital for a couple of days.  Thankfully, she was not intubated.  She was up-to-date on all of her boosters and she has received the  latest booster as well.  Otherwise, there have been no changes to her past medical history, surgical history, family history, or social history.    Review of Systems  Constitutional:  Negative for chills, fever, malaise/fatigue and weight loss.  HENT: Negative.  Negative for congestion, ear discharge, ear pain and sinus pain.   Eyes:  Negative for pain, discharge and redness.  Respiratory:  Negative for cough, sputum production, shortness of breath and wheezing.   Cardiovascular: Negative.  Negative for chest pain and palpitations.  Gastrointestinal:  Negative for abdominal pain, constipation, diarrhea, heartburn, nausea and vomiting.  Skin: Negative.  Negative for itching and rash.  Neurological:  Negative for dizziness and headaches.  Endo/Heme/Allergies:  Negative for environmental allergies. Does not bruise/bleed easily.       Objective:   Blood pressure 138/76, pulse 74, temperature 98.1 F (36.7 C), temperature source Temporal, resp. rate 16, SpO2 96 %. There is no height or weight on file to calculate BMI.    Physical Exam Constitutional:      Appearance: She is well-developed.     Comments: Very lovely person. Talkative.   HENT:     Head: Normocephalic and atraumatic.     Right Ear: Tympanic membrane, ear canal and external ear normal.     Left Ear: Tympanic membrane, ear canal and external ear normal.     Nose: No nasal deformity, septal deviation, mucosal edema or rhinorrhea.     Right Turbinates: Enlarged, swollen and pale.     Left Turbinates: Enlarged, swollen and pale.     Right Sinus: No maxillary sinus tenderness or frontal sinus tenderness.     Left Sinus: No maxillary sinus tenderness or frontal sinus tenderness.     Mouth/Throat:     Mouth: Mucous membranes are not pale and not dry.     Pharynx: Uvula midline.  Eyes:     General: Lids are normal. No allergic shiner.       Right eye: No discharge.        Left eye: No discharge.     Conjunctiva/sclera:  Conjunctivae normal.     Right eye: Right conjunctiva is not injected. No chemosis.    Left eye: Left conjunctiva is not injected. No chemosis.    Pupils: Pupils are equal, round, and reactive to light.  Cardiovascular:     Rate and Rhythm: Normal rate and regular rhythm.     Heart sounds: Normal heart sounds.  Pulmonary:     Effort: Pulmonary effort is normal. No tachypnea, accessory muscle usage or respiratory distress.     Breath sounds: Normal breath sounds. No wheezing, rhonchi or rales.     Comments: Moving air well in all lung fields. No increased work of breathing noted.  Chest:     Chest wall: No tenderness.  Lymphadenopathy:     Cervical: No cervical adenopathy.  Skin:    General: Skin is warm.  Capillary Refill: Capillary refill takes less than 2 seconds.     Coloration: Skin is not pale.     Findings: No abrasion, erythema, petechiae or rash. Rash is not papular, urticarial or vesicular.     Comments: There is an oval appearing lesion on the left side of her neck.  There is what appears to be scaling, but this might be related to drying calamine lotion.  Neurological:     Mental Status: She is alert.  Psychiatric:        Behavior: Behavior is cooperative.          Diagnostic studies: none       Malachi Bonds, MD  [facility]

## 2021-12-12 NOTE — Patient Instructions (Addendum)
1. Rash - This does not look like a crazy scary rash. - We are going to start you on a prednisone taper. - We are also going to start you on clobetasol twice daily. - This is a strong steroid. - Call us at the end of the week with an update.   2. Return in about 6 months (around 06/12/2022).    Please inform us of any Emergency Department visits, hospitalizations, or changes in symptoms. Call us before going to the ED for breathing or allergy symptoms since we might be able to fit you in for a sick visit. Feel free to contact us anytime with any questions, problems, or concerns.  It was a pleasure to see you again today!  Websites that have reliable patient information: 1. American Academy of Asthma, Allergy, and Immunology: www.aaaai.org 2. Food Allergy Research and Education (FARE): foodallergy.org 3. Mothers of Asthmatics: http://www.asthmacommunitynetwork.org 4. American College of Allergy, Asthma, and Immunology: www.acaai.org   COVID-19 Vaccine Information can be found at: [email or web address].[email or web address] For questions related to vaccine distribution or appointments, please email [email or web address] or call [phone].     "Like" [place] on Facebook and Instagram for our latest updates!        Make sure you are registered to vote! If you have moved or changed any of your contact information, you will need to get this updated before voting!  In some cases, you MAY be able to register to vote online: [email or web address]

## 2021-12-15 MED ORDER — PREDNISONE 10 MG PO TABS: ORAL_TABLET | ORAL | 0 refills | Status: DC

## 2021-12-26 ENCOUNTER — Other Ambulatory Visit: Payer: Self-pay | Admitting: Family Medicine

## 2021-12-27 ENCOUNTER — Other Ambulatory Visit: Payer: Medicare Other

## 2021-12-27 ENCOUNTER — Ambulatory Visit: Payer: Medicare Other | Admitting: Oncology

## 2021-12-27 NOTE — Telephone Encounter (Signed)
Rx was sent to [facility] 03/20/21 #90/3.   Requested Prescriptions  Pending Prescriptions Disp Refills   irbesartan (AVAPRO) 75 MG tablet [Pharmacy Med Name: IRBESARTAN  75MG   TAB] 100 tablet 2    Sig: TAKE 1 TABLET BY MOUTH DAILY     Cardiovascular:  Angiotensin Receptor Blockers Passed - 12/26/2021 10:45 PM      Passed - Cr in normal range and within 180 days    Creatinine, Ser  Date Value Ref Range Status  08/08/2021 0.64 0.57 - 1.00 mg/dL Final         Passed - K in normal range and within 180 days    Potassium  Date Value Ref Range Status  08/08/2021 3.9 3.5 - 5.2 mmol/L Final         Passed - Patient is not pregnant      Passed - Last BP in normal range    BP Readings from Last 1 Encounters:  12/12/21 138/76         Passed - Valid encounter within last 6 months    Recent Outpatient Visits           1 month ago Sebaceous cyst   [facility], MD   4 months ago Diabetes mellitus without complication [facility])   [facility] M, DO   9 months ago Chronic ITP (idiopathic thrombocytopenia) [facility])   [facility], MD   1 year ago Need for influenza vaccination   [facility], MD   1 year ago SVT (supraventricular tachycardia) [facility])   [facility], MD       Future Appointments             In 6 months Malva Limes Dellis Anes, MD [facility]

## 2022-01-10 ENCOUNTER — Encounter: Payer: Self-pay | Admitting: Family Medicine

## 2022-01-10 ENCOUNTER — Ambulatory Visit (INDEPENDENT_AMBULATORY_CARE_PROVIDER_SITE_OTHER): Payer: Medicare Other | Admitting: Family Medicine

## 2022-01-10 VITALS — BP 119/63 | HR 80 | Resp 14 | Wt 154.0 lb

## 2022-01-10 DIAGNOSIS — I10 Essential (primary) hypertension: Secondary | ICD-10-CM | POA: Diagnosis not present

## 2022-01-10 DIAGNOSIS — E119 Type 2 diabetes mellitus without complications: Secondary | ICD-10-CM | POA: Diagnosis not present

## 2022-01-10 LAB — POCT GLYCOSYLATED HEMOGLOBIN (HGB A1C): Hemoglobin A1C: 6.4 % — AB (ref 4.0–5.6)

## 2022-01-10 NOTE — Patient Instructions (Signed)
.   Please review the attached list of medications and notify my office if there are any errors.   . Please bring all of your medications to every appointment so we can make sure that our medication list is the same as yours.   

## 2022-01-10 NOTE — Progress Notes (Signed)
I,[name],acting as a scribe for Lelon Huh, MD.,have documented all relevant documentation on the behalf of Lelon Huh, MD,as directed by  Lelon Huh, MD while in the presence of Lelon Huh, MD.   Established patient visit   Patient: Julia Garner   DOB: 10/31/1950   71 y.o. Female  MRN: [id number] Visit Date: 01/10/2022  Today's healthcare provider: Lelon Huh, MD   No chief complaint on file.  Subjective    HPI  Diabetes Mellitus Type II, Follow-up  Lab Results  Component Value Date   HGBA1C 6.3 (H) 08/08/2021   HGBA1C 6.2 (H) 03/14/2021   HGBA1C 6.1 (A) 11/09/2020   Wt Readings from Last 3 Encounters:  11/13/21 153 lb 3.2 oz (69.5 kg)  11/10/21 152 lb (68.9 kg)  11/08/21 151 lb 3.2 oz (68.6 kg)   Last seen for diabetes 5 months ago.  Management since then includes; continue medications.  Home blood sugar records: fasting range: 120--169 Most Recent Eye Exam: 11/23/2021  Pertinent Labs: Lab Results  Component Value Date   CHOL 127 03/14/2021   HDL 29 (L) 03/14/2021   LDLCALC 56 03/14/2021   TRIG 267 (H) 03/14/2021   CHOLHDL 4.4 03/14/2021   Lab Results  Component Value Date   NA 140 08/08/2021   K 3.9 08/08/2021   CREATININE 0.64 08/08/2021   EGFR 96 08/08/2021     ---------------------------------------------------------------------------------------------------   Medications: Outpatient Medications Prior to Visit  Medication Sig   albuterol (VENTOLIN HFA) 108 (90 Base) MCG/ACT inhaler Inhale 2 puffs into the lungs every 6 (six) hours as needed for wheezing or shortness of breath.   ascorbic acid (VITAMIN C) 500 MG tablet Take 500 mg by mouth daily.   Blood Glucose Monitoring Suppl (ONE TOUCH ULTRA 2) w/Device KIT Use to check sugar daily for type 2 diabetes E11.9   cetirizine (ZYRTEC) 10 MG tablet Take 20 mg by mouth at bedtime as needed.    clobetasol ointment (TEMOVATE) 5.05 % Apply 1 Application topically 2 (two) times  daily. Try to use for only two weeks at a time.   famotidine (PEPCID) 40 MG tablet Take 40 mg by mouth daily as needed.    glucosamine-chondroitin 500-400 MG tablet Take 1 tablet by mouth 3 (three) times daily.   glucose blood (ONETOUCH ULTRA) test strip Use to check sugar twice daily for type 2 diabetes E11.9   irbesartan (AVAPRO) 75 MG tablet TAKE 1 TABLET BY MOUTH DAILY   Lancets (ONETOUCH ULTRASOFT) lancets Use to check sugar twice daily for type 2 diabetes E11.9   metFORMIN (GLUCOPHAGE-XR) 500 MG 24 hr tablet Take 1 tablet (500 mg total) by mouth daily with breakfast.   Multiple Vitamin (MULTIVITAMIN) tablet Take 1 tablet by mouth daily.   Naproxen Sodium 220 MG CAPS Take by mouth daily as needed.   omalizumab Arvid Right) 150 MG/ML prefilled syringe Inject 300 mg into the skin every 14 (fourteen) days.   Omega 3 1200 MG CAPS Take 2 capsules by mouth daily.   predniSONE (DELTASONE) 10 MG tablet Take two tablets (42m) twice daily for three days, then one tablet (140m twice daily for three days, then STOP.   predniSONE (DELTASONE) 10 MG tablet Take four tabs daily for three days, three tabs daily for three days, two tabs daily for three days, one tab daily for three days, and then STOP.   sertraline (ZOLOFT) 50 MG tablet TAKE 2 TABLETS BY MOUTH  DAILY   simvastatin (ZOCOR) 40 MG tablet Take  40 mg by mouth at bedtime.   traZODone (DESYREL) 50 MG tablet Take 1-2 tablets (50-100 mg total) by mouth at bedtime.   Ubiquinol 200 MG CAPS Take by mouth. CoQ10   Facility-Administered Medications Prior to Visit  Medication Dose Route Frequency Provider   omalizumab Arvid Right) injection 300 mg  300 mg Subcutaneous Q28 days Valentina Shaggy, MD    Review of Systems  Constitutional:  Negative for appetite change, chills, fatigue and fever.  Respiratory:  Negative for chest tightness and shortness of breath.   Cardiovascular:  Negative for chest pain and palpitations.  Gastrointestinal:  Negative for  abdominal pain, nausea and vomiting.  Neurological:  Negative for dizziness and weakness.     Objective    BP 119/63 (BP Location: Left Arm, Patient Position: Sitting, Cuff Size: Large)   Pulse 80   Resp 14   Wt 154 lb (69.9 kg)   SpO2 98%   BMI 29.10 kg/m    Physical Exam  General appearance: Well developed, well nourished female, cooperative and in no acute distress Head: Normocephalic, without obvious abnormality, atraumatic Respiratory: Respirations even and unlabored, normal respiratory rate Extremities: All extremities are intact.  Skin: Skin color, texture, turgor normal. No rashes seen  Psych: Appropriate mood and affect. Neurologic: Mental status: Alert, oriented to person, place, and time, thought content appropriate.   Results for orders placed or performed in visit on 01/10/22  POCT glycosylated hemoglobin (Hb A1C)  Result Value Ref Range   Hemoglobin A1C 6.4 (A) 4.0 - 5.6 %    Assessment & Plan     1. Diabetes mellitus without complication ([place]) Well controlled.  Continue current medications.    2. Essential (primary) hypertension Well controlled.  Continue current medications.     Future Appointments  Date Time Provider [place]  04/13/2022  1:40 PM Caryn Section Kirstie Peri, MD [facility]         The entirety of the information documented in the History of Present Illness, Review of Systems and Physical Exam were personally obtained by me. Portions of this information were initially documented by the CMA and reviewed by me for thoroughness and accuracy.     Lelon Huh, MD  [facility] [phone] (phone) [phone] (fax)  [place]

## 2022-01-12 ENCOUNTER — Other Ambulatory Visit: Payer: Medicare Other

## 2022-01-12 ENCOUNTER — Ambulatory Visit: Payer: Medicare Other | Admitting: Oncology

## 2022-01-29 ENCOUNTER — Encounter: Payer: Self-pay | Admitting: Oncology

## 2022-01-29 ENCOUNTER — Inpatient Hospital Stay: Payer: Medicare Other

## 2022-01-29 ENCOUNTER — Inpatient Hospital Stay: Payer: Medicare Other | Attending: Oncology | Admitting: Oncology

## 2022-01-29 VITALS — BP 124/71 | HR 76 | Temp 97.5°F | Wt 153.5 lb

## 2022-01-29 DIAGNOSIS — D693 Immune thrombocytopenic purpura: Secondary | ICD-10-CM | POA: Diagnosis not present

## 2022-01-29 DIAGNOSIS — I10 Essential (primary) hypertension: Secondary | ICD-10-CM | POA: Insufficient documentation

## 2022-01-29 DIAGNOSIS — D696 Thrombocytopenia, unspecified: Secondary | ICD-10-CM

## 2022-01-29 LAB — COMPREHENSIVE METABOLIC PANEL
ALT: 25 U/L (ref 0–44)
AST: 21 U/L (ref 15–41)
Albumin: 3.6 g/dL (ref 3.5–5.0)
Alkaline Phosphatase: 52 U/L (ref 38–126)
Anion gap: 9 (ref 5–15)
BUN: 11 mg/dL (ref 8–23)
CO2: 26 mmol/L (ref 22–32)
Calcium: 8.5 mg/dL — ABNORMAL LOW (ref 8.9–10.3)
Chloride: 101 mmol/L (ref 98–111)
Creatinine, Ser: 0.73 mg/dL (ref 0.44–1.00)
GFR, Estimated: >60 mL/min (ref >60)
Glucose, Bld: 100 mg/dL — ABNORMAL HIGH (ref 70–99)
Potassium: 3.5 mmol/L (ref 3.5–5.1)
Sodium: 136 mmol/L (ref 135–145)
Total Bilirubin: 0.3 mg/dL (ref 0.3–1.2)
Total Protein: 6.7 g/dL (ref 6.5–8.1)

## 2022-01-29 LAB — CBC WITH DIFFERENTIAL/PLATELET
Abs Immature Granulocytes: 0.04 10*3/uL (ref 0.00–0.07)
Basophils Absolute: 0 10*3/uL (ref 0.0–0.1)
Basophils Relative: 0 %
Eosinophils Absolute: 0.4 10*3/uL (ref 0.0–0.5)
Eosinophils Relative: 4 %
HCT: 40.2 % (ref 36.0–46.0)
Hemoglobin: 13.4 g/dL (ref 12.0–15.0)
Immature Granulocytes: 0 %
Lymphocytes Relative: 42 %
Lymphs Abs: 4.3 10*3/uL — ABNORMAL HIGH (ref 0.7–4.0)
MCH: 29.1 pg (ref 26.0–34.0)
MCHC: 33.3 g/dL (ref 30.0–36.0)
MCV: 87.4 fL (ref 80.0–100.0)
Monocytes Absolute: 0.7 10*3/uL (ref 0.1–1.0)
Monocytes Relative: 7 %
Neutro Abs: 4.7 10*3/uL (ref 1.7–7.7)
Neutrophils Relative %: 47 %
Platelets: 174 10*3/uL (ref 150–400)
RBC: 4.6 MIL/uL (ref 3.87–5.11)
RDW: 13.5 % (ref 11.5–15.5)
WBC: 10.2 10*3/uL (ref 4.0–10.5)
nRBC: 0 % (ref 0.0–0.2)

## 2022-01-29 NOTE — Assessment & Plan Note (Signed)
Previous has rituximab treatments Labs are reviewed and discussed with patient. Platelet count is normal today.  Follow up in 6 months

## 2022-01-29 NOTE — Assessment & Plan Note (Signed)
Recommend patient to start calcium supplementation 1200 mg daily.  She may also take vitamin D 1000 units daily.

## 2022-01-29 NOTE — Progress Notes (Signed)
Hematology/Oncology Progress note [phone] [phone]    Patient Care Team: Birdie Sons, MD as PCP - General (Family Medicine) Anell Barr, OD (Optometry)  REASON FOR VISIT Follow up for treatment of thrombocytopenia  ASSESSMENT & PLAN:  Chronic ITP (idiopathic thrombocytopenia) (HCC) Previous has rituximab treatments Labs are reviewed and discussed with patient. Platelet count is normal today.  Follow up in 6 months  Hypocalcemia Recommend patient to start calcium supplementation 1200 mg daily.  She may also take vitamin D 1000 units daily.  Orders Placed This Encounter  Procedures   CBC with Differential    Standing Status:   Standing    Number of Occurrences:   1    Standing Expiration Date:   01/30/2023   Comprehensive metabolic panel    Standing Status:   Standing    Number of Occurrences:   1    Standing Expiration Date:   01/30/2023    All questions were answered. The patient knows to call the clinic with any problems, questions or concerns.  Earlie Server, MD, PhD [facility] Hematology Oncology 01/29/2022     PERTINENT HEMATOLOGY HISTORY Julia Garner 72 y.o.  female who has a history of recurrent ITP. Before she was referred to me, she has a long history of ITP which were treated with sterids by primary care physician and she used to respond to steroids. She was seen by anther Hematologist Dr.Pandit many years ago. Dr.Pandit has left [place].  She had labs doneon 08/22/2016. Platelet counts were slow at 15,000. She was treated with a short course (5 days) of Steroids by Dr.Fisher and platelet recovered to 120,000, however later dropped again to 11,000 on 09/10/2016.  I treated her with a longer course of prednisone with slow tapering and her counts again responded to treatment. During the tapering the course,she visited home country [place] and had blood work done there after she finished the tapering course of Prednisone there.  She reports the platelet counts were normal in [place].   Patient has had lab work up including negative hepatitis, HIV, normal LDH, normal B12 and folate level. ANA was positive and she was evaluated by rheumatologist. Based on my phone discussion with Rheumatologist, patient has had rheumatology work up and was not considered to have any autoimmune problems. She does not have hepatosplenomegaly.   Patient returned to clinic in December and repeat blood work on 12/26/2016 showed platelet count of 16,000. Treated with a course of Dexamethasone 40mg  x 4 days, platelet again responded and dropped again.  And she reports easy bruising, feeling tired.  Peripheral blood flowcytometry 1% of analyzed cells containing clonal B cell population which are CD5- and CD 10-.  There is mildly increased eosinophils 7%.   Patient does not have any palpable lymphadenopathy on physical examination and we obtained CT which showed no pathological lymphadenopathy. Bone marrow biopsy showed hypercellular marrow with trilineage hematopoiesis including increased megakaryocytes. No evidence of lymphoproliferative process involvement. Normal cytogenetics.  At this point patient has had extensive work up for her thrombocytopenia, and we discussed in lengthy that she most likely have ITP, there maybe a low grade lymphoma too. Given that she has recurrent ITP and platelet counts cannot be maintained without steroids, I suggest Rituximab weekly x 4 for consolidation. Patient has requested me to talk to her son in law Dr.Kamron who is a nephrologist in [place]. I have talked to Dr.Kamron about the rationale of using Rituximab and also the side effects profile. Dr.Kamron  agrees the plan and has relayed information to patient. I have also discussed the side effects of Rituximab with patient. She agrees with treatment.   # severe bilateral carpal tunnel syndrome and received joint steroid injections.  INTERVAL HISTORY Julia Garner is a  72 y.o. female who has above hematology history reviewed by me today presents for follow up visit for ITP.  Patient reports feeling.  Occasionally she sees bruising on her thighs Recent COVID-19 infection/bronchitis.  She was treated with 2 weeks of steroids.  She still has mild cough   Review of Systems  Constitutional:  Negative for appetite change, chills, fatigue and fever.  HENT:   Negative for hearing loss and voice change.   Eyes:  Negative for eye problems.  Respiratory:  Positive for cough. Negative for chest tightness.   Cardiovascular:  Negative for chest pain.  Gastrointestinal:  Negative for abdominal distention, abdominal pain and blood in stool.  Endocrine: Negative for hot flashes.  Genitourinary:  Negative for difficulty urinating and frequency.   Musculoskeletal:  Negative for arthralgias.  Skin:  Negative for itching and rash.  Neurological:  Negative for extremity weakness.  Hematological:  Negative for adenopathy.  Psychiatric/Behavioral:  Negative for confusion.     MEDICAL HISTORY:  Past Medical History:  Diagnosis Date   Allergy    Chronic ITP (idiopathic thrombocytopenia) (HCC)    Chronic urticaria 08/15/2018   Depression    Head injuries    History of seizure 2007   one seizure    Hypertension     SURGICAL HISTORY: Past Surgical History:  Procedure Laterality Date   CATARACT EXTRACTION, BILATERAL  around 2021   Dr. Clydene Pugh   CESAREAN SECTION  1990   CHOLECYSTECTOMY  2005   COLONOSCOPY WITH PROPOFOL N/A 12/20/2020   Procedure: COLONOSCOPY WITH PROPOFOL;  Surgeon: Midge Minium, MD;  Location: [facility] ENDOSCOPY;  Service: Endoscopy;  Laterality: N/A;   CYST EXCISION  1996   Tracheal Cyst    SOCIAL HISTORY: Social History   Socioeconomic History   Marital status: Married    Spouse name: Not on file   Number of children: 3   Years of education: Not on file   Highest education level: Not on file  Occupational History   Occupation: retired     Comment: Physical Therapist  Tobacco Use   Smoking status: Never   Smokeless tobacco: Never  Vaping Use   Vaping Use: Never used  Substance and Sexual Activity   Alcohol use: No    Alcohol/week: 0.0 standard drinks of alcohol   Drug use: No   Sexual activity: Not on file  Other Topics Concern   Not on file  Social History Narrative   Not on file   Social Determinants of Health   Financial Resource Strain: Low Risk  (11/08/2021)   Overall Financial Resource Strain (CARDIA)    Difficulty of Paying Living Expenses: Not hard at all  Food Insecurity: No Food Insecurity (11/08/2021)   Hunger Vital Sign    Worried About Running Out of Food in the Last Year: Never true    Ran Out of Food in the Last Year: Never true  Transportation Needs: No Transportation Needs (11/08/2021)   PRAPARE - Administrator, Civil Service (Medical): No    Lack of Transportation (Non-Medical): No  Physical Activity: Inactive (11/08/2021)   Exercise Vital Sign    Days of Exercise per Week: 0 days    Minutes of Exercise per Session: 0 min  Stress: Stress Concern Present (11/08/2021)   [place]    Feeling of Stress : To some extent  Social Connections: Moderately Integrated (11/08/2021)   Social Connection and Isolation Panel [NHANES]    Frequency of Communication with Friends and Family: More than three times a week    Frequency of Social Gatherings with Friends and Family: More than three times a week    Attends Religious Services: More than 4 times per year    Active Member of [employer] or Organizations: No    Attends Archivist Meetings: Never    Marital Status: Married  Human resources officer Violence: Not At Risk (11/08/2021)   Humiliation, Afraid, Rape, and Kick questionnaire    Fear of Current or Ex-Partner: No    Emotionally Abused: No    Physically Abused: No    Sexually Abused: No    FAMILY HISTORY: Family  History  Problem Relation Age of Onset   Heart attack Father        1st MI at age 59    ALLERGIES:  is allergic to niacin and related, penicillins, statins, and tylenol  [acetaminophen].  MEDICATIONS:  Current Outpatient Medications  Medication Sig Dispense Refill   ascorbic acid (VITAMIN C) 500 MG tablet Take 500 mg by mouth daily.     Blood Glucose Monitoring Suppl (ONE TOUCH ULTRA 2) w/Device KIT Use to check sugar daily for type 2 diabetes E11.9 1 kit 0   cetirizine (ZYRTEC) 10 MG tablet Take 20 mg by mouth at bedtime as needed.      clobetasol ointment (TEMOVATE) [id number] % Apply 1 Application topically 2 (two) times daily. Try to use for only two weeks at a time. 30 g 2   glucosamine-chondroitin 500-400 MG tablet Take 1 tablet by mouth 3 (three) times daily.     glucose blood (ONETOUCH ULTRA) test strip Use to check sugar twice daily for type 2 diabetes E11.9 100 each 4   irbesartan (AVAPRO) 75 MG tablet TAKE 1 TABLET BY MOUTH DAILY 90 tablet 3   Lancets (ONETOUCH ULTRASOFT) lancets Use to check sugar twice daily for type 2 diabetes E11.9 100 each 4   metFORMIN (GLUCOPHAGE-XR) 500 MG 24 hr tablet Take 1 tablet (500 mg total) by mouth daily with breakfast. 90 tablet 3   Multiple Vitamin (MULTIVITAMIN) tablet Take 1 tablet by mouth daily.     Naproxen Sodium 220 MG CAPS Take by mouth daily as needed.     omalizumab Arvid Right) 150 MG/ML prefilled syringe Inject 300 mg into the skin every 14 (fourteen) days. 12 mL 3   Omega 3 1200 MG CAPS Take 2 capsules by mouth daily.     sertraline (ZOLOFT) 50 MG tablet TAKE 2 TABLETS BY MOUTH  DAILY 200 tablet 2   simvastatin (ZOCOR) 40 MG tablet Take 40 mg by mouth at bedtime.     traZODone (DESYREL) 50 MG tablet Take 1-2 tablets (50-100 mg total) by mouth at bedtime. 180 tablet 4   Ubiquinol 200 MG CAPS Take by mouth. CoQ10     albuterol (VENTOLIN HFA) 108 (90 Base) MCG/ACT inhaler Inhale 2 puffs into the lungs every 6 (six) hours as needed for wheezing  or shortness of breath. (Patient not taking: Reported on 01/29/2022) 18 g 0   famotidine (PEPCID) 40 MG tablet Take 40 mg by mouth daily as needed.  (Patient not taking: Reported on 01/29/2022)     predniSONE (DELTASONE) 10 MG tablet Take four  tabs daily for three days, three tabs daily for three days, two tabs daily for three days, one tab daily for three days, and then STOP. (Patient not taking: Reported on 01/29/2022) 30 tablet 0   Current Facility-Administered Medications  Medication Dose Route Frequency Provider Last Rate Last Admin   omalizumab Arvid Right) injection 300 mg  300 mg Subcutaneous Q28 days Valentina Shaggy, MD   300 mg at 09/29/19 1523      .  PHYSICAL EXAMINATION: ECOG PERFORMANCE STATUS: 0 - Asymptomatic Vitals:   01/29/22 1441  BP: 124/71  Pulse: 76  Temp: (!) 97.5 F (36.4 C)  SpO2: 99%   Filed Weights   01/29/22 1441  Weight: 153 lb 8 oz (69.6 kg)  Physical Exam Constitutional:      General: She is not in acute distress.    Appearance: She is not diaphoretic.  HENT:     Head: Normocephalic and atraumatic.     Nose: Nose normal.     Mouth/Throat:     Pharynx: No oropharyngeal exudate.  Eyes:     General: No scleral icterus.       Left eye: No discharge.     Conjunctiva/sclera: Conjunctivae normal.     Pupils: Pupils are equal, round, and reactive to light.  Neck:     Vascular: No JVD.  Cardiovascular:     Rate and Rhythm: Normal rate and regular rhythm.     Heart sounds: Normal heart sounds. No murmur heard. Pulmonary:     Effort: Pulmonary effort is normal. No respiratory distress.     Breath sounds: Normal breath sounds. No wheezing.  Abdominal:     General: Bowel sounds are normal. There is no distension.     Palpations: Abdomen is soft.  Musculoskeletal:        General: No tenderness. Normal range of motion.     Cervical back: Normal range of motion and neck supple.  Lymphadenopathy:     Cervical: No cervical adenopathy.  Skin:     General: Skin is dry.     Findings: No rash.  Neurological:     Mental Status: She is alert and oriented to person, place, and time.     Cranial Nerves: No cranial nerve deficit.     Motor: No abnormal muscle tone.  Psychiatric:        Mood and Affect: Mood and affect normal.        Cognition and Memory: Memory normal.          LABORATORY DATA:  I have reviewed the data as listed Lab Results  Component Value Date   WBC 10.2 01/29/2022   HGB 13.4 01/29/2022   HCT 40.2 01/29/2022   MCV 87.4 01/29/2022   PLT 174 01/29/2022   Recent Labs    07/25/21 0554 07/26/21 0535 08/08/21 1409 01/29/22 1431  NA 147* 140 140 136  K 3.6 3.5 3.9 3.5  CL 116* 104 102 101  CO2 22 29 22 26   GLUCOSE 68* 102* 92 100*  BUN 8 9 10 11   CREATININE 0.45 0.85 0.64 0.73  CALCIUM 8.2* 8.6* 9.3 8.5*  GFRNONAA >60 >60  --  >60  PROT 5.4* 7.0 7.1 6.7  ALBUMIN 2.4* 3.3* 3.7* 3.6  AST 32 33 28 21  ALT 60* 69* 30 25  ALKPHOS 43 56 62 52  BILITOT 0.4 0.9 <0.2 0.3    RADIOGRAPHIC STUDIES: I have personally reviewed the radiological images as listed and agreed with the findings in  the report. No results found.

## 2022-02-01 ENCOUNTER — Telehealth: Payer: Self-pay | Admitting: Family Medicine

## 2022-02-01 DIAGNOSIS — I10 Essential (primary) hypertension: Secondary | ICD-10-CM

## 2022-02-01 DIAGNOSIS — E119 Type 2 diabetes mellitus without complications: Secondary | ICD-10-CM

## 2022-02-01 NOTE — Telephone Encounter (Signed)
[facility] faxed refill request for the following medications:  irbesartan (AVAPRO) 75 MG tablet   metFORMIN (GLUCOPHAGE-XR) 500 MG 24 hr tablet   Please advise.

## 2022-02-02 NOTE — Telephone Encounter (Signed)
Rx request denied Requesting refills too soon. Both have 1 year supplies LOV 01/10/22 NOV 04/13/22 LRF: 03/20/21 #90 3RF irbesartan 07/19/21 #90 3RF metFORMIN

## 2022-03-01 DIAGNOSIS — L723 Sebaceous cyst: Secondary | ICD-10-CM | POA: Diagnosis not present

## 2022-03-01 DIAGNOSIS — L2089 Other atopic dermatitis: Secondary | ICD-10-CM | POA: Diagnosis not present

## 2022-03-07 ENCOUNTER — Other Ambulatory Visit: Payer: Self-pay | Admitting: Family Medicine

## 2022-03-29 DIAGNOSIS — D693 Immune thrombocytopenic purpura: Secondary | ICD-10-CM | POA: Diagnosis not present

## 2022-03-29 DIAGNOSIS — R768 Other specified abnormal immunological findings in serum: Secondary | ICD-10-CM | POA: Diagnosis not present

## 2022-03-29 DIAGNOSIS — M8580 Other specified disorders of bone density and structure, unspecified site: Secondary | ICD-10-CM | POA: Diagnosis not present

## 2022-03-29 DIAGNOSIS — G5602 Carpal tunnel syndrome, left upper limb: Secondary | ICD-10-CM | POA: Diagnosis not present

## 2022-03-29 DIAGNOSIS — M359 Systemic involvement of connective tissue, unspecified: Secondary | ICD-10-CM | POA: Diagnosis not present

## 2022-04-13 ENCOUNTER — Ambulatory Visit (INDEPENDENT_AMBULATORY_CARE_PROVIDER_SITE_OTHER): Payer: Medicare Other | Admitting: Family Medicine

## 2022-04-13 VITALS — BP 128/88 | HR 88 | Resp 16 | Wt 154.2 lb

## 2022-04-13 DIAGNOSIS — E119 Type 2 diabetes mellitus without complications: Secondary | ICD-10-CM | POA: Diagnosis not present

## 2022-04-13 DIAGNOSIS — I10 Essential (primary) hypertension: Secondary | ICD-10-CM

## 2022-04-13 LAB — POCT GLYCOSYLATED HEMOGLOBIN (HGB A1C)
Est. average glucose Bld gHb Est-mCnc: 131
Hemoglobin A1C: 6.2 % — AB (ref 4.0–5.6)

## 2022-04-13 MED ORDER — SEMAGLUTIDE(0.25 OR 0.5MG/DOS) 2 MG/3ML ~~LOC~~ SOPN: PEN_INJECTOR | SUBCUTANEOUS | 2 refills | Status: DC

## 2022-04-13 NOTE — Progress Notes (Unsigned)
I,Sha'taria Zan Triska,acting as a Education administrator for Lelon Huh, MD.,have documented all relevant documentation on the behalf of Lelon Huh, MD,as directed by  Lelon Huh, MD while in the presence of Lelon Huh, MD.   Established patient visit   Patient: Julia Garner   DOB: [birth year]   72 y.o. Female  MRN: [id number] Visit Date: 04/13/2022  Today's healthcare provider: Lelon Huh, MD   No chief complaint on file.  Subjective    HPI  -Mammogram: declined -Dexa Scan: completed February 2024 -Covid Vaccine: declined -Tetanus Vaccine: if needed Diabetes Mellitus Type II, Follow-up  Lab Results  Component Value Date   HGBA1C 6.4 (A) 01/10/2022   HGBA1C 6.3 (H) 08/08/2021   HGBA1C 6.2 (H) 03/14/2021   Wt Readings from Last 3 Encounters:  01/29/22 153 lb 8 oz (69.6 kg)  01/10/22 154 lb (69.9 kg)  11/13/21 153 lb 3.2 oz (69.5 kg)   Last seen for diabetes 3 months ago.  Management since then includes continue current medications. Symptoms: No fatigue No foot ulcerations  No appetite changes No nausea  No paresthesia of the feet  Yes polydipsia  No polyuria No visual disturbances   No vomiting     Home blood sugar records: fasting range: 155-130  Pertinent Labs: Lab Results  Component Value Date   CHOL 127 03/14/2021   HDL 29 (L) 03/14/2021   LDLCALC 56 03/14/2021   TRIG 267 (H) 03/14/2021   CHOLHDL 4.4 03/14/2021   Lab Results  Component Value Date   NA 136 01/29/2022   K 3.5 01/29/2022   CREATININE 0.73 01/29/2022   GFRNONAA >60 01/29/2022     ---------------------------------------------------------------------------------------------------   Medications: Outpatient Medications Prior to Visit  Medication Sig   albuterol (VENTOLIN HFA) 108 (90 Base) MCG/ACT inhaler Inhale 2 puffs into the lungs every 6 (six) hours as needed for wheezing or shortness of breath. (Patient not taking: Reported on 01/29/2022)   ascorbic acid (VITAMIN C) 500 MG tablet  Take 500 mg by mouth daily.   Blood Glucose Monitoring Suppl (ONE TOUCH ULTRA 2) w/Device KIT Use to check sugar daily for type 2 diabetes E11.9   cetirizine (ZYRTEC) 10 MG tablet Take 20 mg by mouth at bedtime as needed.    clobetasol ointment (TEMOVATE) [id number] % Apply 1 Application topically 2 (two) times daily. Try to use for only two weeks at a time.   famotidine (PEPCID) 40 MG tablet Take 40 mg by mouth daily as needed.  (Patient not taking: Reported on 01/29/2022)   glucosamine-chondroitin 500-400 MG tablet Take 1 tablet by mouth 3 (three) times daily.   glucose blood (ONETOUCH ULTRA) test strip Use to check sugar twice daily for type 2 diabetes E11.9   irbesartan (AVAPRO) 75 MG tablet TAKE 1 TABLET BY MOUTH DAILY   Lancets (ONETOUCH ULTRASOFT) lancets Use to check sugar twice daily for type 2 diabetes E11.9   metFORMIN (GLUCOPHAGE-XR) 500 MG 24 hr tablet Take 1 tablet (500 mg total) by mouth daily with breakfast.   Multiple Vitamin (MULTIVITAMIN) tablet Take 1 tablet by mouth daily.   Naproxen Sodium 220 MG CAPS Take by mouth daily as needed.   omalizumab Arvid Right) 150 MG/ML prefilled syringe Inject 300 mg into the skin every 14 (fourteen) days.   Omega 3 1200 MG CAPS Take 2 capsules by mouth daily.   predniSONE (DELTASONE) 10 MG tablet Take four tabs daily for three days, three tabs daily for three days, two tabs daily for three days, one tab  daily for three days, and then STOP. (Patient not taking: Reported on 01/29/2022)   sertraline (ZOLOFT) 50 MG tablet TAKE 2 TABLETS BY MOUTH  DAILY   simvastatin (ZOCOR) 40 MG tablet Take 40 mg by mouth at bedtime.   traZODone (DESYREL) 50 MG tablet Take 1-2 tablets (50-100 mg total) by mouth at bedtime.   Ubiquinol 200 MG CAPS Take by mouth. CoQ10   Facility-Administered Medications Prior to Visit  Medication Dose Route Frequency Provider   omalizumab Arvid Right) injection 300 mg  300 mg Subcutaneous Q28 days Valentina Shaggy, MD    Review of  Systems  Constitutional:  Negative for appetite change, chills, fatigue and fever.  Respiratory:  Negative for chest tightness and shortness of breath.   Cardiovascular:  Negative for chest pain and palpitations.  Gastrointestinal:  Negative for abdominal pain, nausea and vomiting.  Neurological:  Negative for dizziness and weakness.       Objective    BP 128/88   Pulse 88   Resp 16   Wt 154 lb 3.2 oz (69.9 kg)   SpO2 98% Comment: room air  BMI 29.14 kg/m    Physical Exam   General: Appearance:    Well developed, well nourished female in no acute distress  Eyes:    PERRL, conjunctiva/corneas clear, EOM's intact       Lungs:     Clear to auscultation bilaterally, respirations unlabored  Heart:    Normal heart rate. Normal rhythm. No murmurs, rubs, or gallops.    MS:   All extremities are intact.    Neurologic:   Awake, alert, oriented x 3. No apparent focal neurological defect.         POCT HgB A1C  Result Value Ref Range   Hemoglobin A1C 6.2 (A) 4.0 - 5.6 %   Est. average glucose Bld gHb Est-mCnc 131     Assessment & Plan     1. Diabetes mellitus without complication ([place]) She would like try Ozempic. Counseled on all contraindications and common potential adverse effects.  - Semaglutide,0.25 or 0.5MG /DOS, 2 MG/3ML SOPN; 0.25mg  once a week for 4 weeks then increase to 0.5mg   Dispense: 9 mL; Refill: 2 - Urine Microalbumin w/creat. ratio  2. Essential (primary) hypertension Well controlled.  Continue current medications.   - Lipid panel - TSH       The entirety of the information documented in the History of Present Illness, Review of Systems and Physical Exam were personally obtained by me. Portions of this information were initially documented by the CMA and reviewed by me for thoroughness and accuracy.     Lelon Huh, MD  [place] [phone] (phone) [phone] (fax)  [place]

## 2022-04-15 LAB — MICROALBUMIN / CREATININE URINE RATIO
Creatinine, Urine: 25.1 mg/dL
Microalb/Creat Ratio: 36 mg/g creat — ABNORMAL HIGH (ref 0–29)
Microalbumin, Urine: 9.1 ug/mL

## 2022-04-16 DIAGNOSIS — J3489 Other specified disorders of nose and nasal sinuses: Secondary | ICD-10-CM | POA: Diagnosis not present

## 2022-04-16 DIAGNOSIS — J324 Chronic pansinusitis: Secondary | ICD-10-CM | POA: Diagnosis not present

## 2022-04-17 ENCOUNTER — Other Ambulatory Visit: Payer: Self-pay | Admitting: Family Medicine

## 2022-04-17 DIAGNOSIS — I10 Essential (primary) hypertension: Secondary | ICD-10-CM | POA: Diagnosis not present

## 2022-04-17 DIAGNOSIS — E119 Type 2 diabetes mellitus without complications: Secondary | ICD-10-CM | POA: Diagnosis not present

## 2022-04-17 NOTE — Telephone Encounter (Signed)
Requested medication (s) are due for refill today: yes  Requested medication (s) are on the active medication list: yes  Last refill:  multiple dates  Future visit scheduled: yes  Notes to clinic:  Unable to refill per protocol, last refill by another provider.      Requested Prescriptions  Pending Prescriptions Disp Refills   simvastatin (ZOCOR) 40 MG tablet 30 tablet     Sig: Take 1 tablet (40 mg total) by mouth at bedtime.     Cardiovascular:  Antilipid - Statins Failed - 04/17/2022  3:24 PM      Failed - Lipid Panel in normal range within the last 12 months    Cholesterol, Total  Date Value Ref Range Status  03/14/2021 127 100 - 199 mg/dL Final   LDL Chol Calc (NIH)  Date Value Ref Range Status  03/14/2021 56 0 - 99 mg/dL Final   HDL  Date Value Ref Range Status  03/14/2021 29 (L) >39 mg/dL Final   Triglycerides  Date Value Ref Range Status  03/14/2021 267 (H) 0 - 149 mg/dL Final         Passed - Patient is not pregnant      Passed - Valid encounter within last 12 months    Recent Outpatient Visits           4 days ago Diabetes mellitus without complication ([place])   [place], Donald E, MD   3 months ago Diabetes mellitus without complication [facility])   [place], Donald E, MD   5 months ago Sebaceous cyst   [place], Donald E, MD   8 months ago Diabetes mellitus without complication [facility])   [place] Myles Gip, DO   1 year ago Chronic ITP (idiopathic thrombocytopenia) [facility])   [place] Birdie Sons, MD       Future Appointments             In 2 months Ernst Bowler Gwenith Daily, MD [place] [facility] [place] [facility]             irbesartan (AVAPRO) 75 MG tablet 60 tablet 1    Sig: Take 1 tablet (75 mg total) by mouth daily.     Cardiovascular:  Angiotensin  Receptor Blockers Passed - 04/17/2022  3:24 PM      Passed - Cr in normal range and within 180 days    Creatinine, Ser  Date Value Ref Range Status  01/29/2022 0.73 0.44 - 1.00 mg/dL Final         Passed - K in normal range and within 180 days    Potassium  Date Value Ref Range Status  01/29/2022 3.5 3.5 - 5.1 mmol/L Final         Passed - Patient is not pregnant      Passed - Last BP in normal range    BP Readings from Last 1 Encounters:  04/13/22 128/88         Passed - Valid encounter within last 6 months    Recent Outpatient Visits           4 days ago Diabetes mellitus without complication [facility])   [place] Birdie Sons, MD   3 months ago Diabetes mellitus without complication [facility])   [place], MD   5 months  ago Sebaceous cyst   [place], MD   8 months ago Diabetes mellitus without complication [facility])   [place], DO   1 year ago Chronic ITP (idiopathic thrombocytopenia) [facility])   [place], Donald E, MD       Future Appointments             In 2 months Ernst Bowler, Gwenith Daily, MD [place] [facility] [place] [facility]

## 2022-04-17 NOTE — Telephone Encounter (Signed)
Medication Refill - Medication: simvastatin (ZOCOR) 40 MG tablet [id number] irbesartan (AVAPRO) 75 MG tablet [id number]   Has the patient contacted their pharmacy? Yes.    (Agent: If yes, when and what did the pharmacy advise?) Contact PCP.   Preferred Pharmacy (with phone number or street name): [facility] ([place], [place]   Has the patient been seen for an appointment in the last year OR does the patient have an upcoming appointment? Yes.    Agent: Please be advised that RX refills may take up to 3 business days. We ask that you follow-up with your pharmacy.   Pt says she will be out of simvastatin after tomorrow. Pt is requesting a call back when the medication has been sent.

## 2022-04-18 LAB — LIPID PANEL
Chol/HDL Ratio: 3.9 ratio (ref 0.0–4.4)
Cholesterol, Total: 129 mg/dL (ref 100–199)
HDL: 33 mg/dL — ABNORMAL LOW (ref >39)
LDL Chol Calc (NIH): 59 mg/dL (ref 0–99)
Triglycerides: 227 mg/dL — ABNORMAL HIGH (ref 0–149)
VLDL Cholesterol Cal: 37 mg/dL (ref 5–40)

## 2022-04-18 LAB — TSH: TSH: 4.13 u[IU]/mL (ref 0.450–4.500)

## 2022-04-18 MED ORDER — SIMVASTATIN 40 MG PO TABS: 40.0000 mg | ORAL_TABLET | Freq: Every day | ORAL | 1 refills | Status: DC

## 2022-04-18 MED ORDER — IRBESARTAN 75 MG PO TABS: 75.0000 mg | ORAL_TABLET | Freq: Every day | ORAL | 1 refills | Status: DC

## 2022-04-22 DIAGNOSIS — J324 Chronic pansinusitis: Secondary | ICD-10-CM | POA: Diagnosis not present

## 2022-04-22 DIAGNOSIS — J329 Chronic sinusitis, unspecified: Secondary | ICD-10-CM | POA: Diagnosis not present

## 2022-04-30 DIAGNOSIS — R519 Headache, unspecified: Secondary | ICD-10-CM | POA: Diagnosis not present

## 2022-05-02 DIAGNOSIS — D234 Other benign neoplasm of skin of scalp and neck: Secondary | ICD-10-CM | POA: Diagnosis not present

## 2022-05-02 DIAGNOSIS — R208 Other disturbances of skin sensation: Secondary | ICD-10-CM | POA: Diagnosis not present

## 2022-05-02 DIAGNOSIS — L728 Other follicular cysts of the skin and subcutaneous tissue: Secondary | ICD-10-CM | POA: Diagnosis not present

## 2022-05-15 NOTE — Progress Notes (Signed)
Vivien Rota DeSanto,acting as a scribe for Mila Merry, MD.,have documented all relevant documentation on the behalf of Mila Merry, MD,as directed by  Mila Merry, MD while in the presence of Mila Merry, MD.     Established patient visit   Patient: Julia Garner   DOB: 10/21/1950   72 y.o. Female  MRN: [id number] Visit Date: 05/16/2022  Today's healthcare provider: Mila Merry, MD   No chief complaint on file.  Subjective    HPI  Patient is a 72 year old female who presents requesting referral to neurology.  Patient states she has a pain behind her left eye which causes headaches.  She has been having symptoms for about 1 year.  She has been to see the ENT and they did a CT scan and ruled out her sinuses.  She has been treating it with decongestant and states that if she does not take the decongestant the pain is much worse.  She denies any visual disturbance and states she saw her eye doctor last year and her vision was ok.    She state that pain goes away when she takes a decongestant. She had been taking 24 hour Claritin for years, but over the last few months has been taking two a day, but pharmacy will not dispense more without a prescription.   Patient had a seizure one time more than 5 years ago and had seen neurology.  She states they never knew for sure what caused the seizure and she never had another.  She said they thought it may have been from her glucose as it was 21 at that time.    Medications: Outpatient Medications Prior to Visit  Medication Sig   ascorbic acid (VITAMIN C) 500 MG tablet Take 500 mg by mouth daily.   Blood Glucose Monitoring Suppl (ONE TOUCH ULTRA 2) w/Device KIT Use to check sugar daily for type 2 diabetes E11.9   clobetasol ointment (TEMOVATE) 0.05 % Apply 1 Application topically 2 (two) times daily. Try to use for only two weeks at a time.   glucosamine-chondroitin 500-400 MG tablet Take 1 tablet by mouth 3 (three) times daily.    glucose blood (ONETOUCH ULTRA) test strip Use to check sugar twice daily for type 2 diabetes E11.9   irbesartan (AVAPRO) 75 MG tablet Take 1 tablet (75 mg total) by mouth at bedtime.   Lancets (ONETOUCH ULTRASOFT) lancets Use to check sugar twice daily for type 2 diabetes E11.9   loratadine-pseudoephedrine (CLARITIN-D 24-HOUR) 10-240 MG 24 hr tablet Take 1 tablet by mouth daily.   metFORMIN (GLUCOPHAGE-XR) 500 MG 24 hr tablet Take 1 tablet (500 mg total) by mouth daily with breakfast.   Multiple Vitamin (MULTIVITAMIN) tablet Take 1 tablet by mouth daily.   Naproxen Sodium 220 MG CAPS Take by mouth daily as needed.   omalizumab Geoffry Paradise) 150 MG/ML prefilled syringe Inject 300 mg into the skin every 14 (fourteen) days.   Omega 3 1200 MG CAPS Take 2 capsules by mouth daily.   Semaglutide,0.25 or 0.5MG /DOS, 2 MG/3ML SOPN 0.25mg  once a week for 4 weeks then increase to 0.5mg    sertraline (ZOLOFT) 50 MG tablet TAKE 2 TABLETS BY MOUTH  DAILY   simvastatin (ZOCOR) 40 MG tablet Take 1 tablet (40 mg total) by mouth at bedtime.   traZODone (DESYREL) 50 MG tablet Take 1-2 tablets (50-100 mg total) by mouth at bedtime.   Ubiquinol 200 MG CAPS Take by mouth. CoQ10   albuterol (VENTOLIN HFA) 108 (90 Base)  MCG/ACT inhaler Inhale 2 puffs into the lungs every 6 (six) hours as needed for wheezing or shortness of breath. (Patient not taking: Reported on 01/29/2022)   cetirizine (ZYRTEC) 10 MG tablet Take 20 mg by mouth at bedtime as needed.  (Patient not taking: Reported on 04/13/2022)   famotidine (PEPCID) 40 MG tablet Take 40 mg by mouth daily as needed.  (Patient not taking: Reported on 01/29/2022)   Facility-Administered Medications Prior to Visit  Medication Dose Route Frequency Provider   omalizumab Geoffry Paradise) injection 300 mg  300 mg Subcutaneous Q28 days Alfonse Spruce, MD    Review of Systems  Eyes:  Positive for pain. Negative for photophobia, discharge, redness, itching and visual disturbance.   Neurological:  Positive for headaches. Negative for dizziness and light-headedness.       Objective    BP (!) 153/106 (BP Location: Right Arm, Patient Position: Sitting, Cuff Size: Normal)   Pulse 86   Temp 98.1 F (36.7 C) (Oral)   Wt 155 lb (70.3 kg)   SpO2 99%   BMI 29.29 kg/m  Vitals:   05/16/22 1007 05/16/22 1015  BP: (!) 174/97 (!) 153/106  Pulse: 86   Temp: 98.1 F (36.7 C)   TempSrc: Oral   SpO2: 99%   Weight: 155 lb (70.3 kg)     Physical Exam  General appearance: Well developed, well nourished female, cooperative and in no acute distress Head: Normocephalic, without obvious abnormality, atraumatic Respiratory: Respirations even and unlabored, normal respiratory rate Extremities: All extremities are intact.  Skin: Skin color, texture, turgor normal. No rashes seen  Psych: Appropriate mood and affect. Neurologic: Mental status: Alert, oriented to person, place, and time, thought content appropriate.   Assessment & Plan     1. Chronic intractable headache, unspecified headache type Reviewed CT sinuses from 04/22/2022 ordered by Dr. Gwenlyn Fudge (ENT) which was only remarkable for right septal deviation and osseous spur, but otherwise normal.   - Ambulatory referral to Neurology  2. History of seizure  - Ambulatory referral to Neurology  3. Other seasonal allergic rhinitis She states that OTC decongestants are the only thing that help her headaches, but has increase 24 hour Claritin D to BID. Counseled about extensive potential serious adverse effects of decongestants and that the typically lose efficacy over time.   Will start  fluticasone (FLONASE) 50 MCG/ACT nasal spray; Place 2 sprays into both nostrils daily.  Dispense: 16 g; Refill: 3  For the time being, will change from Claritin D to fexofenadine-pseudoephedrine (ALLEGRA-D ALLERGY & CONGESTION) 180-240 MG 24 hr tablet; Take 1 tablet by mouth daily.  Dispense: 30 tablet; Refill: 0  4. Essential (primary)  hypertension She reports home blood pressure are typically much better than today's office reading. Again counseled on potential for decongestants she is taking to raise blood pressure.       The entirety of the information documented in the History of Present Illness, Review of Systems and Physical Exam were personally obtained by me. Portions of this information were initially documented by the CMA and reviewed by me for thoroughness and accuracy.     Mila Merry, MD  [facility] [phone] (phone) [phone] (fax)  [facility]

## 2022-05-16 ENCOUNTER — Ambulatory Visit (INDEPENDENT_AMBULATORY_CARE_PROVIDER_SITE_OTHER): Payer: Medicare Other | Admitting: Family Medicine

## 2022-05-16 VITALS — BP 153/106 | HR 86 | Temp 98.1°F | Wt 155.0 lb

## 2022-05-16 DIAGNOSIS — J302 Other seasonal allergic rhinitis: Secondary | ICD-10-CM

## 2022-05-16 DIAGNOSIS — R519 Headache, unspecified: Secondary | ICD-10-CM

## 2022-05-16 DIAGNOSIS — Z87898 Personal history of other specified conditions: Secondary | ICD-10-CM

## 2022-05-16 DIAGNOSIS — G8929 Other chronic pain: Secondary | ICD-10-CM | POA: Diagnosis not present

## 2022-05-16 DIAGNOSIS — I10 Essential (primary) hypertension: Secondary | ICD-10-CM

## 2022-05-16 MED ORDER — FEXOFENADINE-PSEUDOEPHED ER 180-240 MG PO TB24
1.0000 | ORAL_TABLET | Freq: Every day | ORAL | 0 refills | Status: DC
Start: 2022-05-16 — End: 2022-07-11

## 2022-05-16 MED ORDER — FLUTICASONE PROPIONATE 50 MCG/ACT NA SUSP
2.0000 | Freq: Every day | NASAL | 3 refills | Status: DC
Start: 2022-05-16 — End: 2022-09-19

## 2022-05-17 ENCOUNTER — Other Ambulatory Visit: Payer: Self-pay | Admitting: Family Medicine

## 2022-05-17 MED ORDER — METFORMIN HCL ER 500 MG PO TB24: 500.0000 mg | ORAL_TABLET | Freq: Every day | ORAL | 0 refills | Status: DC

## 2022-05-17 NOTE — Addendum Note (Signed)
Addended by: Wilford Corner on: 05/17/2022 11:50 AM   Modules accepted: Orders

## 2022-05-17 NOTE — Telephone Encounter (Signed)
Requested Prescriptions  Pending Prescriptions Disp Refills   metFORMIN (GLUCOPHAGE-XR) 500 MG 24 hr tablet 90 tablet 0    Sig: Take 1 tablet (500 mg total) by mouth daily with breakfast.     Endocrinology:  Diabetes - Biguanides Failed - 05/17/2022 11:47 AM      Failed - B12 Level in normal range and within 720 days    Vitamin B-12  Date Value Ref Range Status  09/10/2016 306 180 - 914 pg/mL Final    Comment:    (NOTE) This assay is not validated for testing neonatal or myeloproliferative syndrome specimens for Vitamin B12 levels. Performed at [facility], [address] [place], [place] [address]          Passed - Cr in normal range and within 360 days    Creatinine, Ser  Date Value Ref Range Status  01/29/2022 0.73 0.44 - 1.00 mg/dL Final         Passed - HBA1C is between 0 and 7.9 and within 180 days    Hemoglobin A1C  Date Value Ref Range Status  04/13/2022 6.2 (A) 4.0 - 5.6 % Final   Hgb A1c MFr Bld  Date Value Ref Range Status  08/08/2021 6.3 (H) 4.8 - 5.6 % Final    Comment:             Prediabetes: 5.7 - 6.4          Diabetes: >6.4          Glycemic control for adults with diabetes: <7.0          Passed - eGFR in normal range and within 360 days    GFR calc Af Amer  Date Value Ref Range Status  10/26/2019 90 >59 mL/min/1.73 Final    Comment:    **[facility] currently reports eGFR in compliance with the current**   recommendations of the [employer]. [facility] will   update reporting as new guidelines are published from the NKF-ASN   Task force.    GFR, Estimated  Date Value Ref Range Status  01/29/2022 >60 >60 mL/min Final    Comment:    (NOTE) Calculated using the CKD-EPI Creatinine Equation (2021)    eGFR  Date Value Ref Range Status  08/08/2021 96 >59 mL/min/1.73 Final         Passed - Valid encounter within last 6 months    Recent Outpatient Visits           Yesterday Other seasonal allergic rhinitis   [place]  [facility] Malva Limes, MD   1 month ago Diabetes mellitus without complication [facility])   [place] [facility] Malva Limes, MD   4 months ago Diabetes mellitus without complication [facility])   [facility] Malva Limes, MD   6 months ago Sebaceous cyst   [place] [facility] Malva Limes, MD   9 months ago Diabetes mellitus without complication [facility])   [place] [facility] [place], Darl Householder, DO       Future Appointments             In 1 month Dellis Anes Hetty Ely, MD  [facility] - CBC within normal limits and completed in the last 12 months    WBC  Date Value Ref Range Status  01/29/2022 10.2 4.0 -  10.5 K/uL Final   RBC  Date Value Ref Range Status  01/29/2022 4.60 3.87 - 5.11 MIL/uL Final   Hemoglobin  Date Value Ref Range Status  01/29/2022 13.4 12.0 - 15.0 g/dL Final  16/10/9602 54.0 11.1 - 15.9 g/dL Final   HCT  Date Value Ref Range Status  01/29/2022 40.2 36.0 - 46.0 % Final   Hematocrit  Date Value Ref Range Status  08/08/2021 40.7 34.0 - 46.6 % Final   MCHC  Date Value Ref Range Status  01/29/2022 33.3 30.0 - 36.0 g/dL Final   [facility]  Date Value Ref Range Status  01/29/2022 29.1 26.0 - 34.0 pg Final   MCV  Date Value Ref Range Status  01/29/2022 87.4 80.0 - 100.0 fL Final  08/08/2021 86 79 - 97 fL Final   No results found for: "PLTCOUNTKUC", "LABPLAT", "POCPLA" RDW  Date Value Ref Range Status  01/29/2022 13.5 11.5 - 15.5 % Final  08/08/2021 13.3 11.7 - 15.4 % Final

## 2022-05-17 NOTE — Telephone Encounter (Signed)
Sent to wrong pharmacy initially, res[facility].

## 2022-05-17 NOTE — Telephone Encounter (Signed)
Medication Refill - Medication: Metformin 500 mg  Has the patient contacted their pharmacy? Yes.  She said pharmacy told her to call the office  (Agent: If no, request that the patient contact the pharmacy for the refill. If patient does not wish to contact the pharmacy document the reason why and proceed with request.) (Agent: If yes, when and what did the pharmacy advise?)  Preferred Pharmacy (with phone number or street name[facility]  Has the patient been seen for an appointment in the last year OR does the patient have an upcoming appointment? yes  Agent: Please be advised that RX refills may take up to 3 business days. We ask that you follow-up with your pharmacy.

## 2022-05-21 ENCOUNTER — Encounter: Payer: Self-pay | Admitting: Family Medicine

## 2022-05-24 ENCOUNTER — Telehealth: Payer: Self-pay | Admitting: Family Medicine

## 2022-05-24 ENCOUNTER — Telehealth: Payer: Self-pay

## 2022-05-24 NOTE — Telephone Encounter (Signed)
Pt is calling to report that the script for fexofenadine-pseudoephedrine (ALLEGRA-D ALLERGY & CONGESTION) 180-240 MG 24 hr tablet [[id number]] . Pt is taking 2 per day. And this has only last her 15 days. Pt is requesting script to be increased. Please advise [phone]

## 2022-05-24 NOTE — Telephone Encounter (Signed)
Copied from CRM [id number]. Topic: Referral - Request for Referral >> May 24, 2022  3:39 PM Everette C wrote: Has patient seen PCP for this complaint? Yes.   *If NO, is insurance requiring patient see PCP for this issue before PCP can refer them? Referral for which specialty: Neurology  Preferred provider/office: [facility] would like [facility] in [facility]  Reason for referral: headaches

## 2022-05-25 ENCOUNTER — Telehealth: Payer: Self-pay

## 2022-05-25 DIAGNOSIS — J302 Other seasonal allergic rhinitis: Secondary | ICD-10-CM

## 2022-05-25 DIAGNOSIS — R519 Headache, unspecified: Secondary | ICD-10-CM

## 2022-05-25 NOTE — Telephone Encounter (Unsigned)
Copied from CRM [id number]. Topic: Referral - Request for Referral >> May 25, 2022 12:35 PM Patsy Lager T wrote: Has patient seen PCP for this complaint? Yes.   *If NO, is insurance requiring patient see PCP for this issue before PCP can refer them? Referral for which specialty: Neurology Preferred provider/office: [facility] [place] Reason for referral: headaches and pain behind her eye

## 2022-05-27 NOTE — Telephone Encounter (Signed)
Need more info. A referral was placed on 05-16-2022, but according to the referral notes she has appt with Francine Graven on 11/12/2022 which patient declined because the referral note said she already has appt with a different neurologist.

## 2022-05-28 MED ORDER — AZELASTINE HCL 0.1 % NA SOLN
2.0000 | Freq: Two times a day (BID) | NASAL | 1 refills | Status: DC
Start: 2022-05-28 — End: 2022-09-18

## 2022-05-28 NOTE — Telephone Encounter (Signed)
Can take azelastine nasal spray in addition to flonase, have sent prescription to pharmacy.

## 2022-05-28 NOTE — Telephone Encounter (Signed)
Have placed new referral to [facility] [facility]. Maximum dose of allegra D is one tablet daily.

## 2022-05-29 DIAGNOSIS — G5602 Carpal tunnel syndrome, left upper limb: Secondary | ICD-10-CM | POA: Diagnosis not present

## 2022-05-29 NOTE — Telephone Encounter (Signed)
Maximum dose is 1 per day. See telephone message

## 2022-05-29 NOTE — Telephone Encounter (Signed)
Patient advised. Verbalized understanding

## 2022-06-17 ENCOUNTER — Other Ambulatory Visit: Payer: Self-pay | Admitting: Family Medicine

## 2022-06-19 NOTE — Telephone Encounter (Signed)
Requested Prescriptions  Refused Prescriptions Disp Refills   irbesartan (AVAPRO) 75 MG tablet [Pharmacy Med Name: IRBESARTAN  75MG   TAB] 100 tablet 2    Sig: TAKE 1 TABLET BY MOUTH AT  BEDTIME     Cardiovascular:  Angiotensin Receptor Blockers Failed - 06/17/2022 10:51 PM      Failed - Last BP in normal range    BP Readings from Last 1 Encounters:  05/16/22 (!) 153/106         Passed - Cr in normal range and within 180 days    Creatinine, Ser  Date Value Ref Range Status  01/29/2022 0.73 0.44 - 1.00 mg/dL Final         Passed - K in normal range and within 180 days    Potassium  Date Value Ref Range Status  01/29/2022 3.5 3.5 - 5.1 mmol/L Final         Passed - Patient is not pregnant      Passed - Valid encounter within last 6 months    Recent Outpatient Visits           1 month ago Other seasonal allergic rhinitis   [place] [facility] Mal[facility], [facility]   2 months ago Diabetes mellitus without complication [facility])   [facility], MD   5 months ago Diabetes mellitus without complication [facility])   [facility] Malva Limes, MD   7 months ago Sebaceous cyst   [place] [facility] Malva Limes, MD   10 months ago Diabetes mellitus without complication [facility])   [place] [facility] Rumball, Darl Householder, DO       Future Appointments             In 2 weeks Dellis Anes, Hetty Ely, MD  [facility] [place] [facility]

## 2022-06-22 ENCOUNTER — Other Ambulatory Visit: Payer: Self-pay | Admitting: Family Medicine

## 2022-06-25 ENCOUNTER — Other Ambulatory Visit: Payer: Self-pay | Admitting: Family Medicine

## 2022-06-25 NOTE — Telephone Encounter (Signed)
Requested Prescriptions  Refused Prescriptions Disp Refills   simvastatin (ZOCOR) 40 MG tablet [Pharmacy Med Name: Simvastatin 40 MG Oral Tablet] 100 tablet 2    Sig: TAKE 1 TABLET BY MOUTH AT  BEDTIME     Cardiovascular:  Antilipid - Statins Failed - 06/22/2022 10:29 PM      Failed - Lipid Panel in normal range within the last 12 months    Cholesterol, Total  Date Value Ref Range Status  04/17/2022 129 100 - 199 mg/dL Final   LDL Chol Calc (NIH)  Date Value Ref Range Status  04/17/2022 59 0 - 99 mg/dL Final   HDL  Date Value Ref Range Status  04/17/2022 33 (L) >39 mg/dL Final   Triglycerides  Date Value Ref Range Status  04/17/2022 227 (H) 0 - 149 mg/dL Final         Passed - Patient is not pregnant      Passed - Valid encounter within last 12 months    Recent Outpatient Visits           1 month ago Other seasonal allergic rhinitis   [place] [facility], MD   2 months ago Diabetes mellitus without complication [facility])   [place] [facility] Mal[facility] L[facility], MD   5 months ago Diabetes mellitus without complication [facility])   [facility], MD   7 months ago Sebaceous cyst   [place] [facility] Malva Limes, MD   10 months ago Diabetes mellitus without complication [facility])   [place] [facility] Rumball, Darl Householder, DO       Future Appointments             In 1 week Dellis Anes, Hetty Ely, MD [place] [facility] [place] [facility]

## 2022-06-25 NOTE — Telephone Encounter (Signed)
Pt called back upset about her refill request getting denied, she says it takes over a week to receive her medicine when it is ordered from [facility]. She says she will be completely out by 06/27/2022.   Osie Bond, RN     06/25/22  7:57 AM Note       Requested Prescriptions  Refused Prescriptions Disp Refills   simvastatin (ZOCOR) 40 MG tablet [Pharmacy Med Name: Simvastatin 40 MG Oral Tablet] 100 tablet 2      Sig: TAKE 1 TABLET BY MOUTH AT  BEDTIME       Cardiovascular:  Antilipid - Statins Failed - 06/22/2022 10:29 PM         Failed - Lipid Panel in normal range within the last 12 months           Cholesterol, Total  Date Value Ref Range Status  04/17/2022 129 100 - 199 mg/dL Final         LDL Chol Calc (NIH)  Date Value Ref Range Status  04/17/2022 59 0 - 99 mg/dL Final         HDL  Date Value Ref Range Status  04/17/2022 33 (L) >39 mg/dL Final         Triglycerides  Date Value Ref Range Status  04/17/2022 227 (H) 0 - 149 mg/dL Final              Passed - Patient is not pregnant         Passed - Valid encounter within last 12 months      Recent Outpatient Visits              1 month ago Other seasonal allergic rhinitis    [place] [facility] Malva Limes, MD    2 months ago Diabetes mellitus without complication [facility])    [facility], MD    5 months ago Diabetes mellitus without complication [facility])    [place] [facility] Malva L[facility], MD    7 months ago Sebaceous cyst    [facility] Mal[facility] L[facility], MD    10 months ago Diabetes mellitus without complication [facility])    [place] [facility] Rumball, Darl Householder, DO         Future Appointments                In 1 week Dellis Anes, Hetty Ely, MD  [facility] [place] [facility]

## 2022-06-26 MED ORDER — SIMVASTATIN 40 MG PO TABS: 40.0000 mg | ORAL_TABLET | Freq: Every day | ORAL | 1 refills | Status: DC

## 2022-06-26 NOTE — Telephone Encounter (Signed)
Requested Prescriptions  Pending Prescriptions Disp Refills   simvastatin (ZOCOR) 40 MG tablet 90 tablet 1    Sig: Take 1 tablet (40 mg total) by mouth at bedtime.     Cardiovascular:  Antilipid - Statins Failed - 06/25/2022  4:12 PM      Failed - Lipid Panel in normal range within the last 12 months    Cholesterol, Total  Date Value Ref Range Status  04/17/2022 129 100 - 199 mg/dL Final   LDL Chol Calc (NIH)  Date Value Ref Range Status  04/17/2022 59 0 - 99 mg/dL Final   HDL  Date Value Ref Range Status  04/17/2022 33 (L) >39 mg/dL Final   Triglycerides  Date Value Ref Range Status  04/17/2022 227 (H) 0 - 149 mg/dL Final         Passed - Patient is not pregnant      Passed - Valid encounter within last 12 months    Recent Outpatient Visits           1 month ago Other seasonal allergic rhinitis   [place] [facility] Mal[facility] L[facility], MD   2 months ago Diabetes mellitus without complication [facility])   [place] [facility] Malva Limes, MD   5 months ago Diabetes mellitus without complication [facility])   [place] [facility] Malva Limes, MD   7 months ago Sebaceous cyst   [place] [facility] Malva L[facility], MD   10 months ago Diabetes mellitus without complication [facility])   [place] [facility] Rumball, Darl Householder, DO       Future Appointments             In 1 week Dellis Anes, Hetty Ely, MD [place] [facility] [place] [facility]

## 2022-06-28 DIAGNOSIS — Z01818 Encounter for other preprocedural examination: Secondary | ICD-10-CM | POA: Diagnosis not present

## 2022-06-30 ENCOUNTER — Other Ambulatory Visit: Payer: Self-pay | Admitting: Family Medicine

## 2022-06-30 DIAGNOSIS — G4700 Insomnia, unspecified: Secondary | ICD-10-CM

## 2022-07-02 NOTE — Telephone Encounter (Signed)
Please advise 

## 2022-07-03 ENCOUNTER — Ambulatory Visit: Payer: Medicare Other | Admitting: Allergy & Immunology

## 2022-07-03 ENCOUNTER — Other Ambulatory Visit: Payer: Self-pay

## 2022-07-03 ENCOUNTER — Encounter: Payer: Self-pay | Admitting: Allergy & Immunology

## 2022-07-03 VITALS — BP 148/90 | HR 89 | Temp 98.6°F | Ht 61.0 in | Wt 153.3 lb

## 2022-07-03 DIAGNOSIS — R21 Rash and other nonspecific skin eruption: Secondary | ICD-10-CM

## 2022-07-03 DIAGNOSIS — B999 Unspecified infectious disease: Secondary | ICD-10-CM

## 2022-07-03 DIAGNOSIS — L508 Other urticaria: Secondary | ICD-10-CM

## 2022-07-03 DIAGNOSIS — J4520 Mild intermittent asthma, uncomplicated: Secondary | ICD-10-CM | POA: Diagnosis not present

## 2022-07-03 NOTE — Progress Notes (Signed)
FOLLOW UP  Date of Service/Encounter:  07/05/22   Assessment:   Chronic urticaria - controlled with Xolair monthly, but with some breakthrough episodes   New onset rash - ? contact dermatitis   Recurrent infections - mostly sinopulmonary in nature (increased from 11 out of 23 serotypes protection to 17 out of 23 serotypes protection)   Chronic ITP - with round of rituximab years ago (weekly x 4 doses), now controlled with intermittent prednisone bursts   Intermittent asthma, uncomplicated - controlled with PRN albuterol   Plan/Recommendations:   1. Hives - Try spacing the Xolair to every month. - We will not change the script though, in case we need to go back to every other week.  - Call us with any problems.   2. Recurrent infections - Resolved with Pneumovax. - She responded well to this. - No further workup needed.  3. Intermittent asthma, uncomplicated - She is doing well with albuterol as needed. - There is no need for controller medication.  4. Return in about 6 months (around 01/02/2023).   Subjective:   Julia Garner is a 72 y.o. female presenting today for follow up of  Chief Complaint  Patient presents with   Follow-up    No concerns    Julia Garner has a history of the following: Patient Active Problem List   Diagnosis Date Noted   Hypocalcemia 01/29/2022   Leukocytosis 07/25/2021   COVID 07/25/2021   Uncontrolled type 2 diabetes mellitus with hyperglycemia, without long-term current use of insulin (HCC) 07/24/2021   Hypokalemia 07/24/2021   Carpal tunnel syndrome 07/20/2021   Connective tissue disease (HCC) 07/20/2021   Hyperplastic colon polyp    SVT (supraventricular tachycardia) 06/08/2020   Bilateral carpal tunnel syndrome 07/31/2019   Aortic atherosclerosis (HCC) 11/04/2018   Fatty liver 11/04/2018   Chronic urticaria 08/15/2018   Chronic ITP (idiopathic thrombocytopenia) (HCC) 08/15/2018   Recurrent infections 08/15/2018    Diabetes mellitus without complication (HCC) 09/15/2017   Other seasonal allergic rhinitis 06/29/2014   Insomnia 06/29/2014   Arthralgia of temporomandibular joint 06/29/2014   Edema 06/29/2014   Oral aphthae 07/08/2007   Cutaneous lipodystrophy 06/11/2007   Arthropathy of hand 01/25/2007   Fam hx-ischem heart disease 01/21/2007   Menopausal and postmenopausal disorder 10/08/2005   Thrombocytopenia (HCC) 10/08/2005   Obstructive sleep apnea of adult 10/24/2004   Essential (primary) hypertension 01/16/2004   Mood disorder (HCC) 01/15/2002    History obtained from: chart review and patient.  Julia Garner is a 72 y.o. female presenting for a follow up visit.  We last saw her in November 2023.  At that time, we started her on a prednisone taper for new rash.  We also gave her clobetasol to use twice daily as needed.  She was otherwise doing well on Xolair every 28 days.  Asthma/Respiratory Symptom History: She was seen by Julia Garner.  Breathing is under much better control.  She uses albuterol as needed.  She has not needed prednisone at all.  She was admitted to the hospital in July 2023 around 1 year ago for sepsis.  This was following contraction of COVID-19 while she was on hajj to [place].  Allergic Rhinitis Symptom History: She reports that she has a lot of ocular pressure. MRI was normal. She is being referred to see Neurology for possible migraines. Sinuses have apparently been clear. She was placed on Allegra D, but this is not helping much at all. She has a Neurology appt with Julia Garner  in August 2024.   Skin Symptom History: She is doing her Xolair every 2 weeks. She thinks that this is working well. She is fine with giving it to herself. She has been doing it every two weeks for a year or so.   She has not had a flare of ITP in a while. She takes prednisone as needed.   She had a very exciting year.  Both of her sons got married last year.  She has 2 sons.  One of the sons  recently had a child.  She also has a daughter.  Otherwise, there have been no changes to her past medical history, surgical history, family history, or social history.    Review of Systems  Constitutional:  Negative for chills, fever, malaise/fatigue and weight loss.  HENT: Negative.  Negative for congestion, ear discharge, ear pain and sinus pain.   Eyes:  Negative for pain, discharge and redness.  Respiratory:  Negative for cough, sputum production, shortness of breath and wheezing.   Cardiovascular: Negative.  Negative for chest pain and palpitations.  Gastrointestinal:  Negative for abdominal pain, constipation, diarrhea, heartburn, nausea and vomiting.  Skin:  Positive for itching and rash.  Neurological:  Negative for dizziness and headaches.  Endo/Heme/Allergies:  Positive for environmental allergies. Does not bruise/bleed easily.       Objective:   Blood pressure (!) 148/90, pulse 89, temperature 98.6 F (37 C), height 5\' 1"  (1.549 m), weight 153 lb 4.8 oz (69.5 kg), SpO2 98 %. Body mass index is 28.97 kg/m.    Physical Exam Vitals reviewed.  Constitutional:      Appearance: She is well-developed.     Comments: Very lovely person. Talkative.   HENT:     Head: Normocephalic and atraumatic.     Right Ear: Tympanic membrane, ear canal and external ear normal.     Left Ear: Tympanic membrane, ear canal and external ear normal.     Nose: No nasal deformity, septal deviation, mucosal edema or rhinorrhea.     Right Turbinates: Enlarged, swollen and pale.     Left Turbinates: Enlarged, swollen and pale.     Right Sinus: No maxillary sinus tenderness or frontal sinus tenderness.     Left Sinus: No maxillary sinus tenderness or frontal sinus tenderness.     Mouth/Throat:     Mouth: Mucous membranes are not pale and not dry.     Pharynx: Uvula midline.  Eyes:     General: Lids are normal. No allergic shiner.       Right eye: No discharge.        Left eye: No discharge.      Conjunctiva/sclera: Conjunctivae normal.     Right eye: Right conjunctiva is not injected. No chemosis.    Left eye: Left conjunctiva is not injected. No chemosis.    Pupils: Pupils are equal, round, and reactive to light.  Cardiovascular:     Rate and Rhythm: Normal rate and regular rhythm.     Heart sounds: Normal heart sounds.  Pulmonary:     Effort: Pulmonary effort is normal. No tachypnea, accessory muscle usage or respiratory distress.     Breath sounds: Normal breath sounds. No wheezing, rhonchi or rales.     Comments: Moving air well in all lung fields. No increased work of breathing noted.  Chest:     Chest wall: No tenderness.  Lymphadenopathy:     Cervical: No cervical adenopathy.  Skin:    General: Skin is warm.  Capillary Refill: Capillary refill takes less than 2 seconds.     Coloration: Skin is not pale.     Findings: No abrasion, erythema, petechiae or rash. Rash is not papular, urticarial or vesicular.     Comments: No urticarial lesions.  Rash from last visit has disappeared.  Neurological:     Mental Status: She is alert.  Psychiatric:        Behavior: Behavior is cooperative.      Diagnostic studies: none       Malachi Bonds, MD  [facility] [place]

## 2022-07-03 NOTE — Patient Instructions (Addendum)
1. Hives - Try spacing the Xolair to every month. - We will not change the script though, in case we need to go back to every other weeks.  - Call us with any problems.   2. Return in about 6 months (around 01/02/2023).    Please inform us of any Emergency Department visits, hospitalizations, or changes in symptoms. Call us before going to the ED for breathing or allergy symptoms since we might be able to fit you in for a sick visit. Feel free to contact us anytime with any questions, problems, or concerns.  It was a pleasure to see you again today!  Websites that have reliable patient information: 1. American Academy of Asthma, Allergy, and Immunology: www.aaaai.org 2. Food Allergy Research and Education (FARE): foodallergy.org 3. Mothers of Asthmatics: http://www.asthmacommunitynetwork.org 4. American College of Allergy, Asthma, and Immunology: www.acaai.org   COVID-19 Vaccine Information can be found at: [email or web address].[email or web address] For questions related to vaccine distribution or appointments, please email [email or web address] or call [phone].     "Like" [place] on Facebook and Instagram for our latest updates!        Make sure you are registered to vote! If you have moved or changed any of your contact information, you will need to get this updated before voting!  In some cases, you MAY be able to register to vote online: [email or web address]

## 2022-07-05 ENCOUNTER — Encounter: Payer: Self-pay | Admitting: Allergy & Immunology

## 2022-07-05 DIAGNOSIS — G5602 Carpal tunnel syndrome, left upper limb: Secondary | ICD-10-CM | POA: Diagnosis not present

## 2022-07-11 ENCOUNTER — Other Ambulatory Visit: Payer: Self-pay | Admitting: Family Medicine

## 2022-07-11 ENCOUNTER — Ambulatory Visit: Payer: Self-pay | Admitting: *Deleted

## 2022-07-11 ENCOUNTER — Other Ambulatory Visit: Payer: Self-pay | Admitting: *Deleted

## 2022-07-11 DIAGNOSIS — J302 Other seasonal allergic rhinitis: Secondary | ICD-10-CM

## 2022-07-11 NOTE — Telephone Encounter (Signed)
Reason for Disposition  Rectal bleeding is a chronic symptom (recurrent or ongoing AND present > 4 weeks)  Answer Assessment - Initial Assessment Questions 1. APPEARANCE of BLOOD: "What color is it?" "Is it passed separately, on the surface of the stool, or mixed in with the stool?"      See blood on tissue when wipes after stool 2. AMOUNT: "How much blood was passed?"      Depends- can be red- lighter, amount varies too 3. FREQUENCY: "How many times has blood been passed with the stools?"      Comes and goes 4. ONSET: "When was the blood first seen in the stools?" (Days or weeks)      2-3 months 5. DIARRHEA: "Is there also some diarrhea?" If Yes, ask: "How many diarrhea stools in the past 24 hours?"      no 6. CONSTIPATION: "Do you have constipation?" If Yes, ask: "How bad is it?"     Yes- normal problem- bleeding is not all the time with constipation 7. RECURRENT SYMPTOMS: "Have you had blood in your stools before?" If Yes, ask: "When was the last time?" and "What happened that time?"      no 8. BLOOD THINNERS: "Do you take any blood thinners?" (e.g., Coumadin/warfarin, Pradaxa/dabigatran, aspirin)     no 9. OTHER SYMPTOMS: "Do you have any other symptoms?"  (e.g., abdomen pain, vomiting, dizziness, fever)     no  Protocols used: Rectal Bleeding-A-AH

## 2022-07-11 NOTE — Telephone Encounter (Signed)
  Chief Complaint: on/off rectal bleeding Symptoms: patient states she has chronic constipation and will occasionally see blood on tissue after bowels move.  Frequency: ongoing 2-3 months Pertinent Negatives: Patient denies abdomen pain, vomiting, dizziness, fever  Disposition: [] ED /[] Urgent Care (no appt availability in office) / [] Appointment(In office/virtual)/ []  [place] [facility]/ [] Home Care/ [] Refused Recommended Disposition /[]  Mobile Bus/ [x]  Follow-up with PCP Additional Notes: Patient declines to see another provider- she only wants to see PCP-first available appointment is 1 month out- patient advised I would have to send message to office to see if she can be seen with disposition- 2 weeks. Patient states that is fine.

## 2022-07-12 NOTE — Telephone Encounter (Signed)
Requested medication (s) are due for refill today:   Provider to review  Requested medication (s) are on the active medication list:   Yes  Future visit scheduled:   No   Last ordered: 05/16/2022 #30, 0 refills  Non delegated refill    Requested Prescriptions  Pending Prescriptions Disp Refills   fexofenadine-pseudoephedrine (ALLEGRA-D ALLERGY & CONGESTION) 180-240 MG 24 hr tablet 30 tablet 0    Sig: Take 1 tablet by mouth daily.     Not Delegated - Ear, Nose, and Throat:  Combination Products with Pseudoephedrine Failed - 07/11/2022 12:55 PM      Failed - This refill cannot be delegated      Failed - Last BP in normal range    BP Readings from Last 1 Encounters:  07/03/22 (!) 148/90         Passed - Valid encounter within last 12 months    Recent Outpatient Visits           1 month ago Other seasonal allergic rhinitis   [place] [facility] Malva Limes, MD   3 months ago Diabetes mellitus without complication [facility])   [place] [facility] Malva Limes, MD   6 months ago Diabetes mellitus without complication [facility])    [facility] Malva Limes, MD   8 months ago Sebaceous cyst   [place] [facility] Malva Limes, MD   11 months ago Diabetes mellitus without complication [facility])   [facility] Caro Laroche, DO

## 2022-07-12 NOTE — Telephone Encounter (Signed)
Rx 05/17/22 #90 - too soon Requested Prescriptions  Pending Prescriptions Disp Refills   metFORMIN (GLUCOPHAGE-XR) 500 MG 24 hr tablet [Pharmacy Med Name: metFORMIN HCl ER 500 MG Oral Tablet Extended Release 24 Hour] 90 tablet 3    Sig: TAKE 1 TABLET BY MOUTH DAILY  WITH BREAKFAST     Endocrinology:  Diabetes - Biguanides Failed - 07/11/2022 10:23 PM      Failed - B12 Level in normal range and within 720 days    Vitamin B-12  Date Value Ref Range Status  09/10/2016 306 180 - 914 pg/mL Final    Comment:    (NOTE) This assay is not validated for testing neonatal or myeloproliferative syndrome specimens for Vitamin B12 levels. Performed at [facility], [address] [place], [place] [address]          Passed - Cr in normal range and within 360 days    Creatinine, Ser  Date Value Ref Range Status  01/29/2022 0.73 0.44 - 1.00 mg/dL Final         Passed - HBA1C is between 0 and 7.9 and within 180 days    Hemoglobin A1C  Date Value Ref Range Status  04/13/2022 6.2 (A) 4.0 - 5.6 % Final   Hgb A1c MFr Bld  Date Value Ref Range Status  08/08/2021 6.3 (H) 4.8 - 5.6 % Final    Comment:             Prediabetes: 5.7 - 6.4          Diabetes: >6.4          Glycemic control for adults with diabetes: <7.0          Passed - eGFR in normal range and within 360 days    GFR calc Af Amer  Date Value Ref Range Status  10/26/2019 90 >59 mL/min/1.73 Final    Comment:    **[facility] currently reports eGFR in compliance with the current**   recommendations of the [employer]. [facility] will   update reporting as new guidelines are published from the NKF-ASN   Task force.    GFR, Estimated  Date Value Ref Range Status  01/29/2022 >60 >60 mL/min Final    Comment:    (NOTE) Calculated using the CKD-EPI Creatinine Equation (2021)    eGFR  Date Value Ref Range Status  08/08/2021 96 >59 mL/min/1.73 Final         Passed - Valid encounter within last 6 months    Recent  Outpatient Visits           1 month ago Other seasonal allergic rhinitis   [place] [facility] Malva Limes, MD   3 months ago Diabetes mellitus without complication [facility])   [place] [facility] Malva Limes, MD   6 months ago Diabetes mellitus without complication [facility])   [place] [facility] Malva Limes, MD   8 months ago Sebaceous cyst   [facility] Malva Limes, MD   11 months ago Diabetes mellitus without complication [facility])   [place] [facility] [place] Dense M, DO              Passed - CBC within normal limits and completed in the last 12 months    WBC  Date Value Ref Range Status  01/29/2022 10.2 4.0 - 10.5 K/uL Final   RBC  Date Value Ref Range Status  01/29/2022 4.60  3.87 - 5.11 MIL/uL Final   Hemoglobin  Date Value Ref Range Status  01/29/2022 13.4 12.0 - 15.0 g/dL Final  16/10/9602 54.0 11.1 - 15.9 g/dL Final   HCT  Date Value Ref Range Status  01/29/2022 40.2 36.0 - 46.0 % Final   Hematocrit  Date Value Ref Range Status  08/08/2021 40.7 34.0 - 46.6 % Final   MCHC  Date Value Ref Range Status  01/29/2022 33.3 30.0 - 36.0 g/dL Final   [facility]  Date Value Ref Range Status  01/29/2022 29.1 26.0 - 34.0 pg Final   MCV  Date Value Ref Range Status  01/29/2022 87.4 80.0 - 100.0 fL Final  08/08/2021 86 79 - 97 fL Final   No results found for: "PLTCOUNTKUC", "LABPLAT", "POCPLA" RDW  Date Value Ref Range Status  01/29/2022 13.5 11.5 - 15.5 % Final  08/08/2021 13.3 11.7 - 15.4 % Final

## 2022-07-13 ENCOUNTER — Other Ambulatory Visit: Payer: Self-pay

## 2022-07-13 MED ORDER — FEXOFENADINE-PSEUDOEPHED ER 180-240 MG PO TB24
1.0000 | ORAL_TABLET | Freq: Every day | ORAL | 0 refills | Status: DC
Start: 2022-07-13 — End: 2022-09-28

## 2022-07-19 ENCOUNTER — Other Ambulatory Visit: Payer: Self-pay | Admitting: Family Medicine

## 2022-07-24 ENCOUNTER — Ambulatory Visit: Payer: Self-pay

## 2022-07-24 NOTE — Patient Outreach (Signed)
  Care Coordination   Initial Visit Note   07/24/2022 Name: Julia Garner MRN: [id number] DOB: 03/20/1950  Julia Garner is a 72 y.o. year old female who sees Fisher, Demetrios Isaacs, MD for primary care. I spoke with  Julia Garner by phone today.  What matters to the patients health and wellness today?  CM educated patient on [facility] services.  Patient declines services and feels that she is able to manage her medical needs.  Patient agreed to contact her provider in the future if she needs [facility] services.    Goals Addressed   None     SDOH assessments and interventions completed:  No     Care Coordination Interventions:  No, not indicated   Follow up plan: No further intervention required.   Encounter Outcome:  Pt. Refused

## 2022-07-30 ENCOUNTER — Inpatient Hospital Stay: Payer: Medicare Other | Attending: Oncology

## 2022-07-30 ENCOUNTER — Encounter: Payer: Self-pay | Admitting: Oncology

## 2022-07-30 ENCOUNTER — Inpatient Hospital Stay: Payer: Medicare Other | Admitting: Oncology

## 2022-07-30 VITALS — BP 141/86 | HR 75 | Temp 97.6°F | Resp 18 | Wt 155.6 lb

## 2022-07-30 DIAGNOSIS — D693 Immune thrombocytopenic purpura: Secondary | ICD-10-CM

## 2022-07-30 LAB — CBC WITH DIFFERENTIAL/PLATELET
Abs Immature Granulocytes: 0.03 10*3/uL (ref 0.00–0.07)
Basophils Absolute: 0 10*3/uL (ref 0.0–0.1)
Basophils Relative: 0 %
Eosinophils Absolute: 0.4 10*3/uL (ref 0.0–0.5)
Eosinophils Relative: 5 %
HCT: 41.4 % (ref 36.0–46.0)
Hemoglobin: 13.7 g/dL (ref 12.0–15.0)
Immature Granulocytes: 0 %
Lymphocytes Relative: 47 %
Lymphs Abs: 3.6 10*3/uL (ref 0.7–4.0)
MCH: 29.1 pg (ref 26.0–34.0)
MCHC: 33.1 g/dL (ref 30.0–36.0)
MCV: 87.9 fL (ref 80.0–100.0)
Monocytes Absolute: 0.7 10*3/uL (ref 0.1–1.0)
Monocytes Relative: 9 %
Neutro Abs: 3 10*3/uL (ref 1.7–7.7)
Neutrophils Relative %: 39 %
Platelets: 148 10*3/uL — ABNORMAL LOW (ref 150–400)
RBC: 4.71 MIL/uL (ref 3.87–5.11)
RDW: 12.8 % (ref 11.5–15.5)
WBC: 7.7 10*3/uL (ref 4.0–10.5)
nRBC: 0 % (ref 0.0–0.2)

## 2022-07-30 LAB — COMPREHENSIVE METABOLIC PANEL
ALT: 23 U/L (ref 0–44)
AST: 23 U/L (ref 15–41)
Albumin: 3.9 g/dL (ref 3.5–5.0)
Alkaline Phosphatase: 52 U/L (ref 38–126)
Anion gap: 8 (ref 5–15)
BUN: 10 mg/dL (ref 8–23)
CO2: 26 mmol/L (ref 22–32)
Calcium: 8.7 mg/dL — ABNORMAL LOW (ref 8.9–10.3)
Chloride: 104 mmol/L (ref 98–111)
Creatinine, Ser: 0.73 mg/dL (ref 0.44–1.00)
GFR, Estimated: >60 mL/min (ref >60)
Glucose, Bld: 101 mg/dL — ABNORMAL HIGH (ref 70–99)
Potassium: 3.7 mmol/L (ref 3.5–5.1)
Sodium: 138 mmol/L (ref 135–145)
Total Bilirubin: 0.4 mg/dL (ref 0.3–1.2)
Total Protein: 7.8 g/dL (ref 6.5–8.1)

## 2022-07-30 NOTE — Assessment & Plan Note (Signed)
continue calcium supplementation 1200 mg daily.  She may also take vitamin D 1000 units daily.

## 2022-07-30 NOTE — Assessment & Plan Note (Signed)
Previous has rituximab treatments Labs are reviewed and discussed with patient. Platelet count is stable, mildly decreased.   Follow up in 6 months

## 2022-07-30 NOTE — Progress Notes (Signed)
Hematology/Oncology Progress note [phone] [phone]    Patient Care Team: Malva Limes, MD as PCP - General (Family Medicine) Isla Pence, OD (Optometry) Wende Crease, MD as Referring Physician (Otolaryngology)  REASON FOR VISIT Follow up for thrombocytopenia  ASSESSMENT & PLAN:  Chronic ITP (idiopathic thrombocytopenia) (HCC) Previous has rituximab treatments Labs are reviewed and discussed with patient. Platelet count is stable, mildly decreased.   Follow up in 6 months  Hypocalcemia continue calcium supplementation 1200 mg daily.  She may also take vitamin D 1000 units daily.  Orders Placed This Encounter  Procedures   CBC with Differential (Cancer Center Only)    Standing Status:   Future    Standing Expiration Date:   07/30/2023   CMP (Cancer Center only)    Standing Status:   Future    Standing Expiration Date:   07/30/2023   Follow up in 6 months.  All questions were answered. The patient knows to call the clinic with any problems, questions or concerns.  Rickard Patience, MD, PhD [facility] Hematology Oncology 07/30/2022     PERTINENT HEMATOLOGY HISTORY Julia Garner 72 y.o.  female who has a history of recurrent ITP. Before she was referred to me, she has a long history of ITP which were treated with sterids by primary care physician and she used to respond to steroids. She was seen by anther Hematologist Dr.Pandit many years ago. Dr.Pandit has left [place].  She had labs doneon 08/22/2016. Platelet counts were slow at 15,000. She was treated with a short course (5 days) of Steroids by Dr.Fisher and platelet recovered to 120,000, however later dropped again to 11,000 on 09/10/2016.  I treated her with a longer course of prednisone with slow tapering and her counts again responded to treatment. During the tapering the course,she visited home country [place] and had blood work done there after she finished the tapering  course of Prednisone there. She reports the platelet counts were normal in [place].   Patient has had lab work up including negative hepatitis, HIV, normal LDH, normal B12 and folate level. ANA was positive and she was evaluated by rheumatologist. Based on my phone discussion with Rheumatologist, patient has had rheumatology work up and was not considered to have any autoimmune problems. She does not have hepatosplenomegaly.   Patient returned to clinic in December and repeat blood work on 12/26/2016 showed platelet count of 16,000. Treated with a course of Dexamethasone 40mg  x 4 days, platelet again responded and dropped again.  And she reports easy bruising, feeling tired.  Peripheral blood flowcytometry 1% of analyzed cells containing clonal B cell population which are CD5- and CD 10-.  There is mildly increased eosinophils 7%.   Patient does not have any palpable lymphadenopathy on physical examination and we obtained CT which showed no pathological lymphadenopathy. Bone marrow biopsy showed hypercellular marrow with trilineage hematopoiesis including increased megakaryocytes. No evidence of lymphoproliferative process involvement. Normal cytogenetics.  At this point patient has had extensive work up for her thrombocytopenia, and we discussed in lengthy that she most likely have ITP, there maybe a low grade lymphoma too. Given that she has recurrent ITP and platelet counts cannot be maintained without steroids, I suggest Rituximab weekly x 4 for consolidation. Patient has requested me to talk to her son in law Dr.Kamron who is a nephrologist in . I have talked to Dr.Kamron about the rationale of using Rituximab and also the side effects profile. Dr.Kamron agrees the plan and  has relayed information to patient. I have also discussed the side effects of Rituximab with patient. She agrees with treatment.   # severe bilateral carpal tunnel syndrome and received joint steroid injections.  INTERVAL  HISTORY Julia Garner is a 72 y.o. female who has above hematology history reviewed by me today presents for follow up visit for ITP.  Patient reports feeling well no new complaints.    Review of Systems  Constitutional:  Negative for appetite change, chills, fatigue and fever.  HENT:   Negative for hearing loss and voice change.   Eyes:  Negative for eye problems.  Respiratory:  Negative for chest tightness and cough.   Cardiovascular:  Negative for chest pain.  Gastrointestinal:  Negative for abdominal distention, abdominal pain and blood in stool.  Endocrine: Negative for hot flashes.  Genitourinary:  Negative for difficulty urinating and frequency.   Musculoskeletal:  Negative for arthralgias.  Skin:  Negative for itching and rash.  Neurological:  Negative for extremity weakness.  Hematological:  Negative for adenopathy.  Psychiatric/Behavioral:  Negative for confusion.     MEDICAL HISTORY:  Past Medical History:  Diagnosis Date   Allergy    Chronic ITP (idiopathic thrombocytopenia) (HCC)    Chronic urticaria 08/15/2018   Depression    Head injuries    History of seizure 2007   one seizure    Hypertension     SURGICAL HISTORY: Past Surgical History:  Procedure Laterality Date   CATARACT EXTRACTION, BILATERAL  around 2021   Dr. Clydene Pugh   CESAREAN SECTION  1990   CHOLECYSTECTOMY  2005   COLONOSCOPY WITH PROPOFOL N/A 12/20/2020   Procedure: COLONOSCOPY WITH PROPOFOL;  Surgeon: Midge Minium, MD;  Location: [facility];  Service: Endoscopy;  Laterality: N/A;   CYST EXCISION  1996   Tracheal Cyst    SOCIAL HISTORY: Social History   Socioeconomic History   Marital status: Married    Spouse name: Not on file   Number of children: 3   Years of education: Not on file   Highest education level: Not on file  Occupational History   Occupation: retired    Comment: Physical Therapist  Tobacco Use   Smoking status: Never   Smokeless tobacco: Never  Vaping Use    Vaping status: Never Used  Substance and Sexual Activity   Alcohol use: No    Alcohol/week: 0.0 standard drinks of alcohol   Drug use: No   Sexual activity: Not on file  Other Topics Concern   Not on file  Social History Narrative   Not on file   Social Determinants of Health   Financial Resource Strain: Low Risk  (11/08/2021)   Overall Financial Resource Strain (CARDIA)    Difficulty of Paying Living Expenses: Not hard at all  Food Insecurity: No Food Insecurity (11/08/2021)   Hunger Vital Sign    Worried About Running Out of Food in the Last Year: Never true    Ran Out of Food in the Last Year: Never true  Transportation Needs: No Transportation Needs (11/08/2021)   PRAPARE - Administrator, Civil Service (Medical): No    Lack of Transportation (Non-Medical): No  Physical Activity: Inactive (11/08/2021)   Exercise Vital Sign    Days of Exercise per Week: 0 days    Minutes of Exercise per Session: 0 min  Stress: Stress Concern Present (11/08/2021)   [employer] of Occupational Health - Occupational Stress Questionnaire    Feeling of Stress : To some extent  Social Connections: Moderately Integrated (11/08/2021)   Social Connection and Isolation Panel [NHANES]    Frequency of Communication with Friends and Family: More than three times a week    Frequency of Social Gatherings with Friends and Family: More than three times a week    Attends Religious Services: More than 4 times per year    Active Member of [employer] or Organizations: No    Attends Banker Meetings: Never    Marital Status: Married  Catering manager Violence: Not At Risk (11/08/2021)   Humiliation, Afraid, Rape, and Kick questionnaire    Fear of Current or Ex-Partner: No    Emotionally Abused: No    Physically Abused: No    Sexually Abused: No    FAMILY HISTORY: Family History  Problem Relation Age of Onset   Heart attack Father        1st MI at age 31    ALLERGIES:  is  allergic to niacin and related, penicillins, statins, and tylenol  [acetaminophen].  MEDICATIONS:  Current Outpatient Medications  Medication Sig Dispense Refill   ascorbic acid (VITAMIN C) 500 MG tablet Take 500 mg by mouth daily.     azelastine (ASTELIN) 0.1 % nasal spray Place 2 sprays into both nostrils 2 (two) times daily. Take in addition to fluticasone nasal spray 30 mL 1   Blood Glucose Monitoring Suppl (ONE TOUCH ULTRA 2) w/Device KIT Use to check sugar daily for type 2 diabetes E11.9 1 kit 0   clobetasol ointment (TEMOVATE) 0.05 % Apply 1 Application topically 2 (two) times daily. Try to use for only two weeks at a time. 30 g 2   fexofenadine-pseudoephedrine (ALLEGRA-D ALLERGY & CONGESTION) 180-240 MG 24 hr tablet Take 1 tablet by mouth daily. 30 tablet 0   fluticasone (FLONASE) 50 MCG/ACT nasal spray Place 2 sprays into both nostrils daily. 16 g 3   glucosamine-chondroitin 500-400 MG tablet Take 1 tablet by mouth 3 (three) times daily.     glucose blood (ONETOUCH ULTRA) test strip Use to check sugar twice daily for type 2 diabetes E11.9 100 each 4   irbesartan (AVAPRO) 75 MG tablet Take 1 tablet (75 mg total) by mouth at bedtime. 90 tablet 1   Lancets (ONETOUCH ULTRASOFT) lancets Use to check sugar twice daily for type 2 diabetes E11.9 100 each 4   metFORMIN (GLUCOPHAGE-XR) 500 MG 24 hr tablet Take 1 tablet (500 mg total) by mouth daily with breakfast. OFFICE VISIT NEEDED FOR ADDITIONAL REFILLS 90 tablet 0   Multiple Vitamin (MULTIVITAMIN) tablet Take 1 tablet by mouth daily.     Naproxen Sodium 220 MG CAPS Take by mouth daily as needed.     omalizumab Geoffry Paradise) 150 MG/ML prefilled syringe Inject 300 mg into the skin every 14 (fourteen) days. 12 mL 3   Omega 3 1200 MG CAPS Take 2 capsules by mouth daily.     Semaglutide,0.25 or 0.5MG /DOS, 2 MG/3ML SOPN 0.25mg  once a week for 4 weeks then increase to 0.5mg  9 mL 2   sertraline (ZOLOFT) 50 MG tablet TAKE 2 TABLETS BY MOUTH DAILY 200  tablet 2   simvastatin (ZOCOR) 40 MG tablet Take 1 tablet (40 mg total) by mouth at bedtime. 90 tablet 1   traZODone (DESYREL) 50 MG tablet TAKE 1 TO 2 TABLETS BY MOUTH AT  BEDTIME 180 tablet 3   Ubiquinol 200 MG CAPS Take by mouth. CoQ10     albuterol (VENTOLIN HFA) 108 (90 Base) MCG/ACT inhaler Inhale 2 puffs into  the lungs every 6 (six) hours as needed for wheezing or shortness of breath. (Patient not taking: Reported on 01/29/2022) 18 g 0   cetirizine (ZYRTEC) 10 MG tablet Take 20 mg by mouth at bedtime as needed.  (Patient not taking: Reported on 04/13/2022)     famotidine (PEPCID) 40 MG tablet Take 40 mg by mouth daily as needed.  (Patient not taking: Reported on 01/29/2022)     Current Facility-Administered Medications  Medication Dose Route Frequency Provider Last Rate Last Admin   omalizumab Geoffry Paradise) injection 300 mg  300 mg Subcutaneous Q28 days Alfonse Spruce, MD   300 mg at 09/29/19 1523      .  PHYSICAL EXAMINATION: ECOG PERFORMANCE STATUS: 0 - Asymptomatic Vitals:   07/30/22 1429  BP: (!) 141/86  Pulse: 75  Resp: 18  Temp: 97.6 F (36.4 C)  SpO2: 98%   Filed Weights   07/30/22 1429  Weight: 155 lb 9.6 oz (70.6 kg)  Physical Exam Constitutional:      General: She is not in acute distress.    Appearance: She is not diaphoretic.  HENT:     Head: Normocephalic and atraumatic.  Eyes:     General: No scleral icterus. Neck:     Vascular: No JVD.  Cardiovascular:     Rate and Rhythm: Normal rate and regular rhythm.  Pulmonary:     Effort: Pulmonary effort is normal. No respiratory distress.     Breath sounds: Normal breath sounds. No wheezing.  Abdominal:     General: Bowel sounds are normal. There is no distension.     Palpations: Abdomen is soft.  Musculoskeletal:        General: Normal range of motion.     Cervical back: Normal range of motion and neck supple.  Skin:    Findings: No rash.  Neurological:     Mental Status: She is alert and oriented to  person, place, and time. Mental status is at baseline.     Cranial Nerves: No cranial nerve deficit.     Motor: No abnormal muscle tone.  Psychiatric:        Mood and Affect: Mood and affect normal.        Cognition and Memory: Memory normal.          LABORATORY DATA:  I have reviewed the data as listed Lab Results  Component Value Date   WBC 7.7 07/30/2022   HGB 13.7 07/30/2022   HCT 41.4 07/30/2022   MCV 87.9 07/30/2022   PLT 148 (L) 07/30/2022   Recent Labs    08/08/21 1409 01/29/22 1431 07/30/22 1411  NA 140 136 138  K 3.9 3.5 3.7  CL 102 101 104  CO2 22 26 26   GLUCOSE 92 100* 101*  BUN 10 11 10   CREATININE 0.64 0.73 0.73  CALCIUM 9.3 8.5* 8.7*  GFRNONAA  --  >60 >60  PROT 7.1 6.7 7.8  ALBUMIN 3.7* 3.6 3.9  AST 28 21 23   ALT 30 25 23   ALKPHOS 62 52 52  BILITOT <0.2 0.3 0.4   RADIOGRAPHIC STUDIES: I have personally reviewed the radiological images as listed and agreed with the findings in the report. No results found.

## 2022-07-31 DIAGNOSIS — H43813 Vitreous degeneration, bilateral: Secondary | ICD-10-CM | POA: Diagnosis not present

## 2022-07-31 DIAGNOSIS — H5213 Myopia, bilateral: Secondary | ICD-10-CM | POA: Diagnosis not present

## 2022-07-31 DIAGNOSIS — H1045 Other chronic allergic conjunctivitis: Secondary | ICD-10-CM | POA: Diagnosis not present

## 2022-07-31 DIAGNOSIS — E119 Type 2 diabetes mellitus without complications: Secondary | ICD-10-CM | POA: Diagnosis not present

## 2022-08-14 ENCOUNTER — Other Ambulatory Visit: Payer: Self-pay | Admitting: Family Medicine

## 2022-08-14 ENCOUNTER — Telehealth: Payer: Self-pay | Admitting: Allergy & Immunology

## 2022-08-14 DIAGNOSIS — E119 Type 2 diabetes mellitus without complications: Secondary | ICD-10-CM

## 2022-08-14 NOTE — Telephone Encounter (Signed)
Ok I am letting Tammy know. That should not be a problem.   Malachi Bonds, MD [facility]

## 2022-08-14 NOTE — Telephone Encounter (Signed)
Patient states she will be going back to every other week with the xolair. She attempted to space it out but it is not working.   Best contact number: [phone]

## 2022-08-14 NOTE — Telephone Encounter (Unsigned)
Medication Refill - Medication: Blood Glucose Monitoring Suppl test strips (ONE TOUCH ULTRA 2)   Has the patient contacted their pharmacy? Yes.   (Agent: If no, request that the patient contact the pharmacy for the refill. If patient does not wish to contact the pharmacy document the reason why and proceed with request.) (Agent: If yes, when and what did the pharmacy advise?)  Preferred Pharmacy (with phone number or street name): [facility] - [place], [place] - [address] [place]  Has the patient been seen for an appointment in the last year OR does the patient have an upcoming appointment? Yes.    Agent: Please be advised that RX refills may take up to 3 business days. We ask that you follow-up with your pharmacy.

## 2022-08-14 NOTE — Telephone Encounter (Signed)
Have not changed script she should be able to order with no issues

## 2022-08-15 MED ORDER — ONETOUCH ULTRA VI STRP
ORAL_STRIP | 4 refills | Status: AC
Start: 2022-08-15 — End: ?

## 2022-08-15 NOTE — Telephone Encounter (Signed)
Called and left a message for patient to call our office back to inform her  of the note per Dr. Dellis Anes and Babette Relic.

## 2022-08-15 NOTE — Telephone Encounter (Signed)
Requested medication (s) are due for refill today: Yes  Requested medication (s) are on the active medication list: Yes  Last refill:  09/01/19  Future visit scheduled: No  Notes to clinic:  Unable to refill per protocol, last refill by another provider.      Requested Prescriptions  Pending Prescriptions Disp Refills   glucose blood (ONETOUCH ULTRA) test strip 100 each 4    Sig: Use to check sugar twice daily for type 2 diabetes E11.9     Endocrinology: Diabetes - Testing Supplies Passed - 08/15/2022 12:41 PM      Passed - Valid encounter within last 12 months    Recent Outpatient Visits           3 months ago Other seasonal allergic rhinitis   [place] [facility] Malva Limes, MD   4 months ago Diabetes mellitus without complication [facility])   [place] [facility] Malva Limes, MD   7 months ago Diabetes mellitus without complication [facility])   [place] [facility] Malva Limes, MD   9 months ago Sebaceous cyst   [facility] Malva Limes, MD   1 year ago Diabetes mellitus without complication [facility])   [facility] Caro Laroche, DO

## 2022-08-17 ENCOUNTER — Telehealth: Payer: Self-pay

## 2022-08-17 NOTE — Telephone Encounter (Signed)
Copied from CRM [id number]. Topic: General - Other >> Aug 17, 2022 11:14 AM Turkey B wrote: Reason for CRM: pt called in, wants to discuss being  put on Ozempic

## 2022-08-20 ENCOUNTER — Ambulatory Visit: Payer: Self-pay

## 2022-08-20 NOTE — Telephone Encounter (Signed)
Summary: medicaiton direcitons   Pt states that she picked up her Ozempic medication but the directions is not clear and would like to speak with a nurse to help her go over how to take the medication.     Called pt - left message to return our call.  I will forward encounter to office for follow up - no contact.

## 2022-08-20 NOTE — Telephone Encounter (Signed)
Summary: medicaiton direcitons   Pt states that she picked up her Ozempic medication but the directions is not clear and would like to speak with a nurse to help her go over how to take the medication.      Called pt - left message on machine to return call.

## 2022-08-20 NOTE — Telephone Encounter (Signed)
Need more info, was prescribed Ozempic at her visit in March

## 2022-08-20 NOTE — Telephone Encounter (Signed)
Summary: medicaiton direcitons   Pt states that she picked up her Ozempic medication but the directions is not clear and would like to speak with a nurse to help her go over how to take the medication.         Called pt - left message to return our call.

## 2022-08-21 NOTE — Telephone Encounter (Signed)
Dosing clarified with patient and she verbalized understanding

## 2022-08-23 NOTE — Telephone Encounter (Signed)
Patient made aware.

## 2022-08-28 NOTE — Progress Notes (Unsigned)
Referring:  Malva Limes, MD [address] [place],  [place] [address]  PCP: Malva Limes, MD  Neurology was asked to evaluate Julia Garner, a 72 year old female for a chief complaint of headaches.  Our recommendations of care will be communicated by shared medical record.    CC:  headaches  History provided from self  HPI:  Medical co-morbidities: HTN, SVT, DM2, ITP  The patient presents for evaluation of headaches which began 1 year ago. They are described as pain/pressure behind her left eye. She has some element of pressure every day. Severity of pain fluctuates. Sometimes it is mild, other times it is severe. Headaches are not associated with photophobia, phonophobia, or nausea. Denies jaw pain or vision changes. CT of the sinuses on 05/12/22 did not show a cause for her headaches. Takes allergy medicine daily which she thinks is helpful. Also takes Advil as needed.  Has a history of one-time seizure 5 years ago. Glucose level was 21 at the time. No other cause for seizure was found.  Headache History: Onset: 1 year ago Aura: none Location: left retro-orbital Quality/Description: pressure Associated Symptoms:  Photophobia: no  Phonophobia: no  Nausea: no Worse with activity?: sometimes  Headache days per month: 30 Headache free days per month: 0  Current Treatment: Abortive Advil  Preventative none  Prior Therapies                                 Irbesartan 75 mg daily lisinopril Sertraline 100 mg daily   LABS: CBC    Component Value Date/Time   WBC 7.7 07/30/2022 1411   RBC 4.71 07/30/2022 1411   HGB 13.7 07/30/2022 1411   HGB 13.5 08/08/2021 1409   HCT 41.4 07/30/2022 1411   HCT 40.7 08/08/2021 1409   PLT 148 (L) 07/30/2022 1411   PLT 180 08/08/2021 1409   MCV 87.9 07/30/2022 1411   MCV 86 08/08/2021 1409   MCH 29.1 07/30/2022 1411   MCHC 33.1 07/30/2022 1411   RDW 12.8 07/30/2022 1411   RDW 13.3 08/08/2021 1409    LYMPHSABS 3.6 07/30/2022 1411   LYMPHSABS 4.1 (H) 08/08/2021 1409   MONOABS 0.7 07/30/2022 1411   EOSABS 0.4 07/30/2022 1411   EOSABS 0.5 (H) 08/08/2021 1409   BASOSABS 0.0 07/30/2022 1411   BASOSABS 0.1 08/08/2021 1409      Latest Ref Rng & Units 07/30/2022    2:11 PM 01/29/2022    2:31 PM 08/08/2021    2:09 PM  CMP  Glucose 70 - 99 mg/dL 191  478  92   BUN 8 - 23 mg/dL 10  11  10    Creatinine 0.44 - 1.00 mg/dL 2.95  6.21  3.08   Sodium 135 - 145 mmol/L 138  136  140   Potassium 3.5 - 5.1 mmol/L 3.7  3.5  3.9   Chloride 98 - 111 mmol/L 104  101  102   CO2 22 - 32 mmol/L 26  26  22    Calcium 8.9 - 10.3 mg/dL 8.7  8.5  9.3   Total Protein 6.5 - 8.1 g/dL 7.8  6.7  7.1   Total Bilirubin 0.3 - 1.2 mg/dL 0.4  0.3  <6.5   Alkaline Phos 38 - 126 U/L 52  52  62   AST 15 - 41 U/L 23  21  28    ALT 0 - 44 U/L 23  25  30       IMAGING:  CT sinuses 04/22/22: unremarkable  Current Outpatient Medications on File Prior to Visit  Medication Sig Dispense Refill   ascorbic acid (VITAMIN C) 500 MG tablet Take 500 mg by mouth daily.     Blood Glucose Monitoring Suppl (ONE TOUCH ULTRA 2) w/Device KIT Use to check sugar daily for type 2 diabetes E11.9 1 kit 0   clobetasol ointment (TEMOVATE) 0.05 % Apply 1 Application topically 2 (two) times daily. Try to use for only two weeks at a time. 30 g 2   fexofenadine-pseudoephedrine (ALLEGRA-D ALLERGY & CONGESTION) 180-240 MG 24 hr tablet Take 1 tablet by mouth daily. 30 tablet 0   fluticasone (FLONASE) 50 MCG/ACT nasal spray Place 2 sprays into both nostrils daily. 16 g 3   glucose blood (ONETOUCH ULTRA) test strip Use to check sugar twice daily for type 2 diabetes E11.9 100 each 4   irbesartan (AVAPRO) 75 MG tablet Take 1 tablet (75 mg total) by mouth at bedtime. 90 tablet 1   Lancets (ONETOUCH ULTRASOFT) lancets Use to check sugar twice daily for type 2 diabetes E11.9 100 each 4   Multiple Vitamin (MULTIVITAMIN) tablet Take 1 tablet by mouth daily.      Naproxen Sodium 220 MG CAPS Take by mouth daily as needed.     omalizumab Geoffry Paradise) 150 MG/ML prefilled syringe Inject 300 mg into the skin every 14 (fourteen) days. 12 mL 3   Omega 3 1200 MG CAPS Take 2 capsules by mouth daily.     Semaglutide,0.25 or 0.5MG /DOS, 2 MG/3ML SOPN 0.25mg  once a week for 4 weeks then increase to 0.5mg  9 mL 2   sertraline (ZOLOFT) 50 MG tablet TAKE 2 TABLETS BY MOUTH DAILY 200 tablet 2   simvastatin (ZOCOR) 40 MG tablet Take 1 tablet (40 mg total) by mouth at bedtime. 90 tablet 1   traZODone (DESYREL) 50 MG tablet TAKE 1 TO 2 TABLETS BY MOUTH AT  BEDTIME 180 tablet 3   Ubiquinol 200 MG CAPS Take by mouth. CoQ10     albuterol (VENTOLIN HFA) 108 (90 Base) MCG/ACT inhaler Inhale 2 puffs into the lungs every 6 (six) hours as needed for wheezing or shortness of breath. (Patient not taking: Reported on 01/29/2022) 18 g 0   azelastine (ASTELIN) 0.1 % nasal spray Place 2 sprays into both nostrils 2 (two) times daily. Take in addition to fluticasone nasal spray 30 mL 1   cetirizine (ZYRTEC) 10 MG tablet Take 20 mg by mouth at bedtime as needed.  (Patient not taking: Reported on 04/13/2022)     famotidine (PEPCID) 40 MG tablet Take 40 mg by mouth daily as needed.  (Patient not taking: Reported on 01/29/2022)     glucosamine-chondroitin 500-400 MG tablet Take 1 tablet by mouth 3 (three) times daily.     metFORMIN (GLUCOPHAGE-XR) 500 MG 24 hr tablet Take 1 tablet (500 mg total) by mouth daily with breakfast. OFFICE VISIT NEEDED FOR ADDITIONAL REFILLS 90 tablet 0   Current Facility-Administered Medications on File Prior to Visit  Medication Dose Route Frequency Provider Last Rate Last Admin   omalizumab Geoffry Paradise) injection 300 mg  300 mg Subcutaneous Q28 days Alfonse Spruce, MD   300 mg at 09/29/19 1523     Allergies: Allergies  Allergen Reactions   Niacin And Related     As component of simcor (flushing)   Penicillins Itching    Hot flashes. Other reaction(s): Not  available   Statins  Other reaction(s): Other (See Comments) other   Tylenol  [Acetaminophen]     Family History: Family History  Problem Relation Age of Onset   Heart attack Father        1st MI at age 39    Past Medical History: Past Medical History:  Diagnosis Date   Allergy    Chronic ITP (idiopathic thrombocytopenia) (HCC)    Chronic urticaria 08/15/2018   COVID 2023   Depression    Head injuries    History of seizure 2007   one seizure    Hypertension     Past Surgical History Past Surgical History:  Procedure Laterality Date   CATARACT EXTRACTION, BILATERAL  around 2021   Dr. Clydene Pugh   CESAREAN SECTION  1990   CHOLECYSTECTOMY  2005   COLONOSCOPY WITH PROPOFOL N/A 12/20/2020   Procedure: COLONOSCOPY WITH PROPOFOL;  Surgeon: Midge Minium, MD;  Location: [facility] ENDOSCOPY;  Service: Endoscopy;  Laterality: N/A;   CYST EXCISION  1996   Tracheal Cyst    Social History: Social History   Tobacco Use   Smoking status: Never   Smokeless tobacco: Never  Vaping Use   Vaping status: Never Used  Substance Use Topics   Alcohol use: No    Alcohol/week: 0.0 standard drinks of alcohol   Drug use: No    ROS: Negative for fevers, chills. Positive for headaches. All other systems reviewed and negative unless stated otherwise in HPI.   Physical Exam:   Vital Signs: Ht 5\' 1"  (1.549 m)   Wt 153 lb 8 oz (69.6 kg)   BMI 29.00 kg/m  GENERAL: well appearing,in no acute distress,alert SKIN:  Color, texture, turgor normal. No rashes or lesions HEAD:  Normocephalic/atraumatic. CV:  RRR RESP: Normal respiratory effort MSK: +tenderness over bilateral temples  NEUROLOGICAL: Mental Status: Alert, oriented to person, place and time,Follows commands Cranial Nerves: PERRL, visual fields intact to confrontation, extraocular movements intact, facial sensation intact, no facial droop or ptosis, hearing grossly intact, no dysarthria, palate elevate symmetrically, tongue  protrudes midline, shoulder shrug intact and symmetric Motor: muscle strength 5/5 both upper and lower extremities,no drift, normal tone Reflexes: 2+ throughout Sensation: intact to light touch all 4 extremities Coordination: Finger-to- nose-finger intact bilaterally Gait: normal-based   IMPRESSION: 72  year old female with a history of HTN, SVT, DM2, ITP who presents for evaluation of left sided headaches. Will order brain MRI to rule out structural causes of persistent unilateral headaches. ESR, CRP ordered to assess for GCA. She does not endorse migrainous features to her headaches at this time. She would prefer not to start a medication for headaches as she is already taking several medications. Supplement and lifestyle information for headache prevention provided.  PLAN: -MRI brain -ESR, CRP   I spent a total of 26 minutes chart reviewing and counseling the patient. Headache education was done. Discussed treatment options including , natural supplements. Discussed medication side effects, adverse reactions and drug interactions. Written educational materials and patient instructions outlining all of the above were given.  Follow-up: 6 months   Ocie Doyne, MD 08/29/2022   2:37 PM

## 2022-08-29 ENCOUNTER — Ambulatory Visit: Payer: Medicare Other | Admitting: Psychiatry

## 2022-08-29 ENCOUNTER — Encounter: Payer: Self-pay | Admitting: Psychiatry

## 2022-08-29 ENCOUNTER — Telehealth: Payer: Self-pay | Admitting: Psychiatry

## 2022-08-29 VITALS — Ht 61.0 in | Wt 153.5 lb

## 2022-08-29 DIAGNOSIS — G4452 New daily persistent headache (NDPH): Secondary | ICD-10-CM

## 2022-08-29 DIAGNOSIS — R519 Headache, unspecified: Secondary | ICD-10-CM

## 2022-08-29 NOTE — Telephone Encounter (Signed)
[employer] medicare NPR sent to GI [phone] 

## 2022-08-29 NOTE — Patient Instructions (Signed)
GENERAL HEADACHE INSTRUCTIONS  Natural supplements: Magnesium Oxide or Magnesium Glycinate 500 mg at bed (up to 800 mg daily) Coenzyme Q10 300 mg in AM Vitamin B2- 200 mg twice a day  Add 1 supplement at a time since even natural supplements can have undesirable side effects. You can sometimes buy supplements cheaper (especially Coenzyme Q10) at www.WebmailGuide.co.za or at [employer].  Vitamins and herbs that show potential:  Magnesium: Magnesium (250 mg twice a day or 500 mg at bed) has a relaxant effect on smooth muscles such as blood vessels. Individuals suffering from frequent or daily headache usually have low magnesium levels which can be increase with daily supplementation of 400-750 mg. Three trials found 40-90% average headache reduction  when used as a preventative. Magnesium also demonstrated the benefit in menstrually related migraine.  Magnesium is part of the messenger system in the serotonin cascade and it is a good muscle relaxant.  It is also useful for constipation which can be a side effect of other medications used to treat migraine. Good sources include nuts, whole grains, and tomatoes. Side Effects: loose stool/diarrhea Riboflavin (vitamin B 2) 200 mg twice a day. This vitamin assists nerve cells in the production of ATP a principal energy storing molecule.  It is necessary for many chemical reactions in the body.  There have been at least 3 clinical trials of riboflavin using 400 mg per day all of which suggested that migraine frequency can be decreased.  All 3 trials showed significant improvement in over half of migraine sufferers.  The supplement is found in bread, cereal, milk, meat, and poultry.  Most Americans get more riboflavin than the recommended daily allowance, however riboflavin deficiency is not necessary for the supplements to help prevent headache. Side effects: energizing, green urine  Coenzyme Q10: This is present in almost all cells in the body and is critical  component for the conversion of energy.  Recent studies have shown that a nutritional supplement of CoQ10 can reduce the frequency of migraine attacks by improving the energy production of cells as with riboflavin.  Doses of 150 mg twice a day have been shown to be effective.  Melatonin: Increasing evidence shows correlation between melatonin secretion and headache conditions.  Melatonin supplementation has decreased headache intensity and duration.  It is widely used as a sleep aid.  Sleep is natures way of dealing with migraine.  A dose of 3 mg is recommended to start for headaches including cluster headache. Higher doses up to 15 mg has been reviewed for use in Cluster headache and have been used. The rationale behind using melatonin for cluster is that many theories regarding the cause of Cluster headache center around the disruption of the normal circadian rhythm in the brain.  This helps restore the normal circadian rhythm.  Ginger: Ginger has a small amount of antihistamine and anti-inflammatory action which may help headache.  It is primarily used for nausea and may aid in the absorption of other medications. HEADACHE DIET: Foods and beverages which may trigger migraine Note that only 20% of headache patients are food sensitive. You will know if you are food sensitive if you get a headache consistently 20 minutes to 2 hours after eating a certain food. Only cut out a food if it causes headaches, otherwise you might remove foods you enjoy! What matters most for diet is to eat a well balanced healthy diet full of vegetables and low fat protein, and to not miss meals.  Chocolate, other sweets ALL  cheeses except cottage and cream cheese Dairy products, yogurt, sour cream, ice cream Liver Meat extracts (Bovril, Marmite, meat tenderizers) Meats or fish which have undergone aging, fermenting, pickling or smoking. These include: Hotdogs,salami,Lox,sausage, mortadellas,smoked salmon, pepperoni, Pickled  herring Pods of broad bean (English beans, Chinese pea pods, [place] (fava) beans, lima and navy beans Ripe avocado, ripe banana Yeast extracts or active yeast preparations such as Brewer's or Fleishman's (commercial bakes goods are permitted) Tomato based foods, pizza (lasagna, etc.)  MSG (monosodium glutamate) is disguised as many things; look for these common aliases: Monopotassium glutamate Autolysed yeast Hydrolysed protein Sodium caseinate "flavorings" "all natural preservatives" Nutrasweet  Avoid all other foods that convincingly provoke headaches.  Resources: The Dizzy [place] Laundry Your Headache Diet, migrainestrong.com  https://zamora-andrews.com/  Caffeine and Migraine For patients that have migraine, caffeine intake more than 3 days per week can lead to dependency and increased migraine frequency. I would recommend cutting back on your caffeine intake as best you can. The recommended amount of caffeine is 200-300 mg daily, although migraine patients may experience dependency at even lower doses. While you may notice an increase in headache temporarily, cutting back will be helpful for headaches in the long run. For more information on caffeine and migraine, visit: https://americanmigrainefoundation.org/resource-library/caffeine-and-migraine/  Headache Prevention Strategies:  1. Maintain a headache diary; learn to identify and avoid triggers.  - This can be a simple note where you log when you had a headache, associated symptoms, and medications used - There are several smartphone apps developed to help track migraines: Migraine Buddy, Migraine Monitor, Curelator N1-Headache App  Common triggers include: Emotional triggers: Emotional/Upset family or friends Emotional/Upset occupation Business reversal/success Anticipation anxiety Crisis-serious Post-crisis periodNew job/position   Physical triggers: Vacation  Day Weekend Strenuous Exercise High Altitude Location New Move Menstrual Day Physical Illness Oversleep/Not enough sleep Weather changes Light: Photophobia or light sesnitivity treatment involves a balance between desensitization and reduction in overly strong input. Use dark polarized glasses outside, but not inside. Avoid bright or fluorescent light, but do not dim environment to the point that going into a normally lit room hurts. Consider FL-41 tint lenses, which reduce the most irritating wavelengths without blocking too much light.  These can be obtained at axonoptics.com or theraspecs.com Foods: see list above.  2. Limit use of acute treatments (over-the-counter medications, triptans, etc.) to no more than 2 days per week or 10 days per month to prevent medication overuse headache (rebound headache).    3. Follow a regular schedule (including weekends and holidays): Don't skip meals. Eat a balanced diet. 8 hours of sleep nightly. Minimize stress. Exercise 30 minutes per day. Being overweight is associated with a 5 times increased risk of chronic migraine. Keep well hydrated and drink 6-8 glasses of water per day.  4. Initiate non-pharmacologic measures at the earliest onset of your headache. Rest and quiet environment. Relax and reduce stress. Breathe2Relax is a free app that can instruct you on    some simple relaxtion and breathing techniques. Http://Dawnbuse.com is a    free website that provides teaching videos on relaxation.  Also, there are  many apps that   can be downloaded for "mindful" relaxation.  An app called YOGA NIDRA will help walk you through mindfulness. Another app called Calm can be downloaded to give you a structured mindfulness guide with daily reminders and skill development. Headspace for guided meditation Mindfulness Based Stress Reduction Online Course: www.palousemindfulness.com Cold compresses.  Marland Kitchen

## 2022-08-30 LAB — SEDIMENTATION RATE: Sed Rate: 25 mm/h (ref 0–40)

## 2022-08-30 LAB — C-REACTIVE PROTEIN: CRP: <1 mg/L (ref 0–10)

## 2022-09-01 ENCOUNTER — Other Ambulatory Visit: Payer: Self-pay | Admitting: Family Medicine

## 2022-09-04 NOTE — Telephone Encounter (Signed)
Requested Prescriptions  Pending Prescriptions Disp Refills   irbesartan (AVAPRO) 75 MG tablet [Pharmacy Med Name: IRBESARTAN  75MG   TAB] 80 tablet 3    Sig: TAKE 1 TABLET BY MOUTH AT  BEDTIME     Cardiovascular:  Angiotensin Receptor Blockers Failed - 09/01/2022 10:48 PM      Failed - Last BP in normal range    BP Readings from Last 1 Encounters:  07/30/22 (!) 141/86         Passed - Cr in normal range and within 180 days    Creatinine, Ser  Date Value Ref Range Status  07/30/2022 0.73 0.44 - 1.00 mg/dL Final         Passed - K in normal range and within 180 days    Potassium  Date Value Ref Range Status  07/30/2022 3.7 3.5 - 5.1 mmol/L Final         Passed - Patient is not pregnant      Passed - Valid encounter within last 6 months    Recent Outpatient Visits           3 months ago Other seasonal allergic rhinitis   [facility] Malva Limes, MD   4 months ago Diabetes mellitus without complication [facility])   [facility] Malva Limes, MD   7 months ago Diabetes mellitus without complication [facility])   [place] [facility] Malva Limes, MD   9 months ago Sebaceous cyst   [facility] Malva Limes, MD   1 year ago Diabetes mellitus without complication [facility])   [facility] Caro Laroche, DO

## 2022-09-10 ENCOUNTER — Ambulatory Visit: Payer: Medicare Other | Attending: Orthopaedic Surgery

## 2022-09-10 DIAGNOSIS — M6281 Muscle weakness (generalized): Secondary | ICD-10-CM | POA: Insufficient documentation

## 2022-09-10 DIAGNOSIS — Z9889 Other specified postprocedural states: Secondary | ICD-10-CM | POA: Diagnosis not present

## 2022-09-10 DIAGNOSIS — R600 Localized edema: Secondary | ICD-10-CM | POA: Diagnosis not present

## 2022-09-10 DIAGNOSIS — M79642 Pain in left hand: Secondary | ICD-10-CM | POA: Diagnosis not present

## 2022-09-10 NOTE — Therapy (Addendum)
OUTPATIENT OCCUPATIONAL THERAPY ORTHO EVALUATION  Patient Name: Julia Garner MRN: [id number] DOB:1950/05/26, 72 y.o., female Today's Date: 09/13/2022  PCP: Dr. Malva Limes REFERRING PROVIDER: Dr. Berta Minor  END OF SESSION:  OT End of Session - 09/13/22 0902     Visit Number 1    Number of Visits 10    Date for OT Re-Evaluation 10/19/22    Progress Note Due on Visit 10    OT Start Time 1530    OT Stop Time 1635    OT Time Calculation (min) 65 min    Activity Tolerance Patient tolerated treatment well    Behavior During Therapy [facility] for tasks assessed/performed             Past Medical History:  Diagnosis Date   Allergy    Chronic ITP (idiopathic thrombocytopenia) (HCC)    Chronic urticaria 08/15/2018   COVID 2023   Depression    Head injuries    History of seizure 2007   one seizure    Hypertension    Past Surgical History:  Procedure Laterality Date   CATARACT EXTRACTION, BILATERAL  around 2021   Dr. Clydene Pugh   CESAREAN SECTION  1990   CHOLECYSTECTOMY  2005   COLONOSCOPY WITH PROPOFOL N/A 12/20/2020   Procedure: COLONOSCOPY WITH PROPOFOL;  Surgeon: Midge Minium, MD;  Location: [facility] ENDOSCOPY;  Service: Endoscopy;  Laterality: N/A;   CYST EXCISION  1996   Tracheal Cyst   Patient Active Problem List   Diagnosis Date Noted   Hypocalcemia 01/29/2022   Leukocytosis 07/25/2021   COVID 07/25/2021   Uncontrolled type 2 diabetes mellitus with hyperglycemia, without long-term current use of insulin (HCC) 07/24/2021   Hypokalemia 07/24/2021   Carpal tunnel syndrome 07/20/2021   Connective tissue disease (HCC) 07/20/2021   Hyperplastic colon polyp    SVT (supraventricular tachycardia) 06/08/2020   Bilateral carpal tunnel syndrome 07/31/2019   Aortic atherosclerosis (HCC) 11/04/2018   Fatty liver 11/04/2018   Chronic urticaria 08/15/2018   Chronic ITP (idiopathic thrombocytopenia) (HCC) 08/15/2018   Recurrent infections 08/15/2018   Diabetes  mellitus without complication (HCC) 09/15/2017   Other seasonal allergic rhinitis 06/29/2014   Insomnia 06/29/2014   Arthralgia of temporomandibular joint 06/29/2014   Edema 06/29/2014   Oral aphthae 07/08/2007   Cutaneous lipodystrophy 06/11/2007   Arthropathy of hand 01/25/2007   Fam hx-ischem heart disease 01/21/2007   Menopausal and postmenopausal disorder 10/08/2005   Thrombocytopenia (HCC) 10/08/2005   Obstructive sleep apnea of adult 10/24/2004   Essential (primary) hypertension 01/16/2004   Mood disorder (HCC) 01/15/2002    ONSET DATE: 07/05/22  REFERRING DIAG: S/p L carpal tunnel release  THERAPY DIAG:  Hand edema  Muscle weakness (generalized)  Pain in left hand  S/p bilateral carpal tunnel release  Rationale for Evaluation and Treatment: Rehabilitation  SUBJECTIVE:   SUBJECTIVE STATEMENT: Pt reports that she's been doing deep tissue massage daily to her L hand. Pt accompanied by: self  PERTINENT HISTORY: S/p carpal tunnel release on 07/05/22.  Pt remains with tenderness to the L palm and an area of swelling and likely scar tissue build up to the L lateral from incision site at base of thumb at the thenar eminence.  PRECAUTIONS: None  RED FLAGS: None   WEIGHT BEARING RESTRICTIONS: No  PAIN:  Are you having pain? Yes: NPRS scale: 0/10 at rest, 2/10 with activity /10 Pain location: L palm Pain description: tender, sometimes sharp Aggravating factors: holding pots/pans Relieving factors: advil on occasion; avoiding use of  the hand  LIVING ENVIRONMENT: Lives with: lives with their spouse  PLOF: Independent  PATIENT GOALS: "Get rid of the pain and the swelling."   NEXT MD VISIT: ~2 weeks; 09/25/22 to Dr. Mathis Bud  OBJECTIVE:   HAND DOMINANCE: Right   ADLs: Overall ADLs: indep but with some occasional pain when using L hand to help with lifting heavy pots/pans when cooking.  FUNCTIONAL OUTCOME MEASURES: FOTO: 65; predicted 73  UPPER  EXTREMITY ROM:  Bilat forearm/wrist/hand WNL   UPPER EXTREMITY MMT:   Bilat forearm/wrist WNL; see weakness in L hand noted below.  HAND FUNCTION: Grip strength: Right: 62 lbs; Left: 41 lbs, Lateral pinch: Right: 14 lbs, Left: 10 lbs, and 3 point pinch: Right: 12 lbs, Left: 9 lbs  COORDINATION: 9 Hole Peg test: Right: 19 sec; Left: 21 sec  SENSATION: Light touch: occasional tingling L hand digits  EDEMA: small pocket of swelling in L hand thenar eminence just laterally to the L of carpal tunnel release incision.  COGNITION: Overall cognitive status: Within functional limits for tasks assessed  OBSERVATIONS:  Pt pleasant, cooperate, eager to begin iontophoresis.    TODAY'S TREATMENT:                                                                                                                              DATE: 09/10/22 Evaluation completed.  Iontophoresis: tx time: 20 min L volar palm at the base of the L thenar eminence lateral to carpal tunnel incision, 1.5 cc dexamethasone, 40 mA/min, current amplitude 2.0 mA; intact skin following tx.  PATIENT EDUCATION: Education details: OT role, goals, poc, benefits of iontophoresis Person educated: Patient Education method: Explanation Education comprehension: verbalized understanding  HOME EXERCISE PROGRAM: Continue deep tissue massage; ice massage  GOALS: Goals reviewed with patient? Yes  SHORT TERM GOALS: Target date: 10/05/22  Pt will be indep to perform HEP for improving L hand strength for daily tasks. Baseline: Eval: HEP not yet initiated Goal status: INITIAL  LONG TERM GOALS: Target date: 10/19/2022  Pt will increase FOTO score to 36 or better to indicate improvement in self perceived functional use of the L hand with daily tasks. Baseline: Eval: 29 Goal status: INITIAL  2.  Pt will increase L grip strength by 10 or more lbs in order to improve tolerance to hold and support heavy pots and pans in the L  hand. Baseline: Eval: L 41 lbs (R 62 lbs) Goal status: INITIAL  3.  Pt will tolerate manual therapy, therapeutic modalities, and exercises to decrease pain and swelling in L hand to a reported 1/10 pain or less with activity.  Baseline: Eval: mild edema L thenar eminence, 2/10 pain with activity. Goal status: INITIAL  ASSESSMENT:  CLINICAL IMPRESSION: Patient is a 72 y.o. female who was seen today for occupational therapy evaluation for pain and edema in L hand related to carpal tunnel release on 07/05/22.  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  Incision site is healed well, though noted mild swelling, tenderness, and likely scar tissue built up L of the incision site.  Pt presents with pain and muscle weakness in L hand as compared to the R hand, and reports that lifting heavy pots and pans in the kitchen is challenging d/t pain and weakness in the L hand.  Pt will benefit from skilled OT to provide HEP for strengthening and iontophoresis to manage pain and swelling.  Planning 1.5 cc dexamethasone with iontophoresis x6-10 tx total.  Pt tolerated this tx well today with skin intact post tx.  Pt in agreement with above plan.   PERFORMANCE DEFICITS: in functional skills including IADLs, sensation, edema, strength, pain, fascial restrictions, skin integrity, and UE functional use.  IMPAIRMENTS: are limiting patient from IADLs.   COMORBIDITIES: may have co-morbidities  that affects occupational performance. Patient will benefit from skilled OT to address above impairments and improve overall function.  MODIFICATION OR ASSISTANCE TO COMPLETE EVALUATION: No modification of tasks or assist necessary to complete an evaluation.  OT OCCUPATIONAL PROFILE AND HISTORY: Problem focused assessment: Including review of records  relating to presenting problem.  CLINICAL DECISION MAKING: Moderate - several treatment options, min-mod task modification necessary  REHAB POTENTIAL: Good  EVALUATION COMPLEXITY: Moderate      PLAN:  OT FREQUENCY: 2x/week  OT DURATION: other: 5 weeks  PLANNED INTERVENTIONS: self care/ADL training, therapeutic exercise, therapeutic activity, manual therapy, scar mobilization, iontophoresis, moist heat, cryotherapy, contrast bath, patient/family education, and DME and/or AE instructions  RECOMMENDED OTHER SERVICES: none at this time.  CONSULTED AND AGREED WITH PLAN OF CARE: Patient  PLAN FOR NEXT SESSION: iontophoresis  Danelle Earthly, MS, OTR/L  Otis Dials, OT 09/13/2022, 9:04 AM

## 2022-09-12 ENCOUNTER — Ambulatory Visit: Payer: Medicare Other

## 2022-09-12 DIAGNOSIS — M6281 Muscle weakness (generalized): Secondary | ICD-10-CM | POA: Diagnosis not present

## 2022-09-12 DIAGNOSIS — Z9889 Other specified postprocedural states: Secondary | ICD-10-CM | POA: Diagnosis not present

## 2022-09-12 DIAGNOSIS — M79642 Pain in left hand: Secondary | ICD-10-CM

## 2022-09-12 DIAGNOSIS — R600 Localized edema: Secondary | ICD-10-CM | POA: Diagnosis not present

## 2022-09-14 NOTE — Therapy (Signed)
OUTPATIENT OCCUPATIONAL THERAPY ORTHO TREATMENT NOTE  Patient Name: Julia Garner MRN: [id number] DOB:1950/06/09, 72 y.o., female Today's Date: 09/14/2022  PCP: Dr. Malva Limes REFERRING PROVIDER: Dr. Berta Minor  END OF SESSION:  OT End of Session - 09/14/22 2109     Visit Number 2    Number of Visits 10    Date for OT Re-Evaluation 10/19/22    Progress Note Due on Visit 10    OT Start Time 1015    OT Stop Time 1043    OT Time Calculation (min) 28 min    Activity Tolerance Patient tolerated treatment well    Behavior During Therapy [facility] for tasks assessed/performed             Past Medical History:  Diagnosis Date   Allergy    Chronic ITP (idiopathic thrombocytopenia) (HCC)    Chronic urticaria 08/15/2018   COVID 2023   Depression    Head injuries    History of seizure 2007   one seizure    Hypertension    Past Surgical History:  Procedure Laterality Date   CATARACT EXTRACTION, BILATERAL  around 2021   Dr. Clydene Pugh   CESAREAN SECTION  1990   CHOLECYSTECTOMY  2005   COLONOSCOPY WITH PROPOFOL N/A 12/20/2020   Procedure: COLONOSCOPY WITH PROPOFOL;  Surgeon: Midge Minium, MD;  Location: [facility] ENDOSCOPY;  Service: Endoscopy;  Laterality: N/A;   CYST EXCISION  1996   Tracheal Cyst   Patient Active Problem List   Diagnosis Date Noted   Hypocalcemia 01/29/2022   Leukocytosis 07/25/2021   COVID 07/25/2021   Uncontrolled type 2 diabetes mellitus with hyperglycemia, without long-term current use of insulin (HCC) 07/24/2021   Hypokalemia 07/24/2021   Carpal tunnel syndrome 07/20/2021   Connective tissue disease (HCC) 07/20/2021   Hyperplastic colon polyp    SVT (supraventricular tachycardia) 06/08/2020   Bilateral carpal tunnel syndrome 07/31/2019   Aortic atherosclerosis (HCC) 11/04/2018   Fatty liver 11/04/2018   Chronic urticaria 08/15/2018   Chronic ITP (idiopathic thrombocytopenia) (HCC) 08/15/2018   Recurrent infections 08/15/2018    Diabetes mellitus without complication (HCC) 09/15/2017   Other seasonal allergic rhinitis 06/29/2014   Insomnia 06/29/2014   Arthralgia of temporomandibular joint 06/29/2014   Edema 06/29/2014   Oral aphthae 07/08/2007   Cutaneous lipodystrophy 06/11/2007   Arthropathy of hand 01/25/2007   Fam hx-ischem heart disease 01/21/2007   Menopausal and postmenopausal disorder 10/08/2005   Thrombocytopenia (HCC) 10/08/2005   Obstructive sleep apnea of adult 10/24/2004   Essential (primary) hypertension 01/16/2004   Mood disorder (HCC) 01/15/2002    ONSET DATE: 07/05/22  REFERRING DIAG: S/p L carpal tunnel release  THERAPY DIAG:  Muscle weakness (generalized)  Pain in left hand  S/p bilateral carpal tunnel release  Hand edema  Rationale for Evaluation and Treatment: Rehabilitation  SUBJECTIVE:   SUBJECTIVE STATEMENT: Pt reports that she thinks her palm is already looking better after her first iontophoresis tx. Pt accompanied by: self  PERTINENT HISTORY: S/p carpal tunnel release on 07/05/22.  Pt remains with tenderness to the L palm and an area of swelling and likely scar tissue build up to the L lateral from incision site at base of thumb at the thenar eminence.  PRECAUTIONS: None  RED FLAGS: None   WEIGHT BEARING RESTRICTIONS: No  PAIN:  Are you having pain? Yes: NPRS scale: 0/10 at rest, 2/10 with activity /10 Pain location: L palm Pain description: tender, sometimes sharp Aggravating factors: holding pots/pans Relieving factors: advil on occasion;  avoiding use of the hand  LIVING ENVIRONMENT: Lives with: lives with their spouse  PLOF: Independent  PATIENT GOALS: "Get rid of the pain and the swelling."   NEXT MD VISIT: ~2 weeks; 09/25/22 to Dr. Mathis Bud  OBJECTIVE:   HAND DOMINANCE: Right   ADLs: Overall ADLs: indep but with some occasional pain when using L hand to help with lifting heavy pots/pans when cooking.  FUNCTIONAL OUTCOME MEASURES: FOTO:  65; predicted 73  UPPER EXTREMITY ROM:  Bilat forearm/wrist/hand WNL   UPPER EXTREMITY MMT:   Bilat forearm/wrist WNL; see weakness in L hand noted below.  HAND FUNCTION: Grip strength: Right: 62 lbs; Left: 41 lbs, Lateral pinch: Right: 14 lbs, Left: 10 lbs, and 3 point pinch: Right: 12 lbs, Left: 9 lbs  COORDINATION: 9 Hole Peg test: Right: 19 sec; Left: 21 sec  SENSATION: Light touch: occasional tingling L hand digits  EDEMA: small pocket of swelling in L hand thenar eminence just laterally to the L of carpal tunnel release incision.  COGNITION: Overall cognitive status: Within functional limits for tasks assessed  OBSERVATIONS:  Pt pleasant, cooperate, eager to begin iontophoresis.    TODAY'S TREATMENT:                                                                                                                              DATE: 09/12/22 Iontophoresis: tx time: 22 min.  L volar palm at the base of the L thenar eminence lateral to carpal tunnel incision, 1.5 cc dexamethasone, 40 mA/min, current amplitude 2.0 mA, decreased to 1.8 to increase tolerance; intact skin following tx.  PATIENT EDUCATION: Education details: remove iontophoresis patch after 1-2 hours, wash hand, apply lotion as needed to tx area. Person educated: Patient Education method: Explanation Education comprehension: verbalized understanding  HOME EXERCISE PROGRAM: Continue deep tissue massage; ice massage  GOALS: Goals reviewed with patient? Yes  SHORT TERM GOALS: Target date: 10/05/22  Pt will be indep to perform HEP for improving L hand strength for daily tasks. Baseline: Eval: HEP not yet initiated Goal status: INITIAL  LONG TERM GOALS: Target date: 10/19/2022  Pt will increase FOTO score to 36 or better to indicate improvement in self perceived functional use of the L hand with daily tasks. Baseline: Eval: 29 Goal status: INITIAL  2.  Pt will increase L grip strength by 10 or more lbs in  order to improve tolerance to hold and support heavy pots and pans in the L hand. Baseline: Eval: L 41 lbs (R 62 lbs) Goal status: INITIAL  3.  Pt will tolerate manual therapy, therapeutic modalities, and exercises to decrease pain and swelling in L hand to a reported 1/10 pain or less with activity.  Baseline: Eval: mild edema L thenar eminence, 2/10 pain with activity. Goal status: INITIAL  ASSESSMENT:  CLINICAL IMPRESSION: Pt reports that she thinks her palm is already looking better after her first iontophoresis tx, and feels there is "less of a  bulge."  Tolerated 2nd iontophoresis tx this date, with decrease to 1.8 intensity to increase tolerance.  Skin check performed following tx and noted to be intact.  Secured patch again for pt to leave on for 1-2 hours.  Pt instructed to remove patch in 1-2 hours, wash hand, check skin, and apply lotion as needed.  Will continue with iontophoresis tx as tolerated for up to 10 txs and issue theraputty next session to begin L hand strengthening.  PERFORMANCE DEFICITS: in functional skills including IADLs, sensation, edema, strength, pain, fascial restrictions, skin integrity, and UE functional use.  IMPAIRMENTS: are limiting patient from IADLs.   COMORBIDITIES: may have co-morbidities  that affects occupational performance. Patient will benefit from skilled OT to address above impairments and improve overall function.  MODIFICATION OR ASSISTANCE TO COMPLETE EVALUATION: No modification of tasks or assist necessary to complete an evaluation.  OT OCCUPATIONAL PROFILE AND HISTORY: Problem focused assessment: Including review of records relating to presenting problem.  CLINICAL DECISION MAKING: Moderate - several treatment options, min-mod task modification necessary  REHAB POTENTIAL: Good  EVALUATION COMPLEXITY: Moderate      PLAN:  OT FREQUENCY: 2x/week  OT DURATION: other: 5 weeks  PLANNED INTERVENTIONS: self care/ADL training,  therapeutic exercise, therapeutic activity, manual therapy, scar mobilization, iontophoresis, moist heat, cryotherapy, contrast bath, patient/family education, and DME and/or AE instructions  RECOMMENDED OTHER SERVICES: none at this time.  CONSULTED AND AGREED WITH PLAN OF CARE: Patient  PLAN FOR NEXT SESSION: iontophoresis  Danelle Earthly, MS, OTR/L  Otis Dials, OT 09/14/2022, 9:10 PM

## 2022-09-15 ENCOUNTER — Other Ambulatory Visit: Payer: Self-pay | Admitting: Family Medicine

## 2022-09-18 ENCOUNTER — Ambulatory Visit: Payer: Medicare Other | Attending: Orthopaedic Surgery

## 2022-09-18 ENCOUNTER — Encounter: Payer: Self-pay | Admitting: Allergy & Immunology

## 2022-09-18 ENCOUNTER — Other Ambulatory Visit: Payer: Self-pay

## 2022-09-18 ENCOUNTER — Ambulatory Visit: Payer: Medicare Other | Admitting: Allergy & Immunology

## 2022-09-18 VITALS — BP 130/86 | HR 81 | Temp 98.4°F | Resp 16 | Ht 61.0 in | Wt 152.6 lb

## 2022-09-18 DIAGNOSIS — Z9889 Other specified postprocedural states: Secondary | ICD-10-CM | POA: Insufficient documentation

## 2022-09-18 DIAGNOSIS — J4520 Mild intermittent asthma, uncomplicated: Secondary | ICD-10-CM | POA: Diagnosis not present

## 2022-09-18 DIAGNOSIS — L508 Other urticaria: Secondary | ICD-10-CM

## 2022-09-18 DIAGNOSIS — M79642 Pain in left hand: Secondary | ICD-10-CM | POA: Diagnosis not present

## 2022-09-18 DIAGNOSIS — M6281 Muscle weakness (generalized): Secondary | ICD-10-CM | POA: Diagnosis not present

## 2022-09-18 DIAGNOSIS — R600 Localized edema: Secondary | ICD-10-CM | POA: Insufficient documentation

## 2022-09-18 DIAGNOSIS — B999 Unspecified infectious disease: Secondary | ICD-10-CM

## 2022-09-18 MED ORDER — PREDNISONE 10 MG PO TABS: ORAL_TABLET | ORAL | 0 refills | Status: DC

## 2022-09-18 NOTE — Telephone Encounter (Signed)
Requested Prescriptions  Pending Prescriptions Disp Refills   simvastatin (ZOCOR) 40 MG tablet [Pharmacy Med Name: Simvastatin 40 MG Oral Tablet] 100 tablet 0    Sig: TAKE 1 TABLET BY MOUTH AT  BEDTIME     Cardiovascular:  Antilipid - Statins Failed - 09/15/2022 10:42 PM      Failed - Lipid Panel in normal range within the last 12 months    Cholesterol, Total  Date Value Ref Range Status  04/17/2022 129 100 - 199 mg/dL Final   LDL Chol Calc (NIH)  Date Value Ref Range Status  04/17/2022 59 0 - 99 mg/dL Final   HDL  Date Value Ref Range Status  04/17/2022 33 (L) >39 mg/dL Final   Triglycerides  Date Value Ref Range Status  04/17/2022 227 (H) 0 - 149 mg/dL Final         Passed - Patient is not pregnant      Passed - Valid encounter within last 12 months    Recent Outpatient Visits           4 months ago Other seasonal allergic rhinitis   [place] [facility] Malva Limes, MD   5 months ago Diabetes mellitus without complication [facility])   [place] [facility] Malva Limes, MD   8 months ago Diabetes mellitus without complication [facility])   [place] [facility] Malva Limes, MD   10 months ago Sebaceous cyst   [place] [facility] Malva Limes, MD   1 year ago Diabetes mellitus without complication [facility])   [place] [facility] [place], Darl Householder, DO       Future Appointments             In 1 week Fisher, Demetrios Isaacs, MD [facility]

## 2022-09-18 NOTE — Therapy (Signed)
OUTPATIENT OCCUPATIONAL THERAPY ORTHO TREATMENT NOTE  Patient Name: Julia Garner MRN: [id number] DOB:[birth year], 72 y.o., female Today's Date: 09/18/2022  PCP: Dr. Malva Limes REFERRING PROVIDER: Dr. Berta Minor  END OF SESSION:  OT End of Session - 09/18/22 1131     Visit Number 3    Number of Visits 10    Date for OT Re-Evaluation 10/19/22    Progress Note Due on Visit 10    OT Start Time 1100    OT Stop Time 1128    OT Time Calculation (min) 28 min    Activity Tolerance Patient tolerated treatment well    Behavior During Therapy [facility] for tasks assessed/performed             Past Medical History:  Diagnosis Date   Allergy    Chronic ITP (idiopathic thrombocytopenia) (HCC)    Chronic urticaria 08/15/2018   COVID 2023   Depression    Head injuries    History of seizure 2007   one seizure    Hypertension    Past Surgical History:  Procedure Laterality Date   CATARACT EXTRACTION, BILATERAL  around 2021   Dr. Clydene Pugh   CESAREAN SECTION  1990   CHOLECYSTECTOMY  2005   COLONOSCOPY WITH PROPOFOL N/A 12/20/2020   Procedure: COLONOSCOPY WITH PROPOFOL;  Surgeon: Midge Minium, MD;  Location: [facility] ENDOSCOPY;  Service: Endoscopy;  Laterality: N/A;   CYST EXCISION  1996   Tracheal Cyst   Patient Active Problem List   Diagnosis Date Noted   Hypocalcemia 01/29/2022   Leukocytosis 07/25/2021   COVID 07/25/2021   Uncontrolled type 2 diabetes mellitus with hyperglycemia, without long-term current use of insulin (HCC) 07/24/2021   Hypokalemia 07/24/2021   Carpal tunnel syndrome 07/20/2021   Connective tissue disease (HCC) 07/20/2021   Hyperplastic colon polyp    SVT (supraventricular tachycardia) 06/08/2020   Bilateral carpal tunnel syndrome 07/31/2019   Aortic atherosclerosis (HCC) 11/04/2018   Fatty liver 11/04/2018   Chronic urticaria 08/15/2018   Chronic ITP (idiopathic thrombocytopenia) (HCC) 08/15/2018   Recurrent infections 08/15/2018   Diabetes  mellitus without complication (HCC) 09/15/2017   Other seasonal allergic rhinitis 06/29/2014   Insomnia 06/29/2014   Arthralgia of temporomandibular joint 06/29/2014   Edema 06/29/2014   Oral aphthae 07/08/2007   Cutaneous lipodystrophy 06/11/2007   Arthropathy of hand 01/25/2007   Fam hx-ischem heart disease 01/21/2007   Menopausal and postmenopausal disorder 10/08/2005   Thrombocytopenia (HCC) 10/08/2005   Obstructive sleep apnea of adult 10/24/2004   Essential (primary) hypertension 01/16/2004   Mood disorder (HCC) 01/15/2002   ONSET DATE: 07/05/22  REFERRING DIAG: S/p L carpal tunnel release  THERAPY DIAG:  Muscle weakness (generalized)  Pain in left hand  S/p bilateral carpal tunnel release  Hand edema  Rationale for Evaluation and Treatment: Rehabilitation  SUBJECTIVE:   SUBJECTIVE STATEMENT: Pt reports her L palm is only painful if she touches it.   Pt accompanied by: self  PERTINENT HISTORY: S/p carpal tunnel release on 07/05/22.  Pt remains with tenderness to the L palm and an area of swelling and likely scar tissue build up to the L lateral from incision site at base of thumb at the thenar eminence.  PRECAUTIONS: None  RED FLAGS: None   WEIGHT BEARING RESTRICTIONS: No  PAIN:  Are you having pain? Yes: NPRS scale: 0/10 at rest, with touch 2 /10 Pain location: L palm Pain description: tender, sometimes sharp Aggravating factors: holding pots/pans Relieving factors: advil on occasion; avoiding use of  the hand  LIVING ENVIRONMENT: Lives with: lives with their spouse  PLOF: Independent  PATIENT GOALS: "Get rid of the pain and the swelling."   NEXT MD VISIT: ~2 weeks; 09/25/22 to Dr. Mathis Bud  OBJECTIVE:   HAND DOMINANCE: Right   ADLs: Overall ADLs: indep but with some occasional pain when using L hand to help with lifting heavy pots/pans when cooking.  FUNCTIONAL OUTCOME MEASURES: FOTO: 65; predicted 73  UPPER EXTREMITY ROM:  Bilat  forearm/wrist/hand WNL   UPPER EXTREMITY MMT:   Bilat forearm/wrist WNL; see weakness in L hand noted below.  HAND FUNCTION: Grip strength: Right: 62 lbs; Left: 41 lbs, Lateral pinch: Right: 14 lbs, Left: 10 lbs, and 3 point pinch: Right: 12 lbs, Left: 9 lbs  COORDINATION: 9 Hole Peg test: Right: 19 sec; Left: 21 sec  SENSATION: Light touch: occasional tingling L hand digits  EDEMA: small pocket of swelling in L hand thenar eminence just laterally to the L of carpal tunnel release incision.  COGNITION: Overall cognitive status: Within functional limits for tasks assessed  OBSERVATIONS:  Pt pleasant, cooperate, eager to begin iontophoresis.    TODAY'S TREATMENT:                                                                                                                              DATE: 09/18/22 Iontophoresis: tx time: 20 min.  L volar palm at the base of the L thenar eminence lateral to carpal tunnel incision, 1.5 cc dexamethasone, 40 mA/min, current amplitude 2.0 mA.  Pt will leave patch on for 1-2 hours and remove at that time.  Pt instructed to perform skin check when patch is removed and notify OT if any skin irritation is evident.    Therapeutic Exercise: Issued firm dark blue putty and instructed pt in gross gripping, digit opposition, lateral and 3 point pinching, and digit abd/add exercises for strengthening L hand.  Also encouraged pt to roll putty under L palm for a soft tissue massage to target pain and edema in L palm.  Pt able to return demo on R hand for above noted exercises as L hand had patch still applied.  Encouraged pt to use putty daily, but limit gripping to low reps to avoid pain from overuse.  Pt verbalized understanding.  PATIENT EDUCATION: Education details: theraputty exercises Person educated: Patient Education method: Explanation, demo, written handout Education comprehension: verbalized understanding, demonstrated understanding with written  handout  HOME EXERCISE PROGRAM: Continue deep tissue massage; ice massage, firm blue theraputty  GOALS: Goals reviewed with patient? Yes  SHORT TERM GOALS: Target date: 10/05/22  Pt will be indep to perform HEP for improving L hand strength for daily tasks. Baseline: Eval: HEP not yet initiated Goal status: INITIAL  LONG TERM GOALS: Target date: 10/19/2022  Pt will increase FOTO score to 36 or better to indicate improvement in self perceived functional use of the L hand with daily tasks. Baseline: Eval: 29 Goal status:  INITIAL  2.  Pt will increase L grip strength by 10 or more lbs in order to improve tolerance to hold and support heavy pots and pans in the L hand. Baseline: Eval: L 41 lbs (R 62 lbs) Goal status: INITIAL  3.  Pt will tolerate manual therapy, therapeutic modalities, and exercises to decrease pain and swelling in L hand to a reported 1/10 pain or less with activity.  Baseline: Eval: mild edema L thenar eminence, 2/10 pain with activity. Goal status: INITIAL  ASSESSMENT:  CLINICAL IMPRESSION: Pt reports her L palm is only painful if she touches it, and she felt the swelling at the L palm seemed a little more pronounced today.  Still 0/10 pain at rest and 2/10 pain with touch.  Pt able to tolerate 2.0 intensity this date with iontophoresis to L palm.  Pt will remove patch after 1-2 hours and perform her own skin check at home.  Pt aware to notify OT with any skin irritation.  Issued firm blue theraputty and instructed in L hand strengthening exercises this date.  Pt demos good ability to follow written handout.  Will continue with iontophoresis tx as tolerated for up to 10 txs and issue theraputty next session to begin L hand strengthening.  PERFORMANCE DEFICITS: in functional skills including IADLs, sensation, edema, strength, pain, fascial restrictions, skin integrity, and UE functional use.  IMPAIRMENTS: are limiting patient from IADLs.   COMORBIDITIES: may have  co-morbidities  that affects occupational performance. Patient will benefit from skilled OT to address above impairments and improve overall function.  MODIFICATION OR ASSISTANCE TO COMPLETE EVALUATION: No modification of tasks or assist necessary to complete an evaluation.  OT OCCUPATIONAL PROFILE AND HISTORY: Problem focused assessment: Including review of records relating to presenting problem.  CLINICAL DECISION MAKING: Moderate - several treatment options, min-mod task modification necessary  REHAB POTENTIAL: Good  EVALUATION COMPLEXITY: Moderate      PLAN:  OT FREQUENCY: 2x/week  OT DURATION: other: 5 weeks  PLANNED INTERVENTIONS: self care/ADL training, therapeutic exercise, therapeutic activity, manual therapy, scar mobilization, iontophoresis, moist heat, cryotherapy, contrast bath, patient/family education, and DME and/or AE instructions  RECOMMENDED OTHER SERVICES: none at this time.  CONSULTED AND AGREED WITH PLAN OF CARE: Patient  PLAN FOR NEXT SESSION: iontophoresis  Danelle Earthly, MS, OTR/L  [facility] Dials, OT 09/18/2022, 11:32 AM

## 2022-09-18 NOTE — Patient Instructions (Addendum)
1. Hives - Continue with Xolair every two weeks.  - Call us with any problems.  - I sent in a prednisone taper for you to take with you. - I will talk to Tammy and make sure that we get a 2 month supply of the Xolair.   2. Intermittent asthma, uncomplicated  - Continue with albuterol as needed.  - Lung testing looked good today. - [facility]   2. Return in about 6 months (around 03/18/2023).    Please inform us of any Emergency Department visits, hospitalizations, or changes in symptoms. Call us before going to the ED for breathing or allergy symptoms since we might be able to fit you in for a sick visit. Feel free to contact us anytime with any questions, problems, or concerns.  It was a pleasure to see you again today!  Websites that have reliable patient information: 1. American Academy of Asthma, Allergy, and Immunology: www.aaaai.org 2. Food Allergy Research and Education (FARE): foodallergy.org 3. Mothers of Asthmatics: http://www.asthmacommunitynetwork.org 4. American College of Allergy, Asthma, and Immunology: www.acaai.org   COVID-19 Vaccine Information can be found at: [email or web address].[email or web address] For questions related to vaccine distribution or appointments, please email [email or web address] or call [phone].     "Like" [place] on Facebook and Instagram for our latest updates!        Make sure you are registered to vote! If you have moved or changed any of your contact information, you will need to get this updated before voting!  In some cases, you MAY be able to register to vote online: [email or web address]

## 2022-09-18 NOTE — Progress Notes (Signed)
FOLLOW UP  Date of Service/Encounter:  09/18/22   Assessment:   Chronic urticaria - controlled with Xolair monthly, but with some breakthrough episodes   New onset rash - ? contact dermatitis   Recurrent infections - mostly sinopulmonary in nature (increased from 11 out of 23 serotypes protection to 17 out of 23 serotypes protection)   Chronic ITP - with round of rituximab years ago (weekly x 4 doses), now controlled with intermittent prednisone bursts   Intermittent asthma, uncomplicated - controlled with PRN albuterol   Plan/Recommendations:   1. Hives - Continue with Xolair every two weeks.  - Call us with any problems.  - I sent in a prednisone taper for you to take with you. - I will talk to Tammy and make sure that we get a 2 month supply of the Xolair.   2. Intermittent asthma, uncomplicated  - Continue with albuterol as needed.  - Lung testing looked good today. - [facility]   2. Return in about 6 months (around 03/18/2023).    Subjective:   Julia Garner is a 72 y.o. female presenting today for follow up of  Chief Complaint  Patient presents with   Asthma    no issues    Other    Needs a 3 month supply of xolair sent in plans to be out of the country     Julia Garner has a history of the following: Patient Active Problem List   Diagnosis Date Noted   Hypocalcemia 01/29/2022   Leukocytosis 07/25/2021   COVID 07/25/2021   Uncontrolled type 2 diabetes mellitus with hyperglycemia, without long-term current use of insulin (HCC) 07/24/2021   Hypokalemia 07/24/2021   Carpal tunnel syndrome 07/20/2021   Connective tissue disease (HCC) 07/20/2021   Hyperplastic colon polyp    SVT (supraventricular tachycardia) 06/08/2020   Bilateral carpal tunnel syndrome 07/31/2019   Aortic atherosclerosis (HCC) 11/04/2018   Fatty liver 11/04/2018   Chronic urticaria 08/15/2018   Chronic ITP (idiopathic thrombocytopenia) (HCC) 08/15/2018    Recurrent infections 08/15/2018   Diabetes mellitus without complication (HCC) 09/15/2017   Other seasonal allergic rhinitis 06/29/2014   Insomnia 06/29/2014   Arthralgia of temporomandibular joint 06/29/2014   Edema 06/29/2014   Oral aphthae 07/08/2007   Cutaneous lipodystrophy 06/11/2007   Arthropathy of hand 01/25/2007   Fam hx-ischem heart disease 01/21/2007   Menopausal and postmenopausal disorder 10/08/2005   Thrombocytopenia (HCC) 10/08/2005   Obstructive sleep apnea of adult 10/24/2004   Essential (primary) hypertension 01/16/2004   Mood disorder (HCC) 01/15/2002    History obtained from: chart review and patient.  Julia Garner is a 72 y.o. female presenting for a follow up visit.  She was last seen in June 2024.  At that time, we tried spacing out the Xolair to every month.  For her recurrent infections, she was doing great status post the Pneumovax.  We do not feel like further workup was needed.  For her intermittent asthma, she is doing well with albuterol as needed.  We never had her on a controller medication.  Since last visit, she has done well. She is going to [place] with her husband. She is leaving on October 7th. They are going to be gone for two months and she needs enough Xolair to be able to administer it to herself.  But she and her husband are from [place], but they are also going to be traveling to [place] as well as [place].  Asthma/Respiratory Symptom History: Asthma  has been well controlled at this point.  She has not been using her albuterol much at all.  She does still have an up-to-date 1 in case that becomes a problem.  She has not been on prednisone.  She denies any nighttime coughing or wheezing.  Overall, she is doing very well.  She has not had any serious bacterial illnesses.  Overall, her infectious history has been unremarkable since we saw her last time.  Skin Symptom History: Hives are under food control. She is doing fairly well with this and  she has some creams to use to keep everything under control. She did try spacing out and it did not work.  She is fine with doing it every 2 weeks.  She is wondering about long-term effects of the left and how long she can stay on this.  She has not yet heard that she get all of the other providers total, but I told her thus not necessarily the case at all.  We discussed the fact that people have been on Xolair consistently even during the initial studies in the late 1990s and they are doing just fine.  We discussed that there have been no red flags from long-term Xolair use.  Otherwise, there have been no changes to her past medical history, surgical history, family history, or social history.    Review of systems otherwise negative other than that mentioned in the HPI.    Objective:   Blood pressure 130/86, pulse 81, temperature 98.4 F (36.9 C), resp. rate 16, height 5\' 1"  (1.549 m), weight 152 lb 9.6 oz (69.2 kg), SpO2 97%. Body mass index is 28.83 kg/m.    Physical Exam Vitals reviewed.  Constitutional:      Appearance: She is well-developed.     Comments: Very lovely person. Talkative.   HENT:     Head: Normocephalic and atraumatic.     Right Ear: Tympanic membrane, ear canal and external ear normal.     Left Ear: Tympanic membrane, ear canal and external ear normal.     Nose: No nasal deformity, septal deviation, mucosal edema or rhinorrhea.     Right Turbinates: Enlarged, swollen and pale.     Left Turbinates: Enlarged, swollen and pale.     Right Sinus: No maxillary sinus tenderness or frontal sinus tenderness.     Left Sinus: No maxillary sinus tenderness or frontal sinus tenderness.     Mouth/Throat:     Mouth: Mucous membranes are not pale and not dry.     Pharynx: Uvula midline.  Eyes:     General: Lids are normal. No allergic shiner.       Right eye: No discharge.        Left eye: No discharge.     Conjunctiva/sclera: Conjunctivae normal.     Right eye: Right  conjunctiva is not injected. No chemosis.    Left eye: Left conjunctiva is not injected. No chemosis.    Pupils: Pupils are equal, round, and reactive to light.  Cardiovascular:     Rate and Rhythm: Normal rate and regular rhythm.     Heart sounds: Normal heart sounds.  Pulmonary:     Effort: Pulmonary effort is normal. No tachypnea, accessory muscle usage or respiratory distress.     Breath sounds: Normal breath sounds. No wheezing, rhonchi or rales.     Comments: Moving air well in all lung fields. No increased work of breathing noted.  Chest:     Chest wall: No tenderness.  Lymphadenopathy:     Cervical: No cervical adenopathy.  Skin:    General: Skin is warm.     Capillary Refill: Capillary refill takes less than 2 seconds.     Coloration: Skin is not pale.     Findings: No abrasion, erythema, petechiae or rash. Rash is not papular, urticarial or vesicular.     Comments: No urticarial lesions.  Rash from last visit has disappeared.  Neurological:     Mental Status: She is alert.  Psychiatric:        Behavior: Behavior is cooperative.      Diagnostic studies:    Spirometry: results normal (FEV1: 1.71/92%, FVC: 2.00/85%, FEV1/FVC: 86%).    Spirometry consistent with normal pattern.   Allergy Studies: none        Malachi Bonds, MD  [facility]

## 2022-09-19 ENCOUNTER — Encounter: Payer: Self-pay | Admitting: Allergy & Immunology

## 2022-09-19 ENCOUNTER — Encounter: Payer: Self-pay | Admitting: Psychiatry

## 2022-09-20 ENCOUNTER — Telehealth: Payer: Self-pay | Admitting: *Deleted

## 2022-09-20 ENCOUNTER — Ambulatory Visit: Payer: Medicare Other

## 2022-09-20 ENCOUNTER — Ambulatory Visit
Admission: RE | Admit: 2022-09-20 | Discharge: 2022-09-20 | Disposition: A | Discharge status: Home / Self Care | Payer: Medicare Other | Source: Ambulatory Visit | Attending: Psychiatry | Admitting: Psychiatry

## 2022-09-20 DIAGNOSIS — G4452 New daily persistent headache (NDPH): Secondary | ICD-10-CM

## 2022-09-20 DIAGNOSIS — Z9889 Other specified postprocedural states: Secondary | ICD-10-CM | POA: Diagnosis not present

## 2022-09-20 DIAGNOSIS — M6281 Muscle weakness (generalized): Secondary | ICD-10-CM

## 2022-09-20 DIAGNOSIS — M79642 Pain in left hand: Secondary | ICD-10-CM

## 2022-09-20 DIAGNOSIS — R600 Localized edema: Secondary | ICD-10-CM

## 2022-09-20 MED ORDER — OMALIZUMAB 150 MG/ML ~~LOC~~ SOSY: 300.0000 mg | PREFILLED_SYRINGE | SUBCUTANEOUS | 3 refills | Status: DC

## 2022-09-20 MED ORDER — GADOPICLENOL 0.5 MMOL/ML IV SOLN
7.5000 mL | Freq: Once | INTRAVENOUS | Status: AC | PRN
  Administered 2022-09-20: 7.5 mL via INTRAVENOUS

## 2022-09-20 NOTE — Telephone Encounter (Signed)
-----   Message from Alfonse Spruce sent at 09/19/2022  8:50 AM EDT ----- She needs a two month supply of Xolair since she is traveling aborad.

## 2022-09-20 NOTE — Therapy (Signed)
OUTPATIENT OCCUPATIONAL THERAPY ORTHO TREATMENT NOTE  Patient Name: Julia Garner MRN: [id number] DOB:[birth year], 72 y.o., female Today's Date: 09/20/2022  PCP: Dr. Malva Limes REFERRING PROVIDER: Dr. Berta Minor  END OF SESSION:  OT End of Session - 09/20/22 1038     Visit Number 4    Number of Visits 10    Date for OT Re-Evaluation 10/19/22    Progress Note Due on Visit 10    OT Start Time 1017    OT Stop Time 1050    OT Time Calculation (min) 33 min    Activity Tolerance Patient tolerated treatment well    Behavior During Therapy [facility] for tasks assessed/performed            Past Medical History:  Diagnosis Date   Allergy    Chronic ITP (idiopathic thrombocytopenia) (HCC)    Chronic urticaria 08/15/2018   COVID 2023   Depression    Head injuries    History of seizure 2007   one seizure    Hypertension    Past Surgical History:  Procedure Laterality Date   CATARACT EXTRACTION, BILATERAL  around 2021   Dr. Clydene Pugh   CESAREAN SECTION  1990   CHOLECYSTECTOMY  2005   COLONOSCOPY WITH PROPOFOL N/A 12/20/2020   Procedure: COLONOSCOPY WITH PROPOFOL;  Surgeon: Midge Minium, MD;  Location: [facility] ENDOSCOPY;  Service: Endoscopy;  Laterality: N/A;   CYST EXCISION  1996   Tracheal Cyst   Patient Active Problem List   Diagnosis Date Noted   Hypocalcemia 01/29/2022   Leukocytosis 07/25/2021   COVID 07/25/2021   Uncontrolled type 2 diabetes mellitus with hyperglycemia, without long-term current use of insulin (HCC) 07/24/2021   Hypokalemia 07/24/2021   Carpal tunnel syndrome 07/20/2021   Connective tissue disease (HCC) 07/20/2021   Hyperplastic colon polyp    SVT (supraventricular tachycardia) 06/08/2020   Bilateral carpal tunnel syndrome 07/31/2019   Aortic atherosclerosis (HCC) 11/04/2018   Fatty liver 11/04/2018   Chronic urticaria 08/15/2018   Chronic ITP (idiopathic thrombocytopenia) (HCC) 08/15/2018   Recurrent infections 08/15/2018   Diabetes  mellitus without complication (HCC) 09/15/2017   Other seasonal allergic rhinitis 06/29/2014   Insomnia 06/29/2014   Arthralgia of temporomandibular joint 06/29/2014   Edema 06/29/2014   Oral aphthae 07/08/2007   Cutaneous lipodystrophy 06/11/2007   Arthropathy of hand 01/25/2007   Fam hx-ischem heart disease 01/21/2007   Menopausal and postmenopausal disorder 10/08/2005   Thrombocytopenia (HCC) 10/08/2005   Obstructive sleep apnea of adult 10/24/2004   Essential (primary) hypertension 01/16/2004   Mood disorder (HCC) 01/15/2002   ONSET DATE: 07/05/22  REFERRING DIAG: S/p L carpal tunnel release  THERAPY DIAG:  Muscle weakness (generalized)  Pain in left hand  S/p bilateral carpal tunnel release  Hand edema  Rationale for Evaluation and Treatment: Rehabilitation  SUBJECTIVE:  SUBJECTIVE STATEMENT: Pt reports her palm feels less tender and less swollen than it has been.  Pt reports she's been using her theraputty regularly. Pt accompanied by: self  PERTINENT HISTORY: S/p carpal tunnel release on 07/05/22.  Pt remains with tenderness to the L palm and an area of swelling and likely scar tissue build up to the L lateral from incision site at base of thumb at the thenar eminence.  PRECAUTIONS: None  RED FLAGS: None   WEIGHT BEARING RESTRICTIONS: No  PAIN:  Are you having pain? Yes: NPRS scale: 0/10 at rest, with touch 1-2 /10 Pain location: L palm Pain description: tender, sometimes sharp Aggravating factors: holding pots/pans Relieving  factors: advil on occasion; avoiding use of the hand  LIVING ENVIRONMENT: Lives with: lives with their spouse  PLOF: Independent  PATIENT GOALS: "Get rid of the pain and the swelling."   NEXT MD VISIT: ~2 weeks; 09/25/22 to Dr. Mathis Bud  OBJECTIVE:   HAND DOMINANCE: Right   ADLs: Overall ADLs: indep but with some occasional pain when using L hand to help with lifting heavy pots/pans when cooking.  FUNCTIONAL OUTCOME  MEASURES: FOTO: 65; predicted 73  UPPER EXTREMITY ROM:  Bilat forearm/wrist/hand WNL   UPPER EXTREMITY MMT:   Bilat forearm/wrist WNL; see weakness in L hand noted below.  HAND FUNCTION: Grip strength: Right: 62 lbs; Left: 41 lbs, Lateral pinch: Right: 14 lbs, Left: 10 lbs, and 3 point pinch: Right: 12 lbs, Left: 9 lbs  COORDINATION: 9 Hole Peg test: Right: 19 sec; Left: 21 sec  SENSATION: Light touch: occasional tingling L hand digits  EDEMA: small pocket of swelling in L hand thenar eminence just laterally to the L of carpal tunnel release incision.  COGNITION: Overall cognitive status: Within functional limits for tasks assessed  OBSERVATIONS:  Pt pleasant, cooperate, eager to begin iontophoresis.    TODAY'S TREATMENT:                                                                                                                              DATE: 09/20/22 Iontophoresis: tx time: 20 min.  L volar palm at the base of the L thenar eminence lateral to carpal tunnel incision, 1.5 cc dexamethasone, 40 mA/min, current amplitude 2.0 mA.    Iontophoresis patch removed to complete skin check (intact) and OT applied strip of kinesiotape from volar palm to mid volar forearm. Tape applied with 50% stretch to promote fluid mobilization from palm to proximal arm.    PATIENT EDUCATION: Education details: Benefits of Kinesiotape for fluid mobilization; technique for tape application Person educated: Patient Education method: Programmer, multimedia, demo Education comprehension: verbalized understanding HOME EXERCISE PROGRAM: Continue deep tissue massage; ice massage, firm blue theraputty, kinesiotape for fluid mobilization at the L volar palm to mid volar forearm  GOALS: Goals reviewed with patient? Yes  SHORT TERM GOALS: Target date: 10/05/22  Pt will be indep to perform HEP for improving L hand strength for daily tasks. Baseline: Eval: HEP not yet initiated Goal status: INITIAL  LONG TERM  GOALS: Target date: 10/19/2022  Pt will increase FOTO score to 36 or better to indicate improvement in self perceived functional use of the L hand with daily tasks. Baseline: Eval: 29 Goal status: INITIAL  2.  Pt will increase L grip strength by 10 or more lbs in order to improve tolerance to hold and support heavy pots and pans in the L hand. Baseline: Eval: L 41 lbs (R 62 lbs) Goal status: INITIAL  3.  Pt will tolerate manual therapy, therapeutic modalities, and exercises to decrease pain and swelling in L hand to a reported 1/10 pain or less  with activity.  Baseline: Eval: mild edema L thenar eminence, 2/10 pain with activity. Goal status: INITIAL  ASSESSMENT:  CLINICAL IMPRESSION: Pt reports her palm feels less tender and less swollen than it has been.  Pt reports she's been using her theraputty regularly.  Pt tolerated iontophoresis well this date at 2.0 mA current amplitude; skin intact following tx.  Applied strip of kinesiotape from volar palm to mid volar forearm. Tape applied with 50% stretch to promote fluid mobilization from palm to proximal arm.  Pt verbalized understanding of technique for tape application and was encouraged to reapply tape (3 additional strips provided to utilize over the weekend) when tape begins to lose its stick.  Will continue with iontophoresis tx as tolerated for up to 10 txs and will assess next visit if pt found any additional benefit from the Kinesiotape that was applied today.  PERFORMANCE DEFICITS: in functional skills including IADLs, sensation, edema, strength, pain, fascial restrictions, skin integrity, and UE functional use.  IMPAIRMENTS: are limiting patient from IADLs.   COMORBIDITIES: may have co-morbidities  that affects occupational performance. Patient will benefit from skilled OT to address above impairments and improve overall function.  MODIFICATION OR ASSISTANCE TO COMPLETE EVALUATION: No modification of tasks or assist necessary to  complete an evaluation.  OT OCCUPATIONAL PROFILE AND HISTORY: Problem focused assessment: Including review of records relating to presenting problem.  CLINICAL DECISION MAKING: Moderate - several treatment options, min-mod task modification necessary  REHAB POTENTIAL: Good  EVALUATION COMPLEXITY: Moderate      PLAN:  OT FREQUENCY: 2x/week  OT DURATION: other: 5 weeks  PLANNED INTERVENTIONS: self care/ADL training, therapeutic exercise, therapeutic activity, manual therapy, scar mobilization, iontophoresis, moist heat, cryotherapy, contrast bath, patient/family education, and DME and/or AE instructions  RECOMMENDED OTHER SERVICES: none at this time.  CONSULTED AND AGREED WITH PLAN OF CARE: Patient  PLAN FOR NEXT SESSION: iontophoresis  Danelle Earthly, MS, OTR/L  Otis Dials, OT 09/20/2022, 10:56 AM

## 2022-09-20 NOTE — Telephone Encounter (Signed)
Called patient and advised renewal for 90 day to [employer]

## 2022-09-24 ENCOUNTER — Ambulatory Visit: Payer: Medicare Other

## 2022-09-24 DIAGNOSIS — R600 Localized edema: Secondary | ICD-10-CM | POA: Diagnosis not present

## 2022-09-24 DIAGNOSIS — Z9889 Other specified postprocedural states: Secondary | ICD-10-CM | POA: Diagnosis not present

## 2022-09-24 DIAGNOSIS — M79642 Pain in left hand: Secondary | ICD-10-CM | POA: Diagnosis not present

## 2022-09-24 DIAGNOSIS — M6281 Muscle weakness (generalized): Secondary | ICD-10-CM

## 2022-09-24 NOTE — Therapy (Signed)
OUTPATIENT OCCUPATIONAL THERAPY ORTHO TREATMENT NOTE  Patient Name: Julia Garner MRN: [id number] DOB:1950/05/09, 72 y.o., female Today's Date: 09/24/2022  PCP: Dr. Malva Limes REFERRING PROVIDER: Dr. Berta Minor  END OF SESSION:  OT End of Session - 09/24/22 1339     Visit Number 5    Number of Visits 10    Date for OT Re-Evaluation 10/19/22    Progress Note Due on Visit 10    OT Start Time 1315    OT Stop Time 1340    OT Time Calculation (min) 25 min    Activity Tolerance Patient tolerated treatment well    Behavior During Therapy [facility] for tasks assessed/performed            Past Medical History:  Diagnosis Date   Allergy    Chronic ITP (idiopathic thrombocytopenia) (HCC)    Chronic urticaria 08/15/2018   COVID 2023   Depression    Head injuries    History of seizure 2007   one seizure    Hypertension    Past Surgical History:  Procedure Laterality Date   CATARACT EXTRACTION, BILATERAL  around 2021   Dr. Clydene Pugh   CESAREAN SECTION  1990   CHOLECYSTECTOMY  2005   COLONOSCOPY WITH PROPOFOL N/A 12/20/2020   Procedure: COLONOSCOPY WITH PROPOFOL;  Surgeon: Midge Minium, MD;  [facility] ENDOSCOPY;  Service: Endoscopy;  Laterality: N/A;   CYST EXCISION  1996   Tracheal Cyst   Patient Active Problem List   Diagnosis Date Noted   Hypocalcemia 01/29/2022   Leukocytosis 07/25/2021   COVID 07/25/2021   Uncontrolled type 2 diabetes mellitus with hyperglycemia, without long-term current use of insulin (HCC) 07/24/2021   Hypokalemia 07/24/2021   Carpal tunnel syndrome 07/20/2021   Connective tissue disease (HCC) 07/20/2021   Hyperplastic colon polyp    SVT (supraventricular tachycardia) 06/08/2020   Bilateral carpal tunnel syndrome 07/31/2019   Aortic atherosclerosis (HCC) 11/04/2018   Fatty liver 11/04/2018   Chronic urticaria 08/15/2018   Chronic ITP (idiopathic thrombocytopenia) (HCC) 08/15/2018   Recurrent infections 08/15/2018   Diabetes  mellitus without complication (HCC) 09/15/2017   Other seasonal allergic rhinitis 06/29/2014   Insomnia 06/29/2014   Arthralgia of temporomandibular joint 06/29/2014   Edema 06/29/2014   Oral aphthae 07/08/2007   Cutaneous lipodystrophy 06/11/2007   Arthropathy of hand 01/25/2007   Fam hx-ischem heart disease 01/21/2007   Menopausal and postmenopausal disorder 10/08/2005   Thrombocytopenia (HCC) 10/08/2005   Obstructive sleep apnea of adult 10/24/2004   Essential (primary) hypertension 01/16/2004   Mood disorder (HCC) 01/15/2002   ONSET DATE: 07/05/22  REFERRING DIAG: S/p L carpal tunnel release  THERAPY DIAG:  Muscle weakness (generalized)  Pain in left hand  S/p bilateral carpal tunnel release  Hand edema  Rationale for Evaluation and Treatment: Rehabilitation  SUBJECTIVE:  SUBJECTIVE STATEMENT: Pt reports her palm feels less tender and less swollen than it has been.  Pt reports she's been using her theraputty regularly. Pt accompanied by: self  PERTINENT HISTORY: S/p carpal tunnel release on 07/05/22.  Pt remains with tenderness to the L palm and an area of swelling and likely scar tissue build up to the L lateral from incision site at base of thumb at the thenar eminence.  PRECAUTIONS: None  RED FLAGS: None   WEIGHT BEARING RESTRICTIONS: No  PAIN:  Are you having pain? Yes: NPRS scale: 0/10 at rest, with touch 1-2 /10 Pain location: L palm Pain description: tender, sometimes sharp Aggravating factors: holding pots/pans Relieving  factors: advil on occasion; avoiding use of the hand  LIVING ENVIRONMENT: Lives with: lives with their spouse  PLOF: Independent  PATIENT GOALS: "Get rid of the pain and the swelling."   NEXT MD VISIT: ~2 weeks; 09/25/22 to Dr. Mathis Bud  OBJECTIVE:   HAND DOMINANCE: Right   ADLs: Overall ADLs: indep but with some occasional pain when using L hand to help with lifting heavy pots/pans when cooking.  FUNCTIONAL OUTCOME  MEASURES: FOTO: 65; predicted 73  UPPER EXTREMITY ROM:  Bilat forearm/wrist/hand WNL   UPPER EXTREMITY MMT:   Bilat forearm/wrist WNL; see weakness in L hand noted below.  HAND FUNCTION: Grip strength: Right: 62 lbs; Left: 41 lbs, Lateral pinch: Right: 14 lbs, Left: 10 lbs, and 3 point pinch: Right: 12 lbs, Left: 9 lbs  COORDINATION: 9 Hole Peg test: Right: 19 sec; Left: 21 sec  SENSATION: Light touch: occasional tingling L hand digits  EDEMA: small pocket of swelling in L hand thenar eminence just laterally to the L of carpal tunnel release incision.  COGNITION: Overall cognitive status: Within functional limits for tasks assessed  OBSERVATIONS:  Pt pleasant, cooperate, eager to begin iontophoresis.    TODAY'S TREATMENT:                                                                                                                              DATE: 09/24/22 Iontophoresis: tx time: 20 min.  L volar palm at the base of the L thenar eminence lateral to carpal tunnel incision, 1.5 cc dexamethasone, 40 mA/min, current amplitude 2.0 mA.    Iontophoresis patch left on for pt to remove at home in 1-2 hours.  Pt acknowledged to perform skin check following removal of patch and relay any concerns as needed.  PATIENT EDUCATION: Education details: Reviewed technique for Kinesiotape application Person educated: Patient Education method: Explanation, demo Education comprehension: verbalized understanding HOME EXERCISE PROGRAM: Continue deep tissue massage; ice massage, firm blue theraputty, kinesiotape for fluid mobilization at the L volar palm to mid volar forearm  GOALS: Goals reviewed with patient? Yes  SHORT TERM GOALS: Target date: 10/05/22  Pt will be indep to perform HEP for improving L hand strength for daily tasks. Baseline: Eval: HEP not yet initiated Goal status: INITIAL  LONG TERM GOALS: Target date: 10/19/2022  Pt will increase FOTO score to 36 or better to indicate  improvement in self perceived functional use of the L hand with daily tasks. Baseline: Eval: 29 Goal status: INITIAL  2.  Pt will increase L grip strength by 10 or more lbs in order to improve tolerance to hold and support heavy pots and pans in the L hand. Baseline: Eval: L 41 lbs (R 62 lbs) Goal status: INITIAL  3.  Pt will tolerate manual therapy, therapeutic modalities, and exercises to decrease pain and swelling in L hand to a reported 1/10 pain or less with activity.  Baseline: Eval: mild edema L thenar eminence, 2/10 pain  with activity. Goal status: INITIAL  ASSESSMENT:  CLINICAL IMPRESSION: Pt reports she will return to Dr. Stephenie Acres tomorrow for her ortho follow up for her hand.  Pt continues to report that her pain and swelling in the palm is slightly better, but she feels like the problem area (edema) migrates a little around the incision site.  Pt encouraged to mention this at her follow up appointment with Dr. Stephenie Acres tomorrow.  Pt also interested in trying ultrasound in conjunction with the remaining iontophoresis treatments.  OT filled out script for MD to sign if MD agrees to add ultrasound to OT poc.  Pt will bring script with her to her appointment with Dr. Stephenie Acres tomorrow.  Pt continues to tolerate iontophoresis well at 2.0 mA current amplitude.  Pt reports she took the kinesiotape off her hand last visit after only 3 hours, because she didn't find much benefit.  OT encouraged pt to apply tape again and leave on for the entire day as tolerated, and reviewed application technique, applying tape at 50-75% stretch.  Pt agreed she would try this again for longer duration and verbalized understanding of technique for tape application.  Will continue with iontophoresis tx as tolerated for up to 10 txs.  PERFORMANCE DEFICITS: in functional skills including IADLs, sensation, edema, strength, pain, fascial restrictions, skin integrity, and UE functional use.  IMPAIRMENTS: are limiting patient  from IADLs.   COMORBIDITIES: may have co-morbidities  that affects occupational performance. Patient will benefit from skilled OT to address above impairments and improve overall function.  MODIFICATION OR ASSISTANCE TO COMPLETE EVALUATION: No modification of tasks or assist necessary to complete an evaluation.  OT OCCUPATIONAL PROFILE AND HISTORY: Problem focused assessment: Including review of records relating to presenting problem.  CLINICAL DECISION MAKING: Moderate - several treatment options, min-mod task modification necessary  REHAB POTENTIAL: Good  EVALUATION COMPLEXITY: Moderate      PLAN:  OT FREQUENCY: 2x/week  OT DURATION: other: 5 weeks  PLANNED INTERVENTIONS: self care/ADL training, therapeutic exercise, therapeutic activity, manual therapy, scar mobilization, iontophoresis, moist heat, cryotherapy, contrast bath, patient/family education, and DME and/or AE instructions  RECOMMENDED OTHER SERVICES: none at this time.  CONSULTED AND AGREED WITH PLAN OF CARE: Patient  PLAN FOR NEXT SESSION: iontophoresis  Danelle Earthly, MS, OTR/L  Otis Dials, OT 09/24/2022, 2:00 PM

## 2022-09-26 ENCOUNTER — Ambulatory Visit: Payer: Medicare Other

## 2022-09-26 DIAGNOSIS — M79642 Pain in left hand: Secondary | ICD-10-CM | POA: Diagnosis not present

## 2022-09-26 DIAGNOSIS — Z9889 Other specified postprocedural states: Secondary | ICD-10-CM | POA: Diagnosis not present

## 2022-09-26 DIAGNOSIS — R600 Localized edema: Secondary | ICD-10-CM

## 2022-09-26 DIAGNOSIS — M6281 Muscle weakness (generalized): Secondary | ICD-10-CM | POA: Diagnosis not present

## 2022-09-27 NOTE — Therapy (Signed)
OUTPATIENT OCCUPATIONAL THERAPY ORTHO TREATMENT NOTE  Patient Name: Julia Garner MRN: [id number] DOB:Jun 20, 1950, 72 y.o., female Today's Date: 09/27/2022  PCP: Dr. Malva Limes REFERRING PROVIDER: Dr. Berta Minor  END OF SESSION:  OT End of Session - 09/27/22 0945     Visit Number 6    Number of Visits 10    Date for OT Re-Evaluation 10/19/22    Progress Note Due on Visit 10    OT Start Time 1315    OT Stop Time 1348    OT Time Calculation (min) 33 min    Activity Tolerance Patient tolerated treatment well    Behavior During Therapy [facility] for tasks assessed/performed            Past Medical History:  Diagnosis Date   Allergy    Chronic ITP (idiopathic thrombocytopenia) (HCC)    Chronic urticaria 08/15/2018   COVID 2023   Depression    Head injuries    History of seizure 2007   one seizure    Hypertension    Past Surgical History:  Procedure Laterality Date   CATARACT EXTRACTION, BILATERAL  around 2021   Dr. Clydene Pugh   CESAREAN SECTION  1990   CHOLECYSTECTOMY  2005   COLONOSCOPY WITH PROPOFOL N/A 12/20/2020   Procedure: COLONOSCOPY WITH PROPOFOL;  Surgeon: Midge Minium, MD;  Location: [facility] ENDOSCOPY;  Service: Endoscopy;  Laterality: N/A;   CYST EXCISION  1996   Tracheal Cyst   Patient Active Problem List   Diagnosis Date Noted   Hypocalcemia 01/29/2022   Leukocytosis 07/25/2021   COVID 07/25/2021   Uncontrolled type 2 diabetes mellitus with hyperglycemia, without long-term current use of insulin (HCC) 07/24/2021   Hypokalemia 07/24/2021   Carpal tunnel syndrome 07/20/2021   Connective tissue disease (HCC) 07/20/2021   Hyperplastic colon polyp    SVT (supraventricular tachycardia) 06/08/2020   Bilateral carpal tunnel syndrome 07/31/2019   Aortic atherosclerosis (HCC) 11/04/2018   Fatty liver 11/04/2018   Chronic urticaria 08/15/2018   Chronic ITP (idiopathic thrombocytopenia) (HCC) 08/15/2018   Recurrent infections 08/15/2018   Diabetes  mellitus without complication (HCC) 09/15/2017   Other seasonal allergic rhinitis 06/29/2014   Insomnia 06/29/2014   Arthralgia of temporomandibular joint 06/29/2014   Edema 06/29/2014   Oral aphthae 07/08/2007   Cutaneous lipodystrophy 06/11/2007   Arthropathy of hand 01/25/2007   Fam hx-ischem heart disease 01/21/2007   Menopausal and postmenopausal disorder 10/08/2005   Thrombocytopenia (HCC) 10/08/2005   Obstructive sleep apnea of adult 10/24/2004   Essential (primary) hypertension 01/16/2004   Mood disorder (HCC) 01/15/2002   ONSET DATE: 07/05/22  REFERRING DIAG: S/p L carpal tunnel release  THERAPY DIAG:  Muscle weakness (generalized)  Pain in left hand  S/p bilateral carpal tunnel release  Hand edema  Rationale for Evaluation and Treatment: Rehabilitation  SUBJECTIVE:  SUBJECTIVE STATEMENT: Pt reports she had a good visit with Dr. Stephenie Acres yesterday and the doctor signed the prescription to approve therapeutic ultrasound to use in conjunction with the iontophoresis.  Pt reports that she was told if her hand continues to be bothersome, Dr. Stephenie Acres will order an MRI after pt returns from her trip in November.   Pt accompanied by: self  PERTINENT HISTORY: S/p carpal tunnel release on 07/05/22.  Pt remains with tenderness to the L palm and an area of swelling and likely scar tissue build up to the L lateral from incision site at base of thumb at the thenar eminence.  PRECAUTIONS: None  RED FLAGS: None  WEIGHT BEARING RESTRICTIONS: No  PAIN:  Are you having pain? Yes: NPRS scale: 0/10 at rest, with touch 1 /10 Pain location: L palm Pain description: tender, sometimes sharp Aggravating factors: holding pots/pans Relieving factors: advil on occasion; avoiding use of the hand  LIVING ENVIRONMENT: Lives with: lives with their spouse  PLOF: Independent  PATIENT GOALS: "Get rid of the pain and the swelling."   NEXT MD VISIT: ~2 weeks; 09/25/22 to Dr.  Mathis Bud  OBJECTIVE:   HAND DOMINANCE: Right   ADLs: Overall ADLs: indep but with some occasional pain when using L hand to help with lifting heavy pots/pans when cooking.  FUNCTIONAL OUTCOME MEASURES: FOTO: 65; predicted 73  UPPER EXTREMITY ROM:  Bilat forearm/wrist/hand WNL   UPPER EXTREMITY MMT:   Bilat forearm/wrist WNL; see weakness in L hand noted below.  HAND FUNCTION: Grip strength: Right: 62 lbs; Left: 41 lbs, Lateral pinch: Right: 14 lbs, Left: 10 lbs, and 3 point pinch: Right: 12 lbs, Left: 9 lbs  COORDINATION: 9 Hole Peg test: Right: 19 sec; Left: 21 sec  SENSATION: Light touch: occasional tingling L hand digits  EDEMA: small pocket of swelling in L hand thenar eminence just laterally to the L of carpal tunnel release incision.  COGNITION: Overall cognitive status: Within functional limits for tasks assessed  OBSERVATIONS:  Pt pleasant, cooperate, eager to begin iontophoresis.    TODAY'S TREATMENT:                                                                                                                              DATE: 09/26/22 Therapeutic Ultrasound: 8 min. Tx area: L carpal tunnel, thenar eminence, and hypothenar eminence; non-thermal parameters indicated for use for delayed tissue healing with prolonged inflammation; Duty cycle: 20%, Frequency 3.3 mHz, intensity: 1.0.  Tolerated well.  Iontophoresis: tx time: 20 min.  L volar palm at the base of the L thenar eminence lateral to carpal tunnel incision, 1.5 cc dexamethasone, 40 mA/min, current amplitude 2.0 mA.    Iontophoresis patch left on for pt to remove at home in 1-2 hours.  Pt to perform skin check following removal of patch and relay any concerns as needed.  PATIENT EDUCATION: Education details: Ultrasound parameters chosen for delayed tissue healing with prolonged inflammation Person educated: Patient Education method: Explanation Education comprehension: verbalized  understanding HOME EXERCISE PROGRAM: Continue deep tissue massage; ice massage, firm blue theraputty, kinesiotape for fluid mobilization at the L volar palm to mid volar forearm  GOALS: Goals reviewed with patient? Yes  SHORT TERM GOALS: Target date: 10/05/22  Pt will be indep to perform HEP for improving L hand strength for daily tasks. Baseline: Eval: HEP not yet initiated Goal status: INITIAL  LONG TERM GOALS: Target date: 10/19/2022  Pt will increase FOTO score to 36 or better to indicate improvement in self perceived functional use of the L hand with daily tasks. Baseline: Eval: 29 Goal status: INITIAL  2.  Pt will  increase L grip strength by 10 or more lbs in order to improve tolerance to hold and support heavy pots and pans in the L hand. Baseline: Eval: L 41 lbs (R 62 lbs) Goal status: INITIAL  3.  Pt will tolerate manual therapy, therapeutic modalities, and exercises to decrease pain and swelling in L hand to a reported 1/10 pain or less with activity.  Baseline: Eval: mild edema L thenar eminence, 2/10 pain with activity. Goal status: INITIAL  ASSESSMENT:  CLINICAL IMPRESSION: Pt reports she had a good visit with Dr. Stephenie Acres yesterday and the doctor signed the prescription to approve therapeutic ultrasound to use in conjunction with the iontophoresis.  New script signed for OT 3x per week for 4 weeks.  Pt tolerated ultrasound well today to the L palm at above noted parameters, indicated for use for delayed tissue healing with prolonged inflammation.  Good tolerance to iontophoresis as well.  Skin integrity remains intact with pt consistently completing her own skin checks when she removes ionto patch at home 1-2 hours following each tx.  Pt reports her L hand is slightly less tender with touch, but pt was told if her hand continues to be bothersome, Dr. Stephenie Acres will order an MRI after pt returns from her extended trip out of the country, beginning in October and returning in  November.  Pt reported that she did do a trial with the Kinesiotape for ~12 hours since last seen by OT, but did not find much benefit for reducing the swelling isolated in her palm, and the tape left a mild skin irritation.  Pt will plan to discontinue Kinesiotape.  Will continue with therapeutic ultrasound followed by iontophoresis tx as tolerated to reduce pain and swelling in L carpal tunnel region.   PERFORMANCE DEFICITS: in functional skills including IADLs, sensation, edema, strength, pain, fascial restrictions, skin integrity, and UE functional use.  IMPAIRMENTS: are limiting patient from IADLs.   COMORBIDITIES: may have co-morbidities  that affects occupational performance. Patient will benefit from skilled OT to address above impairments and improve overall function.  MODIFICATION OR ASSISTANCE TO COMPLETE EVALUATION: No modification of tasks or assist necessary to complete an evaluation.  OT OCCUPATIONAL PROFILE AND HISTORY: Problem focused assessment: Including review of records relating to presenting problem.  CLINICAL DECISION MAKING: Moderate - several treatment options, min-mod task modification necessary  REHAB POTENTIAL: Good  EVALUATION COMPLEXITY: Moderate      PLAN:  OT FREQUENCY: 2x/week; New script signed for 3x/week for 4 weeks by Dr. Stephenie Acres on 09/25/22 to include addition of therapeutic ultrasound modality for L palm.  OT DURATION: other: 5 weeks  PLANNED INTERVENTIONS: self care/ADL training, therapeutic exercise, therapeutic activity, manual therapy, scar mobilization, iontophoresis, moist heat, cryotherapy, contrast bath, patient/family education, and DME and/or AE instructions  RECOMMENDED OTHER SERVICES: none at this time.  CONSULTED AND AGREED WITH PLAN OF CARE: Patient  PLAN FOR NEXT SESSION: iontophoresis  Danelle Earthly, MS, OTR/L  Otis Dials, OT 09/27/2022, 9:49 AM

## 2022-09-28 ENCOUNTER — Ambulatory Visit (INDEPENDENT_AMBULATORY_CARE_PROVIDER_SITE_OTHER): Payer: Medicare Other | Admitting: Family Medicine

## 2022-09-28 ENCOUNTER — Other Ambulatory Visit: Payer: Self-pay | Admitting: Family Medicine

## 2022-09-28 ENCOUNTER — Ambulatory Visit: Payer: Medicare Other

## 2022-09-28 DIAGNOSIS — R600 Localized edema: Secondary | ICD-10-CM

## 2022-09-28 DIAGNOSIS — M6281 Muscle weakness (generalized): Secondary | ICD-10-CM | POA: Diagnosis not present

## 2022-09-28 DIAGNOSIS — M79642 Pain in left hand: Secondary | ICD-10-CM | POA: Diagnosis not present

## 2022-09-28 DIAGNOSIS — Z9889 Other specified postprocedural states: Secondary | ICD-10-CM

## 2022-09-28 DIAGNOSIS — J302 Other seasonal allergic rhinitis: Secondary | ICD-10-CM | POA: Diagnosis not present

## 2022-09-28 MED ORDER — SIMVASTATIN 40 MG PO TABS: 20.0000 mg | ORAL_TABLET | Freq: Every day | ORAL | Status: DC

## 2022-09-28 MED ORDER — FEXOFENADINE-PSEUDOEPHED ER 180-240 MG PO TB24
1.0000 | ORAL_TABLET | Freq: Every day | ORAL | 1 refills | Status: DC
Start: 2022-09-28 — End: 2023-01-18

## 2022-09-28 NOTE — Progress Notes (Signed)
Established patient visit   Patient: Julia Garner   DOB: 04/02/1950   72 y.o. Female  MRN: [id number] Visit Date: 09/28/2022  Today's healthcare provider: Mila Merry, MD   Chief Complaint  Patient presents with   Hyperlipidemia   Hypertension   Subjective    Discussed the use of AI scribe software for clinical note transcription with the patient, who gave verbal consent to proceed.  History of Present Illness   The patient, with a history of hypertension, hyperlipidemia, and sinus issues, presents with a persistent pressure sensation in the back of the left eye and associated headaches. Despite daily use of Allegra D and prednisone eye drops, the symptoms persist. The patient also reports needing to take Advil for symptom relief.  The patient has sought multiple consultations, including a neurologist and an allergist/ENT at [facility], to identify the cause of these symptoms. An MRI of the head and sinuses were performed, with the patient reporting that the results appeared normal. However, the pressure sensation in the eye and headaches continue.  The patient also reports plans to travel to [place] for 2 months and has requested prescriptions for Allegra D, Simvastatin, and Irbesartan to last the duration of the trip. The patient has been managing her hypertension and hyperlipidemia with these medications.  The patient also has concerns about her cholesterol levels and has inquired about reducing the dose of Simvastatin.       Medications: Outpatient Medications Prior to Visit  Medication Sig   ascorbic acid (VITAMIN C) 500 MG tablet Take 500 mg by mouth daily.   Blood Glucose Monitoring Suppl (ONE TOUCH ULTRA 2) w/Device KIT Use to check sugar daily for type 2 diabetes E11.9   Calcium Carbonate (CALCIUM 500 PO) Take 1 tablet by mouth daily.   clobetasol ointment (TEMOVATE) 0.05 % Apply 1 Application topically 2 (two) times daily. Try to use for only two weeks at a time.    glucose blood (ONETOUCH ULTRA) test strip Use to check sugar twice daily for type 2 diabetes E11.9   irbesartan (AVAPRO) 75 MG tablet Take 1 tablet (75 mg total) by mouth at bedtime.   Lancets (ONETOUCH ULTRASOFT) lancets Use to check sugar twice daily for type 2 diabetes E11.9   magnesium gluconate (MAGONATE) 500 MG tablet Take 500 mg by mouth daily.   Multiple Vitamin (MULTIVITAMIN) tablet Take 1 tablet by mouth daily.   Naproxen Sodium 220 MG CAPS Take by mouth daily as needed.   omalizumab Geoffry Paradise) 150 MG/ML prefilled syringe Inject 300 mg into the skin every 14 (fourteen) days.   Omega 3 1200 MG CAPS Take 2 capsules by mouth daily.   predniSONE (DELTASONE) 10 MG tablet Take 3 tabs (30mg ) twice daily for 3 days, then 2 tabs (20mg ) twice daily for 3 days, then 1 tab (10mg ) twice daily for 3 days, then STOP.   Semaglutide,0.25 or 0.5MG /DOS, 2 MG/3ML SOPN 0.25mg  once a week for 4 weeks then increase to 0.5mg    sertraline (ZOLOFT) 50 MG tablet TAKE 2 TABLETS BY MOUTH DAILY   traZODone (DESYREL) 50 MG tablet TAKE 1 TO 2 TABLETS BY MOUTH AT  BEDTIME   Ubiquinol 200 MG CAPS Take by mouth. CoQ10   [DISCONTINUED] fexofenadine-pseudoephedrine (ALLEGRA-D ALLERGY & CONGESTION) 180-240 MG 24 hr tablet Take 1 tablet by mouth daily.   [DISCONTINUED] simvastatin (ZOCOR) 40 MG tablet TAKE 1 TABLET BY MOUTH AT  BEDTIME   Facility-Administered Medications Prior to Visit  Medication Dose Route Frequency Provider  omalizumab Geoffry Paradise) injection 300 mg  300 mg Subcutaneous Q28 days Alfonse Spruce, MD   Review of Systems     Objective    BP (!) 140/67 (BP Location: Left Arm, Patient Position: Sitting, Cuff Size: Large)   Pulse 98   Ht 5\' 1"  (1.549 m)   Wt 150 lb 6.4 oz (68.2 kg)   SpO2 100%   BMI 28.42 kg/m   Physical Exam   General: Appearance:    Well developed, well nourished female in no acute distress  Eyes:    PERRL, conjunctiva/corneas clear, EOM's intact       Lungs:     Clear to  auscultation bilaterally, respirations unlabored  Heart:    Normal heart rate. Normal rhythm. No murmurs, rubs, or gallops.    MS:   All extremities are intact.    Neurologic:   Awake, alert, oriented x 3. No apparent focal neurological defect.         Assessment & Plan        Headache and Eye Pressure Persistent despite use of prednisone eye drops and Allegra D. Recent MRI of brain and sinuses reportedly normal. Patient to provide copy of sinus MRI for review. -Review sinus MRI report when available. -Continue current treatment until further information is available.  Allergic Rhinitis Managed with daily Allegra D. Patient planning to travel and requires extended supply. -Send prescription for 66-month supply of Allegra D to [facility].  Hyperlipidemia Well controlled with Simvastatin 40mg . Patient requests dose reduction. -Reduce Simvastatin to 20mg  daily. Recheck lipid panel at next visit.  Hypertension Managed with Irbesartan. Patient requires refill for travel. -Send prescription for 92-month supply of Irbesartan to [facility].  -Increase ozempic to 0.5mg  as originally planned.    Return in about 3 months (around 12/28/2022).      Mila Merry, MD  [facility] [phone] (phone) [phone] (fax)  [facility]

## 2022-09-28 NOTE — Patient Instructions (Signed)
.   Please review the attached list of medications and notify my office if there are any errors.   . Please bring all of your medications to every appointment so we can make sure that our medication list is the same as yours.   

## 2022-09-28 NOTE — Therapy (Signed)
OUTPATIENT OCCUPATIONAL THERAPY ORTHO TREATMENT NOTE  Patient Name: Julia Garner MRN: [id number] DOB:[birth year], 72 y.o., female Today's Date: 09/28/2022  PCP: Dr. Malva Limes REFERRING PROVIDER: Dr. Berta Minor  END OF SESSION:  OT End of Session - 09/28/22 1013     Visit Number 7    Number of Visits 10    Date for OT Re-Evaluation 10/19/22    Progress Note Due on Visit 10    OT Start Time 1015    OT Stop Time 1100    OT Time Calculation (min) 45 min    Activity Tolerance Patient tolerated treatment well    Behavior During Therapy [facility] for tasks assessed/performed            Past Medical History:  Diagnosis Date   Allergy    Chronic ITP (idiopathic thrombocytopenia) (HCC)    Chronic urticaria 08/15/2018   COVID 2023   Depression    Head injuries    History of seizure 2007   one seizure    Hypertension    Past Surgical History:  Procedure Laterality Date   CATARACT EXTRACTION, BILATERAL  around 2021   Dr. Clydene Pugh   CESAREAN SECTION  1990   CHOLECYSTECTOMY  2005   COLONOSCOPY WITH PROPOFOL N/A 12/20/2020   Procedure: COLONOSCOPY WITH PROPOFOL;  Surgeon: Midge Minium, MD;  Location: [facility] ENDOSCOPY;  Service: Endoscopy;  Laterality: N/A;   CYST EXCISION  1996   Tracheal Cyst   Patient Active Problem List   Diagnosis Date Noted   Hypocalcemia 01/29/2022   Leukocytosis 07/25/2021   COVID 07/25/2021   Uncontrolled type 2 diabetes mellitus with hyperglycemia, without long-term current use of insulin (HCC) 07/24/2021   Hypokalemia 07/24/2021   Carpal tunnel syndrome 07/20/2021   Connective tissue disease (HCC) 07/20/2021   Hyperplastic colon polyp    SVT (supraventricular tachycardia) 06/08/2020   Bilateral carpal tunnel syndrome 07/31/2019   Aortic atherosclerosis (HCC) 11/04/2018   Fatty liver 11/04/2018   Chronic urticaria 08/15/2018   Chronic ITP (idiopathic thrombocytopenia) (HCC) 08/15/2018   Recurrent infections 08/15/2018   Diabetes  mellitus without complication (HCC) 09/15/2017   Other seasonal allergic rhinitis 06/29/2014   Insomnia 06/29/2014   Arthralgia of temporomandibular joint 06/29/2014   Edema 06/29/2014   Oral aphthae 07/08/2007   Cutaneous lipodystrophy 06/11/2007   Arthropathy of hand 01/25/2007   Fam hx-ischem heart disease 01/21/2007   Menopausal and postmenopausal disorder 10/08/2005   Thrombocytopenia (HCC) 10/08/2005   Obstructive sleep apnea of adult 10/24/2004   Essential (primary) hypertension 01/16/2004   Mood disorder (HCC) 01/15/2002   ONSET DATE: 07/05/22  REFERRING DIAG: S/p L carpal tunnel release  THERAPY DIAG:  Muscle weakness (generalized)  S/p bilateral carpal tunnel release  Hand edema  Rationale for Evaluation and Treatment: Rehabilitation  SUBJECTIVE:  SUBJECTIVE STATEMENT: Pt reports she thinks here area of swelling/tenderness is improving. Pt accompanied by: self  PERTINENT HISTORY: S/p carpal tunnel release on 07/05/22.  Pt remains with tenderness to the L palm and an area of swelling and likely scar tissue build up to the L lateral from incision site at base of thumb at the thenar eminence.  PRECAUTIONS: None  RED FLAGS: None   WEIGHT BEARING RESTRICTIONS: No  PAIN:  Are you having pain? Yes: NPRS scale: 0/10 at rest, with touch 1 /10 Pain location: L palm Pain description: tender, sometimes sharp Aggravating factors: holding pots/pans Relieving factors: advil on occasion; avoiding use of the hand  LIVING ENVIRONMENT: Lives with: lives with their spouse  PLOF: Independent  PATIENT GOALS: "Get rid of the pain and the swelling."   NEXT MD VISIT: ~2 weeks; 09/25/22 to Dr. Mathis Bud  OBJECTIVE:   HAND DOMINANCE: Right   ADLs: Overall ADLs: indep but with some occasional pain when using L hand to help with lifting heavy pots/pans when cooking.  FUNCTIONAL OUTCOME MEASURES: FOTO: 65; predicted 73  UPPER EXTREMITY ROM:  Bilat forearm/wrist/hand  WNL   UPPER EXTREMITY MMT:   Bilat forearm/wrist WNL; see weakness in L hand noted below.  HAND FUNCTION: Grip strength: Right: 62 lbs; Left: 41 lbs, Lateral pinch: Right: 14 lbs, Left: 10 lbs, and 3 point pinch: Right: 12 lbs, Left: 9 lbs  COORDINATION: 9 Hole Peg test: Right: 19 sec; Left: 21 sec  SENSATION: Light touch: occasional tingling L hand digits  EDEMA: small pocket of swelling in L hand thenar eminence just laterally to the L of carpal tunnel release incision.  COGNITION: Overall cognitive status: Within functional limits for tasks assessed  OBSERVATIONS:  Pt pleasant, cooperate, eager to begin iontophoresis.    TODAY'S TREATMENT:                                                                                                                              DATE: 09/28/22 Therapeutic Ultrasound: 8 min. Tx area: L carpal tunnel, thenar eminence, and hypothenar eminence; non-thermal parameters indicated for use for delayed tissue healing with prolonged inflammation; Duty cycle: 20%, Frequency 3.3 mHz, intensity: 1.0.  Tolerated well.  Iontophoresis: tx time: 20 min.  L volar palm at the base of the L thenar eminence lateral to carpal tunnel incision, 1.5 cc dexamethasone, 40 mA/min, current amplitude 2.0 mA.    Iontophoresis patch left on for pt to remove at home in 1-2 hours.  Pt to perform skin check following removal of patch and relay any concerns as needed.  PATIENT EDUCATION: Education details: Ultrasound parameters chosen for delayed tissue healing with prolonged inflammation Person educated: Patient Education method: Explanation Education comprehension: verbalized understanding HOME EXERCISE PROGRAM: Continue deep tissue massage; ice massage, firm blue theraputty, kinesiotape for fluid mobilization at the L volar palm to mid volar forearm  GOALS: Goals reviewed with patient? Yes  SHORT TERM GOALS: Target date: 10/05/22  Pt will be indep to perform HEP for  improving L hand strength for daily tasks. Baseline: Eval: HEP not yet initiated Goal status: INITIAL  LONG TERM GOALS: Target date: 10/19/2022  Pt will increase FOTO score to 36 or better to indicate improvement in self perceived functional use of the L hand with daily tasks. Baseline: Eval: 29 Goal status: INITIAL  2.  Pt will increase L grip strength by 10 or more lbs in order to improve tolerance to hold and support heavy pots and pans in the L hand. Baseline: Eval: L 41 lbs (R 62 lbs) Goal status: INITIAL  3.  Pt will tolerate manual therapy, therapeutic modalities, and exercises to decrease pain and swelling  in L hand to a reported 1/10 pain or less with activity.  Baseline: Eval: mild edema L thenar eminence, 2/10 pain with activity. Goal status: INITIAL  ASSESSMENT:  CLINICAL IMPRESSION: Pt tolerated ultrasound well today to the L palm at above noted parameters. Good tolerance to iontophoresis as well. Reprots the area of swelling may be decreasing. Continues to perform HEP with no concerns. Will continue with therapeutic ultrasound followed by iontophoresis tx as tolerated to reduce pain and swelling in L carpal tunnel region.   PERFORMANCE DEFICITS: in functional skills including IADLs, sensation, edema, strength, pain, fascial restrictions, skin integrity, and UE functional use.  IMPAIRMENTS: are limiting patient from IADLs.   COMORBIDITIES: may have co-morbidities  that affects occupational performance. Patient will benefit from skilled OT to address above impairments and improve overall function.  MODIFICATION OR ASSISTANCE TO COMPLETE EVALUATION: No modification of tasks or assist necessary to complete an evaluation.  OT OCCUPATIONAL PROFILE AND HISTORY: Problem focused assessment: Including review of records relating to presenting problem.  CLINICAL DECISION MAKING: Moderate - several treatment options, min-mod task modification necessary  REHAB POTENTIAL:  Good  EVALUATION COMPLEXITY: Moderate      PLAN:  OT FREQUENCY: 2x/week; New script signed for 3x/week for 4 weeks by Dr. Stephenie Acres on 09/25/22 to include addition of therapeutic ultrasound modality for L palm.  OT DURATION: other: 5 weeks  PLANNED INTERVENTIONS: self care/ADL training, therapeutic exercise, therapeutic activity, manual therapy, scar mobilization, iontophoresis, moist heat, cryotherapy, contrast bath, patient/family education, and DME and/or AE instructions  RECOMMENDED OTHER SERVICES: none at this time.  CONSULTED AND AGREED WITH PLAN OF CARE: Patient  PLAN FOR NEXT SESSION: iontophoresis  Kathie Dike, M.S. OTR/L  09/28/22, 10:14 AM  ascom [phone]   Presley Raddle, OT 09/28/2022, 10:14 AM

## 2022-10-01 ENCOUNTER — Ambulatory Visit: Payer: Medicare Other

## 2022-10-01 DIAGNOSIS — Z9889 Other specified postprocedural states: Secondary | ICD-10-CM | POA: Diagnosis not present

## 2022-10-01 DIAGNOSIS — M6281 Muscle weakness (generalized): Secondary | ICD-10-CM | POA: Diagnosis not present

## 2022-10-01 DIAGNOSIS — R600 Localized edema: Secondary | ICD-10-CM | POA: Diagnosis not present

## 2022-10-01 DIAGNOSIS — M79642 Pain in left hand: Secondary | ICD-10-CM

## 2022-10-01 NOTE — Telephone Encounter (Signed)
Requested Prescriptions  Pending Prescriptions Disp Refills   irbesartan (AVAPRO) 75 MG tablet [Pharmacy Med Name: IRBESARTAN  75MG   TAB] 90 tablet 1    Sig: TAKE 1 TABLET BY MOUTH AT  BEDTIME     Cardiovascular:  Angiotensin Receptor Blockers Passed - 09/28/2022 12:23 PM      Passed - Cr in normal range and within 180 days    Creatinine, Ser  Date Value Ref Range Status  07/30/2022 0.73 0.44 - 1.00 mg/dL Final         Passed - K in normal range and within 180 days    Potassium  Date Value Ref Range Status  07/30/2022 3.7 3.5 - 5.1 mmol/L Final         Passed - Patient is not pregnant      Passed - Last BP in normal range    BP Readings from Last 1 Encounters:  09/28/22 (!) 140/67         Passed - Valid encounter within last 6 months    Recent Outpatient Visits           3 days ago Other seasonal allergic rhinitis   [facility] Malva Limes, MD   4 months ago Other seasonal allergic rhinitis   [facility] Malva Limes, MD   5 months ago Diabetes mellitus without complication [facility])   [facility] Malva Limes, MD   8 months ago Diabetes mellitus without complication [facility])   [place] [facility] Malva Limes, MD   10 months ago Sebaceous cyst   [facility] Malva Limes, MD       Future Appointments             In 3 months Fisher, Demetrios Isaacs, MD [facility]   In 5 months Dellis Anes, Hetty Ely, MD [facility] Prescriptions Disp Refills   simvastatin (ZOCOR) 40 MG tablet [Pharmacy Med Name: Simvastatin 40 MG Oral Tablet] 100 tablet 2    Sig: TAKE 1 TABLET BY MOUTH AT  BEDTIME     Cardiovascular:  Antilipid - Statins Failed - 09/28/2022 12:23 PM      Failed - Lipid Panel in normal range within the last 12 months    Cholesterol,  Total  Date Value Ref Range Status  04/17/2022 129 100 - 199 mg/dL Final   LDL Chol Calc (NIH)  Date Value Ref Range Status  04/17/2022 59 0 - 99 mg/dL Final   HDL  Date Value Ref Range Status  04/17/2022 33 (L) >39 mg/dL Final   Triglycerides  Date Value Ref Range Status  04/17/2022 227 (H) 0 - 149 mg/dL Final         Passed - Patient is not pregnant      Passed - Valid encounter within last 12 months    Recent Outpatient Visits           3 days ago Other seasonal allergic rhinitis    [facility], MD   4 months ago Other seasonal allergic rhinitis    [facility] Malva Limes, MD   5 months ago Diabetes mellitus without complication [facility])    [facility] Malva Limes, MD   8 months ago Diabetes  mellitus without complication [facility])   [facility] Malva Limes, MD   10 months ago Sebaceous cyst   [facility] Malva Limes, MD       Future Appointments             In 3 months Fisher, Demetrios Isaacs, MD [facility]   In 5 months Dellis Anes, Hetty Ely, MD [facility]

## 2022-10-01 NOTE — Therapy (Signed)
OUTPATIENT OCCUPATIONAL THERAPY ORTHO TREATMENT NOTE  Patient Name: Julia Garner MRN: [id number] DOB:12-11-1950, 72 y.o., female Today's Date: 10/01/2022  PCP: Dr. Malva Limes REFERRING PROVIDER: Dr. Berta Minor  END OF SESSION:  OT End of Session - 10/01/22 1925     Visit Number 8    Number of Visits 10    Date for OT Re-Evaluation 10/19/22    Progress Note Due on Visit 10    OT Start Time 1315    OT Stop Time 1354    OT Time Calculation (min) 39 min    Activity Tolerance Patient tolerated treatment well    Behavior During Therapy [facility] for tasks assessed/performed            Past Medical History:  Diagnosis Date   Allergy    Chronic ITP (idiopathic thrombocytopenia) (HCC)    Chronic urticaria 08/15/2018   COVID 2023   Depression    Head injuries    History of seizure 2007   one seizure    Hypertension    Past Surgical History:  Procedure Laterality Date   CATARACT EXTRACTION, BILATERAL  around 2021   Dr. Clydene Pugh   CESAREAN SECTION  1990   CHOLECYSTECTOMY  2005   COLONOSCOPY WITH PROPOFOL N/A 12/20/2020   Procedure: COLONOSCOPY WITH PROPOFOL;  Surgeon: Midge Minium, MD;  Location: [facility]COPY;  Service: Endoscopy;  Laterality: N/A;   CYST EXCISION  1996   Tracheal Cyst   Patient Active Problem List   Diagnosis Date Noted   Hypocalcemia 01/29/2022   Leukocytosis 07/25/2021   COVID 07/25/2021   Uncontrolled type 2 diabetes mellitus with hyperglycemia, without long-term current use of insulin (HCC) 07/24/2021   Hypokalemia 07/24/2021   Carpal tunnel syndrome 07/20/2021   Connective tissue disease (HCC) 07/20/2021   Hyperplastic colon polyp    SVT (supraventricular tachycardia) 06/08/2020   Bilateral carpal tunnel syndrome 07/31/2019   Aortic atherosclerosis (HCC) 11/04/2018   Fatty liver 11/04/2018   Chronic urticaria 08/15/2018   Chronic ITP (idiopathic thrombocytopenia) (HCC) 08/15/2018   Recurrent infections 08/15/2018   Diabetes  mellitus without complication (HCC) 09/15/2017   Other seasonal allergic rhinitis 06/29/2014   Insomnia 06/29/2014   Arthralgia of temporomandibular joint 06/29/2014   Edema 06/29/2014   Oral aphthae 07/08/2007   Cutaneous lipodystrophy 06/11/2007   Arthropathy of hand 01/25/2007   Fam hx-ischem heart disease 01/21/2007   Menopausal and postmenopausal disorder 10/08/2005   Thrombocytopenia (HCC) 10/08/2005   Obstructive sleep apnea of adult 10/24/2004   Essential (primary) hypertension 01/16/2004   Mood disorder (HCC) 01/15/2002   ONSET DATE: 07/05/22  REFERRING DIAG: S/p L carpal tunnel release  THERAPY DIAG:  Muscle weakness (generalized)  S/p bilateral carpal tunnel release  Hand edema  Pain in left hand  Rationale for Evaluation and Treatment: Rehabilitation  SUBJECTIVE:  SUBJECTIVE STATEMENT: Pt continues to report the area of swelling/tenderness is improving.  Pt stated that she doesn't have any pain today, just discomfort. Pt accompanied by: self  PERTINENT HISTORY: S/p carpal tunnel release on 07/05/22.  Pt remains with tenderness to the L palm and an area of swelling and likely scar tissue build up to the L lateral from incision site at base of thumb at the thenar eminence.  PRECAUTIONS: None  RED FLAGS: None   WEIGHT BEARING RESTRICTIONS: No  PAIN:  Are you having pain? Yes: NPRS scale: 0/10 at rest, with touch 1 /10 Pain location: L palm Pain description: tender, sometimes sharp Aggravating factors: holding pots/pans Relieving factors:  advil on occasion; avoiding use of the hand  LIVING ENVIRONMENT: Lives with: lives with their spouse  PLOF: Independent  PATIENT GOALS: "Get rid of the pain and the swelling."   NEXT MD VISIT: ~2 weeks; 09/25/22 to Dr. Mathis Bud  OBJECTIVE:   HAND DOMINANCE: Right   ADLs: Overall ADLs: indep but with some occasional pain when using L hand to help with lifting heavy pots/pans when cooking.  FUNCTIONAL  OUTCOME MEASURES: FOTO: 65; predicted 73  UPPER EXTREMITY ROM:  Bilat forearm/wrist/hand WNL   UPPER EXTREMITY MMT:   Bilat forearm/wrist WNL; see weakness in L hand noted below.  HAND FUNCTION: Grip strength: Right: 62 lbs; Left: 41 lbs, Lateral pinch: Right: 14 lbs, Left: 10 lbs, and 3 point pinch: Right: 12 lbs, Left: 9 lbs  COORDINATION: 9 Hole Peg test: Right: 19 sec; Left: 21 sec  SENSATION: Light touch: occasional tingling L hand digits  EDEMA: small pocket of swelling in L hand thenar eminence just laterally to the L of carpal tunnel release incision.  COGNITION: Overall cognitive status: Within functional limits for tasks assessed  OBSERVATIONS:  Pt pleasant, cooperate, eager to begin iontophoresis.    TODAY'S TREATMENT:                                                                                                                              DATE: 10/01/22 Therapeutic Ultrasound: 8 min. Tx area: L carpal tunnel, thenar eminence, and hypothenar eminence; non-thermal parameters indicated for use for delayed tissue healing with prolonged inflammation; Duty cycle: 20%, Frequency 3.3 mHz, intensity: 1.0.  Tolerated well.  Iontophoresis: tx time: 20 min.  L volar palm at the base of the L thenar eminence lateral to carpal tunnel incision, 1.5 cc dexamethasone, 40 mA/min, current amplitude 2.0 mA.    Iontophoresis patch left on for pt to remove at home in 1-2 hours.  Pt to perform skin check following removal of patch and relay any concerns as needed.  PATIENT EDUCATION: Education details: Ultrasound parameters chosen for delayed tissue healing with prolonged inflammation Person educated: Patient Education method: Explanation Education comprehension: verbalized understanding HOME EXERCISE PROGRAM: Continue deep tissue massage; ice massage, firm blue theraputty, kinesiotape for fluid mobilization at the L volar palm to mid volar forearm  GOALS: Goals reviewed with  patient? Yes  SHORT TERM GOALS: Target date: 10/05/22  Pt will be indep to perform HEP for improving L hand strength for daily tasks. Baseline: Eval: HEP not yet initiated Goal status: INITIAL  LONG TERM GOALS: Target date: 10/19/2022  Pt will increase FOTO score to 36 or better to indicate improvement in self perceived functional use of the L hand with daily tasks. Baseline: Eval: 29 Goal status: INITIAL  2.  Pt will increase L grip strength by 10 or more lbs in order to improve tolerance to hold and support heavy pots and pans in the L hand. Baseline: Eval: L 41 lbs (R 62 lbs) Goal status:  INITIAL  3.  Pt will tolerate manual therapy, therapeutic modalities, and exercises to decrease pain and swelling in L hand to a reported 1/10 pain or less with activity.  Baseline: Eval: mild edema L thenar eminence, 2/10 pain with activity. Goal status: INITIAL  ASSESSMENT:  CLINICAL IMPRESSION: Pt tolerated ultrasound well today to the L palm at above noted parameters. Good tolerance to iontophoresis as well.  Pt reports the area of swelling seems to continue to improve and would like to continue use the ultrasound as needed after completing the 10 planned iontophoresis treatments.  Pt continues to perform HEP with no concerns. Will continue with therapeutic ultrasound followed by iontophoresis tx as tolerated to reduce pain and swelling in L carpal tunnel region.   PERFORMANCE DEFICITS: in functional skills including IADLs, sensation, edema, strength, pain, fascial restrictions, skin integrity, and UE functional use.  IMPAIRMENTS: are limiting patient from IADLs.   COMORBIDITIES: may have co-morbidities  that affects occupational performance. Patient will benefit from skilled OT to address above impairments and improve overall function.  MODIFICATION OR ASSISTANCE TO COMPLETE EVALUATION: No modification of tasks or assist necessary to complete an evaluation.  OT OCCUPATIONAL PROFILE AND  HISTORY: Problem focused assessment: Including review of records relating to presenting problem.  CLINICAL DECISION MAKING: Moderate - several treatment options, min-mod task modification necessary  REHAB POTENTIAL: Good  EVALUATION COMPLEXITY: Moderate      PLAN:  OT FREQUENCY: 2x/week; New script signed for 3x/week for 4 weeks by Dr. Stephenie Acres on 09/25/22 to include addition of therapeutic ultrasound modality for L palm.  OT DURATION: other: 5 weeks  PLANNED INTERVENTIONS: self care/ADL training, therapeutic exercise, therapeutic activity, manual therapy, scar mobilization, iontophoresis, moist heat, cryotherapy, contrast bath, patient/family education, and DME and/or AE instructions  RECOMMENDED OTHER SERVICES: none at this time.  CONSULTED AND AGREED WITH PLAN OF CARE: Patient  PLAN FOR NEXT SESSION: iontophoresis  Otis Dials, OT 10/01/2022, 7:27 PM

## 2022-10-03 ENCOUNTER — Ambulatory Visit: Payer: Medicare Other

## 2022-10-03 DIAGNOSIS — R600 Localized edema: Secondary | ICD-10-CM

## 2022-10-03 DIAGNOSIS — M79642 Pain in left hand: Secondary | ICD-10-CM

## 2022-10-03 DIAGNOSIS — Z9889 Other specified postprocedural states: Secondary | ICD-10-CM

## 2022-10-03 DIAGNOSIS — M6281 Muscle weakness (generalized): Secondary | ICD-10-CM | POA: Diagnosis not present

## 2022-10-04 NOTE — Therapy (Signed)
OUTPATIENT OCCUPATIONAL THERAPY ORTHO TREATMENT NOTE  Patient Name: Julia Garner MRN: [id number] DOB:06-29-1950, 72 y.o., female Today's Date: 10/04/2022  PCP: Dr. Malva Limes REFERRING PROVIDER: Dr. Berta Minor  END OF SESSION:  OT End of Session - 10/04/22 0809     Visit Number 9    Number of Visits 10    Date for OT Re-Evaluation 10/19/22    Progress Note Due on Visit 10    OT Start Time 1315    OT Stop Time 1355    OT Time Calculation (min) 40 min    Activity Tolerance Patient tolerated treatment well    Behavior During Therapy [facility] for tasks assessed/performed            Past Medical History:  Diagnosis Date   Allergy    Chronic ITP (idiopathic thrombocytopenia) (HCC)    Chronic urticaria 08/15/2018   COVID 2023   Depression    Head injuries    History of seizure 2007   one seizure    Hypertension    Past Surgical History:  Procedure Laterality Date   CATARACT EXTRACTION, BILATERAL  around 2021   Dr. Clydene Pugh   CESAREAN SECTION  1990   CHOLECYSTECTOMY  2005   COLONOSCOPY WITH PROPOFOL N/A 12/20/2020   Procedure: COLONOSCOPY WITH PROPOFOL;  Surgeon: Midge Minium, MD;  Location: [facility] ENDOSCOPY;  Service: Endoscopy;  Laterality: N/A;   CYST EXCISION  1996   Tracheal Cyst   Patient Active Problem List   Diagnosis Date Noted   Hypocalcemia 01/29/2022   Leukocytosis 07/25/2021   COVID 07/25/2021   Uncontrolled type 2 diabetes mellitus with hyperglycemia, without long-term current use of insulin (HCC) 07/24/2021   Hypokalemia 07/24/2021   Carpal tunnel syndrome 07/20/2021   Connective tissue disease (HCC) 07/20/2021   Hyperplastic colon polyp    SVT (supraventricular tachycardia) 06/08/2020   Bilateral carpal tunnel syndrome 07/31/2019   Aortic atherosclerosis (HCC) 11/04/2018   Fatty liver 11/04/2018   Chronic urticaria 08/15/2018   Chronic ITP (idiopathic thrombocytopenia) (HCC) 08/15/2018   Recurrent infections 08/15/2018   Diabetes  mellitus without complication (HCC) 09/15/2017   Other seasonal allergic rhinitis 06/29/2014   Insomnia 06/29/2014   Arthralgia of temporomandibular joint 06/29/2014   Edema 06/29/2014   Oral aphthae 07/08/2007   Cutaneous lipodystrophy 06/11/2007   Arthropathy of hand 01/25/2007   Fam hx-ischem heart disease 01/21/2007   Menopausal and postmenopausal disorder 10/08/2005   Thrombocytopenia (HCC) 10/08/2005   Obstructive sleep apnea of adult 10/24/2004   Essential (primary) hypertension 01/16/2004   Mood disorder (HCC) 01/15/2002   ONSET DATE: 07/05/22  REFERRING DIAG: S/p L carpal tunnel release  THERAPY DIAG:  Muscle weakness (generalized)  S/p bilateral carpal tunnel release  Hand edema  Pain in left hand  Rationale for Evaluation and Treatment: Rehabilitation  SUBJECTIVE:  SUBJECTIVE STATEMENT: Pt states she would like to continue with ultrasound once she completes her last iontophoresis tx at the end of this week. Pt accompanied by: self  PERTINENT HISTORY: S/p carpal tunnel release on 07/05/22.  Pt remains with tenderness to the L palm and an area of swelling and likely scar tissue build up to the L lateral from incision site at base of thumb at the thenar eminence.  PRECAUTIONS: None  RED FLAGS: None   WEIGHT BEARING RESTRICTIONS: No  PAIN:  Are you having pain? Yes: NPRS scale: 0/10 at rest, with touch 1 /10 Pain location: L palm Pain description: tender, sometimes sharp Aggravating factors: holding pots/pans Relieving factors:  advil on occasion; avoiding use of the hand  LIVING ENVIRONMENT: Lives with: lives with their spouse  PLOF: Independent  PATIENT GOALS: "Get rid of the pain and the swelling."   NEXT MD VISIT: ~2 weeks; 09/25/22 to Dr. Mathis Bud  OBJECTIVE:   HAND DOMINANCE: Right   ADLs: Overall ADLs: indep but with some occasional pain when using L hand to help with lifting heavy pots/pans when cooking.  FUNCTIONAL OUTCOME  MEASURES: FOTO: 65; predicted 73  UPPER EXTREMITY ROM:  Bilat forearm/wrist/hand WNL   UPPER EXTREMITY MMT:   Bilat forearm/wrist WNL; see weakness in L hand noted below.  HAND FUNCTION: Grip strength: Right: 62 lbs; Left: 41 lbs, Lateral pinch: Right: 14 lbs, Left: 10 lbs, and 3 point pinch: Right: 12 lbs, Left: 9 lbs  COORDINATION: 9 Hole Peg test: Right: 19 sec; Left: 21 sec  SENSATION: Light touch: occasional tingling L hand digits  EDEMA: small pocket of swelling in L hand thenar eminence just laterally to the L of carpal tunnel release incision.  COGNITION: Overall cognitive status: Within functional limits for tasks assessed  OBSERVATIONS:  Pt pleasant, cooperate, eager to begin iontophoresis.    TODAY'S TREATMENT:                                                                                                                              DATE: 10/03/22 Therapeutic Ultrasound: 8 min. Tx area: L carpal tunnel, thenar eminence, and hypothenar eminence; non-thermal parameters indicated for use for delayed tissue healing with prolonged inflammation; Duty cycle: 20%, Frequency 3.3 mHz, intensity: 1.0.  Tolerated well.  Iontophoresis: tx time: 26 min.  L volar palm at the base of the L thenar eminence lateral to carpal tunnel incision, 1.6 cc dexamethasone, 40 mA/min, current amplitude 2.0 mA.    PATIENT EDUCATION: Education details: Ultrasound parameters chosen for delayed tissue healing with prolonged inflammation Person educated: Patient Education method: Explanation Education comprehension: verbalized understanding HOME EXERCISE PROGRAM: Continue deep tissue massage; ice massage, firm blue theraputty, kinesiotape for fluid mobilization at the L volar palm to mid volar forearm  GOALS: Goals reviewed with patient? Yes  SHORT TERM GOALS: Target date: 10/05/22  Pt will be indep to perform HEP for improving L hand strength for daily tasks. Baseline: Eval: HEP not yet  initiated Goal status: INITIAL  LONG TERM GOALS: Target date: 10/19/2022  Pt will increase FOTO score to 36 or better to indicate improvement in self perceived functional use of the L hand with daily tasks. Baseline: Eval: 29 Goal status: INITIAL  2.  Pt will increase L grip strength by 10 or more lbs in order to improve tolerance to hold and support heavy pots and pans in the L hand. Baseline: Eval: L 41 lbs (R 62 lbs) Goal status: INITIAL  3.  Pt will tolerate manual therapy, therapeutic modalities, and exercises to decrease pain and swelling in L hand to a reported 1/10 pain or less with activity.  Baseline: Eval: mild edema L thenar eminence, 2/10 pain with activity. Goal status: INITIAL  ASSESSMENT: CLINICAL IMPRESSION: Pt continues to tolerate ultrasound well today to the L palm at above noted parameters. Good tolerance to iontophoresis as well.  Electrode patch removed end of session per pt request.  Pt feels the area of swelling seems to be responding well to the combination of [place] and iontophoresis.  Pt continues to perform HEP with no concerns. Will continue with therapeutic ultrasound followed by iontophoresis tx as tolerated to reduce pain and swelling in L carpal tunnel region.   PERFORMANCE DEFICITS: in functional skills including IADLs, sensation, edema, strength, pain, fascial restrictions, skin integrity, and UE functional use.  IMPAIRMENTS: are limiting patient from IADLs.   COMORBIDITIES: may have co-morbidities  that affects occupational performance. Patient will benefit from skilled OT to address above impairments and improve overall function.  MODIFICATION OR ASSISTANCE TO COMPLETE EVALUATION: No modification of tasks or assist necessary to complete an evaluation.  OT OCCUPATIONAL PROFILE AND HISTORY: Problem focused assessment: Including review of records relating to presenting problem.  CLINICAL DECISION MAKING: Moderate - several treatment options, min-mod task  modification necessary  REHAB POTENTIAL: Good  EVALUATION COMPLEXITY: Moderate      PLAN:  OT FREQUENCY: 2x/week; New script signed for 3x/week for 4 weeks by Dr. Stephenie Acres on 09/25/22 to include addition of therapeutic ultrasound modality for L palm.  OT DURATION: other: 5 weeks  PLANNED INTERVENTIONS: self care/ADL training, therapeutic exercise, therapeutic activity, manual therapy, scar mobilization, iontophoresis, moist heat, cryotherapy, contrast bath, patient/family education, and DME and/or AE instructions  RECOMMENDED OTHER SERVICES: none at this time.  CONSULTED AND AGREED WITH PLAN OF CARE: Patient  PLAN FOR NEXT SESSION: iontophoresis, [place]  Otis Dials, OT 10/04/2022, 8:11 AM

## 2022-10-05 ENCOUNTER — Ambulatory Visit: Payer: Medicare Other

## 2022-10-05 DIAGNOSIS — M79642 Pain in left hand: Secondary | ICD-10-CM

## 2022-10-05 DIAGNOSIS — M6281 Muscle weakness (generalized): Secondary | ICD-10-CM

## 2022-10-05 DIAGNOSIS — Z9889 Other specified postprocedural states: Secondary | ICD-10-CM

## 2022-10-05 DIAGNOSIS — R600 Localized edema: Secondary | ICD-10-CM | POA: Diagnosis not present

## 2022-10-05 NOTE — Therapy (Unsigned)
OUTPATIENT OCCUPATIONAL THERAPY ORTHO TREATMENT NOTE  Patient Name: Julia Garner MRN: [id number] DOB:December 29, 1950, 72 y.o., female Today's Date: 10/05/2022  PCP: Dr. Malva Limes REFERRING PROVIDER: Dr. Berta Minor  END OF SESSION:   Past Medical History:  Diagnosis Date   Allergy    Chronic ITP (idiopathic thrombocytopenia) (HCC)    Chronic urticaria 08/15/2018   COVID 2023   Depression    Head injuries    History of seizure 2007   one seizure    Hypertension    Past Surgical History:  Procedure Laterality Date   CATARACT EXTRACTION, BILATERAL  around 2021   Dr. Clydene Pugh   CESAREAN SECTION  1990   CHOLECYSTECTOMY  2005   COLONOSCOPY WITH PROPOFOL N/A 12/20/2020   Procedure: COLONOSCOPY WITH PROPOFOL;  Surgeon: Midge Minium, MD;  Location: [facility] ENDOSCOPY;  Service: Endoscopy;  Laterality: N/A;   CYST EXCISION  1996   Tracheal Cyst   Patient Active Problem List   Diagnosis Date Noted   Hypocalcemia 01/29/2022   Leukocytosis 07/25/2021   COVID 07/25/2021   Uncontrolled type 2 diabetes mellitus with hyperglycemia, without long-term current use of insulin (HCC) 07/24/2021   Hypokalemia 07/24/2021   Carpal tunnel syndrome 07/20/2021   Connective tissue disease (HCC) 07/20/2021   Hyperplastic colon polyp    SVT (supraventricular tachycardia) 06/08/2020   Bilateral carpal tunnel syndrome 07/31/2019   Aortic atherosclerosis (HCC) 11/04/2018   Fatty liver 11/04/2018   Chronic urticaria 08/15/2018   Chronic ITP (idiopathic thrombocytopenia) (HCC) 08/15/2018   Recurrent infections 08/15/2018   Diabetes mellitus without complication (HCC) 09/15/2017   Other seasonal allergic rhinitis 06/29/2014   Insomnia 06/29/2014   Arthralgia of temporomandibular joint 06/29/2014   Edema 06/29/2014   Oral aphthae 07/08/2007   Cutaneous lipodystrophy 06/11/2007   Arthropathy of hand 01/25/2007   Fam hx-ischem heart disease 01/21/2007   Menopausal and postmenopausal  disorder 10/08/2005   Thrombocytopenia (HCC) 10/08/2005   Obstructive sleep apnea of adult 10/24/2004   Essential (primary) hypertension 01/16/2004   Mood disorder (HCC) 01/15/2002   ONSET DATE: 07/05/22  REFERRING DIAG: S/p L carpal tunnel release  THERAPY DIAG:  No diagnosis found.  Rationale for Evaluation and Treatment: Rehabilitation  SUBJECTIVE:  SUBJECTIVE STATEMENT: Pt states she would like to continue with ultrasound once she completes her last iontophoresis tx at the end of this week. Pt accompanied by: self  PERTINENT HISTORY: S/p carpal tunnel release on 07/05/22.  Pt remains with tenderness to the L palm and an area of swelling and likely scar tissue build up to the L lateral from incision site at base of thumb at the thenar eminence.  PRECAUTIONS: None  RED FLAGS: None   WEIGHT BEARING RESTRICTIONS: No  PAIN:  Are you having pain? Yes: NPRS scale: 0/10 at rest, with touch 1 /10 Pain location: L palm Pain description: tender, sometimes sharp Aggravating factors: holding pots/pans Relieving factors: advil on occasion; avoiding use of the hand  LIVING ENVIRONMENT: Lives with: lives with their spouse  PLOF: Independent  PATIENT GOALS: "Get rid of the pain and the swelling."   NEXT MD VISIT: ~2 weeks; 09/25/22 to Dr. Mathis Bud  OBJECTIVE:   HAND DOMINANCE: Right   ADLs: Overall ADLs: indep but with some occasional pain when using L hand to help with lifting heavy pots/pans when cooking.  FUNCTIONAL OUTCOME MEASURES: FOTO: 65; predicted 73  UPPER EXTREMITY ROM:  Bilat forearm/wrist/hand WNL   UPPER EXTREMITY MMT:   Bilat forearm/wrist WNL; see weakness in L hand noted  below.  HAND FUNCTION: Grip strength: Right: 62 lbs; Left: 41 lbs, Lateral pinch: Right: 14 lbs, Left: 10 lbs, and 3 point pinch: Right: 12 lbs, Left: 9 lbs  COORDINATION: 9 Hole Peg test: Right: 19 sec; Left: 21 sec  SENSATION: Light touch: occasional tingling L hand  digits  EDEMA: small pocket of swelling in L hand thenar eminence just laterally to the L of carpal tunnel release incision.  COGNITION: Overall cognitive status: Within functional limits for tasks assessed  OBSERVATIONS:  Pt pleasant, cooperate, eager to begin iontophoresis.    TODAY'S TREATMENT:                                                                                                                              DATE: 10/03/22 Therapeutic Ultrasound: 8 min. Tx area: L carpal tunnel, thenar eminence, and hypothenar eminence; non-thermal parameters indicated for use for delayed tissue healing with prolonged inflammation; Duty cycle: 20%, Frequency 3.3 mHz, intensity: 1.0.  Tolerated well.  Iontophoresis: tx time: 26 min.  L volar palm at the base of the L thenar eminence lateral to carpal tunnel incision, 1.6 cc dexamethasone, 40 mA/min, current amplitude 2.0 mA.    PATIENT EDUCATION: Education details: Ultrasound parameters chosen for delayed tissue healing with prolonged inflammation Person educated: Patient Education method: Explanation Education comprehension: verbalized understanding HOME EXERCISE PROGRAM: Continue deep tissue massage; ice massage, firm blue theraputty, kinesiotape for fluid mobilization at the L volar palm to mid volar forearm  GOALS: Goals reviewed with patient? Yes  SHORT TERM GOALS: Target date: 10/05/22  Pt will be indep to perform HEP for improving L hand strength for daily tasks. Baseline: Eval: HEP not yet initiated Goal status: INITIAL  LONG TERM GOALS: Target date: 10/19/2022  Pt will increase FOTO score to 36 or better to indicate improvement in self perceived functional use of the L hand with daily tasks. Baseline: Eval: 29 Goal status: INITIAL  2.  Pt will increase L grip strength by 10 or more lbs in order to improve tolerance to hold and support heavy pots and pans in the L hand. Baseline: Eval: L 41 lbs (R 62 lbs); 45 lbs Goal  status: INITIAL  3.  Pt will tolerate manual therapy, therapeutic modalities, and exercises to decrease pain and swelling in L hand to a reported 1/10 pain or less with activity.  Baseline: Eval: mild edema L thenar eminence, 2/10 pain with activity; 1/10 pain Goal status: INITIAL  ASSESSMENT: CLINICAL IMPRESSION: Pt continues to tolerate ultrasound well today to the L palm at above noted parameters. Good tolerance to iontophoresis as well.  Electrode patch removed end of session per pt request.  Pt feels the area of swelling seems to be responding well to the combination of [place] and iontophoresis.  Pt continues to perform HEP with no concerns. Will continue with therapeutic ultrasound followed by iontophoresis tx as tolerated to reduce pain and swelling in L carpal tunnel region.  PERFORMANCE DEFICITS: in functional skills including IADLs, sensation, edema, strength, pain, fascial restrictions, skin integrity, and UE functional use.  IMPAIRMENTS: are limiting patient from IADLs.   COMORBIDITIES: may have co-morbidities  that affects occupational performance. Patient will benefit from skilled OT to address above impairments and improve overall function.  MODIFICATION OR ASSISTANCE TO COMPLETE EVALUATION: No modification of tasks or assist necessary to complete an evaluation.  OT OCCUPATIONAL PROFILE AND HISTORY: Problem focused assessment: Including review of records relating to presenting problem.  CLINICAL DECISION MAKING: Moderate - several treatment options, min-mod task modification necessary  REHAB POTENTIAL: Good  EVALUATION COMPLEXITY: Moderate      PLAN:  OT FREQUENCY: 2x/week; New script signed for 3x/week for 4 weeks by Dr. Stephenie Acres on 09/25/22 to include addition of therapeutic ultrasound modality for L palm.  OT DURATION: other: 5 weeks  PLANNED INTERVENTIONS: self care/ADL training, therapeutic exercise, therapeutic activity, manual therapy, scar mobilization,  iontophoresis, moist heat, cryotherapy, contrast bath, patient/family education, and DME and/or AE instructions  RECOMMENDED OTHER SERVICES: none at this time.  CONSULTED AND AGREED WITH PLAN OF CARE: Patient  PLAN FOR NEXT SESSION: iontophoresis, [place]  Otis Dials, OT 10/05/2022, 11:11 AM

## 2022-10-08 ENCOUNTER — Ambulatory Visit: Payer: Medicare Other

## 2022-10-08 DIAGNOSIS — Z9889 Other specified postprocedural states: Secondary | ICD-10-CM

## 2022-10-08 DIAGNOSIS — M6281 Muscle weakness (generalized): Secondary | ICD-10-CM

## 2022-10-08 DIAGNOSIS — R600 Localized edema: Secondary | ICD-10-CM

## 2022-10-08 DIAGNOSIS — M79642 Pain in left hand: Secondary | ICD-10-CM | POA: Diagnosis not present

## 2022-10-08 NOTE — Therapy (Signed)
OUTPATIENT OCCUPATIONAL THERAPY ORTHO TREATMENT NOTE  Patient Name: Julia Garner MRN: [id number] DOB:28-Oct-1950, 72 y.o., female Today's Date: 10/08/2022  PCP: Dr. Malva Limes REFERRING PROVIDER: Dr. Berta Minor  END OF SESSION:  OT End of Session - 10/08/22 1356     Visit Number 11    Number of Visits 18    Date for OT Re-Evaluation 10/24/22    Progress Note Due on Visit 10    OT Start Time 1315    OT Stop Time 1345    OT Time Calculation (min) 30 min    Activity Tolerance Patient tolerated treatment well    Behavior During Therapy [facility] for tasks assessed/performed            Past Medical History:  Diagnosis Date   Allergy    Chronic ITP (idiopathic thrombocytopenia) (HCC)    Chronic urticaria 08/15/2018   COVID 2023   Depression    Head injuries    History of seizure 2007   one seizure    Hypertension    Past Surgical History:  Procedure Laterality Date   CATARACT EXTRACTION, BILATERAL  around 2021   Dr. Clydene Pugh   CESAREAN SECTION  1990   CHOLECYSTECTOMY  2005   COLONOSCOPY WITH PROPOFOL N/A 12/20/2020   Procedure: COLONOSCOPY WITH PROPOFOL;  Surgeon: Midge Minium, MD;  Location: [facility] ENDOSCOPY;  Service: Endoscopy;  Laterality: N/A;   CYST EXCISION  1996   Tracheal Cyst   Patient Active Problem List   Diagnosis Date Noted   Hypocalcemia 01/29/2022   Leukocytosis 07/25/2021   COVID 07/25/2021   Uncontrolled type 2 diabetes mellitus with hyperglycemia, without long-term current use of insulin (HCC) 07/24/2021   Hypokalemia 07/24/2021   Carpal tunnel syndrome 07/20/2021   Connective tissue disease (HCC) 07/20/2021   Hyperplastic colon polyp    SVT (supraventricular tachycardia) 06/08/2020   Bilateral carpal tunnel syndrome 07/31/2019   Aortic atherosclerosis (HCC) 11/04/2018   Fatty liver 11/04/2018   Chronic urticaria 08/15/2018   Chronic ITP (idiopathic thrombocytopenia) (HCC) 08/15/2018   Recurrent infections 08/15/2018   Diabetes  mellitus without complication (HCC) 09/15/2017   Other seasonal allergic rhinitis 06/29/2014   Insomnia 06/29/2014   Arthralgia of temporomandibular joint 06/29/2014   Edema 06/29/2014   Oral aphthae 07/08/2007   Cutaneous lipodystrophy 06/11/2007   Arthropathy of hand 01/25/2007   Fam hx-ischem heart disease 01/21/2007   Menopausal and postmenopausal disorder 10/08/2005   Thrombocytopenia (HCC) 10/08/2005   Obstructive sleep apnea of adult 10/24/2004   Essential (primary) hypertension 01/16/2004   Mood disorder (HCC) 01/15/2002   ONSET DATE: 07/05/22  REFERRING DIAG: S/p L carpal tunnel release  THERAPY DIAG:  Muscle weakness (generalized)  S/p bilateral carpal tunnel release  Hand edema  Pain in left hand  Rationale for Evaluation and Treatment: Rehabilitation  SUBJECTIVE:  SUBJECTIVE STATEMENT: Pt reports some soreness in her mouth today after having a tooth pulled last week.  Also some mild soreness in the L hypothenar eminence (typically at the thenar eminence) as pt reports she thinks she did too much with her putty today prior to OT visit.  Pt accompanied by: self  PERTINENT HISTORY: S/p carpal tunnel release on 07/05/22.  Pt remains with tenderness to the L palm and an area of swelling and likely scar tissue build up to the L lateral from incision site at base of thumb at the thenar eminence.  PRECAUTIONS: None  RED FLAGS: None   WEIGHT BEARING RESTRICTIONS: No  PAIN: 10/08/22: general soreness in  the mouth (R side) d/t tooth pulled last week.  Mild soreness L hypothenar eminence 1/10 Are you having pain? Yes: NPRS scale: 0/10 at rest, with touch 1 /10 Pain location: L palm Pain description: tender, sometimes sharp Aggravating factors: holding pots/pans Relieving factors: advil on occasion; avoiding use of the hand  LIVING ENVIRONMENT: Lives with: lives with their spouse  PLOF: Independent  PATIENT GOALS: "Get rid of the pain and the swelling."   NEXT MD  VISIT: ~2 weeks; 09/25/22 to Dr. Mathis Bud  OBJECTIVE:   HAND DOMINANCE: Right   ADLs: Overall ADLs: indep but with some occasional pain when using L hand to help with lifting heavy pots/pans when cooking.  FUNCTIONAL OUTCOME MEASURES: FOTO: 65; predicted 73 10/05/22: 65  UPPER EXTREMITY ROM:  Bilat forearm/wrist/hand WNL  UPPER EXTREMITY MMT:   Bilat forearm/wrist WNL; see weakness in L hand noted below.  HAND FUNCTION: Grip strength: Right: 62 lbs; Left: 41 lbs, Lateral pinch: Right: 14 lbs, Left: 10 lbs, and 3 point pinch: Right: 12 lbs, Left: 9 lbs 10/05/22: Grip strength: Left: 45 lbs  COORDINATION: 9 Hole Peg test: Right: 19 sec; Left: 21 sec  SENSATION: Light touch: occasional tingling L hand digits  EDEMA: small pocket of swelling in L hand thenar eminence just laterally to the L of carpal tunnel release incision.  COGNITION: Overall cognitive status: Within functional limits for tasks assessed  OBSERVATIONS:  Pt pleasant, cooperate, eager to begin iontophoresis.    TODAY'S TREATMENT:                                                                                                                              DATE: 10/08/22 Therapeutic Ultrasound: 8 min. Tx area: L carpal tunnel, thenar eminence, and hypothenar eminence; non-thermal parameters indicated for use for delayed tissue healing with prolonged inflammation; Duty cycle: 20%, Frequency 3.3 mHz, intensity: 1.0.  Tolerated well.  Manual Therapy: Use of Graston tool at the L volar palm, wrist, and forearm, working to increase circulation, reduce edema, and mobilize scar tissue to prevent adhesions.  PATIENT EDUCATION: Education details: Use of Graston tool; progress towards goals/poc moving forward Person educated: Patient Education method: Explanation Education comprehension: verbalized understanding HOME EXERCISE PROGRAM: Continue deep tissue massage; ice massage, firm blue theraputty, kinesiotape for  fluid mobilization at the L volar palm to mid volar forearm  GOALS: Goals reviewed with patient? Yes  SHORT TERM GOALS: Target date: 10/05/22  Pt will be indep to perform HEP for improving L hand strength for daily tasks. Baseline: Eval: HEP not yet initiated; 10/05/22: indep with putty and scar massage/ice massage Goal status: achieved  LONG TERM GOALS: Target date: 10/19/2022  Pt will increase FOTO score to 73 or better to indicate improvement in self perceived functional use of the L hand with daily tasks. Baseline: Eval: 65; 10/05/22: 65 Goal status: ongoing  2.  Pt will increase L grip strength by 10 or more lbs in order  to improve tolerance to hold and support heavy pots and pans in the L hand. Baseline: Eval: L 41 lbs (R 62 lbs); 45 lbs Goal status: ongoing  3.  Pt will tolerate manual therapy, therapeutic modalities, and exercises to decrease pain and swelling in L hand to a reported 1/10 pain or less with activity.  Baseline: Eval: mild edema L thenar eminence, 2/10 pain with activity; 10/05/22: 1/10 pain Goal status: ongoing  ASSESSMENT: CLINICAL IMPRESSION: Avoided grip strengthening exercises this date as pt reported that she thinks she over did it prior to coming to therapy with too much use of her theraputty, noting mild inflammation in the L hypothenar eminence.  Pt responded well to ultrasound paired with manual therapy this date.  Continuing to use non-thermal [place] parameters noted above, indicated for use for delayed tissue healing with prolonged inflammation at the L carpal tunnel, hypo thenar and thenar eminence.  Pt tolerated manual therapy with Graston tool used for increasing circulation and mobilizing scar tissue at the L volar palm, wrist, and forearm.  Remaining treatments will continue to focus on therapeutic modalities, manual therapy, and therapeutic exercises to tolerance to meet goals in OT poc.  Pt reports that she plans to leave for her extended trip out of the  country on 10/22/22, so she will plan to d/c OT at the end of next week.    PERFORMANCE DEFICITS: in functional skills including IADLs, sensation, edema, strength, pain, fascial restrictions, skin integrity, and UE functional use.  IMPAIRMENTS: are limiting patient from IADLs.   COMORBIDITIES: may have co-morbidities  that affects occupational performance. Patient will benefit from skilled OT to address above impairments and improve overall function.  MODIFICATION OR ASSISTANCE TO COMPLETE EVALUATION: No modification of tasks or assist necessary to complete an evaluation.  OT OCCUPATIONAL PROFILE AND HISTORY: Problem focused assessment: Including review of records relating to presenting problem.  CLINICAL DECISION MAKING: Moderate - several treatment options, min-mod task modification necessary  REHAB POTENTIAL: Good  EVALUATION COMPLEXITY: Moderate      PLAN:  OT FREQUENCY: 2-3x/week for 3 weeks   OT DURATION: other: 5 weeks  PLANNED INTERVENTIONS: self care/ADL training, therapeutic exercise, therapeutic activity, manual therapy, scar mobilization, iontophoresis, moist heat, cryotherapy, contrast bath, patient/family education, and DME and/or AE instructions  RECOMMENDED OTHER SERVICES: none at this time.  CONSULTED AND AGREED WITH PLAN OF CARE: Patient  PLAN FOR NEXT SESSION: [place], manual therapy, therapeutic exercises  Danelle Earthly, MS, OTR/L  Otis Dials, OT 10/08/2022, 2:00 PM

## 2022-10-10 ENCOUNTER — Ambulatory Visit: Payer: Medicare Other | Admitting: Occupational Therapy

## 2022-10-10 DIAGNOSIS — Z9889 Other specified postprocedural states: Secondary | ICD-10-CM | POA: Diagnosis not present

## 2022-10-10 DIAGNOSIS — M6281 Muscle weakness (generalized): Secondary | ICD-10-CM

## 2022-10-10 DIAGNOSIS — M79642 Pain in left hand: Secondary | ICD-10-CM | POA: Diagnosis not present

## 2022-10-10 DIAGNOSIS — R600 Localized edema: Secondary | ICD-10-CM | POA: Diagnosis not present

## 2022-10-10 NOTE — Therapy (Signed)
OUTPATIENT OCCUPATIONAL THERAPY ORTHO TREATMENT NOTE  Patient Name: Julia Garner MRN: [id number] DOB:1950-09-03, 72 y.o., female Today's Date: 10/10/2022  PCP: Dr. Malva Limes REFERRING PROVIDER: Dr. Berta Minor  END OF SESSION:  OT End of Session - 10/10/22 1557     Visit Number 12    Number of Visits 18    Date for OT Re-Evaluation 10/24/22    OT Start Time 1318    OT Stop Time 1350    OT Time Calculation (min) 32 min    Activity Tolerance Patient tolerated treatment well    Behavior During Therapy [facility] for tasks assessed/performed            Past Medical History:  Diagnosis Date   Allergy    Chronic ITP (idiopathic thrombocytopenia) (HCC)    Chronic urticaria 08/15/2018   COVID 2023   Depression    Head injuries    History of seizure 2007   one seizure    Hypertension    Past Surgical History:  Procedure Laterality Date   CATARACT EXTRACTION, BILATERAL  around 2021   Dr. Clydene Pugh   CESAREAN SECTION  1990   CHOLECYSTECTOMY  2005   COLONOSCOPY WITH PROPOFOL N/A 12/20/2020   Procedure: COLONOSCOPY WITH PROPOFOL;  Surgeon: Midge Minium, MD;  Location: [facility] ENDOSCOPY;  Service: Endoscopy;  Laterality: N/A;   CYST EXCISION  1996   Tracheal Cyst   Patient Active Problem List   Diagnosis Date Noted   Hypocalcemia 01/29/2022   Leukocytosis 07/25/2021   COVID 07/25/2021   Uncontrolled type 2 diabetes mellitus with hyperglycemia, without long-term current use of insulin (HCC) 07/24/2021   Hypokalemia 07/24/2021   Carpal tunnel syndrome 07/20/2021   Connective tissue disease (HCC) 07/20/2021   Hyperplastic colon polyp    SVT (supraventricular tachycardia) 06/08/2020   Bilateral carpal tunnel syndrome 07/31/2019   Aortic atherosclerosis (HCC) 11/04/2018   Fatty liver 11/04/2018   Chronic urticaria 08/15/2018   Chronic ITP (idiopathic thrombocytopenia) (HCC) 08/15/2018   Recurrent infections 08/15/2018   Diabetes mellitus without complication  (HCC) 09/15/2017   Other seasonal allergic rhinitis 06/29/2014   Insomnia 06/29/2014   Arthralgia of temporomandibular joint 06/29/2014   Edema 06/29/2014   Oral aphthae 07/08/2007   Cutaneous lipodystrophy 06/11/2007   Arthropathy of hand 01/25/2007   Fam hx-ischem heart disease 01/21/2007   Menopausal and postmenopausal disorder 10/08/2005   Thrombocytopenia (HCC) 10/08/2005   Obstructive sleep apnea of adult 10/24/2004   Essential (primary) hypertension 01/16/2004   Mood disorder (HCC) 01/15/2002   ONSET DATE: 07/05/22  REFERRING DIAG: S/p L carpal tunnel release  THERAPY DIAG:  Muscle weakness (generalized)  S/p bilateral carpal tunnel release  Rationale for Evaluation and Treatment: Rehabilitation  SUBJECTIVE:  SUBJECTIVE STATEMENT: Mild soreness at the thenar eminence.  Pt accompanied by: self  PERTINENT HISTORY: S/p carpal tunnel release on 07/05/22.  Pt remains with tenderness to the L palm and an area of swelling and likely scar tissue build up to the L lateral from incision site at base of thumb at the thenar eminence.  PRECAUTIONS: None  RED FLAGS: None   WEIGHT BEARING RESTRICTIONS: No  PAIN: 10/08/22: Mild tenderness thenar eminence near 1/10 Are you having pain? Yes: NPRS scale: 1/10 Pain location: L palm Pain description: tender Aggravating factors: holding pots/pans Relieving factors: advil on occasion; avoiding use of the hand  LIVING ENVIRONMENT: Lives with: lives with their spouse  PLOF: Independent  PATIENT GOALS: "Get rid of the pain and the swelling."   NEXT  MD VISIT: ~2 weeks; 09/25/22 to Dr. Mathis Bud  OBJECTIVE:   HAND DOMINANCE: Right   ADLs: Overall ADLs: indep but with some occasional pain when using L hand to help with lifting heavy pots/pans when cooking.  FUNCTIONAL OUTCOME MEASURES: FOTO: 65; predicted 73 10/05/22: 65  UPPER EXTREMITY ROM:  Bilat forearm/wrist/hand WNL  UPPER EXTREMITY MMT:   Bilat forearm/wrist  WNL; see weakness in L hand noted below.  HAND FUNCTION: Grip strength: Right: 62 lbs; Left: 41 lbs, Lateral pinch: Right: 14 lbs, Left: 10 lbs, and 3 point pinch: Right: 12 lbs, Left: 9 lbs 10/05/22: Grip strength: Left: 45 lbs  COORDINATION: 9 Hole Peg test: Right: 19 sec; Left: 21 sec  SENSATION: Light touch: occasional tingling L hand digits  EDEMA: small pocket of swelling in L hand thenar eminence just laterally to the L of carpal tunnel release incision.  COGNITION: Overall cognitive status: Within functional limits for tasks assessed  OBSERVATIONS:  Pt pleasant, cooperate, eager to begin iontophoresis.    TODAY'S TREATMENT:                                                                                                                              DATE: 10/10/22 Therapeutic Ultrasound: 8 min. Tx area: L carpal tunnel, thenar eminence, and hypothenar eminence; non-thermal parameters indicated for use for delayed tissue healing with prolonged inflammation; Duty cycle: 20%, Frequency 3.3 mHz, intensity: 1.0.  Tolerated well.  Manual Therapy: Use of Graston tool at the L volar palm, wrist, and forearm, working to increase circulation, reduce edema, and mobilize scar tissue to prevent adhesions.  PATIENT EDUCATION: Education details: Use of Graston tool; progress towards goals/poc moving forward Person educated: Patient Education method: Explanation Education comprehension: verbalized understanding HOME EXERCISE PROGRAM: Continue deep tissue massage; ice massage, firm blue theraputty, kinesiotape for fluid mobilization at the L volar palm to mid volar forearm  GOALS: Goals reviewed with patient? Yes  SHORT TERM GOALS: Target date: 10/05/22  Pt will be indep to perform HEP for improving L hand strength for daily tasks. Baseline: Eval: HEP not yet initiated; 10/05/22: indep with putty and scar massage/ice massage Goal status: achieved  LONG TERM GOALS: Target date:  10/19/2022  Pt will increase FOTO score to 73 or better to indicate improvement in self perceived functional use of the L hand with daily tasks. Baseline: Eval: 65; 10/05/22: 65 Goal status: ongoing  2.  Pt will increase L grip strength by 10 or more lbs in order to improve tolerance to hold and support heavy pots and pans in the L hand. Baseline: Eval: L 41 lbs (R 62 lbs); 45 lbs Goal status: ongoing  3.  Pt will tolerate manual therapy, therapeutic modalities, and exercises to decrease pain and swelling in L hand to a reported 1/10 pain or less with activity.  Baseline: Eval: mild edema L thenar eminence, 2/10 pain with activity; 10/05/22: 1/10 pain Goal status: ongoing  ASSESSMENT: CLINICAL IMPRESSION:  Pt. reports that she is planning to talk with her physician about having an MRI done prior to her trip. Pt responded well to ultrasound paired with manual therapy this date.  Continuing to use non-thermal [place] parameters noted above, indicated for use for delayed tissue healing with prolonged inflammation at the L carpal tunnel, hypo thenar and thenar eminence.  Pt. Continues to tolerate manual therapy with Graston tool used for increasing circulation and mobilizing scar tissue at the L volar palm, wrist, and forearm.  Remaining treatments will continue to focus on therapeutic modalities, manual therapy, and therapeutic exercises to tolerance to meet goals in OT poc.  Pt continues to plan to leave for her extended trip out of the country on 10/22/22. Will continue to plan to d/c OT at the end of next week.    PERFORMANCE DEFICITS: in functional skills including IADLs, sensation, edema, strength, pain, fascial restrictions, skin integrity, and UE functional use.  IMPAIRMENTS: are limiting patient from IADLs.   COMORBIDITIES: may have co-morbidities  that affects occupational performance. Patient will benefit from skilled OT to address above impairments and improve overall function.  MODIFICATION  OR ASSISTANCE TO COMPLETE EVALUATION: No modification of tasks or assist necessary to complete an evaluation.  OT OCCUPATIONAL PROFILE AND HISTORY: Problem focused assessment: Including review of records relating to presenting problem.  CLINICAL DECISION MAKING: Moderate - several treatment options, min-mod task modification necessary  REHAB POTENTIAL: Good  EVALUATION COMPLEXITY: Moderate      PLAN:  OT FREQUENCY: 2-3x/week for 3 weeks   OT DURATION: other: 5 weeks  PLANNED INTERVENTIONS: self care/ADL training, therapeutic exercise, therapeutic activity, manual therapy, scar mobilization, iontophoresis, moist heat, cryotherapy, contrast bath, patient/family education, and DME and/or AE instructions  RECOMMENDED OTHER SERVICES: none at this time.  CONSULTED AND AGREED WITH PLAN OF CARE: Patient  PLAN FOR NEXT SESSION: [place], manual therapy, therapeutic exercises  Olegario Messier, MS, OTR/L 10/10/2022, 4:02 PM

## 2022-10-11 DIAGNOSIS — R2232 Localized swelling, mass and lump, left upper limb: Secondary | ICD-10-CM | POA: Diagnosis not present

## 2022-10-12 ENCOUNTER — Ambulatory Visit: Payer: Medicare Other | Admitting: Occupational Therapy

## 2022-10-12 DIAGNOSIS — R2232 Localized swelling, mass and lump, left upper limb: Secondary | ICD-10-CM | POA: Diagnosis not present

## 2022-10-15 ENCOUNTER — Ambulatory Visit: Payer: Medicare Other

## 2022-10-17 ENCOUNTER — Ambulatory Visit: Payer: Medicare Other

## 2022-10-19 ENCOUNTER — Ambulatory Visit: Payer: Medicare Other

## 2022-10-22 ENCOUNTER — Ambulatory Visit: Payer: Medicare Other

## 2022-10-24 ENCOUNTER — Ambulatory Visit: Payer: Medicare Other

## 2022-10-26 ENCOUNTER — Ambulatory Visit: Payer: Medicare Other

## 2022-10-30 ENCOUNTER — Ambulatory Visit: Payer: Medicare Other

## 2022-11-01 ENCOUNTER — Ambulatory Visit: Payer: Medicare Other

## 2022-11-24 ENCOUNTER — Other Ambulatory Visit: Payer: Self-pay | Admitting: Family Medicine

## 2022-11-26 NOTE — Telephone Encounter (Signed)
Requested medication (s) are due for refill today: routing for review  Requested medication (s) are on the active medication list: yes  Last refill:  09/28/22  Future visit scheduled: yes  Notes to clinic:  OV note states dose decrease to 20 mg, routing for approval.     Requested Prescriptions  Pending Prescriptions Disp Refills   simvastatin (ZOCOR) 40 MG tablet [Pharmacy Med Name: Simvastatin 40 MG Oral Tablet] 100 tablet 2    Sig: TAKE 1 TABLET BY MOUTH AT  BEDTIME     Cardiovascular:  Antilipid - Statins Failed - 11/24/2022 10:21 PM      Failed - Lipid Panel in normal range within the last 12 months    Cholesterol, Total  Date Value Ref Range Status  04/17/2022 129 100 - 199 mg/dL Final   LDL Chol Calc (NIH)  Date Value Ref Range Status  04/17/2022 59 0 - 99 mg/dL Final   HDL  Date Value Ref Range Status  04/17/2022 33 (L) >39 mg/dL Final   Triglycerides  Date Value Ref Range Status  04/17/2022 227 (H) 0 - 149 mg/dL Final         Passed - Patient is not pregnant      Passed - Valid encounter within last 12 months    Recent Outpatient Visits           1 month ago Other seasonal allergic rhinitis   [place] Malva Limes, MD   6 months ago Other seasonal allergic rhinitis   [place] [facility] Malva Limes, MD   7 months ago Diabetes mellitus without complication [facility])   [facility] Malva Limes, MD   10 months ago Diabetes mellitus without complication [facility])   [place] [facility] Malva Limes, MD   1 year ago Sebaceous cyst   [facility] Malva Limes, MD       Future Appointments             In 1 month Fisher, Demetrios Isaacs, MD [facility], [facility]   In 3 months Dellis Anes, Hetty Ely, MD [facility]

## 2022-12-05 ENCOUNTER — Other Ambulatory Visit: Payer: Self-pay | Admitting: Family Medicine

## 2022-12-06 NOTE — Telephone Encounter (Signed)
Refused pt has upcoming appt- reordered 10/01/22 #90 1 RF  Requested Prescriptions  Refused Prescriptions Disp Refills   irbesartan (AVAPRO) 75 MG tablet [Pharmacy Med Name: IRBESARTAN  75MG   TAB] 100 tablet 2    Sig: TAKE 1 TABLET BY MOUTH AT  BEDTIME     Cardiovascular:  Angiotensin Receptor Blockers Failed - 12/05/2022 10:42 PM      Failed - Last BP in normal range    BP Readings from Last 1 Encounters:  09/28/22 (!) 140/67         Passed - Cr in normal range and within 180 days    Creatinine, Ser  Date Value Ref Range Status  07/30/2022 0.73 0.44 - 1.00 mg/dL Final         Passed - K in normal range and within 180 days    Potassium  Date Value Ref Range Status  07/30/2022 3.7 3.5 - 5.1 mmol/L Final         Passed - Patient is not pregnant      Passed - Valid encounter within last 6 months    Recent Outpatient Visits           2 months ago Other seasonal allergic rhinitis   [place] Malva Limes, MD   6 months ago Other seasonal allergic rhinitis   [place] [facility] Malva Limes, MD   7 months ago Diabetes mellitus without complication [facility])   [place] [facility] Malva Limes, MD   11 months ago Diabetes mellitus without complication [facility])   [place] [facility] Malva Limes, MD   1 year ago Sebaceous cyst   [place] [facility] Malva Limes, MD       Future Appointments             In 3 weeks Fisher, Demetrios Isaacs, MD [facility], [facility]   In 3 months Dellis Anes, Hetty Ely, MD [facility] [place] at [facility]

## 2022-12-18 ENCOUNTER — Ambulatory Visit: Payer: Self-pay | Admitting: *Deleted

## 2022-12-18 NOTE — Telephone Encounter (Signed)
Summary: medication request   Patient called stated she has a nagging cough and the OTC medication is not working. She only wants to see Dr Sherrie Mustache who has no openings until Friday. If provider can work her in or call in a medication for her symptoms          Called patient [phone] to review sx of "nagging cough". No answer, LVMTCB [phone].

## 2022-12-18 NOTE — Telephone Encounter (Signed)
  Chief Complaint: requesting cough medication from Dr. Sherrie Mustache  Symptoms: nagging cough per agent notes no contact with NT and patient  Frequency: na Pertinent Negatives: Patient denies na Disposition: [] ED /[] Urgent Care (no appt availability in office) / [] Appointment(In office/virtual)/ []  [place] [facility]/ [] Home Care/ [] Refused Recommended Disposition /[] [place] [facility]/ [x]  Follow-up with PCP Additional Notes:   No contact with patient. 3 calls attempted to review cough . OTC medication not working per agent note. Requesting cough medication . No available appt until Friday . Please advise.   Summary: medication request   Patient called stated she has a nagging cough and the OTC medication is not working. She only wants to see Dr Sherrie Mustache who has no openings until Friday. If provider can work her in or call in a medication for her symptoms           Reason for Disposition  Third attempt to contact caller AND no contact made. Phone number verified.  Answer Assessment - Initial Assessment Questions N/A Requesting medication for cough  Protocols used: No Contact or Duplicate Contact Call-A-AH

## 2022-12-18 NOTE — Telephone Encounter (Signed)
2nd attempt to contact patient to review sx and medication requested. No answer, LVMTCB [phone].

## 2022-12-19 NOTE — Telephone Encounter (Signed)
Needs appointment

## 2022-12-21 ENCOUNTER — Ambulatory Visit
Admission: RE | Admit: 2022-12-21 | Discharge: 2022-12-21 | Disposition: A | Discharge status: Home / Self Care | Payer: Medicare Other | Source: Ambulatory Visit | Attending: Family Medicine | Admitting: Family Medicine

## 2022-12-21 ENCOUNTER — Ambulatory Visit: Payer: Medicare Other | Admitting: Family Medicine

## 2022-12-21 ENCOUNTER — Encounter: Payer: Self-pay | Admitting: Family Medicine

## 2022-12-21 VITALS — BP 107/72 | HR 101 | Ht 61.0 in | Wt 148.0 lb

## 2022-12-21 DIAGNOSIS — R051 Acute cough: Secondary | ICD-10-CM

## 2022-12-21 DIAGNOSIS — R059 Cough, unspecified: Secondary | ICD-10-CM | POA: Diagnosis not present

## 2022-12-21 DIAGNOSIS — R0602 Shortness of breath: Secondary | ICD-10-CM | POA: Diagnosis not present

## 2022-12-21 LAB — POCT INFLUENZA A/B
Influenza A, POC: NEGATIVE
Influenza B, POC: NEGATIVE

## 2022-12-21 LAB — POC COVID19 BINAXNOW: SARS Coronavirus 2 Ag: NEGATIVE

## 2022-12-21 MED ORDER — ALBUTEROL SULFATE HFA 108 (90 BASE) MCG/ACT IN AERS: 1.0000 | INHALATION_SPRAY | Freq: Four times a day (QID) | RESPIRATORY_TRACT | 2 refills | Status: AC | PRN

## 2022-12-21 MED ORDER — DOXYCYCLINE HYCLATE 100 MG PO TABS
100.0000 mg | ORAL_TABLET | Freq: Two times a day (BID) | ORAL | 0 refills | Status: AC
Start: 2022-12-21 — End: 2022-12-28

## 2022-12-21 NOTE — Patient Instructions (Signed)
Please review the attached list of medications and notify my office if there are any errors.   Go to [facility] for your chest X-rays (phone no. [phone])

## 2022-12-25 ENCOUNTER — Telehealth: Payer: Self-pay | Admitting: Family Medicine

## 2022-12-25 NOTE — Telephone Encounter (Signed)
Patient is calling in requesting to speak with a nurse in office. Pt says that she went to pick up her Ozempic and [employer] told her she had to pay for it. Pt says she called her insurance and she was in the hole and she wants to speak with a nurse regarding this matter. Please follow up with pt.

## 2022-12-25 NOTE — Progress Notes (Signed)
Established patient visit   Patient: Julia Garner   DOB: February 12, 1950   72 y.o. Female  MRN: [id number] Visit Date: 12/21/2022  Today's healthcare provider: Mila Merry, MD   Chief Complaint  Patient presents with   Cough   Subjective    Discussed the use of AI scribe software for clinical note transcription with the patient, who gave verbal consent to proceed.  History of Present Illness   The patient, with a history of recent international travel, presents with a persistent cough and chest discomfort for approximately seven to eight days. The cough is non-productive, and the patient denies fever or sore throat. The chest discomfort is localized and worsens when lying on one side. She had been visiting family in [place] and symptoms started day after arriving back in the [place]   The patient attempted self-treatment with a decongestant, prednisone, and a Z-Pak, none of which improved symptoms. She then started on Levofloxacin, which she has been taking for a few days without noticeable improvement. The patient has received both a flu shot and a pneumonia vaccine prior to the onset of symptoms. She also received a COVID-19 booster shot before her recent travel to [place].  Despite these vaccinations, the patient's symptoms persist, and she expresses concern about her lack of improvement despite antibiotic treatment.       Medications: Outpatient Medications Prior to Visit  Medication Sig   ascorbic acid (VITAMIN C) 500 MG tablet Take 500 mg by mouth daily.   Blood Glucose Monitoring Suppl (ONE TOUCH ULTRA 2) w/Device KIT Use to check sugar daily for type 2 diabetes E11.9   Calcium Carbonate (CALCIUM 500 PO) Take 1 tablet by mouth daily.   clobetasol ointment (TEMOVATE) 0.05 % Apply 1 Application topically 2 (two) times daily. Try to use for only two weeks at a time.   fexofenadine-pseudoephedrine (ALLEGRA-D ALLERGY & CONGESTION) 180-240 MG 24 hr tablet Take 1 tablet by  mouth daily.   fluticasone (FLONASE) 50 MCG/ACT nasal spray Place 2 sprays into both nostrils daily.   glucose blood (ONETOUCH ULTRA) test strip Use to check sugar twice daily for type 2 diabetes E11.9   irbesartan (AVAPRO) 75 MG tablet TAKE 1 TABLET BY MOUTH AT  BEDTIME   Lancets (ONETOUCH ULTRASOFT) lancets Use to check sugar twice daily for type 2 diabetes E11.9   levofloxacin (LEVAQUIN) 750 MG tablet Take 750 mg by mouth daily.   magnesium gluconate (MAGONATE) 500 MG tablet Take 500 mg by mouth daily.   Multiple Vitamin (MULTIVITAMIN) tablet Take 1 tablet by mouth daily.   Naproxen Sodium 220 MG CAPS Take by mouth daily as needed.   omalizumab Geoffry Paradise) 150 MG/ML prefilled syringe Inject 300 mg into the skin every 14 (fourteen) days.   Omega 3 1200 MG CAPS Take 2 capsules by mouth daily.   predniSONE (DELTASONE) 10 MG tablet Take 3 tabs (30mg ) twice daily for 3 days, then 2 tabs (20mg ) twice daily for 3 days, then 1 tab (10mg ) twice daily for 3 days, then STOP.   Semaglutide,0.25 or 0.5MG /DOS, 2 MG/3ML SOPN 0.25mg  once a week for 4 weeks then increase to 0.5mg    sertraline (ZOLOFT) 50 MG tablet TAKE 2 TABLETS BY MOUTH DAILY   simvastatin (ZOCOR) 20 MG tablet Take 1 tablet (20 mg total) by mouth at bedtime.   traZODone (DESYREL) 50 MG tablet TAKE 1 TO 2 TABLETS BY MOUTH AT  BEDTIME   Ubiquinol 200 MG CAPS Take by mouth. CoQ10  Facility-Administered Medications Prior to Visit  Medication Dose Route Frequency Provider   omalizumab Geoffry Paradise) injection 300 mg  300 mg Subcutaneous Q28 days Alfonse Spruce, MD   Review of Systems     Objective    BP 107/72   Pulse (!) 101   Ht 5\' 1"  (1.549 m)   Wt 148 lb (67.1 kg)   SpO2 97%   BMI 27.96 kg/m   Physical Exam   General: Appearance:    Well developed, well nourished female in no acute distress  Eyes:    PERRL, conjunctiva/corneas clear, EOM's intact       Lungs:     Clear to auscultation bilaterally, respirations unlabored   Heart:    Tachycardic. Normal rhythm. No murmurs, rubs, or gallops.    MS:   All extremities are intact.    Neurologic:   Awake, alert, oriented x 3. No apparent focal neurological defect.           Results for orders placed or performed in visit on 12/21/22  POC COVID-19  Result Value Ref Range   SARS Coronavirus 2 Ag Negative Negative  POCT Influenza A/B  Result Value Ref Range   Influenza A, POC Negative Negative   Influenza B, POC Negative Negative    Assessment & Plan        Respiratory Infection Persistent cough and discomfort for approximately 7-8 days. No improvement with prednisone and azithromycin (Z-Pak). Currently on levofloxacin. No fever or sore throat reported. Recent travel to [place]. Negative for flu and COVID-19. -Order chest x-ray to evaluate for possible pneumonia. -Continue levofloxacin as prescribed.  Urinary Incontinence Reports of cough-induced urinary incontinence. -No specific plan discussed in the conversation.    No follow-ups on file.      Mila Merry, MD  [facility] [phone] (phone) [phone] (fax)  [facility]

## 2022-12-26 NOTE — Telephone Encounter (Signed)
Patient called, left VM to return the call to the office. If she returns the call, please relay the information below and obtain additional information if supplied.

## 2022-12-31 ENCOUNTER — Telehealth: Payer: Self-pay

## 2022-12-31 ENCOUNTER — Ambulatory Visit: Payer: Medicare Other | Admitting: Family Medicine

## 2022-12-31 NOTE — Telephone Encounter (Signed)
Copied from CRM [id number]. Topic: Appointment Scheduling - Scheduling Inquiry for Clinic >> Dec 31, 2022  9:03 AM Franchot Heidelberg wrote: Reason for CRM: Pt called requesting to be seen for lab work, she says that her PCP wants to see her for A1C check per her recent visit.

## 2023-01-01 ENCOUNTER — Telehealth: Payer: Self-pay

## 2023-01-01 NOTE — Telephone Encounter (Signed)
Copied from CRM [id number]. Topic: General - Other >> Jan 01, 2023 10:08 AM Everette C wrote: Reason for CRM: The patient has called to request contact with a member of clinical staff to further discuss coordination of future refills of their Ozempic prescription   Please contact the patient further when available

## 2023-01-01 NOTE — Telephone Encounter (Signed)
I have forwarded a note that the PEC took previously on this patient.  I have not had contact with her and don't know her.  The PEC is sending messages to my attention when these need to go to Fisher nurse   Copied from CRM [id number]. Topic: General - Other >> Jan 01, 2023  1:19 PM Phill Myron wrote: 2nd Call  from pt Julia Garner   The patient has called to request contact with a member of clinical staff to further discuss coordination of future refills of her Ozempic prescription  It is a lot cheaper by mail order. Please advise Also, if she does not answer please call husband [phone])

## 2023-01-01 NOTE — Telephone Encounter (Signed)
Left vm for pt to call back to schedule ov per Dr. Sherrie Mustache.  Okay for PEC to schedule appt

## 2023-01-01 NOTE — Telephone Encounter (Signed)
That is correct, she is due for o.v. for follow up prediabetes and to check her A1c.

## 2023-01-04 NOTE — Telephone Encounter (Signed)
This is third encounter for same concern. Closing this encoutner.

## 2023-01-04 NOTE — Telephone Encounter (Signed)
Appointment is scheduled.

## 2023-01-04 NOTE — Telephone Encounter (Signed)
Patient called back thru [facility] and asked to speak to clinical and I confirmed that patient as appointment scheduled and coming in Jan 2025 to discuss medication so that provider can advise her medications. Patient also stated that she changed her insurance to [employer] now so hopefully that can approve ozempic

## 2023-01-04 NOTE — Telephone Encounter (Signed)
This is the fourth encounter for same concern. Closing this encounter

## 2023-01-18 ENCOUNTER — Ambulatory Visit: Payer: Medicare Other | Admitting: Family Medicine

## 2023-01-18 VITALS — BP 126/82 | HR 86 | Resp 18 | Ht 61.0 in | Wt 149.0 lb

## 2023-01-18 DIAGNOSIS — E1169 Type 2 diabetes mellitus with other specified complication: Secondary | ICD-10-CM

## 2023-01-18 DIAGNOSIS — D693 Immune thrombocytopenic purpura: Secondary | ICD-10-CM

## 2023-01-18 DIAGNOSIS — G8929 Other chronic pain: Secondary | ICD-10-CM

## 2023-01-18 DIAGNOSIS — J302 Other seasonal allergic rhinitis: Secondary | ICD-10-CM

## 2023-01-18 DIAGNOSIS — R519 Headache, unspecified: Secondary | ICD-10-CM

## 2023-01-18 DIAGNOSIS — E119 Type 2 diabetes mellitus without complications: Secondary | ICD-10-CM

## 2023-01-18 DIAGNOSIS — K760 Fatty (change of) liver, not elsewhere classified: Secondary | ICD-10-CM

## 2023-01-18 DIAGNOSIS — Z7985 Long-term (current) use of injectable non-insulin antidiabetic drugs: Secondary | ICD-10-CM | POA: Diagnosis not present

## 2023-01-18 LAB — POCT GLYCOSYLATED HEMOGLOBIN (HGB A1C): Hemoglobin A1C: 5.8 % — AB (ref 4.0–5.6)

## 2023-01-18 MED ORDER — FEXOFENADINE-PSEUDOEPHED ER 180-240 MG PO TB24
1.0000 | ORAL_TABLET | Freq: Every day | ORAL | 3 refills | Status: DC
Start: 2023-01-18 — End: 2023-01-21

## 2023-01-18 NOTE — Patient Instructions (Signed)
 Marland Kitchen  Please review the attached list of medications and notify my office if there are any errors.   . Please bring all of your medications to every appointment so we can make sure that our medication list is the same as yours.

## 2023-01-21 ENCOUNTER — Other Ambulatory Visit: Payer: Self-pay

## 2023-01-21 ENCOUNTER — Telehealth: Payer: Self-pay | Admitting: *Deleted

## 2023-01-21 ENCOUNTER — Other Ambulatory Visit: Payer: Self-pay | Admitting: Family Medicine

## 2023-01-21 ENCOUNTER — Other Ambulatory Visit (HOSPITAL_COMMUNITY): Payer: Self-pay

## 2023-01-21 DIAGNOSIS — J302 Other seasonal allergic rhinitis: Secondary | ICD-10-CM

## 2023-01-21 LAB — HM DIABETES EYE EXAM

## 2023-01-21 MED ORDER — FEXOFENADINE-PSEUDOEPHED ER 180-240 MG PO TB24
1.0000 | ORAL_TABLET | Freq: Every day | ORAL | 3 refills | Status: DC
Start: 2023-01-21 — End: 2023-07-15

## 2023-01-21 MED ORDER — XOLAIR 300 MG/2ML ~~LOC~~ SOSY
300.0000 mg | PREFILLED_SYRINGE | SUBCUTANEOUS | 11 refills | Status: DC
  Filled 2023-01-21 (×2): qty 2, 28d supply, fill #0

## 2023-01-21 NOTE — Telephone Encounter (Signed)
 Patient request Rx transfer to local pharmacy- per request- current Rx- sent as requested. \ Requested Prescriptions  Pending Prescriptions Disp Refills   fexofenadine -pseudoephedrine  (ALLEGRA-D ALLERGY  & CONGESTION) 180-240 MG 24 hr tablet 90 tablet 3    Sig: Take 1 tablet by mouth daily.     Not Delegated - Ear, Nose, and Throat:  Combination Products with Pseudoephedrine  Failed - 01/21/2023 11:19 AM      Failed - This refill cannot be delegated      Passed - Last BP in normal range    BP Readings from Last 1 Encounters:  01/18/23 126/82         Passed - Valid encounter within last 12 months    Recent Outpatient Visits           3 days ago Diabetes mellitus without complication [facility])   [place] [facility] Gasper Nancyann BRAVO, MD   1 month ago Acute cough   [place] [facility] Gasper Nancyann BRAVO, MD   3 months ago Other seasonal allergic rhinitis   [place] [facility] Gasper Nancyann BRAVO, MD   8 months ago Other seasonal allergic rhinitis   [place] [facility] Gasper Nancyann BRAVO, MD   9 months ago Diabetes mellitus without complication [facility])   [place] [facility] Gasper Nancyann BRAVO, MD       Future Appointments             In 1 month Iva, Marty Saltness, MD [facility] [place]   In 5 months Fisher, Nancyann BRAVO, MD [facility]

## 2023-01-21 NOTE — Progress Notes (Signed)
 Specialty Pharmacy Initiation Note   Julia Garner is a 73 y.o. female who will be followed by the specialty pharmacy service for RxSp Allergy     Review of administration, indication, effectiveness, safety, potential side effects, storage/disposable, and missed dose instructions occurred today for patient's specialty medication(s) Omalizumab  (Xolair )     Patient/Caregiver did not have any additional questions or concerns.   Patient's therapy is appropriate to: Initiate  Patient has been on Xolair  for 2 years. Patient is new to [facility]. Her next dose is due on Wed, 01/30/23. Patient states that Xolair  works well for her eczema and will occasionally use Triamcinolone  cream.     Goals Addressed             This Visit's Progress    Minimize recurrence of flares       Patient is on track. Patient will maintain adherence         [name] Specialty Pharmacist

## 2023-01-21 NOTE — Telephone Encounter (Signed)
 Patient advised rx sent

## 2023-01-21 NOTE — Telephone Encounter (Signed)
 Medication Refill -  Most Recent Primary Care Visit:  Provider: GASPER NANCYANN BRAVO  Department: [facility]  Visit Type: OFFICE VISIT  Date: 01/18/2023  Medication: fexofenadine -pseudoephedrine  (ALLEGRA-D ALLERGY  & CONGESTION) 180-240 MG 24 hr tablet   Has the patient contacted their pharmacy? Yes  (Agent: If yes, when and what did the pharmacy advise?) Patient states medication is to expensive through the mail order and would like PCP to send in Rx to [facility] today because she is going out of town. Patient would like a follow up call today.   Is this the correct pharmacy for this prescription? Yes  This is the patient's preferred pharmacy:  [facility] [address], [place] - [address] [place] [address] Phone: [phone] Fax: [phone]   Has the prescription been filled recently? Yes  Is the patient out of the medication? Yes  Has the patient been seen for an appointment in the last year OR does the patient have an upcoming appointment? Yes  Can we respond through MyChart? No  Agent: Please be advised that Rx refills may take up to 3 business days. We ask that you follow-up with your pharmacy.

## 2023-01-21 NOTE — Progress Notes (Signed)
 Established patient visit   Patient: Julia Garner   DOB: November 22, 1950   73 y.o. Female  MRN: [id number] Visit Date: 01/18/2023  Today's healthcare provider: Nancyann Perry, MD   Chief Complaint  Patient presents with   Diabetes   Hypertension   Subjective    Discussed the use of AI scribe software for clinical note transcription with the patient, who gave verbal consent to proceed.  History of Present Illness   The patient presents for follow diabetes, hypertension, and atherosclerosis, presents for a routine follow-up and medication review. She is currently on Ozempic  0.5mg , which she reports causes constipation, managed with over-the-counter remedies. She requests a prescription for Ozempic  to be sent to her pharmacy for March, as she has two refills remaining at her current pharmacy.  She also has chronic sinus pressure relieved only by Claritin -D or Allegra-D. She requests a three-month supply of either one of those to be sent to her pharmacy, as she currently has to visit the drugstore every two weeks for refills. Despite taking Allegra D, she occasionally feels pressure behind her left eye and has to take an additional plain Allegra in the morning.   She also has a diagnosis of thrombocytopenia, fatty liver, and aortic atherosclerosis seen on previous imaging studies.   She also reports an ongoing problem with headaches, for which she had an MRI of the brain that was normal. She is considering seeing a new neurologist for this issue.     Last metabolic panel Lab Results  Component Value Date   GLUCOSE 101 (H) 07/30/2022   NA 138 07/30/2022   K 3.7 07/30/2022   CL 104 07/30/2022   CO2 26 07/30/2022   BUN 10 07/30/2022   CREATININE 0.73 07/30/2022   GFRNONAA >60 07/30/2022   CALCIUM 8.7 (L) 07/30/2022   PHOS 5.3 (H) 07/26/2021   PROT 7.8 07/30/2022   ALBUMIN 3.9 07/30/2022   LABGLOB 3.4 08/08/2021   AGRATIO 1.1 (L) 08/08/2021   BILITOT 0.4 07/30/2022   ALKPHOS  52 07/30/2022   AST 23 07/30/2022   ALT 23 07/30/2022   ANIONGAP 8 07/30/2022   Lab Results  Component Value Date   WBC 7.7 07/30/2022   HGB 13.7 07/30/2022   HCT 41.4 07/30/2022   MCV 87.9 07/30/2022   PLT 148 (L) 07/30/2022   Lab Results  Component Value Date   CHOL 129 04/17/2022   HDL 33 (L) 04/17/2022   LDLCALC 59 04/17/2022   TRIG 227 (H) 04/17/2022   CHOLHDL 3.9 04/17/2022     Medications: Outpatient Medications Prior to Visit  Medication Sig   albuterol  (VENTOLIN  HFA) 108 (90 Base) MCG/ACT inhaler Inhale 1-2 puffs into the lungs every 6 (six) hours as needed for shortness of breath.   ascorbic acid  (VITAMIN C) 500 MG tablet Take 500 mg by mouth daily.   Blood Glucose Monitoring Suppl (ONE TOUCH ULTRA 2) w/Device KIT Use to check sugar daily for type 2 diabetes E11.9   Calcium Carbonate (CALCIUM 500 PO) Take 1 tablet by mouth daily.   clobetasol  ointment (TEMOVATE ) 0.05 % Apply 1 Application topically 2 (two) times daily. Try to use for only two weeks at a time.   ELDERBERRY PO Take by mouth.   fluticasone  (FLONASE ) 50 MCG/ACT nasal spray Place 2 sprays into both nostrils daily.   glucose blood (ONETOUCH ULTRA) test strip Use to check sugar twice daily for type 2 diabetes E11.9   irbesartan  (AVAPRO ) 75 MG tablet TAKE 1 TABLET  BY MOUTH AT  BEDTIME   Lancets (ONETOUCH ULTRASOFT) lancets Use to check sugar twice daily for type 2 diabetes E11.9   levofloxacin (LEVAQUIN) 750 MG tablet Take 750 mg by mouth daily.   magnesium gluconate (MAGONATE) 500 MG tablet Take 500 mg by mouth daily.   Multiple Vitamin (MULTIVITAMIN) tablet Take 1 tablet by mouth daily.   Naproxen Sodium 220 MG CAPS Take by mouth daily as needed.   omalizumab  (XOLAIR ) 150 MG/ML prefilled syringe Inject 300 mg into the skin every 14 (fourteen) days.   Omega 3 1200 MG CAPS Take 2 capsules by mouth daily.   predniSONE  (DELTASONE ) 10 MG tablet Take 3 tabs (30mg ) twice daily for 3 days, then 2 tabs (20mg )  twice daily for 3 days, then 1 tab (10mg ) twice daily for 3 days, then STOP.   Semaglutide ,0.25 or 0.5MG /DOS, 2 MG/3ML SOPN 0.25mg  once a week for 4 weeks then increase to 0.5mg    sertraline  (ZOLOFT ) 50 MG tablet TAKE 2 TABLETS BY MOUTH DAILY   simvastatin  (ZOCOR ) 20 MG tablet Take 1 tablet (20 mg total) by mouth at bedtime.   traZODone  (DESYREL ) 50 MG tablet TAKE 1 TO 2 TABLETS BY MOUTH AT  BEDTIME   Ubiquinol 200 MG CAPS Take by mouth. CoQ10   fexofenadine -pseudoephedrine  (ALLEGRA-D ALLERGY  & CONGESTION) 180-240 MG 24 hr tablet Take 1 tablet by mouth daily.   Facility-Administered Medications Prior to Visit  Medication Dose Route Frequency Provider   omalizumab  (XOLAIR ) injection 300 mg  300 mg Subcutaneous Q28 days Iva Marty Saltness, MD   Review of Systems  Constitutional:  Negative for appetite change, chills, fatigue and fever.  Respiratory:  Negative for chest tightness and shortness of breath.   Cardiovascular:  Negative for chest pain and palpitations.  Gastrointestinal:  Negative for abdominal pain, nausea and vomiting.  Neurological:  Negative for dizziness and weakness.       Objective    BP 126/82   Pulse 86   Resp 18   Ht 5' 1 (1.549 m)   Wt 149 lb (67.6 kg)   SpO2 95%   BMI 28.15 kg/m   Physical Exam   General: Appearance:    Well developed, well nourished female in no acute distress  Eyes:    PERRL, conjunctiva/corneas clear, EOM's intact       Lungs:     Clear to auscultation bilaterally, respirations unlabored  Heart:    Normal heart rate. Normal rhythm. No murmurs, rubs, or gallops.    MS:   All extremities are intact.    Neurologic:   Awake, alert, oriented x 3. No apparent focal neurological defect.        Results for orders placed or performed in visit on 01/18/23  POCT HgB A1C  Result Value Ref Range   Hemoglobin A1C 5.8 (A) 4.0 - 5.6 %    Assessment & Plan        Type 2 Diabetes Mellitus Controlled with Ozempic  0.5mg . Constipation as a  side effect managed with over-the-counter medication. -Continue Ozempic  0.5mg . -Refill prescription to start in March.  Hypertension Medication refill needed. -Send refill prescription to [facility].  Allergic Rhinitis Managed with Allegra D and additional plain Allegra in the morning. Discussed potential switch to Claritin  D, but decided to continue with current regimen. -Continue Allegra D and plain Allegra. -Send 62-month supply of Allegra D to [facility].  Headache Ongoing issue, previous imaging studies normal. Discussed potential referral to a different neurologist. -Consider referral to Dr. Lane if  symptoms persist.  General Health Maintenance / Followup Plans -Schedule follow-up appointment in summer. -Plan to check cholesterol, metabolic panel, and A1c at next visit.    Return in about 5 months (around 06/18/2023) for Diabetes.      Nancyann Perry, MD  [facility] [phone] (phone) [phone] (fax)  [facility]

## 2023-01-21 NOTE — Progress Notes (Signed)
 Specialty Pharmacy Initial Fill Coordination Note  Julia Garner is a 73 y.o. female contacted today regarding initial fill of specialty medication(s) Omalizumab  (Xolair )   Patient requested Delivery   Delivery date: 01/29/23   Verified address: [address]   Medication will be filled on 01/28/23.   Patient is aware of 0.00 copayment.  Patient enrolled in Medicare payment plan on 01/20/22.

## 2023-01-21 NOTE — Telephone Encounter (Signed)
 L/m for patient approval with her part D for Xolair and submit to [facility]. She no longer is approved for PAP and has now sign up for PPP to cover her injs at home

## 2023-01-28 ENCOUNTER — Inpatient Hospital Stay: Payer: Medicare Other

## 2023-01-29 ENCOUNTER — Other Ambulatory Visit (HOSPITAL_COMMUNITY): Payer: Self-pay

## 2023-01-30 ENCOUNTER — Encounter: Payer: Self-pay | Admitting: Oncology

## 2023-01-30 ENCOUNTER — Inpatient Hospital Stay: Payer: Medicare Other | Attending: Oncology | Admitting: Oncology

## 2023-01-30 ENCOUNTER — Other Ambulatory Visit: Payer: Self-pay

## 2023-01-30 ENCOUNTER — Inpatient Hospital Stay: Payer: Medicare Other

## 2023-01-30 VITALS — BP 144/77 | HR 92 | Temp 96.7°F | Resp 18 | Wt 151.6 lb

## 2023-01-30 DIAGNOSIS — D693 Immune thrombocytopenic purpura: Secondary | ICD-10-CM | POA: Diagnosis not present

## 2023-01-30 DIAGNOSIS — G5603 Carpal tunnel syndrome, bilateral upper limbs: Secondary | ICD-10-CM | POA: Insufficient documentation

## 2023-01-30 LAB — CBC WITH DIFFERENTIAL (CANCER CENTER ONLY)
Abs Immature Granulocytes: 0.01 10*3/uL (ref 0.00–0.07)
Basophils Absolute: 0 10*3/uL (ref 0.0–0.1)
Basophils Relative: 0 %
Eosinophils Absolute: 0.4 10*3/uL (ref 0.0–0.5)
Eosinophils Relative: 4 %
HCT: 42.8 % (ref 36.0–46.0)
Hemoglobin: 13.8 g/dL (ref 12.0–15.0)
Immature Granulocytes: 0 %
Lymphocytes Relative: 43 %
Lymphs Abs: 3.4 10*3/uL (ref 0.7–4.0)
MCH: 28.6 pg (ref 26.0–34.0)
MCHC: 32.2 g/dL (ref 30.0–36.0)
MCV: 88.8 fL (ref 80.0–100.0)
Monocytes Absolute: 0.6 10*3/uL (ref 0.1–1.0)
Monocytes Relative: 7 %
Neutro Abs: 3.6 10*3/uL (ref 1.7–7.7)
Neutrophils Relative %: 46 %
Platelet Count: 143 10*3/uL — ABNORMAL LOW (ref 150–400)
RBC: 4.82 MIL/uL (ref 3.87–5.11)
RDW: 13 % (ref 11.5–15.5)
WBC Count: 8.1 10*3/uL (ref 4.0–10.5)
nRBC: 0 % (ref 0.0–0.2)

## 2023-01-30 LAB — CMP (CANCER CENTER ONLY)
ALT: 27 U/L (ref 0–44)
AST: 21 U/L (ref 15–41)
Albumin: 3.7 g/dL (ref 3.5–5.0)
Alkaline Phosphatase: 52 U/L (ref 38–126)
Anion gap: 8 (ref 5–15)
BUN: 8 mg/dL (ref 8–23)
CO2: 27 mmol/L (ref 22–32)
Calcium: 8.9 mg/dL (ref 8.9–10.3)
Chloride: 104 mmol/L (ref 98–111)
Creatinine: 0.66 mg/dL (ref 0.44–1.00)
GFR, Estimated: >60 mL/min (ref >60)
Glucose, Bld: 106 mg/dL — ABNORMAL HIGH (ref 70–99)
Potassium: 3.6 mmol/L (ref 3.5–5.1)
Sodium: 139 mmol/L (ref 135–145)
Total Bilirubin: 0.4 mg/dL (ref 0.0–1.2)
Total Protein: 7.4 g/dL (ref 6.5–8.1)

## 2023-01-30 NOTE — Assessment & Plan Note (Signed)
 Previous has rituximab  treatments Labs are reviewed and discussed with patient. Platelet count is stable, mildly decreased.

## 2023-01-30 NOTE — Progress Notes (Signed)
 Hematology/Oncology Progress note [phone] [phone]    Patient Care Team: Lamon Pillow, MD as PCP - General (Family Medicine) Julia Oats, OD (Optometry) Elihu Grumet, MD as Referring Physician (Otolaryngology) Timmy Forbes, MD as Consulting Physician (Oncology)  REASON FOR VISIT Follow up for thrombocytopenia  ASSESSMENT & PLAN:  Chronic ITP (idiopathic thrombocytopenia) (HCC) Previous has rituximab  treatments Labs are reviewed and discussed with patient. Platelet count is stable, mildly decreased.     Orders Placed This Encounter  Procedures   CMP (Cancer Center only)    Standing Status:   Future    Expected Date:   01/30/2024    Expiration Date:   01/30/2024   CBC with Differential (Cancer Center Only)    Standing Status:   Future    Expected Date:   01/30/2024    Expiration Date:   01/30/2024   Follow up in 12 months.  All questions were answered. The patient knows to call the clinic with any problems, questions or concerns.  Timmy Forbes, MD, PhD [facility] Hematology Oncology 01/30/2023     PERTINENT HEMATOLOGY HISTORY Julia Garner 73 y.o.  female who has a history of recurrent ITP. Before she was referred to me, she has a long history of ITP which were treated with sterids by primary care physician and she used to respond to steroids. She was seen by anther Hematologist Dr.Pandit many years ago. Dr.Pandit has left [place].  She had labs doneon 08/22/2016. Platelet counts were slow at 15,000. She was treated with a short course (5 days) of Steroids by Dr.Fisher and platelet recovered to 120,000, however later dropped again to 11,000 on 09/10/2016.  I treated her with a longer course of prednisone  with slow tapering and her counts again responded to treatment. During the tapering the course,she visited home country [place] and had blood work done there after she finished the tapering course of Prednisone  there. She reports  the platelet counts were normal in [place].   Patient has had lab work up including negative hepatitis, HIV, normal LDH, normal B12 and folate level. ANA was positive and she was evaluated by rheumatologist. Based on my phone discussion with Rheumatologist, patient has had rheumatology work up and was not considered to have any autoimmune problems. She does not have hepatosplenomegaly.   Patient returned to clinic in December and repeat blood work on 12/26/2016 showed platelet count of 16,000. Treated with a course of Dexamethasone  40mg  x 4 days, platelet again responded and dropped again.  And she reports easy bruising, feeling tired.  Peripheral blood flowcytometry 1% of analyzed cells containing clonal B cell population which are CD5- and CD 10-.  There is mildly increased eosinophils 7%.   Patient does not have any palpable lymphadenopathy on physical examination and we obtained CT which showed no pathological lymphadenopathy. Bone marrow biopsy showed hypercellular marrow with trilineage hematopoiesis including increased megakaryocytes. No evidence of lymphoproliferative process involvement. Normal cytogenetics.  At this point patient has had extensive work up for her thrombocytopenia, and we discussed in lengthy that she most likely have ITP, there maybe a low grade lymphoma too. Given that she has recurrent ITP and platelet counts cannot be maintained without steroids, I suggest Rituximab  weekly x 4 for consolidation. Patient has requested me to talk to her son in law Dr.Kamron who is a nephrologist in [place]. I have talked to Dr.Kamron about the rationale of using Rituximab  and also the side effects profile. Dr.Kamron agrees the plan and has  relayed information to patient. I have also discussed the side effects of Rituximab  with patient. She agrees with treatment.   # severe bilateral carpal tunnel syndrome and received joint steroid injections.  INTERVAL HISTORY Julia Garner is a 73 y.o.  female who has above hematology history reviewed by me today presents for follow up visit for ITP.  Patient reports feeling well no new complaints. Reports history of recurrent infection.  She was seen by immunology allergy in the past and plans to make a follow up appt   Review of Systems  Constitutional:  Negative for appetite change, chills, fatigue and fever.  HENT:   Negative for hearing loss and voice change.   Eyes:  Negative for eye problems.  Respiratory:  Negative for chest tightness and cough.   Cardiovascular:  Negative for chest pain.  Gastrointestinal:  Negative for abdominal distention, abdominal pain and blood in stool.  Endocrine: Negative for hot flashes.  Genitourinary:  Negative for difficulty urinating and frequency.   Musculoskeletal:  Negative for arthralgias.  Skin:  Negative for itching and rash.  Neurological:  Negative for extremity weakness.  Hematological:  Negative for adenopathy.  Psychiatric/Behavioral:  Negative for confusion.     MEDICAL HISTORY:  Past Medical History:  Diagnosis Date   Allergy    Chronic ITP (idiopathic thrombocytopenia) (HCC)    Chronic urticaria 08/15/2018   COVID 2023   Depression    Head injuries    History of seizure 2007   one seizure    Hypertension     SURGICAL HISTORY: Past Surgical History:  Procedure Laterality Date   CATARACT EXTRACTION, BILATERAL  around 2021   Dr. Alto Atta   CESAREAN SECTION  1990   CHOLECYSTECTOMY  2005   COLONOSCOPY WITH PROPOFOL  N/A 12/20/2020   Procedure: COLONOSCOPY WITH PROPOFOL ;  Surgeon: Marnee Sink, MD;  Location[facility];  Service: Endoscopy;  Laterality: N/A;   CYST EXCISION  1996   Tracheal Cyst    SOCIAL HISTORY: Social History   Socioeconomic History   Marital status: Married    Spouse name: Not on file   Number of children: 3   Years of education: Not on file   Highest education level: Bachelor's degree (e.g., BA, AB, BS)  Occupational History   Occupation:  retired    Comment: Adult nurse  Tobacco Use   Smoking status: Never   Smokeless tobacco: Never  Vaping Use   Vaping status: Never Used  Substance and Sexual Activity   Alcohol use: No    Alcohol/week: 0.0 standard drinks of alcohol   Drug use: No   Sexual activity: Not on file  Other Topics Concern   Not on file  Social History Narrative   Not on file   Social Drivers of Health   Financial Resource Strain: Low Risk  (01/14/2023)   Overall Financial Resource Strain (CARDIA)    Difficulty of Paying Living Expenses: Not hard at all  Food Insecurity: No Food Insecurity (01/14/2023)   Hunger Vital Sign    Worried About Running Out of Food in the Last Year: Never true    Ran Out of Food in the Last Year: Never true  Transportation Needs: No Transportation Needs (01/14/2023)   PRAPARE - Administrator, Civil Service (Medical): No    Lack of Transportation (Non-Medical): No  Physical Activity: Inactive (11/08/2021)   Exercise Vital Sign    Days of Exercise per Week: 0 days    Minutes of Exercise per Session: 0  min  Stress: No Stress Concern Present (01/14/2023)   [employer] of Occupational Health - Occupational Stress Questionnaire    Feeling of Stress : Only a little  Social Connections: Socially Integrated (01/14/2023)   Social Connection and Isolation Panel [NHANES]    Frequency of Communication with Friends and Family: More than three times a week    Frequency of Social Gatherings with Friends and Family: Once a week    Attends Religious Services: More than 4 times per year    Active Member of [employer] or Organizations: Yes    Attends Engineer, structural: More than 4 times per year    Marital Status: Married  Catering manager Violence: Not At Risk (11/08/2021)   Humiliation, Afraid, Rape, and Kick questionnaire    Fear of Current or Ex-Partner: No    Emotionally Abused: No    Physically Abused: No    Sexually Abused: No    FAMILY  HISTORY: Family History  Problem Relation Age of Onset   Heart attack Father        1st MI at age 46    ALLERGIES:  is allergic to niacin and related, penicillins, statins, and tylenol  [acetaminophen].  MEDICATIONS:  Current Outpatient Medications  Medication Sig Dispense Refill   albuterol  (VENTOLIN  HFA) 108 (90 Base) MCG/ACT inhaler Inhale 1-2 puffs into the lungs every 6 (six) hours as needed for shortness of breath. 1 each 2   ascorbic acid  (VITAMIN C) 500 MG tablet Take 500 mg by mouth daily.     Blood Glucose Monitoring Suppl (ONE TOUCH ULTRA 2) w/Device KIT Use to check sugar daily for type 2 diabetes E11.9 1 kit 0   Calcium Carbonate (CALCIUM 500 PO) Take 1 tablet by mouth daily.     clobetasol  ointment (TEMOVATE ) 0.05 % Apply 1 Application topically 2 (two) times daily. Try to use for only two weeks at a time. 30 g 2   ELDERBERRY PO Take by mouth.     fexofenadine -pseudoephedrine  (ALLEGRA-D ALLERGY & CONGESTION) 180-240 MG 24 hr tablet Take 1 tablet by mouth daily. 90 tablet 3   fluticasone  (FLONASE ) 50 MCG/ACT nasal spray Place 2 sprays into both nostrils daily.     glucose blood (ONETOUCH ULTRA) test strip Use to check sugar twice daily for type 2 diabetes E11.9 100 each 4   irbesartan  (AVAPRO ) 75 MG tablet TAKE 1 TABLET BY MOUTH AT  BEDTIME 90 tablet 1   Lancets (ONETOUCH ULTRASOFT) lancets Use to check sugar twice daily for type 2 diabetes E11.9 100 each 4   levofloxacin (LEVAQUIN) 750 MG tablet Take 750 mg by mouth daily.     magnesium gluconate (MAGONATE) 500 MG tablet Take 500 mg by mouth daily.     Multiple Vitamin (MULTIVITAMIN) tablet Take 1 tablet by mouth daily.     Naproxen Sodium 220 MG CAPS Take by mouth daily as needed.     omalizumab  (XOLAIR ) 300 MG/2  ML prefilled syringe Inject 300 mg into the skin every 28 (twenty-eight) days. 2 mL 11   Omega 3 1200 MG CAPS Take 2 capsules by mouth daily.     Semaglutide ,0.25 or 0.5MG /DOS, 2 MG/3ML SOPN 0.25mg  once a week  for 4 weeks then increase to 0.5mg  9 mL 2   sertraline  (ZOLOFT ) 50 MG tablet TAKE 2 TABLETS BY MOUTH DAILY 200 tablet 2   simvastatin  (ZOCOR ) 20 MG tablet Take 1 tablet (20 mg total) by mouth at bedtime. 90 tablet 3   traZODone  (DESYREL ) 50  MG tablet TAKE 1 TO 2 TABLETS BY MOUTH AT  BEDTIME 180 tablet 3   Ubiquinol 200 MG CAPS Take by mouth. CoQ10     Current Facility-Administered Medications  Medication Dose Route Frequency Provider Last Rate Last Admin   omalizumab  (XOLAIR ) injection 300 mg  300 mg Subcutaneous Q28 days Rochester Chuck, MD   300 mg at 09/29/19 1523      .  PHYSICAL EXAMINATION: ECOG PERFORMANCE STATUS: 0 - Asymptomatic Vitals:   01/30/23 1403  BP: (!) 144/77  Pulse: 92  Resp: 18  Temp: (!) 96.7 F (35.9 C)  SpO2: 100%   Filed Weights   01/30/23 1403  Weight: 151 lb 9.6 oz (68.8 kg)  Physical Exam Constitutional:      General: She is not in acute distress.    Appearance: She is not diaphoretic.  HENT:     Head: Normocephalic and atraumatic.  Eyes:     General: No scleral icterus. Neck:     Vascular: No JVD.  Cardiovascular:     Rate and Rhythm: Normal rate and regular rhythm.  Pulmonary:     Effort: Pulmonary effort is normal. No respiratory distress.     Breath sounds: Normal breath sounds. No wheezing.  Abdominal:     General: Bowel sounds are normal. There is no distension.     Palpations: Abdomen is soft.  Musculoskeletal:        General: Normal range of motion.     Cervical back: Normal range of motion and neck supple.  Skin:    Findings: No rash.  Neurological:     Mental Status: She is alert and oriented to person, place, and time. Mental status is at baseline.     Cranial Nerves: No cranial nerve deficit.     Motor: No abnormal muscle tone.  Psychiatric:        Mood and Affect: Mood and affect normal.        Cognition and Memory: Memory normal.          LABORATORY DATA:  I have reviewed the data as listed Lab Results   Component Value Date   WBC 8.1 01/30/2023   HGB 13.8 01/30/2023   HCT 42.8 01/30/2023   MCV 88.8 01/30/2023   PLT 143 (L) 01/30/2023   Recent Labs    07/30/22 1411 01/30/23 1353  NA 138 139  K 3.7 3.6  CL 104 104  CO2 26 27  GLUCOSE 101* 106*  BUN 10 8  CREATININE 0.73 0.66  CALCIUM 8.7* 8.9  GFRNONAA >60 >60  PROT 7.8 7.4  ALBUMIN 3.9 3.7  AST 23 21  ALT 23 27  ALKPHOS 52 52  BILITOT 0.4 0.4   RADIOGRAPHIC STUDIES: I have personally reviewed the radiological images as listed and agreed with the findings in the report. DG Chest 2 View Result Date: 12/21/2022 CLINICAL DATA:  Cough and short of breath EXAM: CHEST - 2 VIEW COMPARISON:  07/26/2021 FINDINGS: The heart size and mediastinal contours are within normal limits. Aortic atherosclerosis. both lungs are clear. The visualized skeletal structures are unremarkable. IMPRESSION: No active cardiopulmonary disease. Electronically Signed   By: Esmeralda Hedge M.D.   On: 12/21/2022 15:19

## 2023-02-01 ENCOUNTER — Other Ambulatory Visit: Payer: Self-pay | Admitting: Family Medicine

## 2023-02-01 NOTE — Telephone Encounter (Signed)
Requesting too soon last ordered 10/01/22 #90, 1RF. 1 year supply not appropriate. Requested Prescriptions  Pending Prescriptions Disp Refills   irbesartan (AVAPRO) 75 MG tablet [Pharmacy Med Name: IRBESARTAN  75MG   TAB] 80 tablet 3    Sig: TAKE 1 TABLET BY MOUTH AT  BEDTIME     Cardiovascular:  Angiotensin Receptor Blockers Failed - 02/01/2023 11:58 AM      Failed - Last BP in normal range    BP Readings from Last 1 Encounters:  01/30/23 (!) 144/77         Passed - Cr in normal range and within 180 days    Creatinine  Date Value Ref Range Status  01/30/2023 0.66 0.44 - 1.00 mg/dL Final         Passed - K in normal range and within 180 days    Potassium  Date Value Ref Range Status  01/30/2023 3.6 3.5 - 5.1 mmol/L Final         Passed - Patient is not pregnant      Passed - Valid encounter within last 6 months    Recent Outpatient Visits           2 weeks ago Diabetes mellitus without complication (HCC)   [place] [facility] Malva Limes, MD   1 month ago Acute cough   [place] [facility] Malva Limes, MD   4 months ago Other seasonal allergic rhinitis   [place] [facility] Malva Limes, MD   8 months ago Other seasonal allergic rhinitis   [place] [facility] Malva Limes, MD   9 months ago Diabetes mellitus without complication [facility])   [place] [facility] Malva Limes, MD       Future Appointments             In 1 month Dellis Anes, Hetty Ely, MD  [facility] [place]   In 4 months Fisher, Demetrios Isaacs, MD [facility]

## 2023-02-04 ENCOUNTER — Other Ambulatory Visit: Payer: Self-pay

## 2023-02-04 MED ORDER — XOLAIR 300 MG/2ML ~~LOC~~ SOSY
300.0000 mg | PREFILLED_SYRINGE | SUBCUTANEOUS | 11 refills | Status: DC
Start: 2023-02-04 — End: 2023-04-02
  Filled 2023-02-04 – 2023-02-06 (×3): qty 4, 28d supply, fill #0
  Filled 2023-03-06: qty 4, 28d supply, fill #1

## 2023-02-04 NOTE — Addendum Note (Signed)
Addended by: Devoria Glassing on: 02/04/2023 10:03 AM   Modules accepted: Orders

## 2023-02-06 ENCOUNTER — Other Ambulatory Visit: Payer: Self-pay

## 2023-02-06 NOTE — Progress Notes (Signed)
Specialty Pharmacy Refill Coordination Note  Deannah Lumpkins is a 73 y.o. female contacted today regarding refills of specialty medication(s) Omalizumab (Xolair)   Patient requested Delivery   Delivery date: 02/07/23   Verified address: [address], [place], [address]   Medication will be filled on 02/06/23.

## 2023-02-15 ENCOUNTER — Other Ambulatory Visit: Payer: Self-pay | Admitting: Family Medicine

## 2023-02-15 DIAGNOSIS — E119 Type 2 diabetes mellitus without complications: Secondary | ICD-10-CM

## 2023-02-15 MED ORDER — SEMAGLUTIDE(0.25 OR 0.5MG/DOS) 2 MG/3ML ~~LOC~~ SOPN: 0.5000 mg | PEN_INJECTOR | SUBCUTANEOUS | 2 refills | Status: DC

## 2023-02-21 DIAGNOSIS — H1045 Other chronic allergic conjunctivitis: Secondary | ICD-10-CM | POA: Diagnosis not present

## 2023-02-21 DIAGNOSIS — H43813 Vitreous degeneration, bilateral: Secondary | ICD-10-CM | POA: Diagnosis not present

## 2023-02-21 DIAGNOSIS — E119 Type 2 diabetes mellitus without complications: Secondary | ICD-10-CM | POA: Diagnosis not present

## 2023-03-06 ENCOUNTER — Other Ambulatory Visit: Payer: Self-pay

## 2023-03-06 NOTE — Progress Notes (Signed)
Specialty Pharmacy Refill Coordination Note  Julia Garner is a 73 y.o. female contacted today regarding refills of specialty medication(s) Omalizumab (Xolair)   Patient requested Delivery   Delivery date: 03/14/23   Verified address: [address], [place], [address]   Medication will be filled on 02.26.25.

## 2023-03-07 ENCOUNTER — Other Ambulatory Visit: Payer: Self-pay

## 2023-03-07 ENCOUNTER — Ambulatory Visit: Payer: Medicare Other | Admitting: Neurology

## 2023-03-07 ENCOUNTER — Telehealth: Payer: Self-pay | Admitting: Family Medicine

## 2023-03-07 MED ORDER — FLUTICASONE PROPIONATE 50 MCG/ACT NA SUSP: 2.0000 | Freq: Every day | NASAL | 2 refills | Status: DC

## 2023-03-07 NOTE — Telephone Encounter (Signed)
[facility] faxed refill request for the following medications:  fluticasone (FLONASE) 50 MCG/ACT nasal spray   Please advise.

## 2023-03-08 ENCOUNTER — Other Ambulatory Visit: Payer: Self-pay | Admitting: Family Medicine

## 2023-03-08 ENCOUNTER — Other Ambulatory Visit: Payer: Self-pay

## 2023-03-13 ENCOUNTER — Other Ambulatory Visit: Payer: Self-pay

## 2023-03-19 ENCOUNTER — Encounter: Payer: Self-pay | Admitting: Allergy & Immunology

## 2023-03-19 ENCOUNTER — Other Ambulatory Visit: Payer: Self-pay

## 2023-03-19 ENCOUNTER — Ambulatory Visit: Payer: Medicare Other | Admitting: Allergy & Immunology

## 2023-03-19 VITALS — BP 122/76 | HR 88 | Temp 98.4°F | Resp 18

## 2023-03-19 DIAGNOSIS — J4520 Mild intermittent asthma, uncomplicated: Secondary | ICD-10-CM

## 2023-03-19 DIAGNOSIS — L508 Other urticaria: Secondary | ICD-10-CM | POA: Diagnosis not present

## 2023-03-19 DIAGNOSIS — J302 Other seasonal allergic rhinitis: Secondary | ICD-10-CM | POA: Diagnosis not present

## 2023-03-19 MED ORDER — LEVOCETIRIZINE DIHYDROCHLORIDE 5 MG PO TABS: 5.0000 mg | ORAL_TABLET | Freq: Every evening | ORAL | 3 refills | Status: DC

## 2023-03-19 MED ORDER — EPINASTINE HCL 0.05 % OP SOLN: 1.0000 [drp] | Freq: Two times a day (BID) | OPHTHALMIC | 12 refills | Status: DC

## 2023-03-19 NOTE — Patient Instructions (Addendum)
 1. Hives - Use up the Xolair you have. - Then transition to [facility] (because we can do skin testing while you are this medication). - Information on Nemluvio provided. 4 - Then after you have been off of Xolair for 3 months, we can do repeat skin testing.   2. Intermittent asthma, uncomplicated  - Continue with albuterol as needed.  - Lung testing looked good today.  3. Seasonal allergies (trees)  - This is prime tree season. - Stop the Pataday and start epinastine twice daily instead.  - Change the Allegra-D to EVERY OTHER DAY (to try to get off of the decongestant in the long term).  - Start Xyzal 5mg  daily to see if this is better than the other antihistamines.   4. Return in about 6 months (around 09/19/2023). You can have the follow up appointment with Dr. Dellis Anes or a Nurse Practicioner (our Nurse Practitioners are excellent and always have Physician oversight!).    Please inform us of any Emergency Department visits, hospitalizations, or changes in symptoms. Call us before going to the ED for breathing or allergy symptoms since we might be able to fit you in for a sick visit. Feel free to contact us anytime with any questions, problems, or concerns.  It was a pleasure to see you again today!  Websites that have reliable patient information: 1. American Academy of Asthma, Allergy, and Immunology: www.aaaai.org 2. Food Allergy Research and Education (FARE): foodallergy.org 3. Mothers of Asthmatics: http://www.asthmacommunitynetwork.org 4. American College of Allergy, Asthma, and Immunology: www.acaai.org      "Like" [place] on Facebook and Instagram for our latest updates!      A healthy democracy works best when [employer] participate! Make sure you are registered to vote! If you have moved or changed any of your contact information, you will need to get this updated before voting! Scan the QR codes below to learn more!

## 2023-03-19 NOTE — Progress Notes (Unsigned)
 FOLLOW UP  Date of Service/Encounter:  03/19/23   Assessment:   Chronic urticaria - controlled with Xolair monthly, but with some breakthrough episodes   New onset rash - ? 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 Hives - Use up the Xolair you have. - Then transition to [facility] (because we can do skin testing while you are this medication). - Information on Nemluvio provided. 4 - Then after you have been off of Xolair for 3 months, we can do repeat skin testing.   2. Intermittent asthma, uncomplicated  - Continue with albuterol as needed.  - Lung testing looked good today.  3. Seasonal allergies (trees)  - This is prime tree season. - Stop the Pataday and start epinastine twice daily instead.  - Change the Allegra-D to EVERY OTHER DAY (to try to get off of the decongestant in the long term).  - Start Xyzal 5mg  daily to see if this is better than the other antihistamines.   4. Return in about 6 months (around 09/19/2023). You can have the follow up appointment with Dr. Dellis Anes or a Nurse Practicioner (our Nurse Practitioners are excellent and always have Physician oversight!).    Please inform us of any Emergency Department visits, hospitalizations, or changes in symptoms. Call us before going to the ED for breathing or allergy symptoms since we might be able to fit you in for a sick visit. Feel free to contact us anytime with any questions, problems, or concerns.  It was a pleasure to see you again today!  Websites that have reliable patient information: 1. American  Academy of Asthma, Allergy, and Immunology: www.aaaai.org 2. Food Allergy Research and Education (FARE): foodallergy.org 3. Mothers of Asthmatics: http://www.asthmacommunitynetwork.org 4. American College of Allergy, Asthma, and Immunology: www.acaai.org      "Like" [place] on Facebook and Instagram for our latest updates!      A healthy democracy works best when [employer] participate! Make sure you are registered to vote! If you have moved or changed any of your contact information, you will need to get this updated before voting! Scan the QR codes below to learn more!             Subjective:   Nara Paternoster is a 73 y.o. female presenting today for follow up of  Chief Complaint  Patient presents with  . Asthma  . Urticaria    Nyella Eckels has a history of the following: Patient Active Problem List   Diagnosis Date Noted  . Hypocalcemia 01/29/2022  . Uncontrolled type 2 diabetes mellitus with hyperglycemia, without long-term current use of insulin (HCC) 07/24/2021  . Hypokalemia 07/24/2021  . Connective tissue disease (HCC) 07/20/2021  . Hyperplastic colon polyp   . SVT (supraventricular tachycardia) (HCC) 06/08/2020  . Aortic atherosclerosis (HCC) 11/04/2018  . Fatty liver 11/04/2018  . Chronic urticaria 08/15/2018  . Diabetes mellitus without complication (HCC) 09/15/2017  . Other seasonal allergic rhinitis 06/29/2014  . Insomnia 06/29/2014  . Oral aphthae 07/08/2007  . 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 Arthropathy of hand 01/25/2007  .  Fam hx-ischem heart disease 01/21/2007  . Chronic ITP (idiopathic thrombocytopenia) (HCC) 10/08/2005  . Obstructive sleep apnea of adult 10/24/2004  . Essential (primary) hypertension 01/16/2004  . Mood disorder (HCC) 01/15/2002    History obtained from: chart review and patient.  Discussed the use of AI scribe software for clinical note transcription with the patient and/or guardian, who gave verbal consent to  proceed.  Kathaleen is a 73 y.o. female presenting for a follow up visit. She was last seen in September 2024. At that time, we continued with Xolair every two weeks. We did start her on a prednisone taper. For her asthma, we continued with albuterol as needed.   Since the last visit, she has done well.   Discussed the use of AI scribe software for clinical note transcription with the patient, who gave verbal consent to proceed.  History of Present Illness           Since last visit, she has done well. She is going to [place] with her husband. She is leaving on October 7th. They are going to be gone for two months and she needs enough Xolair to be able to administer it to herself.  But she and her husband are from [place], but they are also going to be traveling to [place] as well as [place].   Asthma/Respiratory Symptom History: ***  Allergic Rhinitis Symptom History: ***  Food Allergy Symptom History: ***  Skin Symptom History: ***  GERD Symptom History: ***  Infection Symptom History: ***  Otherwise, there have been no changes to her past medical history, surgical history, family history, or social history.    Review of systems otherwise negative other than that mentioned in the HPI.    Objective:   Blood pressure 122/76, pulse 88, temperature 98.4 F (36.9 C), temperature source Temporal, resp. rate 18, SpO2 98%. There is no height or weight on file to calculate BMI.    Physical Exam   Diagnostic studies:    Spirometry: results normal (FEV1: 1.71/93%, FVC: 1.97/85%, FEV1/FVC: 87%).    Spirometry consistent with normal pattern. {Blank single:19197::"Albuterol/Atrovent nebulizer","Xopenex/Atrovent nebulizer","Albuterol nebulizer","Albuterol four puffs via MDI","Xopenex four puffs via MDI"} treatment given in clinic with {Blank single:19197::"significant improvement in FEV1 per ATS criteria","significant improvement in FVC per ATS criteria","significant  improvement in FEV1 and FVC per ATS criteria","improvement in FEV1, but not significant per ATS criteria","improvement in FVC, but not significant per ATS criteria","improvement in FEV1 and FVC, but not significant per ATS criteria","no improvement"}.  Allergy Studies: {Blank single:19197::"none","deferred due to recent antihistamine use","deferred due to insurance stipulations that require a separate visit for testing","labs sent instead"," "}    {Blank single:19197::"Allergy testing results were read and interpreted by myself, documented by clinical staff."," "}      Malachi Bonds, MD  [facility]

## 2023-03-20 ENCOUNTER — Encounter: Payer: Self-pay | Admitting: Allergy & Immunology

## 2023-03-20 DIAGNOSIS — Z961 Presence of intraocular lens: Secondary | ICD-10-CM | POA: Diagnosis not present

## 2023-03-20 DIAGNOSIS — H35373 Puckering of macula, bilateral: Secondary | ICD-10-CM | POA: Diagnosis not present

## 2023-03-20 DIAGNOSIS — H5213 Myopia, bilateral: Secondary | ICD-10-CM | POA: Diagnosis not present

## 2023-03-26 NOTE — Progress Notes (Signed)
 Ins plan prefers Dupixent please advise

## 2023-04-02 ENCOUNTER — Telehealth: Payer: Self-pay | Admitting: *Deleted

## 2023-04-02 NOTE — Telephone Encounter (Signed)
 L/M fo patient to contact me to advise approval and submit for Dupixent to [facility]

## 2023-04-02 NOTE — Telephone Encounter (Signed)
-----   Message from Alfonse Spruce sent at 03/26/2023  1:44 PM EDT ----- I guess Dupixent.... you never let me have any fun.

## 2023-04-03 ENCOUNTER — Other Ambulatory Visit (HOSPITAL_COMMUNITY): Payer: Self-pay

## 2023-04-03 MED ORDER — DUPIXENT 300 MG/2ML ~~LOC~~ SOSY
600.0000 mg | PREFILLED_SYRINGE | Freq: Once | SUBCUTANEOUS | 11 refills | Status: DC
  Filled 2023-04-04: qty 4, 14d supply, fill #0
  Filled 2023-04-26: qty 4, 28d supply, fill #1
  Filled 2023-05-02 – 2023-05-29 (×3): qty 4, 28d supply, fill #2
  Filled 2023-07-04: qty 4, 28d supply, fill #3
  Filled 2023-07-22: qty 4, 28d supply, fill #4

## 2023-04-03 NOTE — Telephone Encounter (Signed)
 Patient called and I advised change to Dupixent instead of Nemluvio due to Ins and rx to [place]. I wil reach out to schedule initial loading dose in clinic then she admin at home every 2 weeks

## 2023-04-03 NOTE — Telephone Encounter (Signed)
 Thanks, Tam Tam!   Malachi Bonds, MD [facility]

## 2023-04-04 ENCOUNTER — Other Ambulatory Visit (HOSPITAL_COMMUNITY): Payer: Self-pay

## 2023-04-04 ENCOUNTER — Other Ambulatory Visit: Payer: Self-pay

## 2023-04-04 NOTE — Progress Notes (Signed)
 Specialty Pharmacy Initiation Note   Julia Garner is a 73 y.o. female who will be followed by the specialty pharmacy service for RxSp Allergy    Review of administration, indication, effectiveness, safety, potential side effects, storage/disposable, and missed dose instructions occurred today for patient's specialty medication(s) Dupilumab (Dupixent)     Patient/Caregiver did not have any additional questions or concerns.   Patient's therapy is appropriate to: Initiate    Goals Addressed             This Visit's Progress    Minimize recurrence of flares       Patient is  transitioning therapy from Xolair to Dupixent . Patient will maintain adherence          Otto Herb Specialty Pharmacist

## 2023-04-04 NOTE — Progress Notes (Signed)
 Specialty Pharmacy Initial Fill Coordination Note  Julia Garner is a 73 y.o. female contacted today regarding initial fill of specialty medication(s) Dupilumab (Dupixent)   Patient requested Courier to Provider Office   Delivery date: 04/09/23   Verified address: [address] [place] [address]   Medication will be filled on 03/24.   Patient is aware of $0.00 copayment.

## 2023-04-08 ENCOUNTER — Other Ambulatory Visit: Payer: Self-pay

## 2023-04-09 ENCOUNTER — Other Ambulatory Visit: Payer: Self-pay

## 2023-04-09 NOTE — Telephone Encounter (Signed)
 Received patient's Dupixent in office. I called the patient and got them scheduled to come in and start in the [place] office.

## 2023-04-16 ENCOUNTER — Other Ambulatory Visit (HOSPITAL_COMMUNITY): Payer: Self-pay

## 2023-04-19 ENCOUNTER — Other Ambulatory Visit (HOSPITAL_COMMUNITY): Payer: Self-pay

## 2023-04-24 ENCOUNTER — Ambulatory Visit

## 2023-04-24 DIAGNOSIS — L209 Atopic dermatitis, unspecified: Secondary | ICD-10-CM

## 2023-04-24 MED ORDER — DUPILUMAB 300 MG/2ML ~~LOC~~ SOSY
600.0000 mg | PREFILLED_SYRINGE | Freq: Once | SUBCUTANEOUS | Status: AC
Start: 2023-04-24 — End: 2023-04-24
  Administered 2023-04-24: 600 mg via SUBCUTANEOUS

## 2023-04-24 NOTE — Progress Notes (Signed)
 Immunotherapy   Patient Details  Name: Anara Cowman MRN: [id number] Date of Birth: 08/25/1950  04/24/2023  Maylene Roes started injections for  eczema. Patient was shown how to self administer, patient demonstrated correct self administration. Patient received a loading dose of 600 mg Dupixent. Patient waited 15 minutes with no problems. Patient will continue to self administer at home.  Frequency: every 14 days Consent signed and patient instructions given.   Dub Mikes 04/24/2023, 2:16 PM

## 2023-04-26 ENCOUNTER — Other Ambulatory Visit: Payer: Self-pay

## 2023-04-26 ENCOUNTER — Other Ambulatory Visit: Payer: Self-pay | Admitting: Pharmacy Technician

## 2023-04-26 NOTE — Progress Notes (Signed)
 Specialty Pharmacy Refill Coordination Note  Julia Garner is a 73 y.o. female contacted today regarding refills of specialty medication(s) Dupilumab (Dupixent)   Patient requested Delivery   Delivery date: 05/03/23   Verified address: [address] [place]   Medication will be filled on 05/02/23.

## 2023-04-30 ENCOUNTER — Other Ambulatory Visit (HOSPITAL_COMMUNITY): Payer: Self-pay

## 2023-04-30 ENCOUNTER — Other Ambulatory Visit: Payer: Self-pay

## 2023-05-02 ENCOUNTER — Other Ambulatory Visit: Payer: Self-pay | Admitting: Family Medicine

## 2023-05-02 ENCOUNTER — Other Ambulatory Visit: Payer: Self-pay

## 2023-05-20 ENCOUNTER — Other Ambulatory Visit: Payer: Self-pay | Admitting: Family Medicine

## 2023-05-20 DIAGNOSIS — E119 Type 2 diabetes mellitus without complications: Secondary | ICD-10-CM

## 2023-05-28 ENCOUNTER — Other Ambulatory Visit: Payer: Self-pay

## 2023-05-29 ENCOUNTER — Other Ambulatory Visit (HOSPITAL_COMMUNITY): Payer: Self-pay

## 2023-05-29 ENCOUNTER — Other Ambulatory Visit: Payer: Self-pay

## 2023-05-29 NOTE — Progress Notes (Signed)
 Specialty Pharmacy Refill Coordination Note  Julia Garner is a 73 y.o. female contacted today regarding refills of specialty medication(s) Dupilumab  (Dupixent )   Patient requested Delivery   Delivery date: 06/05/23   Verified address: [address]  [place] [address]   Medication will be filled on 06/04/23.

## 2023-06-04 ENCOUNTER — Other Ambulatory Visit: Payer: Self-pay

## 2023-06-21 ENCOUNTER — Ambulatory Visit: Payer: Self-pay | Admitting: Family Medicine

## 2023-06-26 ENCOUNTER — Encounter: Payer: Self-pay | Admitting: Family Medicine

## 2023-06-26 ENCOUNTER — Ambulatory Visit: Admitting: Family Medicine

## 2023-06-26 VITALS — BP 125/72 | HR 85 | Ht 61.0 in | Wt 155.9 lb

## 2023-06-26 DIAGNOSIS — E119 Type 2 diabetes mellitus without complications: Secondary | ICD-10-CM | POA: Diagnosis not present

## 2023-06-26 DIAGNOSIS — E1165 Type 2 diabetes mellitus with hyperglycemia: Secondary | ICD-10-CM | POA: Diagnosis not present

## 2023-06-26 DIAGNOSIS — I700 Atherosclerosis of aorta: Secondary | ICD-10-CM | POA: Diagnosis not present

## 2023-06-26 DIAGNOSIS — K760 Fatty (change of) liver, not elsewhere classified: Secondary | ICD-10-CM | POA: Diagnosis not present

## 2023-06-26 DIAGNOSIS — I10 Essential (primary) hypertension: Secondary | ICD-10-CM | POA: Diagnosis not present

## 2023-06-26 DIAGNOSIS — Z7985 Long-term (current) use of injectable non-insulin antidiabetic drugs: Secondary | ICD-10-CM

## 2023-06-26 DIAGNOSIS — F39 Unspecified mood [affective] disorder: Secondary | ICD-10-CM

## 2023-06-26 DIAGNOSIS — E785 Hyperlipidemia, unspecified: Secondary | ICD-10-CM

## 2023-06-26 LAB — POCT GLYCOSYLATED HEMOGLOBIN (HGB A1C)
Est. average glucose Bld gHb Est-mCnc: 120
Hemoglobin A1C: 5.8 % — AB (ref 4.0–5.6)

## 2023-06-26 MED ORDER — SEMAGLUTIDE (1 MG/DOSE) 4 MG/3ML ~~LOC~~ SOPN
1.0000 mg | PEN_INJECTOR | SUBCUTANEOUS | 1 refills | Status: DC
Start: 2023-06-26 — End: 2023-09-13

## 2023-06-26 NOTE — Progress Notes (Signed)
 Established patient visit   Patient: Julia Garner   DOB: [birth year]   73 y.o. Female  MRN: [id number] Visit Date: 06/26/2023  Today's healthcare provider: Jeralene Mom, MD   Chief Complaint  Patient presents with   Medical Management of Chronic Issues    Follow-up DM   Subjective    HPI  Follow up type 2 diabetes, lipids, depression, fatty liver and htn. Feels well. Only c/o is that once or twice a months she have a day of feeling completely exhausted for no apparent reason, which resolves in 1-2 days. No other associated symptoms.   Fibrosis 4 Score = 1.98  Fib-4 interpretation is not validated for people under 35 or over 31 years of age. However, scores under 2.0 are generally considered low risk.  Lab Results  Component Value Date   HGBA1C 5.8 (A) 06/26/2023   HGBA1C 5.8 (A) 01/18/2023   HGBA1C 6.2 (A) 04/13/2022   Lab Results  Component Value Date   CHOL 129 04/17/2022   HDL 33 (L) 04/17/2022   LDLCALC 59 04/17/2022   TRIG 227 (H) 04/17/2022   CHOLHDL 3.9 04/17/2022   Last metabolic panel Lab Results  Component Value Date   GLUCOSE 106 (H) 01/30/2023   NA 139 01/30/2023   K 3.6 01/30/2023   CL 104 01/30/2023   CO2 27 01/30/2023   BUN 8 01/30/2023   CREATININE 0.66 01/30/2023   GFRNONAA >60 01/30/2023   CALCIUM 8.9 01/30/2023   PHOS 5.3 (H) 07/26/2021   PROT 7.4 01/30/2023   ALBUMIN 3.7 01/30/2023   LABGLOB 3.4 08/08/2021   AGRATIO 1.1 (L) 08/08/2021   BILITOT 0.4 01/30/2023   ALKPHOS 52 01/30/2023   AST 21 01/30/2023   ALT 27 01/30/2023   ANIONGAP 8 01/30/2023     Medications: Outpatient Medications Prior to Visit  Medication Sig   ascorbic acid  (VITAMIN C) 500 MG tablet Take 500 mg by mouth daily.   Blood Glucose Monitoring Suppl (ONE TOUCH ULTRA 2) w/Device KIT Use to check sugar daily for type 2 diabetes E11.9   Calcium Carbonate (CALCIUM 500 PO) Take 1 tablet by mouth daily.   clobetasol  ointment (TEMOVATE ) 0.05 % Apply 1  Application topically 2 (two) times daily. Try to use for only two weeks at a time.   dupilumab  (DUPIXENT ) 300 MG/2ML prefilled syringe Inject 600 mg into the skin once for 1 dose. Then 300mg  every 14 days   ELDERBERRY PO Take by mouth.   Epinastine HCl 0.05 % ophthalmic solution Place 1 drop into both eyes 2 (two) times daily.   fexofenadine -pseudoephedrine  (ALLEGRA-D ALLERGY & CONGESTION) 180-240 MG 24 hr tablet Take 1 tablet by mouth daily. (Patient taking differently: Take 1 tablet by mouth daily as needed.)   fluticasone  (FLONASE ) 50 MCG/ACT nasal spray Place 2 sprays into both nostrils daily.   glucose blood (ONETOUCH ULTRA) test strip Use to check sugar twice daily for type 2 diabetes E11.9   irbesartan  (AVAPRO ) 75 MG tablet TAKE 1 TABLET BY MOUTH AT  BEDTIME   Lancets (ONETOUCH ULTRASOFT) lancets Use to check sugar twice daily for type 2 diabetes E11.9   levocetirizine (XYZAL ) 5 MG tablet Take 1 tablet (5 mg total) by mouth every evening.   magnesium gluconate (MAGONATE) 500 MG tablet Take 500 mg by mouth daily.   Multiple Vitamin (MULTIVITAMIN) tablet Take 1 tablet by mouth daily.   Naproxen Sodium 220 MG CAPS Take by mouth daily as needed.   Omega 3 1200 MG  CAPS Take 2 capsules by mouth daily.   sertraline  (ZOLOFT ) 50 MG tablet TAKE 2 TABLETS BY MOUTH DAILY   simvastatin  (ZOCOR ) 20 MG tablet Take 1 tablet (20 mg total) by mouth at bedtime.   traZODone  (DESYREL ) 50 MG tablet TAKE 1 TO 2 TABLETS BY MOUTH AT  BEDTIME   Ubiquinol 200 MG CAPS Take by mouth. CoQ10   [DISCONTINUED] OZEMPIC , 0.25 OR 0.5 MG/DOSE, 2 MG/3ML SOPN INJECT 0.25MG  SUBCUTANEOUSLY ONCE A WEEK THEN  INCREASE  TO  0.5MG   ONCE  WEEKLY   albuterol  (VENTOLIN  HFA) 108 (90 Base) MCG/ACT inhaler Inhale 1-2 puffs into the lungs every 6 (six) hours as needed for shortness of breath. (Patient not taking: Reported on 06/26/2023)   [DISCONTINUED] levofloxacin (LEVAQUIN) 750 MG tablet Take 750 mg by mouth daily.   No  facility-administered medications prior to visit.    Review of Systems     Objective    BP 125/72 (BP Location: Left Arm, Patient Position: Sitting, Cuff Size: Normal)   Pulse 85   Ht 5' 1 (1.549 m)   Wt 155 lb 14.4 oz (70.7 kg)   SpO2 97%   BMI 29.46 kg/m    Physical Exam   General: Appearance:    Well developed, well nourished female in no acute distress  Eyes:    PERRL, conjunctiva/corneas clear, EOM's intact       Lungs:     Clear to auscultation bilaterally, respirations unlabored  Heart:    Normal heart rate. Normal rhythm. No murmurs, rubs, or gallops.    MS:   All extremities are intact.    Neurologic:   Awake, alert, oriented x 3. No apparent focal neurological defect.         Results for orders placed or performed in visit on 06/26/23  POCT glycosylated hemoglobin (Hb A1C)  Result Value Ref Range   Hemoglobin A1C 5.8 (A) 4.0 - 5.6 %   Est. average glucose Bld gHb Est-mCnc 120     Assessment & Plan     1. Diabetes mellitus without complication (HCC) (Primary) Tolerating Ozempic  well with mild constipation. Counseled on strategies to mitigate constipation. Will increase to 1mg  for better glycemic control, weight control, and treatment of fatty liver.  - Urine microalbumin-creatinine with uACR  2. Fatty liver  Fib-4 score = 1.98 based on previous last year.   - Semaglutide , 1 MG/DOSE, 4 MG/3ML SOPN; Inject 1 mg into the skin once a week.  Dispense: 9 mL; Refill: 1 - CBC - Comprehensive metabolic panel with GFR  3. Essential (primary) hypertension Well controlled.  Continue current medications.    4. Aortic atherosclerosis (HCC) Asymptomatic. Compliant with medication.  Continue aggressive risk factor modification.    5. Mood disorder (HCC) Doing well with current regiment of sertraline  and trazodone .   6. Dyslipidemia  She is tolerating simvastatin  well with no adverse effects.   - Lipid panel  Return in about 4 months (around 10/26/2023) for  Diabetes, Hypertension.         Jeralene Mom, MD  [facility] [phone] (phone) [phone] (fax)  [facility]

## 2023-06-26 NOTE — Patient Instructions (Signed)
 Julia Garner  Please review the attached list of medications and notify my office if there are any errors.   . Please bring all of your medications to every appointment so we can make sure that our medication list is the same as yours.

## 2023-06-27 ENCOUNTER — Encounter: Payer: Self-pay | Admitting: Family Medicine

## 2023-06-27 LAB — COMPREHENSIVE METABOLIC PANEL WITH GFR
ALT: 21 IU/L (ref 0–32)
AST: 19 IU/L (ref 0–40)
Albumin: 3.9 g/dL (ref 3.9–4.9)
Alkaline Phosphatase: 64 IU/L (ref 44–121)
BUN/Creatinine Ratio: 12 (ref 12–28)
BUN: 9 mg/dL (ref 8–27)
Bilirubin Total: 0.3 mg/dL (ref 0.0–1.2)
CO2: 21 mmol/L (ref 20–29)
Calcium: 8.7 mg/dL (ref 8.7–10.3)
Chloride: 104 mmol/L (ref 96–106)
Creatinine, Ser: 0.74 mg/dL (ref 0.57–1.00)
Globulin, Total: 3.6 g/dL (ref 1.5–4.5)
Glucose: 113 mg/dL — ABNORMAL HIGH (ref 70–99)
Potassium: 4.1 mmol/L (ref 3.5–5.2)
Sodium: 140 mmol/L (ref 134–144)
Total Protein: 7.5 g/dL (ref 6.0–8.5)
eGFR: 87 mL/min/{1.73_m2} (ref >59)

## 2023-06-27 LAB — LIPID PANEL
Chol/HDL Ratio: 4.5 ratio — ABNORMAL HIGH (ref 0.0–4.4)
Cholesterol, Total: 134 mg/dL (ref 100–199)
HDL: 30 mg/dL — ABNORMAL LOW (ref >39)
LDL Chol Calc (NIH): 72 mg/dL (ref 0–99)
Triglycerides: 190 mg/dL — ABNORMAL HIGH (ref 0–149)
VLDL Cholesterol Cal: 32 mg/dL (ref 5–40)

## 2023-06-27 LAB — CBC
Hematocrit: 43.4 % (ref 34.0–46.6)
Hemoglobin: 13.8 g/dL (ref 11.1–15.9)
MCH: 28.7 pg (ref 26.6–33.0)
MCHC: 31.8 g/dL (ref 31.5–35.7)
MCV: 90 fL (ref 79–97)
Platelets: 103 10*3/uL — ABNORMAL LOW (ref 150–450)
RBC: 4.81 x10E6/uL (ref 3.77–5.28)
RDW: 12.7 % (ref 11.7–15.4)
WBC: 7.9 10*3/uL (ref 3.4–10.8)

## 2023-06-27 LAB — MICROALBUMIN / CREATININE URINE RATIO
Creatinine, Urine: 35.8 mg/dL
Microalb/Creat Ratio: 19 mg/g{creat} (ref 0–29)
Microalbumin, Urine: 6.9 ug/mL

## 2023-06-28 ENCOUNTER — Ambulatory Visit: Payer: Self-pay | Admitting: Family Medicine

## 2023-07-03 ENCOUNTER — Encounter (INDEPENDENT_AMBULATORY_CARE_PROVIDER_SITE_OTHER): Payer: Self-pay

## 2023-07-04 ENCOUNTER — Other Ambulatory Visit: Payer: Self-pay

## 2023-07-04 NOTE — Progress Notes (Signed)
 Specialty Pharmacy Refill Coordination Note  Julia Garner is a 73 y.o. female contacted today regarding refills of specialty medication(s) Dupilumab  (Dupixent )   Patient requested Delivery   Delivery date: 07/05/23   Verified address: [address] [place] [address]   Medication will be filled on 07/04/23.

## 2023-07-15 ENCOUNTER — Other Ambulatory Visit: Payer: Self-pay

## 2023-07-15 ENCOUNTER — Ambulatory Visit: Admitting: Allergy & Immunology

## 2023-07-15 VITALS — BP 126/78 | HR 76 | Temp 98.1°F | Resp 18 | Ht 60.24 in | Wt 156.0 lb

## 2023-07-15 DIAGNOSIS — B999 Unspecified infectious disease: Secondary | ICD-10-CM

## 2023-07-15 DIAGNOSIS — J302 Other seasonal allergic rhinitis: Secondary | ICD-10-CM

## 2023-07-15 DIAGNOSIS — L508 Other urticaria: Secondary | ICD-10-CM

## 2023-07-15 DIAGNOSIS — J4520 Mild intermittent asthma, uncomplicated: Secondary | ICD-10-CM | POA: Diagnosis not present

## 2023-07-15 MED ORDER — LEVOCETIRIZINE DIHYDROCHLORIDE 5 MG PO TABS: 5.0000 mg | ORAL_TABLET | Freq: Every evening | ORAL | 3 refills | Status: AC

## 2023-07-15 MED ORDER — FLUTICASONE PROPIONATE 50 MCG/ACT NA SUSP: 2.0000 | Freq: Every day | NASAL | 3 refills | Status: AC

## 2023-07-15 MED ORDER — EPINASTINE HCL 0.05 % OP SOLN: 1.0000 [drp] | Freq: Two times a day (BID) | OPHTHALMIC | 3 refills | Status: AC

## 2023-07-15 NOTE — Progress Notes (Unsigned)
 FOLLOW UP  Date of Service/Encounter:  07/15/23   Assessment:   Chronic urticaria - controlled with Xolair  monthly, but with some breakthrough episodes   New onset rash - ? 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   Subjective:   Makayli Vantine is a 73 y.o. female presenting today for follow up of  Chief Complaint  Patient presents with   Follow-up    Pt reports that  Dupixent  is not helping her. Pt reports having 3 doses of the dupixent . Not sure if it is helping her or not. Pt reports that she is having joint pain more frequently.    Bronnie Monterosso has a history of the following: Patient Active Problem List   Diagnosis Date Noted   Hypocalcemia 01/29/2022   Hypokalemia 07/24/2021   Connective tissue disease (HCC) 07/20/2021   Hyperplastic colon polyp    SVT (supraventricular tachycardia) (HCC) 06/08/2020   Aortic atherosclerosis (HCC) 11/04/2018   Fatty liver 11/04/2018   Chronic urticaria 08/15/2018   Diabetes mellitus without complication (HCC) 09/15/2017   Other seasonal allergic rhinitis 06/29/2014   Insomnia 06/29/2014   Oral aphthae 07/08/2007   Dyslipidemia 06/11/2007   Arthropathy of hand 01/25/2007   Fam hx-ischem heart disease 01/21/2007   Chronic ITP (idiopathic thrombocytopenia) (HCC) 10/08/2005   Obstructive sleep apnea of adult 10/24/2004   Essential (primary) hypertension 01/16/2004   Mood disorder (HCC) 01/15/2002    History obtained from: chart review and {Persons; PED relatives w/patient:19415::patient}.  Discussed the use of AI scribe  software for clinical note transcription with the patient and/or guardian, who gave verbal consent to proceed.  Vyla is a 74 y.o. female presenting for {Blank single:19197::a food challenge,a drug challenge,skin testing,a sick visit,an evaluation of ***,a follow up visit}.  Asthma/Respiratory Symptom History: ***  Allergic Rhinitis Symptom History: ***  Food Allergy Symptom History: ***  Skin Symptom History: ***  GERD Symptom History: ***  Infection Symptom History: ***  Otherwise, there have been no changes to her past medical history, surgical history, family history, or social history.    Review of systems otherwise negative other than that mentioned in the HPI.    Objective:   Blood pressure 126/78, pulse 76, temperature 98.1 F (36.7 C), temperature source Temporal, resp. rate 18, height 5' 0.24 (1.53 m), weight 156 lb (70.8 kg), SpO2 97%. Body mass index is 30.23 kg/m.    Physical Exam   Diagnostic studies: {Blank single:19197::none,deferred due to recent antihistamine use,deferred due to insurance stipulations that require a separate visit for testing,labs sent instead, }  Spirometry: {Blank single:19197::results normal (FEV1: ***%, FVC: ***%, FEV1/FVC: ***%),results abnormal (FEV1: ***%, FVC: ***%, FEV1/FVC: ***%)}.    {Blank single:19197::Spirometry consistent with mild obstructive disease,Spirometry consistent with moderate obstructive disease,Spirometry consistent with severe obstructive disease,Spirometry consistent with possible restrictive disease,Spirometry consistent with mixed obstructive and restrictive disease,Spirometry uninterpretable due to technique,Spirometry consistent with normal pattern}. {Blank single:19197::Albuterol /Atrovent nebulizer,Xopenex/Atrovent nebulizer,Albuterol  nebulizer,Albuterol  four puffs via MDI,Xopenex four puffs via MDI} treatment given in clinic with {Blank  single:19197::significant improvement in FEV1 per ATS criteria,significant improvement in FVC per ATS criteria,significant improvement in FEV1 and FVC per ATS  criteria,improvement in FEV1, but not significant per ATS criteria,improvement in FVC, but not significant per ATS criteria,improvement in FEV1 and FVC, but not significant per ATS criteria,no improvement}.  Allergy Studies: {Blank single:19197::none,deferred due to recent antihistamine use,deferred due to insurance stipulations that require a separate visit for testing,labs sent instead, }    {Blank single:19197::Allergy testing results were read and interpreted by myself, documented by clinical staff., }      Marty Shaggy, MD  [facility] 

## 2023-07-15 NOTE — Patient Instructions (Addendum)
 1. Hives - We are going to get the most common foods AND pepper via the blood. - We will work on getting the Xolair  back on board.  - You seem to have everything under control.  2. Intermittent asthma, uncomplicated  - Continue with albuterol  as needed.  - Lung testing looked good today.  3. Seasonal allergies (trees)  - This is prime tree season. - Continue with epinastine.  - Continue with  Xyzal  5mg  daily to see if this is better than the other antihistamines.   4. Return in about 6 months (around 01/14/2024). You can have the follow up appointment with Dr. Iva or a Nurse Practicioner (our Nurse Practitioners are excellent and always have Physician oversight!).    Please inform us  of any Emergency Department visits, hospitalizations, or changes in symptoms. Call us  before going to the ED for breathing or allergy symptoms since we might be able to fit you in for a sick visit. Feel free to contact us  anytime with any questions, problems, or concerns.  It was a pleasure to see you again today!  Websites that have reliable patient information: 1. American Academy of Asthma, Allergy, and Immunology: www.aaaai.org 2. Food Allergy Research and Education (FARE): foodallergy.org 3. Mothers of Asthmatics: http://www.asthmacommunitynetwork.org 4. American College of Allergy, Asthma, and Immunology: www.acaai.org      "Like" us  on Facebook and Instagram for our latest updates!      A healthy democracy works best when [employer] participate! Make sure you are registered to vote! If you have moved or changed any of your contact information, you will need to get this updated before voting! Scan the QR codes below to learn more!

## 2023-07-16 ENCOUNTER — Encounter: Payer: Self-pay | Admitting: Allergy & Immunology

## 2023-07-19 LAB — ALLERGEN SOYBEAN: Soybean IgE: <0.1 kU/L

## 2023-07-19 LAB — ALLERGENS W/COMP RFLX AREA 2
Alternaria Alternata IgE: <0.1 kU/L
Aspergillus Fumigatus IgE: <0.1 kU/L
Bermuda Grass IgE: <0.1 kU/L
Cedar, Mountain IgE: <0.1 kU/L
Cladosporium Herbarum IgE: <0.1 kU/L
Cockroach, German IgE: <0.1 kU/L
Common Silver Birch IgE: 0.7 kU/L — AB
Cottonwood IgE: <0.1 kU/L
D Farinae IgE: <0.1 kU/L
D Pteronyssinus IgE: <0.1 kU/L
E001-IgE Cat Dander: <0.1 kU/L
E005-IgE Dog Dander: <0.1 kU/L
Elm, American IgE: <0.1 kU/L
IgE (Immunoglobulin E), Serum: 48 [IU]/mL (ref 6–495)
Johnson Grass IgE: <0.1 kU/L
Maple/Box Elder IgE: <0.1 kU/L
Mouse Urine IgE: <0.1 kU/L
Oak, White IgE: 0.22 kU/L — AB
Pecan, Hickory IgE: <0.1 kU/L
Penicillium Chrysogen IgE: <0.1 kU/L
Pigweed, Rough IgE: <0.1 kU/L
Ragweed, Short IgE: <0.1 kU/L
Sheep Sorrel IgE Qn: <0.1 kU/L
Timothy Grass IgE: <0.1 kU/L
White Mulberry IgE: <0.1 kU/L

## 2023-07-19 LAB — ALLERGY PANEL 19, SEAFOOD GROUP
Allergen Salmon IgE: <0.1 kU/L
Catfish: <0.1 kU/L
Codfish IgE: <0.1 kU/L
F023-IgE Crab: <0.1 kU/L
F080-IgE Lobster: <0.1 kU/L
Shrimp IgE: <0.1 kU/L
Tuna: <0.1 kU/L

## 2023-07-19 LAB — ALLERGY PANEL 18, NUT MIX GROUP
Allergen Coconut IgE: <0.1 kU/L
F020-IgE Almond: <0.1 kU/L
F202-IgE Cashew Nut: <0.1 kU/L
Hazelnut (Filbert) IgE: 0.33 kU/L — AB
Peanut IgE: <0.1 kU/L
Pecan Nut IgE: <0.1 kU/L
Sesame Seed IgE: <0.1 kU/L

## 2023-07-19 LAB — F245-IGE EGG, WHOLE: Egg, Whole IgE: 0.23 kU/L — AB

## 2023-07-19 LAB — ALLERGEN MILK: Milk IgE: <0.1 kU/L

## 2023-07-19 LAB — ALLERGEN, WHEAT, F4: Wheat IgE: <0.1 kU/L

## 2023-07-19 LAB — ALLERGEN, CUMIN SEED: F265-IgE Cumin: <0.1 kU/L

## 2023-07-19 LAB — ALLERGEN,GRN PEPPER,PAPRIKA,F218: Paprika IgE: <0.1 kU/L

## 2023-07-19 LAB — ALLERGEN, BLACK PEPPER,F280: Allergen Black Pepper IgE: <0.1 kU/L

## 2023-07-22 ENCOUNTER — Other Ambulatory Visit: Payer: Self-pay

## 2023-07-22 ENCOUNTER — Encounter: Payer: Self-pay | Admitting: Allergy & Immunology

## 2023-07-22 ENCOUNTER — Ambulatory Visit: Payer: Self-pay | Admitting: Allergy & Immunology

## 2023-07-22 ENCOUNTER — Other Ambulatory Visit: Payer: Self-pay | Admitting: Pharmacy Technician

## 2023-07-22 ENCOUNTER — Encounter (INDEPENDENT_AMBULATORY_CARE_PROVIDER_SITE_OTHER): Payer: Self-pay

## 2023-07-22 NOTE — Progress Notes (Signed)
 Specialty Pharmacy Refill Coordination Note  Julia Garner is a 73 y.o. female contacted today regarding refills of specialty medication(s) Dupilumab  (Dupixent )   Patient requested (Patient-Rptd) Delivery   Delivery date: 08/06/23 Verified address: (Patient-Rptd) [address] [place] [address]   Medication will be filled on 08/05/23.

## 2023-08-05 ENCOUNTER — Other Ambulatory Visit: Payer: Self-pay

## 2023-08-07 ENCOUNTER — Telehealth: Payer: Self-pay | Admitting: *Deleted

## 2023-08-07 ENCOUNTER — Other Ambulatory Visit: Payer: Self-pay

## 2023-08-07 ENCOUNTER — Other Ambulatory Visit (HOSPITAL_COMMUNITY): Payer: Self-pay

## 2023-08-07 MED ORDER — XOLAIR 300 MG/2ML ~~LOC~~ SOSY
300.0000 mg | PREFILLED_SYRINGE | SUBCUTANEOUS | 11 refills | Status: AC
  Filled 2023-08-08: qty 4, 28d supply, fill #0
  Filled 2023-08-30: qty 4, 28d supply, fill #1
  Filled 2023-09-25 – 2023-09-30 (×2): qty 4, 28d supply, fill #2
  Filled 2023-10-23: qty 4, 28d supply, fill #3
  Filled 2023-11-14 – 2023-11-25 (×3): qty 4, 28d supply, fill #4
  Filled 2023-12-18 – 2023-12-23 (×2): qty 4, 28d supply, fill #5
  Filled 2024-01-15: qty 4, 28d supply, fill #6
  Filled 2024-02-13: qty 4, 28d supply, fill #7

## 2023-08-07 NOTE — Telephone Encounter (Signed)
 Spoke to patient advised approval and submit to [facility] for the Xolair  300mg  every 14 days. She is going to come by clinic to p/u a Xolair  300mg  dose and bring the dupixent  she just received to put into our samples since she cannot use same due to side effects.

## 2023-08-07 NOTE — Telephone Encounter (Signed)
 Patient called and advised Dr Iva wanted her to restart Xolair  but I had not gotten that info. Will get approval for Xolair  and d/c Dupixent 

## 2023-08-08 ENCOUNTER — Other Ambulatory Visit: Payer: Self-pay

## 2023-08-08 NOTE — Progress Notes (Signed)
 Specialty Pharmacy Initial Fill Coordination Note  Julia Garner is a 73 y.o. female contacted today regarding initial fill of specialty medication(s) Omalizumab  (Xolair )   Patient requested Delivery   Delivery date: 08/20/23   Verified address: [address]   [place] [address]   Medication will be filled on 08/04.   Patient is aware of $0.00 copayment.

## 2023-08-08 NOTE — Progress Notes (Signed)
  Specialty Pharmacy Initial Fill Coordination Note   Julia Garner is a 73 y.o. female contacted today regarding initial fill of specialty medication(s) Omalizumab  (Xolair )     Patient requested Delivery   Delivery date: 08/20/23   Verified address: [address]   [place] [address]     Medication will be filled on 08/04.    Patient is aware of $0.00 copayment.

## 2023-08-08 NOTE — Progress Notes (Signed)
 Specialty Pharmacy Initiation Note   Julia Garner is a 73 y.o. female who will be followed by the specialty pharmacy service for RxSp Allergy     Review of administration, indication, effectiveness, safety, potential side effects, storage/disposable, and missed dose instructions occurred today for patient's specialty medication(s) Omalizumab  (Xolair )     Patient/Caregiver did not have any additional questions or concerns.   Patient's therapy is appropriate to: Initiate  Patient is restarting therapy, she has used in the past with no issues.    Goals Addressed             This Visit's Progress    Minimize recurrence of flares       Patient is initiating therapy. Patient will maintain adherence         We will continue with annual follow-up, and the patient was encouraged to reach out with any questions or concerns in the meantime.  Silvano LOISE Dolly Specialty Pharmacist

## 2023-08-12 ENCOUNTER — Other Ambulatory Visit: Payer: Self-pay

## 2023-08-12 NOTE — Progress Notes (Signed)
 Patient left sample out of the refrigerator for 6 days and requested that refill be sent earlier than originally scheduled. Will ship 7/28 for delivery on 7/29.

## 2023-08-13 NOTE — Telephone Encounter (Signed)
 Sorry I dropped the ball on this one.

## 2023-08-16 MED ORDER — CLOBETASOL PROPIONATE 0.05 % EX OINT: 1.0000 | TOPICAL_OINTMENT | Freq: Two times a day (BID) | CUTANEOUS | 2 refills | Status: AC

## 2023-08-16 NOTE — Addendum Note (Signed)
 Addended by: IVA MARTY SALTNESS on: 08/16/2023 04:20 PM   Modules accepted: Orders

## 2023-08-29 ENCOUNTER — Other Ambulatory Visit: Payer: Self-pay

## 2023-08-30 ENCOUNTER — Encounter (INDEPENDENT_AMBULATORY_CARE_PROVIDER_SITE_OTHER): Payer: Self-pay

## 2023-08-30 ENCOUNTER — Other Ambulatory Visit (HOSPITAL_COMMUNITY): Payer: Self-pay

## 2023-08-30 ENCOUNTER — Other Ambulatory Visit: Payer: Self-pay

## 2023-08-30 NOTE — Progress Notes (Signed)
 Specialty Pharmacy Refill Coordination Note  MyChart Questionnaire Submission  Julia Garner is a 73 y.o. female contacted today regarding refills of specialty medication(s) Xolair .  Doses on hand: (Patient-Rptd) None   Injection date: (Patient-Rptd) 09/11/23  Patient requested: (Patient-Rptd) Delivery   Delivery date: 09/03/23  Verified address: [address] [place] [address]  Medication will be filled on 09/02/23.

## 2023-09-02 ENCOUNTER — Other Ambulatory Visit: Payer: Self-pay

## 2023-09-13 ENCOUNTER — Other Ambulatory Visit: Payer: Self-pay | Admitting: Family Medicine

## 2023-09-13 DIAGNOSIS — E119 Type 2 diabetes mellitus without complications: Secondary | ICD-10-CM

## 2023-09-13 DIAGNOSIS — K760 Fatty (change of) liver, not elsewhere classified: Secondary | ICD-10-CM

## 2023-09-24 ENCOUNTER — Ambulatory Visit: Admitting: Allergy & Immunology

## 2023-09-25 ENCOUNTER — Other Ambulatory Visit: Payer: Self-pay

## 2023-09-30 ENCOUNTER — Other Ambulatory Visit: Payer: Self-pay

## 2023-09-30 ENCOUNTER — Encounter (INDEPENDENT_AMBULATORY_CARE_PROVIDER_SITE_OTHER): Payer: Self-pay

## 2023-09-30 ENCOUNTER — Other Ambulatory Visit: Payer: Self-pay | Admitting: Pharmacy Technician

## 2023-09-30 NOTE — Progress Notes (Signed)
 Specialty Pharmacy Refill Coordination Note  Julia Garner is a 73 y.o. female contacted today regarding refills of specialty medication(s) Omalizumab  (Xolair )   Patient requested Delivery   Delivery date: 10/03/23   Verified address: [address] [place] [id number]   Medication will be filled on 10/02/23.  Injection due on 10/09/23.  Questionnaire answered.

## 2023-10-02 ENCOUNTER — Other Ambulatory Visit: Payer: Self-pay

## 2023-10-23 ENCOUNTER — Other Ambulatory Visit (HOSPITAL_COMMUNITY): Payer: Self-pay

## 2023-10-23 ENCOUNTER — Encounter (INDEPENDENT_AMBULATORY_CARE_PROVIDER_SITE_OTHER): Payer: Self-pay

## 2023-10-23 ENCOUNTER — Other Ambulatory Visit: Payer: Self-pay

## 2023-10-23 NOTE — Progress Notes (Signed)
 Specialty Pharmacy Refill Coordination Note  MyChart Questionnaire Submission  Julia Garner is a 73 y.o. female contacted today regarding refills of specialty medication(s) Xolair .  Doses on hand: (Patient-Rptd) None   Injection date: (Patient-Rptd) 11/06/23  Patient requested: (Patient-Rptd) Delivery   Delivery date: 10/25/23  Verified address: [address] [place] [address]  Medication will be filled on 10/24/23.

## 2023-10-24 ENCOUNTER — Other Ambulatory Visit: Payer: Self-pay

## 2023-10-25 ENCOUNTER — Other Ambulatory Visit: Payer: Self-pay | Admitting: Medical Genetics

## 2023-10-25 ENCOUNTER — Ambulatory Visit: Admitting: Family Medicine

## 2023-10-28 ENCOUNTER — Other Ambulatory Visit
Admission: RE | Admit: 2023-10-28 | Discharge: 2023-10-28 | Disposition: A | Discharge status: Home / Self Care | Source: Ambulatory Visit | Attending: Medical Genetics | Admitting: Medical Genetics

## 2023-11-04 ENCOUNTER — Encounter: Payer: Self-pay | Admitting: Family Medicine

## 2023-11-04 ENCOUNTER — Ambulatory Visit: Admitting: Family Medicine

## 2023-11-04 VITALS — BP 146/73 | HR 74 | Ht 61.0 in | Wt 155.8 lb

## 2023-11-04 DIAGNOSIS — R5383 Other fatigue: Secondary | ICD-10-CM | POA: Diagnosis not present

## 2023-11-04 DIAGNOSIS — E119 Type 2 diabetes mellitus without complications: Secondary | ICD-10-CM | POA: Diagnosis not present

## 2023-11-04 DIAGNOSIS — M791 Myalgia, unspecified site: Secondary | ICD-10-CM | POA: Diagnosis not present

## 2023-11-04 DIAGNOSIS — Z7985 Long-term (current) use of injectable non-insulin antidiabetic drugs: Secondary | ICD-10-CM | POA: Diagnosis not present

## 2023-11-04 LAB — POCT GLYCOSYLATED HEMOGLOBIN (HGB A1C)
Est. average glucose Bld gHb Est-mCnc: 123
Hemoglobin A1C: 5.9 % — AB (ref 4.0–5.6)

## 2023-11-04 NOTE — Patient Instructions (Signed)
 SABRA  Please review the attached list of medications and notify my office if there are any errors.   . Please bring all of your medications to every appointment so we can make sure that our medication list is the same as yours.

## 2023-11-04 NOTE — Progress Notes (Signed)
 Established patient visit   Patient: Julia Garner   DOB: 07-May-1950   73 y.o. Female  MRN: [id number] Visit Date: 11/04/2023  Today's healthcare provider: Nancyann Perry, MD   Chief Complaint  Patient presents with   Medical Management of Chronic Issues    DM follow-up   Subjective    Discussed the use of AI scribe software for clinical note transcription with the patient, who gave verbal consent to proceed.  History of Present Illness   Julia Garner is a 73 year old female with diabetes who presents for follow-up on blood sugar management and medication concerns.  Her blood sugar levels have been stable, with home readings around 110 to 120 mg/dL. Her recent A1c is 5.9%, consistent with previous results. She is currently using Ozempic  but has not noticed any weight change and reports she is not eating extra things now. She mentions the high cost of Ozempic  due to it being a tier three medication, requiring additional insurance coverage. She has a supply of Ozempic  that will last until mid-December and is considering switching medications due to cost concerns, exploring alternatives that might be covered better by her insurance.  She experiences episodes of severe fatigue that occur suddenly and can last from one to eight days. During these episodes, she feels 'almost like dead' and is unable to perform any activities. There are no associated muscle aches or pains. She has increased her antidepressant dosage recently and is consulting with Dr. Arti Kapoor to investigate the cause of these fatigue episodes. Previous blood work, including sed rate and CRP, was normal about a year ago.  No stomach issues with Ozempic  use. During fatigue episodes, she reports having no energy but no muscle aches.     Lab Results  Component Value Date   HGBA1C 5.9 (A) 11/04/2023   HGBA1C 5.8 (A) 06/26/2023   HGBA1C 5.8 (A) 01/18/2023     Medications: Outpatient Medications Prior to  Visit  Medication Sig   sertraline  (ZOLOFT ) 50 MG tablet TAKE 2 TABLETS BY MOUTH DAILY (Patient taking differently: Take 100 mg by mouth daily.)   albuterol  (VENTOLIN  HFA) 108 (90 Base) MCG/ACT inhaler Inhale 1-2 puffs into the lungs every 6 (six) hours as needed for shortness of breath. (Patient not taking: Reported on 07/15/2023)   ascorbic acid  (VITAMIN C) 500 MG tablet Take 500 mg by mouth daily.   Blood Glucose Monitoring Suppl (ONE TOUCH ULTRA 2) w/Device KIT Use to check sugar daily for type 2 diabetes E11.9   Calcium Carbonate (CALCIUM 500 PO) Take 1 tablet by mouth daily.   clobetasol  ointment (TEMOVATE ) 0.05 % Apply 1 Application topically 2 (two) times daily. Try to use for only two weeks at a time.   ELDERBERRY PO Take by mouth.   Epinastine HCl 0.05 % ophthalmic solution Place 1 drop into both eyes 2 (two) times daily.   fluticasone  (FLONASE ) 50 MCG/ACT nasal spray Place 2 sprays into both nostrils daily.   glucose blood (ONETOUCH ULTRA) test strip Use to check sugar twice daily for type 2 diabetes E11.9   irbesartan  (AVAPRO ) 75 MG tablet TAKE 1 TABLET BY MOUTH AT  BEDTIME   Lancets (ONETOUCH ULTRASOFT) lancets Use to check sugar twice daily for type 2 diabetes E11.9   levocetirizine (XYZAL ) 5 MG tablet Take 1 tablet (5 mg total) by mouth every evening.   magnesium gluconate (MAGONATE) 500 MG tablet Take 500 mg by mouth daily.   Multiple Vitamin (MULTIVITAMIN) tablet Take  1 tablet by mouth daily.   Naproxen Sodium 220 MG CAPS Take by mouth daily as needed.   omalizumab  (XOLAIR ) 300 MG/2  ML prefilled syringe Inject 300 mg into the skin every 14 (fourteen) days.   Omega 3 1200 MG CAPS Take 2 capsules by mouth daily.   OZEMPIC , 1 MG/DOSE, 4 MG/3ML SOPN INJECT SUBCUTANEOUSLY 1 MG EVERY WEEK   simvastatin  (ZOCOR ) 20 MG tablet Take 1 tablet (20 mg total) by mouth at bedtime.   traZODone  (DESYREL ) 50 MG tablet TAKE 1 TO 2 TABLETS BY MOUTH AT  BEDTIME   Ubiquinol 200 MG CAPS Take by  mouth. CoQ10   No facility-administered medications prior to visit.        Objective    BP (!) 146/73 (BP Location: Left Arm, Patient Position: Sitting)   Pulse 74   Ht 5' 1 (1.549 m)   Wt 155 lb 12.8 oz (70.7 kg)   SpO2 100%   BMI 29.44 kg/m   Physical Exam   General: Appearance:    Well developed, well nourished female in no acute distress  Eyes:    PERRL, conjunctiva/corneas clear, EOM's intact       Lungs:     Clear to auscultation bilaterally, respirations unlabored  Heart:    Normal heart rate. Normal rhythm. No murmurs, rubs, or gallops.    MS:   All extremities are intact.    Neurologic:   Awake, alert, oriented x 3. No apparent focal neurological defect.         Results for orders placed or performed in visit on 11/04/23  POCT glycosylated hemoglobin (Hb A1C)  Result Value Ref Range   Hemoglobin A1C 5.9 (A) 4.0 - 5.6 %   Est. average glucose Bld gHb Est-mCnc 123      Assessment & Plan    1. Diabetes mellitus without complication (HCC) (Primary) Will change Ozempic  1mg  to Mounjaro 5mg  before next mail refill due in November.   2. Other fatigue  3. Myalgia  These symptoms occur intermittently and cause her to be bed found for several days. She has started seeing Dr. Chipper as she thinks it may be psychological, but wants to make sure there is not a physical problem causing her symptoms.   Will have following labs drawn next time the symptoms occur.   - Sed Rate (ESR) - C-reactive protein - TSH + free T4 - CK  Return in about 4 months (around 03/06/2024). For diabetes.      Nancyann Perry, MD  [facility] [phone] (phone) [phone] (fax)  [facility]

## 2023-11-08 LAB — GENECONNECT MOLECULAR SCREEN: Genetic Analysis Overall Interpretation: NEGATIVE

## 2023-11-12 ENCOUNTER — Telehealth: Payer: Self-pay | Admitting: Family Medicine

## 2023-11-12 DIAGNOSIS — E119 Type 2 diabetes mellitus without complications: Secondary | ICD-10-CM

## 2023-11-12 NOTE — Telephone Encounter (Signed)
 [facility] faxed refill request for the following medications:  Mounjaro pen    Please advise.

## 2023-11-12 NOTE — Telephone Encounter (Signed)
 Please review. Patient currently on Ozempic 

## 2023-11-13 MED ORDER — TIRZEPATIDE 5 MG/0.5ML ~~LOC~~ SOAJ: 5.0000 mg | SUBCUTANEOUS | 1 refills | Status: AC

## 2023-11-14 ENCOUNTER — Other Ambulatory Visit: Payer: Self-pay

## 2023-11-17 ENCOUNTER — Other Ambulatory Visit: Payer: Self-pay | Admitting: Family Medicine

## 2023-11-18 ENCOUNTER — Other Ambulatory Visit (HOSPITAL_COMMUNITY): Payer: Self-pay

## 2023-11-21 ENCOUNTER — Other Ambulatory Visit: Payer: Self-pay

## 2023-11-25 ENCOUNTER — Other Ambulatory Visit: Payer: Self-pay

## 2023-11-25 ENCOUNTER — Encounter (INDEPENDENT_AMBULATORY_CARE_PROVIDER_SITE_OTHER): Payer: Self-pay

## 2023-11-26 ENCOUNTER — Other Ambulatory Visit (HOSPITAL_COMMUNITY): Payer: Self-pay

## 2023-11-26 ENCOUNTER — Other Ambulatory Visit: Payer: Self-pay

## 2023-11-26 NOTE — Progress Notes (Signed)
 Specialty Pharmacy Refill Coordination Note  MyChart Questionnaire Submission  Julia Garner is a 73 y.o. female contacted today regarding refills of specialty medication(s) Xolair .  Doses on hand: (Patient-Rptd) None   Injection date: (Patient-Rptd) 12/04/23  Patient requested: (Patient-Rptd) Delivery   Delivery date: 11/28/23  Verified address: [address] [place] [address]  Medication will be filled on 11/27/23.

## 2023-11-26 NOTE — Progress Notes (Signed)
 Clinical Intervention Note  Clinical Intervention Notes: Patient reported starting Vraylar 1.5mg  at bedtime. No DDIs identified with Xolair    Clinical Intervention Outcomes: Prevention of an adverse drug event   Advertising Account Planner

## 2023-12-03 ENCOUNTER — Other Ambulatory Visit: Payer: Self-pay

## 2023-12-03 ENCOUNTER — Encounter: Payer: Self-pay | Admitting: Allergy & Immunology

## 2023-12-03 ENCOUNTER — Ambulatory Visit: Admitting: Allergy & Immunology

## 2023-12-03 VITALS — BP 124/80 | HR 84 | Temp 97.6°F | Resp 16 | Ht 60.98 in | Wt 153.9 lb

## 2023-12-03 DIAGNOSIS — L508 Other urticaria: Secondary | ICD-10-CM | POA: Diagnosis not present

## 2023-12-03 DIAGNOSIS — J302 Other seasonal allergic rhinitis: Secondary | ICD-10-CM

## 2023-12-03 DIAGNOSIS — J4520 Mild intermittent asthma, uncomplicated: Secondary | ICD-10-CM

## 2023-12-03 MED ORDER — CLOBETASOL PROP EMOLLIENT BASE 0.05 % EX CREA: 1.0000 | TOPICAL_CREAM | Freq: Two times a day (BID) | CUTANEOUS | 5 refills | Status: AC | PRN

## 2023-12-03 NOTE — Progress Notes (Signed)
 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 Hives - We are going to get the most common foods AND pepper via the blood. - Information provided on Rhapsido to see if we can get that approved. - Sample provided.  - Finish your two Xolair  injections. - Let us  know if that Rhapsido works better.  - This CAN cause low platelets, but only do once a day. - Next you week have a CBC, so we will see what your platelet count looks like. - MyChart me with an update.   2. Intermittent asthma, uncomplicated  - Continue with albuterol  as needed.  - Lung testing looked good today.  3. Seasonal allergies (trees)  - Continue with epinastine.  - Continue with  Xyzal  5mg  daily to see if this is better than the other antihistamines.   4. Return in about 3 months (around 03/04/2024). You can have the follow up appointment with Dr. Iva or a Nurse Practicioner (our Nurse Practitioners are excellent and always have Physician oversight!).    Subjective:   Julia Garner is a 73 y.o. female presenting today for follow up of  Chief Complaint  Patient presents with   Follow-up    She says she has sensitive skin on lower right leg since a month.     Julia Garner has a history of the following: Patient Active Problem List   Diagnosis Date Noted   Hypocalcemia 01/29/2022   Hypokalemia 07/24/2021   Connective tissue disease 07/20/2021   Hyperplastic colon polyp    SVT (supraventricular tachycardia) 06/08/2020   Aortic atherosclerosis 11/04/2018   Fatty liver 11/04/2018   Chronic urticaria 08/15/2018   Diabetes mellitus without complication (HCC) 09/15/2017   Other seasonal allergic rhinitis 06/29/2014   Insomnia 06/29/2014   Oral aphthae 07/08/2007   Dyslipidemia 06/11/2007   Arthropathy of hand 01/25/2007   Fam hx-ischem heart disease 01/21/2007   Chronic ITP (idiopathic thrombocytopenia) (HCC) 10/08/2005   Obstructive sleep apnea of adult 10/24/2004   Essential (primary) hypertension 01/16/2004   Mood disorder 01/15/2002    History obtained from: chart review and patient.  Discussed the use of AI scribe software for clinical note transcription with the patient and/or guardian, who gave verbal consent to proceed.  [name] is a 73 y.o. female presenting for a follow up visit.  She was last seen in June 2025.  At that time, we obtained labs to look the most common foods and pepper.  She was interested in getting Xolair  back on board.  Asthma was under good control with albuterol .  She continued to report symptoms of allergic rhinitis from trees.  She remained on Xyzal  5 mg daily.  Since last visit, she has done relatively well.  Asthma/Respiratory Symptom History:  She is doing well with the albuterol  as needed. Julia Garner's asthma has been well controlled. She has not required rescue medication, experienced nocturnal awakenings due to lower respiratory symptoms, nor have activities of daily living been limited. She has required no Emergency Department or Urgent Care visits for her asthma. She has required zero courses of systemic steroids for asthma exacerbations since the last visit. ACT score today is 25, indicating  excellent asthma symptom control.   Allergic Rhinitis Symptom History: Currently, she uses Xyzal , which effectively manages her symptoms, particularly sinus-related pain. Tree pollen season is the worst time of the year for her.   Skin Symptom History: She experiences constant pain and sensitivity in her legs, describing the area as 'super sensitive and painful' to touch, without visible redness or swelling. The pain does not interfere with her ability to walk or sleep. She has been using clobetasol  ointment, 0.05%, twice daily, but prefers a cream for better coverage as the ointment is nearly finished.  She has been treated with Xolair  and Dupixent . She is uncertain about the effectiveness of Xolair , while Dupixent  caused muscle pain and was not well tolerated. We had originally changed her to Dupixent  because she wanted to retest her food allergies, which necessitated her being off of Xolair  for 3 months. Testing was done and revealed negative food allergies.   She has a history of ITP and reported a platelet count of 130 in June. She describes her platelets as 'kind of lowish' and has previously received rituximab  treatment for ITP about four years ago. She is not on aspirin or blood thinners. No redness, swelling, or hives in the affected area and no issues with walking or sleeping due to leg pain.   Otherwise, there have been no changes to her past medical history, surgical history, family history, or social history.    Review of systems otherwise negative other than that mentioned in the HPI.    Objective:   Blood pressure 124/80, pulse 84, temperature 97.6 F (36.4 C), temperature source Temporal, resp. rate 16, height 5' 0.98 (1.549 m), weight 153 lb 14.4 oz (69.8 kg), SpO2 97%. Body mass index is 29.09 kg/m.    Physical Exam Vitals reviewed.  Constitutional:      Appearance: She is well-developed.     Comments: Very lovely person. Talkative.   HENT:     Head: Normocephalic  and atraumatic.     Right Ear: Tympanic membrane, ear canal and external ear normal.     Left Ear: Tympanic membrane, ear canal and external ear normal.     Nose: No nasal deformity, septal deviation, mucosal edema or rhinorrhea.     Right Turbinates: Enlarged, swollen and pale.     Left Turbinates: Enlarged, swollen and pale.     Right Sinus: No maxillary sinus tenderness or frontal sinus tenderness.     Left Sinus: No maxillary sinus tenderness or frontal sinus tenderness.     Comments: No polyps. No rhinorrhea.     Mouth/Throat:     Lips: Pink.     Mouth: Mucous membranes are moist. Mucous membranes are not pale and not dry.     Pharynx: Uvula midline.     Comments: Minimal cobblestoning. Eyes:     General: Lids are normal. Allergic shiner present.        Right eye: No discharge.        Left eye: No discharge.     Conjunctiva/sclera: Conjunctivae normal.     Right eye: Right conjunctiva  is not injected. No chemosis.    Left eye: Left conjunctiva is not injected. No chemosis.    Pupils: Pupils are equal, round, and reactive to light.  Cardiovascular:     Rate and Rhythm: Normal rate and regular rhythm.     Heart sounds: Normal heart sounds.  Pulmonary:     Effort: Pulmonary effort is normal. No tachypnea, accessory muscle usage or respiratory distress.     Breath sounds: Normal breath sounds. No wheezing, rhonchi or rales.     Comments: Moving air well in all lung fields.  Chest:     Chest wall: No tenderness.  Musculoskeletal:     Cervical back: Rigidity present.  Lymphadenopathy:     Cervical: No cervical adenopathy.  Skin:    General: Skin is cool.     Capillary Refill: Capillary refill takes less than 2 seconds.     Coloration: Skin is not pale.     Findings: No abrasion, erythema, petechiae or rash. Rash is not papular, urticarial or vesicular.     Comments: No urticarial lesions.  Skin is clear. She has some excoriations on her lower extremities.  Neurological:      Mental Status: She is alert.  Psychiatric:        Behavior: Behavior is cooperative.      Diagnostic studies:    Spirometry: results normal (FEV1: 1.74/95%, FVC: 2.28/98%, FEV1/FVC: 76%).    Spirometry consistent with normal pattern.    Allergy  Studies: she is having a CBC next week       Marty Shaggy, MD  [facility] 

## 2023-12-03 NOTE — Patient Instructions (Addendum)
 1. Hives - We are going to get the most common foods AND pepper via the blood. - Information provided on Rhapsido to see if we can get that approved. - Sample provided.  - Finish your two Xolair  injections. - Let us  know if that Rhapsido works better.  - This CAN cause low platelets, but only do once a day. - Next you week have a CBC, so we will see what your platelet count looks like. - MyChart me with an update.   2. Intermittent asthma, uncomplicated  - Continue with albuterol  as needed.  - Lung testing looked good today.  3. Seasonal allergies (trees)  - Continue with epinastine.  - Continue with  Xyzal  5mg  daily to see if this is better than the other antihistamines.   4. Return in about 3 months (around 03/04/2024). You can have the follow up appointment with Dr. Iva or a Nurse Practicioner (our Nurse Practitioners are excellent and always have Physician oversight!).    Please inform us  of any Emergency Department visits, hospitalizations, or changes in symptoms. Call us  before going to the ED for breathing or allergy  symptoms since we might be able to fit you in for a sick visit. Feel free to contact us  anytime with any questions, problems, or concerns.  It was a pleasure to see you again today!  Websites that have reliable patient information: 1. American Academy of Asthma, Allergy , and Immunology: www.aaaai.org 2. Food Allergy  Research and Education (FARE): foodallergy.org 3. Mothers of Asthmatics: http://www.asthmacommunitynetwork.org 4. American College of Allergy , Asthma, and Immunology: www.acaai.org      "Like" us  on Facebook and Instagram for our latest updates!      A healthy democracy works best when [employer] participate! Make sure you are registered to vote! If you have moved or changed any of your contact information, you will need to get this updated before voting! Scan the QR codes below to learn more!

## 2023-12-09 ENCOUNTER — Encounter: Payer: Self-pay | Admitting: Allergy & Immunology

## 2023-12-11 ENCOUNTER — Encounter: Payer: Self-pay | Admitting: Family Medicine

## 2023-12-11 ENCOUNTER — Ambulatory Visit (INDEPENDENT_AMBULATORY_CARE_PROVIDER_SITE_OTHER): Admitting: Family Medicine

## 2023-12-11 VITALS — BP 131/76 | HR 78 | Resp 16 | Wt 158.5 lb

## 2023-12-11 DIAGNOSIS — R5383 Other fatigue: Secondary | ICD-10-CM

## 2023-12-11 DIAGNOSIS — R413 Other amnesia: Secondary | ICD-10-CM

## 2023-12-11 DIAGNOSIS — M791 Myalgia, unspecified site: Secondary | ICD-10-CM | POA: Diagnosis not present

## 2023-12-11 DIAGNOSIS — D696 Thrombocytopenia, unspecified: Secondary | ICD-10-CM | POA: Diagnosis not present

## 2023-12-11 DIAGNOSIS — G4700 Insomnia, unspecified: Secondary | ICD-10-CM

## 2023-12-11 MED ORDER — DOXEPIN HCL 6 MG PO TABS: 6.0000 mg | ORAL_TABLET | Freq: Every day | ORAL | 1 refills | Status: DC

## 2023-12-11 NOTE — Progress Notes (Signed)
 Established patient visit   Patient: Julia Garner   DOB: 1950/03/13   73 y.o. Female  MRN: [id number] Visit Date: 12/11/2023  Today's healthcare provider: Nancyann Perry, MD   Chief Complaint  Patient presents with   Memory Loss    Patient would like referral place.   Subjective    Discussed the use of AI scribe software for clinical note transcription with the patient, who gave verbal consent to proceed.  History of Present Illness   Julia Garner is a 73 year old female who presents with memory issues and fatigue.  She has been experiencing memory issues, particularly with recent memory, frequently losing her phone, car keys, and glasses. She missed one out of three words during a memory test conducted by a nurse at home. She is unsure about her family history of memory issues or dementia, as her parents died young.  She has been experiencing fatigue for the past three to four days. She has not yet undergone any tests to determine the cause of her fatigue. She has been taking trazodone  for a couple of years at a dose of 100 mg, recently reduced to 50 mg. She is concerned about her vitamin D  and iron levels, as she has not had these tested recently. Her B12 levels were reported as normal, but she is unsure about her ferritin levels.  She mentions experiencing pain in her legs, specifically on the outside of her tibia and femur's ball, which lasted for two weeks before resolving. No restless leg symptoms are present.  She mentions a past diagnosis of connective tissue disease, which she is curious about, as it might relate to her ongoing fatigue and lack of energy.     Did have thyroid  functions, crp, and sed rate ordered at last visit in October, but has not yet had blood drawn.   Medications: Outpatient Medications Prior to Visit  Medication Sig Note   albuterol  (VENTOLIN  HFA) 108 (90 Base) MCG/ACT inhaler Inhale 1-2 puffs into the lungs every 6 (six) hours as  needed for shortness of breath.    ascorbic acid  (VITAMIN C) 500 MG tablet Take 500 mg by mouth daily.    Blood Glucose Monitoring Suppl (ONE TOUCH ULTRA 2) w/Device KIT Use to check sugar daily for type 2 diabetes E11.9    Calcium Carbonate (CALCIUM 500 PO) Take 1 tablet by mouth daily.    clobetasol  ointment (TEMOVATE ) 0.05 % Apply 1 Application topically 2 (two) times daily. Try to use for only two weeks at a time.    Clobetasol  Prop Emollient Base 0.05 % emollient cream Apply 1 Application topically 2 (two) times daily as needed.    Epinastine HCl 0.05 % ophthalmic solution Place 1 drop into both eyes 2 (two) times daily.    fluticasone  (FLONASE ) 50 MCG/ACT nasal spray Place 2 sprays into both nostrils daily.    glucose blood (ONETOUCH ULTRA) test strip Use to check sugar twice daily for type 2 diabetes E11.9    irbesartan  (AVAPRO ) 75 MG tablet TAKE 1 TABLET BY MOUTH AT  BEDTIME    Lancets (ONETOUCH ULTRASOFT) lancets Use to check sugar twice daily for type 2 diabetes E11.9    levocetirizine (XYZAL ) 5 MG tablet Take 1 tablet (5 mg total) by mouth every evening.    magnesium gluconate (MAGONATE) 500 MG tablet Take 500 mg by mouth daily.    Multiple Vitamin (MULTIVITAMIN) tablet Take 1 tablet by mouth daily.    Naproxen Sodium 220  MG CAPS Take by mouth daily as needed.    omalizumab  (XOLAIR ) 300 MG/2  ML prefilled syringe Inject 300 mg into the skin every 14 (fourteen) days.    Omega 3 1200 MG CAPS Take 2 capsules by mouth daily.    sertraline  (ZOLOFT ) 50 MG tablet TAKE 2 TABLETS BY MOUTH DAILY (Patient taking differently: Take 100 mg by mouth daily.)    simvastatin  (ZOCOR ) 20 MG tablet TAKE 1 TABLET BY MOUTH AT  BEDTIME    Ubiquinol 200 MG CAPS Take by mouth. CoQ10    [DISCONTINUED] traZODone  (DESYREL ) 50 MG tablet TAKE 1 TO 2 TABLETS BY MOUTH AT  BEDTIME 12/11/2023: fatigue   tirzepatide  (MOUNJARO ) 5 MG/0.5ML Pen Inject 5 mg into the skin once a week. Take in Place of semaglutide  (Ozempic )     [DISCONTINUED] ELDERBERRY PO Take by mouth.    No facility-administered medications prior to visit.   Review of Systems  Constitutional:  Negative for appetite change, chills, fatigue and fever.  Respiratory:  Negative for chest tightness and shortness of breath.   Cardiovascular:  Negative for chest pain and palpitations.  Gastrointestinal:  Negative for abdominal pain, nausea and vomiting.  Neurological:  Negative for dizziness and weakness.       Objective    BP 131/76 (BP Location: Right Arm, Patient Position: Sitting, Cuff Size: Normal)   Pulse 78   Resp 16   Wt 158 lb 8 oz (71.9 kg)   SpO2 98%   BMI 29.96 kg/m   Physical Exam   General: Appearance:    Well developed, well nourished female in no acute distress  Eyes:    PERRL, conjunctiva/corneas clear, EOM's intact       Lungs:     Clear to auscultation bilaterally, respirations unlabored  Heart:    Normal heart rate. Normal rhythm. No murmurs, rubs, or gallops.    MS:   All extremities are intact.    Neurologic:   Awake, alert, oriented x 3. No apparent focal neurological defect.          Assessment & Plan    1. Memory change (Primary)  - Ambulatory referral to Neurology  2. Thrombocytopenia - Platelets  3. Other fatigue  - VITAMIN D  25 Hydroxy (Vit-D Deficiency, Fractures) - Iron, TIBC and Ferritin Panel - TSH + free T4  4. Myalgia  - VITAMIN D  25 Hydroxy (Vit-D Deficiency, Fractures) - Iron, TIBC and Ferritin Panel - TSH + free T4 - Sed Rate (ESR) - C-reactive protein  5. Insomnia, unspecified type She is stopping trazodone  which she has taken for many years as she is concerned this may be contributing to fatigue and memory problems.   - Doxepin  HCl 6 MG TABS; Take 1 tablet (6 mg total) by mouth at bedtime.  Dispense: 30 tablet; Refill: 1      Nancyann Perry, MD  [facility] [phone] (phone) [phone] (fax)  [facility]

## 2023-12-12 LAB — IRON,TIBC AND FERRITIN PANEL
Ferritin: 117 ng/mL (ref 15–150)
Iron Saturation: 27 % (ref 15–55)
Iron: 66 ug/dL (ref 27–139)
Total Iron Binding Capacity: 249 ug/dL — ABNORMAL LOW (ref 250–450)
UIBC: 183 ug/dL (ref 118–369)

## 2023-12-12 LAB — TSH+FREE T4
Free T4: 1.28 ng/dL (ref 0.82–1.77)
TSH: 2.9 u[IU]/mL (ref 0.450–4.500)

## 2023-12-12 LAB — VITAMIN D 25 HYDROXY (VIT D DEFICIENCY, FRACTURES): Vit D, 25-Hydroxy: 40.7 ng/mL (ref 30.0–100.0)

## 2023-12-12 LAB — C-REACTIVE PROTEIN: CRP: <1 mg/L (ref 0–10)

## 2023-12-12 LAB — SEDIMENTATION RATE: Sed Rate: 40 mm/h (ref 0–40)

## 2023-12-17 ENCOUNTER — Ambulatory Visit: Payer: Self-pay | Admitting: Family Medicine

## 2023-12-18 ENCOUNTER — Other Ambulatory Visit: Payer: Self-pay

## 2023-12-18 ENCOUNTER — Telehealth: Payer: Self-pay

## 2023-12-18 NOTE — Telephone Encounter (Signed)
 Pharmacy Quality Measure Review  This patient is appearing on a report for being at risk of failing the adherence measure for cholesterol (statin) medications this calendar year.   Medication: simvastatin  Last fill date: 12/17/23 for 100 day supply  Insurance report was not up to date. No action needed at this time.   Nijae Doyel E. Marsh, PharmD, CPP Clinical Pharmacist [facility] [phone]

## 2023-12-19 ENCOUNTER — Encounter: Payer: Self-pay | Admitting: Family Medicine

## 2023-12-19 ENCOUNTER — Telehealth: Payer: Self-pay | Admitting: Pharmacy Technician

## 2023-12-19 ENCOUNTER — Other Ambulatory Visit (HOSPITAL_COMMUNITY): Payer: Self-pay

## 2023-12-19 ENCOUNTER — Other Ambulatory Visit: Payer: Self-pay

## 2023-12-19 DIAGNOSIS — G4700 Insomnia, unspecified: Secondary | ICD-10-CM

## 2023-12-19 NOTE — Telephone Encounter (Signed)
 Pharmacy Patient Advocate Encounter   Received notification from Patient Advice Request messages that prior authorization for Doxepin  is required/requested.   Insurance verification completed.   The patient is insured through [employer].   Per test claim:  ZOLPIDEM TABS;ESZOPICLONE;ZALEPLON;RAMELTEON;BELSOMRA is preferred by the insurance.  If suggested medication is appropriate, Please send in a new RX and discontinue this one. If not, please advise as to why it's not appropriate so that we may request a Prior Authorization. Please note, some preferred medications may still require a PA.  If the suggested medications have not been trialed and there are no contraindications to their use, the PA will not be submitted, as it will not be approved.

## 2023-12-19 NOTE — Telephone Encounter (Signed)
 PA request has been Received. New Encounter has been or will be created for follow up. For additional info see Pharmacy Prior Auth telephone encounter from 12/19/23.

## 2023-12-23 ENCOUNTER — Encounter: Payer: Self-pay | Admitting: Family Medicine

## 2023-12-23 ENCOUNTER — Other Ambulatory Visit: Payer: Self-pay

## 2023-12-23 MED ORDER — RAMELTEON 8 MG PO TABS: 8.0000 mg | ORAL_TABLET | Freq: Every day | ORAL | 1 refills | Status: AC

## 2023-12-24 ENCOUNTER — Other Ambulatory Visit (HOSPITAL_COMMUNITY): Payer: Self-pay

## 2023-12-24 ENCOUNTER — Other Ambulatory Visit: Payer: Self-pay

## 2023-12-24 NOTE — Progress Notes (Signed)
 Specialty Pharmacy Refill Coordination Note  MyChart Questionnaire Submission  Julia Garner is a 73 y.o. female contacted today regarding refills of specialty medication(s) Xolair   Doses on hand: (Patient-Rptd) None   Injection date: (Patient-Rptd) 01/01/24  Patient requested: (Patient-Rptd) Delivery   Delivery date: 12/27/23  Verified address: [address] [place] [phone]  Medication will be filled on 12/26/23

## 2023-12-26 ENCOUNTER — Other Ambulatory Visit: Payer: Self-pay

## 2024-01-06 ENCOUNTER — Encounter: Payer: Self-pay | Admitting: Oncology

## 2024-01-14 ENCOUNTER — Ambulatory Visit: Admitting: Allergy & Immunology

## 2024-01-15 ENCOUNTER — Other Ambulatory Visit: Payer: Self-pay

## 2024-01-20 ENCOUNTER — Other Ambulatory Visit: Payer: Self-pay

## 2024-01-20 NOTE — Progress Notes (Signed)
 Specialty Pharmacy Refill Coordination Note  Julia Garner is a 74 y.o. female contacted today regarding refills of specialty medication(s) Omalizumab  (Xolair )   Patient requested Delivery   Delivery date: 01/24/24   Verified address: [address] [place] [phone]   Medication will be filled on: 01/23/24

## 2024-01-21 ENCOUNTER — Ambulatory Visit: Admitting: Allergy & Immunology

## 2024-01-23 ENCOUNTER — Other Ambulatory Visit: Payer: Self-pay

## 2024-01-29 NOTE — Progress Notes (Signed)
 [facility] Rheumatology   HPI  Reason for Consult: Connective Tissue Disease  Chief Complaint  Patient presents with   Connective Tissue Disease     I have been asked to see this patient in consultation by Dr.Kapur. I have reviewed the medical records from Dr.Kapur. Julia Garner is a 74 y.o. female is here today for evaluation of possible connective tissue disease who has medical history of asthma, eczema, hypertension, depression, insomnia. ITP and hyperlipidemia. She has pain of the PIP joints as well as her ulnar styloid. She denies swelling of the joints. She is able to form a fist. She does get pain of the knees. This is not constant. She denies any swelling.   She is followed by Dr.Yu regarding her ITP. She is currently not on any medication for the ITP. She was treated with rituximab  treatment.   The rash is off and on. She use to get rash in the sun but not any more. She denies any malar rash. She does complain of dry mouth.   She does have history 1st trimester miscarriage. She does have children. She does have history of paresthesia of the hands. She has history of carpal tunnel.   There is no family history of lupus or rheumatoid arthritis.   Patient denies fever, weight loss, night sweats, lymphadenopathy, alopecia, dry eyes, iritis/uveitis/scleritis, parotid swelling, malar rash/photosensitive rash/other skin rash, nasal/oral ulcers, hearing loss, dry cough, dyspnea, hemoptysis/epistaxis/recurrent sinus infection, difficulty swallowing, pleurisy, serositis, hematochezia/hematuria, seizures/stroke/DVT/PE/Raynaud's. ______________________________________________________________________    Current Outpatient Medications:    ascorbic acid , vitamin C, (VITAMIN C) 500 MG tablet, Take 500 mg by mouth once daily, Disp: , Rfl:    clobetasol -emollient 0.05 % Crea, Apply 1 Application topically 2 (two) times daily as needed, Disp: , Rfl:    coQ10, ubiquinol, 200 mg Cap, Take  by mouth, Disp: , Rfl:    famotidine  (PEPCID ) 40 MG tablet, Take 40 mg by mouth, Disp: , Rfl:    irbesartan  (AVAPRO ) 75 MG tablet, Take 75 mg by mouth at bedtime, Disp: , Rfl:    levocetirizine (XYZAL ) 5 MG tablet, Take 5 mg by mouth every evening, Disp: , Rfl:    loratadine -pseudoephedrine  (CLARITIN -D 24-HOUR) 10-240 mg ER tablet, Take 1 tablet by mouth once daily., Disp: , Rfl:    loteprednol etabonate (INVELTYS) 1 % DrpS, Place 1 drop into the left eye 2 (two) times daily Take for 2 weeks after surgery, Disp: 10 mL, Rfl: 0   magnesium gluconate (MAGONATE) 27.5 mg magne- sium (500 mg) tablet, Take 500 mg by mouth once daily, Disp: , Rfl:    multivitamin tablet, Take 1 tablet by mouth once daily, Disp: , Rfl:    naproxen sodium (ALEVE, ANAPROX) 220 MG tablet, Take 220 mg by mouth 2 (two) times daily with meals., Disp: , Rfl:    ofloxacin (OCUFLOX) 0.3 % ophthalmic solution, Place 1 drop into the right eye 2 (two) times daily, Disp: , Rfl:    omalizumab  (XOLAIR ) 300 mg/2 mL inj syringe, Inject 300 mg subcutaneously, Disp: , Rfl:    ramelteon  (ROZEREM ) 8 mg tablet, Take 8 mg by mouth, Disp: , Rfl:    sertraline  (ZOLOFT ) 50 MG tablet, Take 50 mg by mouth once daily., Disp: , Rfl:    simvastatin  (ZOCOR ) 5 MG tablet, Take 5 mg by mouth nightly., Disp: , Rfl:    tirzepatide  (MOUNJARO ) 5 mg/0.5 mL pen injector, Inject 5 mg subcutaneously, Disp: , Rfl:    traZODone  (DESYREL ) 50 MG tablet, Take 50  mg by mouth nightly., Disp: , Rfl:   Allergies  Allergen Reactions   Penicillin Swelling   Tylenol [Acetaminophen] Palpitations   Statins-Hmg-Coa Reductase Inhibitors Other (See Comments)    other    Past Medical History:  Diagnosis Date   Bleeding disorder ()    Depression (emotion)    GERD (gastroesophageal reflux disease)    H/O Bell's palsy    Hypercholesteremia    Hypertension    Idiopathic thrombocytopenic purpura (CMS/HHS-HCC)    Seizures (CMS/HHS-HCC)     Past  Surgical History:  Procedure Laterality Date   EXTRACTION CATARACT EXTRACAPSULAR W/INSERTION INTRAOCULAR PROSTHESIS Right 10/07/2017   Procedure: R- Lensx/ORA + Symfony Toric -EXTRACTION CATARACT EXTRACAPSULAR W/INSERTION INTRAOCULAR PROSTHESIS;  Surgeon: Luke Larve, MD;  Location: [facility] OR;  Service: Ophthalmology;  Laterality: Right;  LenSx/ORA/Symfony toric   EXTRACTION CATARACT EXTRACAPSULAR W/INSERTION INTRAOCULAR PROSTHESIS Left 10/24/2017   Procedure: L- Lensx/ORA + Symfony Toric EXTRACTION CATARACT EXTRACAPSULAR W/INSERTION INTRAOCULAR PROSTHESIS;  Surgeon: Luke Larve, MD;  Location: [facility] OR;  Service: Ophthalmology;  Laterality: Left;  LenSx/ORA/Symfony toric   CESAREAN SECTION     CHOLECYSTECTOMY     CHOLECYSTECTOMY      Family History  Problem Relation Name Age of Onset   Glaucoma Neg Hx     Macular degeneration Neg Hx      Social History   Tobacco Use   Smoking status: Never   Smokeless tobacco: Never  Substance Use Topics   Alcohol use: No    ______________________________________________________________________  Review of Systems:  Review of Systems  Constitutional:  Positive for fatigue.  HENT:  Positive for mouth sores. Negative for trouble swallowing.        Dry Mouth  Eyes:  Negative for redness.       Neg: Dry Eyes  Respiratory:  Negative for cough and shortness of breath.   Cardiovascular:  Negative for chest pain and leg swelling.  Gastrointestinal:  Negative for constipation, diarrhea and nausea.  Endocrine: Negative for cold intolerance and heat intolerance.  Genitourinary:  Negative for hematuria.  Musculoskeletal:        Per HPI  Skin:  Positive for rash. Negative for color change.  Neurological:  Positive for headaches. Negative for dizziness, weakness and numbness.  Hematological:  Does not bruise/bleed easily.  Psychiatric/Behavioral:  Positive for dysphoric mood and sleep disturbance. The patient is not nervous/anxious.   All other  systems reviewed and are negative.   Objective:  Vitals:   01/29/24 1503  BP: (!) 147/83  Pulse: 79  Temp: 36.3 C (97.4 F)  TempSrc: Temporal  Weight: 70.3 kg (155 lb)  Height: 154.9 cm (5' 1)  PainSc: 0-No pain     Length of Stiffness: Few Minutes  GEN - Pleasant, No Apparent Distress  HEENT - normocephalic and atraumatic. Conjunctiva Clear.  No Nasal/Oral Ulcer. Adequate Saliva Neck - supple with no adenopathy or thyromegaly.   C spine with full range of motion. Heart - regular rate and rhythm, No murmurs/gallops/rub, Nml S1S2 Lungs - clear to auscultation in all fields. Extremities - there is no cyanosis or edema. Neurological - alert and oriented.  Spine - no paraspinal tenderness; no lumbar spine tenderness Skin - no rashes observed MSK - The following joints were examined bilaterally: Hands, Wrists, Elbows, Shoulders, Metatarsals, Ankels, Knees and Hips; they were normal apart from what is noted.   100% Fist Formation Mild PIP Enlargement Crepitus of the knee No Synovitis or Dactylitis Strength 5/5 Proximal and Distal, Upper and Lower Extremity  No Tender Point ______________________________________________________________________ Labs/Imaging Reviewed in EMR CMP Cr 0.74, AST 19, ALT 21 CBC WBC 7.9, Hgb 13.8, Hct 43.4, Plt 103 (L) Vit D 40.7 TSH 2.9 HgA1c 5.9 ESR 40; CRP < 1 Neg: ANA   CT Chest (07/2021) 1. No filling defect is identified in the pulmonary arterial tree to  suggest pulmonary embolus.  2. Atherosclerosis including the thoracic aorta and coronary  arteries. Aortic Atherosclerosis (ICD10-I70.0). Mild cardiomegaly.  3. Airway thickening is present, suggesting bronchitis or reactive  airways disease. Multifocal airway plugging in both lower lobes.   Assessment and Plan    Diagnoses and all orders for this visit:  Chronic ITP (idiopathic thrombocytopenia) (CMS/HHS-HCC) -     Antinuclear Antibodies, IFA - [facility] -     Sjogren's Ab,  Anti-SS-A/-SS-B - [facility] -     Antiextractable Nuclear Ag - [facility] -     Rheumatoid Arthritis Factor - [facility] -     Anti-dsDNA Antibodies - [facility] -     Complement C3, Serum - [facility] -     Complement C4, Serum - [facility] -     Creatine Kinase (CK), Total  Polyarthralgia -     Antinuclear Antibodies, IFA - [facility] -     Sjogren's Ab, Anti-SS-A/-SS-B - [facility] -     Antiextractable Nuclear Ag - [facility] -     Rheumatoid Arthritis Factor - [facility] -     Anti-dsDNA Antibodies - [facility] -     Complement C3, Serum - [facility] -     Complement C4, Serum - [facility] -     Creatine Kinase (CK), Total   -- Her exam and history are not suggestive of connective tissue disease. She has no rashes, joint swelling, raynaud's, myositis or any other features of connective tissue disease.  -- She has mild osteoarthritis changes of the hands and some crepitus of the knee. This is not significant.  -- She has chronic ITP and is followed by Dr.Yu. -- I think it would be safe to say she does not have CTD and can be taken off the chart. -- I will check labs to better understand her concerns. She has negative ANA in 2020.    No follow-ups on file.  All new prescription medications, changes in current prescription dosages, and sample medications were discussed with the patient, including patient education, medication name, use, dosage, potential side effects, drug interactions, consequences of not using/taking, and special instructions.  Patient expressed understanding.  No barriers to adherence.   I appreciate the opportunity to participate in the care of [facility]. Please do not hesitate to contact me with any questions or concerns that may arise in regards to the patient's rheumatologic disease.   Attestation Statement:   I personally performed the service. (TP)  MAYUR LOREE BLANCH, MD

## 2024-01-30 ENCOUNTER — Other Ambulatory Visit: Payer: Self-pay

## 2024-01-30 ENCOUNTER — Encounter: Payer: Self-pay | Admitting: Oncology

## 2024-01-30 ENCOUNTER — Inpatient Hospital Stay: Payer: Medicare Other | Admitting: Oncology

## 2024-01-30 ENCOUNTER — Inpatient Hospital Stay: Payer: Medicare Other | Attending: Oncology

## 2024-01-30 VITALS — BP 144/71 | HR 72 | Temp 97.5°F | Resp 18 | Wt 153.9 lb

## 2024-01-30 DIAGNOSIS — D693 Immune thrombocytopenic purpura: Secondary | ICD-10-CM | POA: Diagnosis not present

## 2024-01-30 LAB — CBC WITH DIFFERENTIAL (CANCER CENTER ONLY)
Abs Immature Granulocytes: 0.02 K/uL (ref 0.00–0.07)
Basophils Absolute: 0 K/uL (ref 0.0–0.1)
Basophils Relative: 1 %
Eosinophils Absolute: 0.3 K/uL (ref 0.0–0.5)
Eosinophils Relative: 4 %
HCT: 41.8 % (ref 36.0–46.0)
Hemoglobin: 13.9 g/dL (ref 12.0–15.0)
Immature Granulocytes: 0 %
Lymphocytes Relative: 44 %
Lymphs Abs: 3.8 K/uL (ref 0.7–4.0)
MCH: 29 pg (ref 26.0–34.0)
MCHC: 33.3 g/dL (ref 30.0–36.0)
MCV: 87.3 fL (ref 80.0–100.0)
Monocytes Absolute: 0.7 K/uL (ref 0.1–1.0)
Monocytes Relative: 8 %
Neutro Abs: 3.6 K/uL (ref 1.7–7.7)
Neutrophils Relative %: 43 %
Platelet Count: 69 K/uL — ABNORMAL LOW (ref 150–400)
RBC: 4.79 MIL/uL (ref 3.87–5.11)
RDW: 12.9 % (ref 11.5–15.5)
WBC Count: 8.4 K/uL (ref 4.0–10.5)
nRBC: 0 % (ref 0.0–0.2)

## 2024-01-30 LAB — CMP (CANCER CENTER ONLY)
ALT: 19 U/L (ref 0–44)
AST: 21 U/L (ref 15–41)
Albumin: 4 g/dL (ref 3.5–5.0)
Alkaline Phosphatase: 60 U/L (ref 38–126)
Anion gap: 10 (ref 5–15)
BUN: 9 mg/dL (ref 8–23)
CO2: 26 mmol/L (ref 22–32)
Calcium: 9.4 mg/dL (ref 8.9–10.3)
Chloride: 106 mmol/L (ref 98–111)
Creatinine: 0.81 mg/dL (ref 0.44–1.00)
GFR, Estimated: >60 mL/min
Glucose, Bld: 106 mg/dL — ABNORMAL HIGH (ref 70–99)
Potassium: 3.7 mmol/L (ref 3.5–5.1)
Sodium: 142 mmol/L (ref 135–145)
Total Bilirubin: 0.5 mg/dL (ref 0.0–1.2)
Total Protein: 7.8 g/dL (ref 6.5–8.1)

## 2024-01-30 LAB — IMMATURE PLATELET FRACTION: Immature Platelet Fraction: 7.5 % (ref 1.2–8.6)

## 2024-01-30 NOTE — Progress Notes (Signed)
 " Hematology/Oncology Progress note [phone] [phone]    Patient Care Team: Gasper Nancyann BRAVO, MD as PCP - General (Family Medicine) Mevelyn JONETTA Bathe, OD (Optometry) Elige Adine Rush, MD as Referring Physician (Otolaryngology) Babara Call, MD as Consulting Physician (Oncology)  REASON FOR VISIT Follow up for thrombocytopenia  ASSESSMENT & PLAN:  Chronic ITP (idiopathic thrombocytopenia) [facility]) Previous has rituximab  treatments Labs are reviewed and discussed with patient. Platelet count has creased to 69. No bleeding events. Recommend close monitoring CBC monthly.   Orders Placed This Encounter  Procedures   CBC with Differential (Cancer Center Only)    Standing Status:   Future    Expected Date:   04/29/2024    Expiration Date:   07/28/2024   CMP (Cancer Center only)    Standing Status:   Future    Expected Date:   04/29/2024    Expiration Date:   07/28/2024   CBC with Differential (Cancer Center Only)    Standing Status:   Future    Expected Date:   02/13/2024    Expiration Date:   05/13/2024   CBC with Differential (Cancer Center Only)    Standing Status:   Future    Expected Date:   03/01/2024    Expiration Date:   05/30/2024   Hepatitis panel, acute    Standing Status:   Future    Expected Date:   03/01/2024    Expiration Date:   05/30/2024   Immature Platelet Fraction    Standing Status:   Future    Expected Date:   03/01/2024    Expiration Date:   05/30/2024   Immature Platelet Fraction    Standing Status:   Future    Number of Occurrences:   1    Expected Date:   01/30/2024    Expiration Date:   04/29/2024   Follow up per LOS All questions were answered. The patient knows to call the clinic with any problems, questions or concerns.  Call Babara, MD, PhD [facility] Hematology Oncology 01/30/2024     PERTINENT HEMATOLOGY HISTORY Merlina Marchena 74 y.o.  female who has a history of recurrent ITP. Before she was referred to me, she has a  long history of ITP which were treated with sterids by primary care physician and she used to respond to steroids. She was seen by anther Hematologist Dr.Pandit many years ago. Dr.Pandit has left [place].  She had labs doneon 08/22/2016. Platelet counts were slow at 15,000. She was treated with a short course (5 days) of Steroids by Dr.Fisher and platelet recovered to 120,000, however later dropped again to 11,000 on 09/10/2016.  I treated her with a longer course of prednisone  with slow tapering and her counts again responded to treatment. During the tapering the course,she visited home country [place] and had blood work done there after she finished the tapering course of Prednisone  there. She reports the platelet counts were normal in [place].   Patient has had lab work up including negative hepatitis, HIV, normal LDH, normal B12 and folate level. ANA was positive and she was evaluated by rheumatologist. Based on my phone discussion with Rheumatologist, patient has had rheumatology work up and was not considered to have any autoimmune problems. She does not have hepatosplenomegaly.   Patient returned to clinic in December and repeat blood work on 12/26/2016 showed platelet count of 16,000. Treated with a course of Dexamethasone  40mg  x 4 days, platelet again responded and dropped again.  And she reports  easy bruising, feeling tired.  Peripheral blood flowcytometry 1% of analyzed cells containing clonal B cell population which are CD5- and CD 10-.  There is mildly increased eosinophils 7%.   Patient does not have any palpable lymphadenopathy on physical examination and we obtained CT which showed no pathological lymphadenopathy. Bone marrow biopsy showed hypercellular marrow with trilineage hematopoiesis including increased megakaryocytes. No evidence of lymphoproliferative process involvement. Normal cytogenetics.  At this point patient has had extensive work up for her thrombocytopenia, and we  discussed in lengthy that she most likely have ITP, there maybe a low grade lymphoma too. Given that she has recurrent ITP and platelet counts cannot be maintained without steroids, I suggest Rituximab  weekly x 4 for consolidation. Patient has requested me to talk to her son in law Dr.Kamron who is a nephrologist in . I have talked to Dr.Kamron about the rationale of using Rituximab  and also the side effects profile. Dr.Kamron agrees the plan and has relayed information to patient. I have also discussed the side effects of Rituximab  with patient. She agrees with treatment.   # severe bilateral carpal tunnel syndrome and received joint steroid injections.  INTERVAL HISTORY Mercedes Morado is a 74 y.o. female who has above hematology history reviewed by me today presents for follow up visit for ITP.  Patient reports feeling well no new complaints.  No acute bleeding or bruising.   Review of Systems  Constitutional:  Negative for appetite change, chills, fatigue and fever.  HENT:   Negative for hearing loss and voice change.   Eyes:  Negative for eye problems.  Respiratory:  Negative for chest tightness and cough.   Cardiovascular:  Negative for chest pain.  Gastrointestinal:  Negative for abdominal distention, abdominal pain and blood in stool.  Endocrine: Negative for hot flashes.  Genitourinary:  Negative for difficulty urinating and frequency.   Musculoskeletal:  Negative for arthralgias.  Skin:  Negative for itching and rash.  Neurological:  Negative for extremity weakness.  Hematological:  Negative for adenopathy.  Psychiatric/Behavioral:  Negative for confusion.     MEDICAL HISTORY:  Past Medical History:  Diagnosis Date   Allergy     Chronic ITP (idiopathic thrombocytopenia) (HCC)    Chronic urticaria 08/15/2018   COVID 2023   Depression    Head injuries    History of seizure 2007   one seizure    Hypertension     SURGICAL HISTORY: Past Surgical History:  Procedure  Laterality Date   CATARACT EXTRACTION, BILATERAL  around 2021   Dr. Mevelyn   CESAREAN SECTION  1990   CHOLECYSTECTOMY  2005   COLONOSCOPY WITH PROPOFOL  N/A 12/20/2020   Procedure: COLONOSCOPY WITH PROPOFOL ;  Surgeon: Jinny Carmine, MD;  Location: [facility] ENDOSCOPY;  Service: Endoscopy;  Laterality: N/A;   CYST EXCISION  1996   Tracheal Cyst    SOCIAL HISTORY: Social History   Socioeconomic History   Marital status: Married    Spouse name: Not on file   Number of children: 3   Years of education: Not on file   Highest education level: Bachelor's degree (e.g., BA, AB, BS)  Occupational History   Occupation: retired    Comment: Adult Nurse  Tobacco Use   Smoking status: Never   Smokeless tobacco: Never  Vaping Use   Vaping status: Never Used  Substance and Sexual Activity   Alcohol use: No    Alcohol/week: 0.0 standard drinks of alcohol   Drug use: No   Sexual activity: Not on file  Other Topics Concern   Not on file  Social History Narrative   Not on file   Social Drivers of Health   Tobacco Use: Low Risk (01/30/2024)   Patient History    Smoking Tobacco Use: Never    Smokeless Tobacco Use: Never    Passive Exposure: Not on file  Financial Resource Strain: Low Risk  (01/29/2024)   Received from [facility]   Overall Financial Resource Strain (CARDIA)    Difficulty of Paying Living Expenses: Not hard at all  Food Insecurity: No Food Insecurity (01/29/2024)   Received from [facility]   Epic    Within the past 12 months, you worried that your food would run out before you got the money to buy more.: Never true    Within the past 12 months, the food you bought just didn't last and you didn't have money to get more.: Never true  Transportation Needs: No Transportation Needs (01/29/2024)   Received from [facility] - Transportation    In the past 12 months, has lack of transportation kept you from medical  appointments or from getting medications?: No    Lack of Transportation (Non-Medical): No  Physical Activity: Inactive (11/08/2021)   Exercise Vital Sign    Days of Exercise per Week: 0 days    Minutes of Exercise per Session: 0 min  Stress: No Stress Concern Present (01/14/2023)   [employer] of Occupational Health - Occupational Stress Questionnaire    Feeling of Stress : Only a little  Social Connections: Socially Integrated (01/14/2023)   Social Connection and Isolation Panel    Frequency of Communication with Friends and Family: More than three times a week    Frequency of Social Gatherings with Friends and Family: Once a week    Attends Religious Services: More than 4 times per year    Active Member of [employer] or Organizations: Yes    Attends Engineer, Structural: More than 4 times per year    Marital Status: Married  Catering Manager Violence: Not At Risk (11/08/2021)   Humiliation, Afraid, Rape, and Kick questionnaire    Fear of Current or Ex-Partner: No    Emotionally Abused: No    Physically Abused: No    Sexually Abused: No  Depression (PHQ2-9): Low Risk (12/11/2023)   Depression (PHQ2-9)    PHQ-2 Score: 0  Alcohol Screen: Low Risk (04/13/2022)   Alcohol Screen    Last Alcohol Screening Score (AUDIT): 0  Housing: Low Risk  (01/29/2024)   Received from [facility]   Epic    In the last 12 months, was there a time when you were not able to pay the mortgage or rent on time?: No    In the past 12 months, how many times have you moved where you were living?: 0    At any time in the past 12 months, were you homeless or living in a shelter (including now)?: No  Utilities: Not At Risk (01/29/2024)   Received from [facility]   Epic    In the past 12 months has the electric, gas, oil, or water company threatened to shut off services in your home?: No  Health Literacy: Not on file    FAMILY HISTORY: Family History  Problem  Relation Age of Onset   Heart attack Father        1st MI at age 41    ALLERGIES:  is allergic to niacin and related, penicillins, statins, and tylenol  [acetaminophen].  MEDICATIONS:  Current Outpatient Medications  Medication Sig Dispense Refill   albuterol  (VENTOLIN  HFA) 108 (90 Base) MCG/ACT inhaler Inhale 1-2 puffs into the lungs every 6 (six) hours as needed for shortness of breath. 1 each 2   ascorbic acid  (VITAMIN C) 500 MG tablet Take 500 mg by mouth daily.     Blood Glucose Monitoring Suppl (ONE TOUCH ULTRA 2) w/Device KIT Use to check sugar daily for type 2 diabetes E11.9 1 kit 0   Calcium Carbonate (CALCIUM 500 PO) Take 1 tablet by mouth daily.     clobetasol  ointment (TEMOVATE ) 0.05 % Apply 1 Application topically 2 (two) times daily. Try to use for only two weeks at a time. 30 g 2   Clobetasol  Prop Emollient Base 0.05 % emollient cream Apply 1 Application topically 2 (two) times daily as needed. 30 g 5   Epinastine HCl 0.05 % ophthalmic solution Place 1 drop into both eyes 2 (two) times daily. 15 mL 3   fluticasone  (FLONASE ) 50 MCG/ACT nasal spray Place 2 sprays into both nostrils daily. 48 g 3   glucose blood (ONETOUCH ULTRA) test strip Use to check sugar twice daily for type 2 diabetes E11.9 100 each 4   irbesartan  (AVAPRO ) 75 MG tablet TAKE 1 TABLET BY MOUTH AT  BEDTIME 90 tablet 4   Lancets (ONETOUCH ULTRASOFT) lancets Use to check sugar twice daily for type 2 diabetes E11.9 100 each 4   levocetirizine (XYZAL ) 5 MG tablet Take 1 tablet (5 mg total) by mouth every evening. 90 tablet 3   magnesium gluconate (MAGONATE) 500 MG tablet Take 500 mg by mouth daily.     Multiple Vitamin (MULTIVITAMIN) tablet Take 1 tablet by mouth daily.     Naproxen Sodium 220 MG CAPS Take by mouth daily as needed.     omalizumab  (XOLAIR ) 300 MG/2  ML prefilled syringe Inject 300 mg into the skin every 14 (fourteen) days. 4 mL 11   Omega 3 1200 MG CAPS Take 2 capsules by mouth daily.      ramelteon  (ROZEREM ) 8 MG tablet Take 1 tablet (8 mg total) by mouth at bedtime. For insomnia 30 tablet 1   sertraline  (ZOLOFT ) 50 MG tablet TAKE 2 TABLETS BY MOUTH DAILY (Patient taking differently: Take 100 mg by mouth daily.) 200 tablet 3   simvastatin  (ZOCOR ) 20 MG tablet TAKE 1 TABLET BY MOUTH AT  BEDTIME 100 tablet 3   tirzepatide  (MOUNJARO ) 5 MG/0.5ML Pen Inject 5 mg into the skin once a week. Take in Place of semaglutide  (Ozempic ) 6 mL 1   Ubiquinol 200 MG CAPS Take by mouth. CoQ10     No current facility-administered medications for this visit.      SABRA  PHYSICAL EXAMINATION: ECOG PERFORMANCE STATUS: 0 - Asymptomatic Vitals:   01/30/24 1406  BP: (!) 144/71  Pulse: 72  Resp: 18  Temp: (!) 97.5 F (36.4 C)  SpO2: 98%   Filed Weights   01/30/24 1406  Weight: 153 lb 14.4 oz (69.8 kg)  Physical Exam Constitutional:      General: She is not in acute distress.    Appearance: She is not diaphoretic.  HENT:     Head: Normocephalic and atraumatic.  Eyes:     General: No scleral icterus. Neck:     Vascular: No JVD.  Cardiovascular:     Rate and Rhythm: Normal rate and regular rhythm.  Pulmonary:  Effort: Pulmonary effort is normal. No respiratory distress.     Breath sounds: Normal breath sounds. No wheezing.  Abdominal:     General: Bowel sounds are normal. There is no distension.     Palpations: Abdomen is soft.  Musculoskeletal:        General: Normal range of motion.     Cervical back: Normal range of motion and neck supple.  Skin:    Findings: No rash.  Neurological:     Mental Status: She is alert and oriented to person, place, and time. Mental status is at baseline.     Cranial Nerves: No cranial nerve deficit.     Motor: No abnormal muscle tone.  Psychiatric:        Mood and Affect: Mood and affect normal.        Cognition and Memory: Memory normal.          LABORATORY DATA:  I have reviewed the data as listed Lab Results  Component Value Date    WBC 8.4 01/30/2024   HGB 13.9 01/30/2024   HCT 41.8 01/30/2024   MCV 87.3 01/30/2024   PLT 69 (L) 01/30/2024   Recent Labs    06/26/23 1012 01/30/24 1356  NA 140 142  K 4.1 3.7  CL 104 106  CO2 21 26  GLUCOSE 113* 106*  BUN 9 9  CREATININE 0.74 0.81  CALCIUM 8.7 9.4  GFRNONAA  --  >60  PROT 7.5 7.8  ALBUMIN 3.9 4.0  AST 19 21  ALT 21 19  ALKPHOS 64 60  BILITOT 0.3 0.5   RADIOGRAPHIC STUDIES: I have personally reviewed the radiological images as listed and agreed with the findings in the report. No results found.      "

## 2024-01-30 NOTE — Assessment & Plan Note (Signed)
 Previous has rituximab  treatments Labs are reviewed and discussed with patient. Platelet count has creased to 69. No bleeding events. Recommend close monitoring CBC monthly.

## 2024-02-11 ENCOUNTER — Encounter: Payer: Self-pay | Admitting: Family Medicine

## 2024-02-13 ENCOUNTER — Other Ambulatory Visit: Payer: Self-pay

## 2024-02-17 ENCOUNTER — Other Ambulatory Visit (HOSPITAL_COMMUNITY): Payer: Self-pay

## 2024-02-17 ENCOUNTER — Other Ambulatory Visit: Payer: Self-pay

## 2024-02-17 NOTE — Progress Notes (Signed)
 Specialty Pharmacy Refill Coordination Note  Julia Garner is a 74 y.o. female contacted today regarding refills of specialty medication(s) Omalizumab  (Xolair )   Patient requested Delivery   Delivery date: 02/21/24   Verified address: [address] [place] [phone]   Medication will be filled on: 02/20/24

## 2024-02-20 ENCOUNTER — Other Ambulatory Visit: Payer: Self-pay | Admitting: Family Medicine

## 2024-02-20 ENCOUNTER — Other Ambulatory Visit: Payer: Self-pay

## 2024-02-26 ENCOUNTER — Ambulatory Visit: Admitting: Family Medicine

## 2024-03-02 ENCOUNTER — Inpatient Hospital Stay

## 2024-03-03 ENCOUNTER — Inpatient Hospital Stay

## 2024-03-05 ENCOUNTER — Ambulatory Visit: Admitting: Allergy & Immunology

## 2024-03-30 ENCOUNTER — Inpatient Hospital Stay

## 2024-04-01 ENCOUNTER — Inpatient Hospital Stay

## 2024-05-05 ENCOUNTER — Inpatient Hospital Stay: Admitting: Oncology

## 2024-05-05 ENCOUNTER — Inpatient Hospital Stay

## 2024-05-13 ENCOUNTER — Ambulatory Visit: Admitting: Neurology
